# Patient Record
Sex: Male | Born: 1945 | Race: White | Hispanic: No | Marital: Married | Smoking: Never smoker
Health system: Southern US, Community
[De-identification: ages and names within clinical notes are randomized; demographics above are authoritative.]

## PROBLEM LIST (undated history)

## (undated) DIAGNOSIS — G8929 Other chronic pain: Secondary | ICD-10-CM

## (undated) DIAGNOSIS — G4733 Obstructive sleep apnea (adult) (pediatric): Secondary | ICD-10-CM

## (undated) DIAGNOSIS — R296 Repeated falls: Secondary | ICD-10-CM

## (undated) DIAGNOSIS — I440 Atrioventricular block, first degree: Secondary | ICD-10-CM

## (undated) DIAGNOSIS — R52 Pain, unspecified: Secondary | ICD-10-CM

## (undated) DIAGNOSIS — Z79891 Long term (current) use of opiate analgesic: Secondary | ICD-10-CM

## (undated) DIAGNOSIS — I10 Essential (primary) hypertension: Secondary | ICD-10-CM

## (undated) DIAGNOSIS — M450 Ankylosing spondylitis of multiple sites in spine: Secondary | ICD-10-CM

## (undated) DIAGNOSIS — R2681 Unsteadiness on feet: Secondary | ICD-10-CM

## (undated) DIAGNOSIS — G608 Other hereditary and idiopathic neuropathies: Secondary | ICD-10-CM

## (undated) DIAGNOSIS — T8859XA Other complications of anesthesia, initial encounter: Secondary | ICD-10-CM

## (undated) DIAGNOSIS — Z7289 Other problems related to lifestyle: Secondary | ICD-10-CM

## (undated) DIAGNOSIS — T4145XA Adverse effect of unspecified anesthetic, initial encounter: Secondary | ICD-10-CM

## (undated) DIAGNOSIS — R251 Tremor, unspecified: Secondary | ICD-10-CM

## (undated) DIAGNOSIS — G629 Polyneuropathy, unspecified: Secondary | ICD-10-CM

## (undated) DIAGNOSIS — G6289 Other specified polyneuropathies: Secondary | ICD-10-CM

## (undated) HISTORY — PX: ANKLE SURGERY: SHX546

## (undated) HISTORY — PX: JOINT REPLACEMENT: SHX530

## (undated) HISTORY — PX: NECK SURGERY: SHX720

## (undated) HISTORY — PX: TOTAL KNEE ARTHROPLASTY: SHX125

## (undated) HISTORY — DX: Polyneuropathy, unspecified: G62.9

## (undated) HISTORY — PX: APPENDECTOMY: SHX54

## (undated) HISTORY — PX: BACK SURGERY: SHX140

## (undated) HISTORY — PX: CHOLECYSTECTOMY: SHX55

---

## 1998-06-16 ENCOUNTER — Ambulatory Visit: Admission: RE | Admit: 1998-06-16 | Discharge: 1998-06-16 | Payer: Self-pay | Admitting: Internal Medicine

## 1998-08-23 ENCOUNTER — Encounter: Payer: Self-pay | Admitting: Neurological Surgery

## 1998-08-23 ENCOUNTER — Ambulatory Visit (HOSPITAL_COMMUNITY): Admission: RE | Admit: 1998-08-23 | Discharge: 1998-08-23 | Payer: Self-pay | Admitting: Neurological Surgery

## 1998-09-14 ENCOUNTER — Encounter: Admission: RE | Admit: 1998-09-14 | Discharge: 1998-12-13 | Payer: Self-pay | Admitting: Anesthesiology

## 1999-01-08 ENCOUNTER — Encounter: Admission: RE | Admit: 1999-01-08 | Discharge: 1999-04-07 | Payer: Self-pay | Admitting: Anesthesiology

## 1999-04-07 ENCOUNTER — Encounter: Admission: RE | Admit: 1999-04-07 | Discharge: 1999-07-06 | Payer: Self-pay | Admitting: Anesthesiology

## 1999-05-03 ENCOUNTER — Encounter: Admission: RE | Admit: 1999-05-03 | Discharge: 1999-05-20 | Payer: Self-pay | Admitting: Internal Medicine

## 1999-08-25 ENCOUNTER — Encounter: Admission: RE | Admit: 1999-08-25 | Discharge: 1999-11-23 | Payer: Self-pay | Admitting: Anesthesiology

## 1999-09-15 ENCOUNTER — Encounter: Payer: Self-pay | Admitting: Anesthesiology

## 1999-12-10 ENCOUNTER — Encounter: Admission: RE | Admit: 1999-12-10 | Discharge: 2000-03-09 | Payer: Self-pay | Admitting: Anesthesiology

## 2000-03-10 ENCOUNTER — Encounter: Admission: RE | Admit: 2000-03-10 | Discharge: 2000-06-08 | Payer: Self-pay | Admitting: Anesthesiology

## 2000-06-12 ENCOUNTER — Encounter: Admission: RE | Admit: 2000-06-12 | Discharge: 2000-09-10 | Payer: Self-pay | Admitting: Anesthesiology

## 2000-06-13 ENCOUNTER — Encounter: Payer: Self-pay | Admitting: Anesthesiology

## 2000-08-10 ENCOUNTER — Ambulatory Visit (HOSPITAL_COMMUNITY): Admission: RE | Admit: 2000-08-10 | Discharge: 2000-08-10 | Payer: Self-pay | Admitting: Anesthesiology

## 2000-08-10 ENCOUNTER — Encounter: Payer: Self-pay | Admitting: Anesthesiology

## 2000-10-05 ENCOUNTER — Encounter: Admission: RE | Admit: 2000-10-05 | Discharge: 2001-01-03 | Payer: Self-pay | Admitting: Anesthesiology

## 2001-02-22 ENCOUNTER — Encounter: Admission: RE | Admit: 2001-02-22 | Discharge: 2001-03-03 | Payer: Self-pay | Admitting: Anesthesiology

## 2002-01-30 ENCOUNTER — Ambulatory Visit (HOSPITAL_COMMUNITY): Admission: RE | Admit: 2002-01-30 | Discharge: 2002-01-30 | Payer: Self-pay

## 2003-01-03 ENCOUNTER — Encounter: Payer: Self-pay | Admitting: Orthopedic Surgery

## 2003-01-06 ENCOUNTER — Inpatient Hospital Stay (HOSPITAL_COMMUNITY): Admission: RE | Admit: 2003-01-06 | Discharge: 2003-01-10 | Payer: Self-pay | Admitting: Orthopedic Surgery

## 2003-01-08 ENCOUNTER — Encounter: Payer: Self-pay | Admitting: Orthopedic Surgery

## 2003-01-29 ENCOUNTER — Encounter: Admission: RE | Admit: 2003-01-29 | Discharge: 2003-04-04 | Payer: Self-pay | Admitting: Orthopedic Surgery

## 2004-08-06 ENCOUNTER — Ambulatory Visit: Payer: Self-pay | Admitting: Psychiatry

## 2004-08-06 ENCOUNTER — Emergency Department (HOSPITAL_COMMUNITY): Admission: EM | Admit: 2004-08-06 | Discharge: 2004-08-06 | Payer: Self-pay | Admitting: Emergency Medicine

## 2004-08-06 ENCOUNTER — Inpatient Hospital Stay (HOSPITAL_COMMUNITY): Admission: RE | Admit: 2004-08-06 | Discharge: 2004-08-09 | Payer: Self-pay | Admitting: Psychiatry

## 2005-08-26 ENCOUNTER — Emergency Department (HOSPITAL_COMMUNITY): Admission: EM | Admit: 2005-08-26 | Discharge: 2005-08-27 | Payer: Self-pay | Admitting: Emergency Medicine

## 2005-08-30 ENCOUNTER — Ambulatory Visit: Payer: Self-pay | Admitting: Pulmonary Disease

## 2006-04-11 ENCOUNTER — Inpatient Hospital Stay (HOSPITAL_COMMUNITY): Admission: RE | Admit: 2006-04-11 | Discharge: 2006-04-13 | Payer: Self-pay | Admitting: Neurological Surgery

## 2006-07-24 ENCOUNTER — Ambulatory Visit: Payer: Self-pay | Admitting: Physical Medicine & Rehabilitation

## 2006-07-24 ENCOUNTER — Encounter
Admission: RE | Admit: 2006-07-24 | Discharge: 2006-10-22 | Payer: Self-pay | Admitting: Physical Medicine & Rehabilitation

## 2007-05-08 LAB — PULMONARY FUNCTION TEST

## 2007-07-30 ENCOUNTER — Ambulatory Visit: Payer: Self-pay | Admitting: Pulmonary Disease

## 2007-07-30 ENCOUNTER — Ambulatory Visit (HOSPITAL_BASED_OUTPATIENT_CLINIC_OR_DEPARTMENT_OTHER): Admission: RE | Admit: 2007-07-30 | Discharge: 2007-07-30 | Payer: Self-pay | Admitting: Pulmonary Disease

## 2007-07-30 DIAGNOSIS — I10 Essential (primary) hypertension: Secondary | ICD-10-CM | POA: Insufficient documentation

## 2007-07-30 DIAGNOSIS — G894 Chronic pain syndrome: Secondary | ICD-10-CM | POA: Insufficient documentation

## 2007-07-30 DIAGNOSIS — R0609 Other forms of dyspnea: Secondary | ICD-10-CM | POA: Insufficient documentation

## 2007-07-30 DIAGNOSIS — R0989 Other specified symptoms and signs involving the circulatory and respiratory systems: Secondary | ICD-10-CM

## 2007-07-30 DIAGNOSIS — M459 Ankylosing spondylitis of unspecified sites in spine: Secondary | ICD-10-CM | POA: Insufficient documentation

## 2007-08-14 ENCOUNTER — Telehealth (INDEPENDENT_AMBULATORY_CARE_PROVIDER_SITE_OTHER): Payer: Self-pay | Admitting: *Deleted

## 2007-08-16 ENCOUNTER — Ambulatory Visit: Payer: Self-pay | Admitting: Pulmonary Disease

## 2007-08-20 ENCOUNTER — Telehealth (INDEPENDENT_AMBULATORY_CARE_PROVIDER_SITE_OTHER): Payer: Self-pay | Admitting: *Deleted

## 2007-08-20 ENCOUNTER — Ambulatory Visit: Payer: Self-pay | Admitting: Pulmonary Disease

## 2007-08-20 DIAGNOSIS — G4733 Obstructive sleep apnea (adult) (pediatric): Secondary | ICD-10-CM | POA: Insufficient documentation

## 2007-10-05 ENCOUNTER — Ambulatory Visit: Payer: Self-pay | Admitting: Pulmonary Disease

## 2007-11-05 ENCOUNTER — Inpatient Hospital Stay (HOSPITAL_COMMUNITY): Admission: RE | Admit: 2007-11-05 | Discharge: 2007-11-08 | Payer: Self-pay | Admitting: Orthopedic Surgery

## 2007-12-12 ENCOUNTER — Encounter: Payer: Self-pay | Admitting: Pulmonary Disease

## 2008-06-24 ENCOUNTER — Emergency Department (HOSPITAL_COMMUNITY): Admission: EM | Admit: 2008-06-24 | Discharge: 2008-06-24 | Payer: Self-pay | Admitting: Emergency Medicine

## 2008-07-01 ENCOUNTER — Ambulatory Visit (HOSPITAL_COMMUNITY): Admission: RE | Admit: 2008-07-01 | Discharge: 2008-07-02 | Payer: Self-pay | Admitting: Orthopedic Surgery

## 2008-12-08 ENCOUNTER — Emergency Department (HOSPITAL_COMMUNITY): Admission: EM | Admit: 2008-12-08 | Discharge: 2008-12-08 | Payer: Self-pay | Admitting: Emergency Medicine

## 2009-11-25 ENCOUNTER — Ambulatory Visit: Payer: Self-pay | Admitting: Pulmonary Disease

## 2009-12-02 ENCOUNTER — Encounter: Payer: Self-pay | Admitting: Pulmonary Disease

## 2010-08-03 NOTE — Letter (Signed)
Summary: [facility]  [facility]   Imported By: Lester Hubbard 12/08/2009 11:53:01  _____________________________________________________________________  External Attachment:    Type:   Image     Comment:   External Document

## 2010-08-03 NOTE — Assessment & Plan Note (Signed)
Summary: rov for osa   Primary Provider/Referring Provider:  Dr. Jennet Maduro  CC:  Pt is here for an overdue f/u appt.  Pt was last seen April 2009.  Pt states he is wearing his cpap machine every night.  Approx 9 hours per night.  Pt believes he is due for a new mask.  Pt denied any new concerns.  .  History of Present Illness: The pt comes in today for f/u of his known osa.  He is overdue for this visit, but feels that he is doing well.  He denies any mask or pressure issues, and feels that he sleeps well.  He is satisfied with his alertness during the day.  He is due for a new mask.  Current Medications (verified): 1)  Naproxen 500 Mg  Tabs (Naproxen) .... Take 1 Tablet By Mouth Two Times A Day 2)  Omeprazole 20 Mg  Tbec (Omeprazole) .... Take 1 Tablet By Mouth Once A Day 3)  Morphine Sulfate 30 Mg Tabs (Morphine Sulfate) .... Take 1 Tablet By Mouth Three Times A Day 4)  Lyrica 150 Mg Caps (Pregabalin) .... Take 1 Tablet By Mouth Three Times A Day 5)  Norvasc 10 Mg Tabs (Amlodipine Besylate) .... Take 1 Tablet By Mouth Once A Day 6)  Hyzaar 100mg  .... Take 1 Tablet By Mouth Once A Day 7)  Zoloft 50 Mg Tabs (Sertraline Hcl) .... Take 1 Tablet By Mouth Once A Day 8)  Ambien 10 Mg Tabs (Zolpidem Tartrate) .... Take 1 Tab By Mouth At Bedtime As Needed 9)  Remicade 100 Mg Solr (Infliximab) .... Infusion.  Take As Directed  Allergies (verified): 1)  ! Codeine  Review of Systems  The patient denies shortness of breath with activity, shortness of breath at rest, productive cough, non-productive cough, coughing up blood, chest pain, irregular heartbeats, acid heartburn, indigestion, loss of appetite, weight change, abdominal pain, difficulty swallowing, sore throat, tooth/dental problems, headaches, nasal congestion/difficulty breathing through nose, sneezing, itching, ear ache, anxiety, depression, hand/feet swelling, joint stiffness or pain, rash, change in color of mucus, and fever.     Vital Signs:  Patient profile:   65 year old male Height:      71 inches Weight:      255 pounds BMI:     35.69 O2 Sat:      99 % on Room air Temp:     98.3 degrees F oral Pulse rate:   81 / minute BP sitting:   158 / 80  (left arm) Cuff size:   regular  Vitals Entered By: Arman Filter LPN (Nov 25, 2009 9:38 AM)  O2 Flow:  Room air CC: Pt is here for an overdue f/u appt.  Pt was last seen April 2009.  Pt states he is wearing his cpap machine every night.  Approx 9 hours per night.  Pt believes he is due for a new mask.  Pt denied any new concerns.   Comments Medications reviewed with patient Arman Filter LPN  Nov 25, 2009 9:47 AM    Physical Exam  General:  ow male in nad Nose:  no skin breakdown or pressure necrosis from cpap mask Extremities:  mild edema, no cyanosis Neurologic:  alert and oriented, not sleepy, moves all 4.   Impression & Recommendations:  Problem # 1:  OBSTRUCTIVE SLEEP APNEA (ICD-327.23) the pt is wearing cpap compliantly, and feels he is sleeping well with adequate daytime alertness.  I have reminded him to keep up with  supplies and mask changes, and to work on getting his weight down.  He will call if any changes in his symptoms, and I will see him back in 1 year.  I will send an order to his DME to get his machine and mask checked.  Medications Added to Medication List This Visit: 1)  Morphine Sulfate 30 Mg Tabs (Morphine sulfate) .... Take 1 tablet by mouth three times a day 2)  Lyrica 150 Mg Caps (Pregabalin) .... Take 1 tablet by mouth three times a day 3)  Norvasc 10 Mg Tabs (Amlodipine besylate) .... Take 1 tablet by mouth once a day 4)  Hyzaar 100mg   .... Take 1 tablet by mouth once a day 5)  Zoloft 50 Mg Tabs (Sertraline hcl) .... Take 1 tablet by mouth once a day 6)  Ambien 10 Mg Tabs (Zolpidem tartrate) .... Take 1 tab by mouth at bedtime as needed 7)  Remicade 100 Mg Solr (Infliximab) .... Infusion.  take as directed  Other  Orders: Est. Patient Level III (16109) DME Referral (DME)  Patient Instructions: 1)  continue on cpap 2)  work on weight loss 3)  will send an order to [facility] for supplies 4)  followup with me in one year.

## 2010-11-16 NOTE — Op Note (Signed)
NAME:  [name], [name] NO.:  [id number]   MEDICAL RECORD NO.:  [id number]          PATIENT TYPE:  OIB   LOCATION:  1333                         FACILITY:  [facility]   PHYSICIAN:  Leonides Grills, M.D.     DATE OF BIRTH:  10/20/1945   DATE OF PROCEDURE:  07/01/2008  DATE OF DISCHARGE:  07/02/2008                               OPERATIVE REPORT   PREOPERATIVE DIAGNOSIS:  Right SER (supination-external rotation) or  equivalent lateral malleolus fracture.   POSTOPERATIVE DIAGNOSIS:  Right SER (supination-external rotation) or  equivalent lateral malleolus fracture.   OPERATIONS:  1. Open reduction internal fixation, right lateral malleolus fracture.  2. Stress x-rays, right ankle.   ANESTHESIA:  General.   SURGEON:  Leonides Grills, M.D.   ASSISTANT:  None.   COMPLICATIONS:  None.   DISPOSITION:  Stable to the PR.   ANESTHESIA:  General with spinal.   INDICATIONS:  This is a 65 year old male who slipped and fell and  sustained the above injury.  He was consented for the above procedure.  All risks of infection, nerve or vessel injury, nonunion, malunion,  hardware rotation, hardware failure, persistent pain, worsening pain,  prolonged recovery, stiffness, arthritis, scar tissue formation, the  possibility of impingement, the possibility of future scope were all  explained.  Questions were encouraged and answered.   OPERATION:  The patient was brought to the OR and placed in supine  position after adequate general endotracheal anesthesia was  administered, as well as Ancef 1 gram IV piggyback.  The patient  underwent a spinal anesthesia.  Ancef 1 gram IV piggyback was given and  light general.  A bump was placed under the right ipsilateral hip,  internally rotating the right lower extremity.  The right lower  extremity was prepped and draped in a sterile manner.  A proximally  placed thigh tourniquet was __________ exsanguinated.  The tourniquet  was elevated to  290 mmHg.  A longitudinal incision over the right  lateral malleolus was then made.  Dissection was carried down directly  to bone as a full-thickness flap.  The fracture site was then entered.  Soft tissue was carefully mobilized around the fracture site and soft  tissue as well as hematoma was removed from the fracture site itself.  There was a separate fracture fragment attached to the syndesmosis  anteriorly and this was teased off for later fixation.  I then  anatomically reduced the fracture and applied a 5-hole one-third tubular  plate in an antiglide configuration.  With the fracture site reduced, we  then placed three proximal 3.5-mm fully-threaded cortical set screws and  then two interfragmentary screws through the plate using a 1.6-XW drill  hole respectively.  This had excellent purchase and maintenance of the  reduction.  We then anatomically reduced the loose fragment anteriorly  and then held this reduced with a two-point reduction clamp.  We then  fixed this with a 3.5-mm fully-threaded cortical set screw using a 2.5-  mm drill hole respectively.  This had excellent purchase and maintenance  of the reduction.   The tourniquet was deflated and hemostasis was obtained.  Stress x-rays  were obtained, AP and lateral mortise views and showed no gross motion,  fixation, proper position, and excellent alignment as well.  No lateral  subluxation of the talus with any mortise or diastasis of the distal tip  or syndesmosis.  The area was copiously with normal saline.  Subcu was  closed with 3-0 Vicryl.  Skin was closed with 4-0 nylon.  A sterile  dressing was applied.  A modified Jones dressing was applied __________.  The patient was stable to the PR.      Leonides Grills, M.D.  Electronically Signed     PB/MEDQ  D:  07/01/2008  T:  07/02/2008  Job:  [id number]

## 2010-11-16 NOTE — Procedures (Signed)
NAME:  [name], [name] NO.:  [id number]   MEDICAL RECORD NO.:  [id number]          PATIENT TYPE:  OUT   LOCATION:  SLEEP CENTER                 FACILITY:  [facility]   PHYSICIAN:  Barbaraann Share, MD,FCCPDATE OF BIRTH:  01[birth year]01-[birth year]   DATE OF STUDY:  07/30/2007                            NOCTURNAL POLYSOMNOGRAM   REFERRING PHYSICIAN:   LOCATION:  Sleep lab.   REFERRING PHYSICIAN:  Marcelyn Bruins, M.D.   INDICATIONS FOR THE STUDY:  Hypersomnia with sleep apnea.   EPWORTH SLEEPINESS SCORE:  2.   SLEEP ARCHITECTURE:  The patient was found to have a total sleep time  during the night of 323 minutes.  He had very little REM until the  titration portion of the study and also very little slow wave sleep.  Sleep onset latency was prolonged at 75 minutes, and REM onset did not  occur until the titration portion of the study.  Sleep efficiency was  very poor and between 54% during the diagnostic portion of the study and  79% during the titration portion of the study.   RESPIRATORY DATA:  Patient underwent a split-night study where he was  found to have 114 obstructive events in the first 103 minutes of sleep.  This gave him an apnea-hypopnea index in the diagnostic portion of 67  events per hour.  The patient was noted to have snoring, but it was not  quantified by the sleep technician.  The events were not positional.  By  protocol, the patient was placed on a CPAP mask with a large Quattro  full face.  CPAP was initiated and ultimately titrated to 18 cmH2O  pressure with occasional breakthrough events.  It should be noted the  patient never achieved REM on the final CPAP setting.   OXYGEN DATA:  His O2 desaturation was as low as 86% with the patient's  obstructive events.  These resolved with CPAP.   CARDIAC DATA:  No clinically significant arrhythmias were noted.   MOVEMENT-PARASOMNIA:  None.   IMPRESSIONS-RECOMMENDATIONS:  Severe obstructive sleep apnea/hypopnea  syndrome with an apnea-hypopnea index of 67 events per hour during the  diagnostic portion of the study and oxygen desaturation as low as 86%.  Patient was then placed on CPAP with a large Quattro full face mask, and  pressure was ultimately titrated to  18 cmH2O with fairly good control.  The patient did not achieve rapid  eye movements (REM) on the final CPAP setting.      Barbaraann Share, MD,FCCP  Diplomate, American Board of Sleep  Medicine  Electronically Signed     [facility]/MEDQ  D:  08/16/2007 15:08:03  T:  08/17/2007 20:49:17  Job:  [id number]

## 2010-11-16 NOTE — Op Note (Signed)
NAME:  [name] NO.:  [id number]   MEDICAL RECORD NO.:  [id number]          PATIENT TYPE:  INP   LOCATION:  0002                         FACILITY:  [facility]   PHYSICIAN:  Ollen Gross, M.D.    DATE OF BIRTH:  April 23, 1946   DATE OF PROCEDURE:  11/05/2007  DATE OF DISCHARGE:                               OPERATIVE REPORT   PREOPERATIVE DIAGNOSIS:  Osteoarthritis left knee.   POSTOPERATIVE DIAGNOSIS:  Osteoarthritis left knee.   PROCEDURE:  Left total knee arthroplasty.   SURGEON:  Dr. Lequita Halt.   ASSISTANT:  Oneida Alar, PA-C.   ANESTHESIA:  Spinal with Duramorph.   ESTIMATED BLOOD LOSS:  Minimal.   DRAINS:  None.   TOURNIQUET TIME:  41 minutes at 300 mmHg.   COMPLICATIONS:  None.   CONDITION:  Stable to recovery.   BRIEF CLINICAL NOTE:  [name] is a 65 year old male with end-stage  arthritis of the left knee with progressively worsening pain and  dysfunction.  He has failed nonoperative management and presents now for  total knee arthroplasty.   PROCEDURE IN DETAIL:  After successful administration of spinal  anesthetic, a tourniquet was placed high on the left thigh and the left  lower extremity prepped and draped in the usual sterile fashion.  The  extremity was wrapped in an Esmarch, knee flexed and tourniquet inflated  to 300 mmHg.  A midline incision was made with a 10 blade through  subcutaneous tissue to the level of the extensor mechanism.  A fresh  blade was used make a medial parapatellar arthrotomy.  The soft tissue  over the proximal medial tibia was subperiosteally elevated to the joint  line with the knife and into the semimembranosus bursa with a Cobb  elevator.  Soft tissue laterally was elevated with attention being paid  to avoid the patellar tendon on the tibial tubercle.  The patella  subluxed laterally and the knee flexed 90 degrees.  ACL and PCL removed.  A drill was used to create a starting hole in the distal femur and the  canal was thoroughly irrigated.  The 5 degree left valgus alignment  guide was placed, referencing the posterior condyle.  Rotation was  marked and a block pinned to remove 11 mm of the distal femur.  I took  11 because of a preop flexion contracture.  Distal femur resection was  made with an oscillating saw.  A sizing block was placed.  A size 4 was  the most appropriate.  Rotation was marked at the epicondylar axis.  A  size 4 cutting block was placed and the anterior-posterior and chamfer  cuts were made.   The tibia was subluxed forward and menisci removed.  Extramedullary  tibial alignment guide was placed, referencing proximally at the medial  aspect of the tibial tubercle and distally along the second metatarsal  axis and tibial crest.  Block was pinned to remove 10 mm of the non-  deficient lateral side.  Tibial resection was made with an oscillating  saw.  A size 5 was the most appropriate tibial component and the  proximal tibia was prepared with the  modular drill and keel punch for a  size 5.  Femoral preparation was completed with the intercondylar cut  with a size 4.   A size 5 mobile bearing tibial trial, a size 4 posterior stabilized  femoral trial and then a 10 mm posterior stabilized rotating platform  insert trial was placed.  Full extension was achieved with excellent  varus, valgus, anterior and posterior balance through a full range of  motion.  The patella was then everted and thickness measured to be 25  mm.  Freehand resection was taken to 15 mm, 38 template was placed, lug  holes were drilled and trial patella placed.  The patella tracked  normally.  Osteophytes were removed off the posterior femur with the  trial in place.  All trials were removed and the cut bone surfaces were  prepared with pulsatile lavage.  Cement was mixed and once ready for  implantation, the size 5 mobile bearing tibial tray, size 4 posterior  stabilized femur and 38 patella were  cemented into place and the patella  was held with a clamp.  Trial 10-mm insert was placed, the knee held in  full extension and all extruded cement removed.  When the cement was  fully hardened, then the permanent 10 mm posterior stabilized rotating  platform insert was placed in the tibial tray.  The wound was copiously  irrigated with saline solution and then FloSeal injected on the  posterior capsule, medial and lateral gutters and suprapatellar area.  A  moist sponge was placed and tourniquet released with a total time of 41  minutes.  The sponge was held for 2 minutes and then removed.  Minimal  bleeding was encountered.  That which was encountered was stopped with  electrocautery.  The wound was again irrigated with saline and the  extensor mechanism closed with interrupted #1 PDS.  Flexion against  gravity was 135 degrees.  The subcu closed with interrupted 2-0 Vicryl  and subcuticular running 4-0 Monocryl.  The incision was clean and dry  and Steri-Strips and a bulky sterile dressing applied.  He was then  awakened and transferred to recovery in stable condition.      Ollen Gross, M.D.  Electronically Signed     FA/MEDQ  D:  11/05/2007  T:  11/05/2007  Job:  [id number]

## 2010-11-16 NOTE — H&P (Signed)
NAME:  THADIUS, SMISEK NO.:  [id number]   MEDICAL RECORD NO.:  [id number]          PATIENT TYPE:  INP   LOCATION:  NA                           FACILITY:  [facility]   PHYSICIAN:  Ollen Gross, M.D.    DATE OF BIRTH:  Aug 15, 1945   DATE OF ADMISSION:  11/05/2007  DATE OF DISCHARGE:                              HISTORY & PHYSICAL   CHIEF COMPLAINT:  Left knee pain.   HISTORY OF PRESENT ILLNESS:  The patient is a 65 year old male well  known by Dr. Homero Fellers Aluisio.  He has previously undergone a right total  knee back in July 2004.  He continues to have problems with his left  knee, has known end-stage arthritis which has been progressively getting  worse.  He has been treated conservatively in the past including  injections and now is ready for undergoing surgery risks and benefits  have been discussed.  He elects to proceed with surgery.  He does have a  letter that accompanies him from the anesthesiology department over at  [facility] and states that he is a difficult  intubation.  The patient admitted to the hospital.   ALLERGIES:  CODEINE.   CURRENT MEDICATIONS:  Morphine, oxycodone, citalopram, naproxen,  omeprazole, lisinopril, trazodone.   PAST MEDICAL HISTORY:  1. Ankylosing spondylitis.  2. Degenerative disk disease.  3. Sleep apnea.  4. Hypertension.   PAST SURGICAL HISTORY:  1. Knee replacement.  2. Neck surgery.   SOCIAL HISTORY:  Married, works as a Visual merchandiser, now on disability.  Nonsmoker, occasional beer, not every day.  Three children.  Wife will  be assisting with care after surgery.   FAMILY HISTORY:  Mother with history of heart disease.  Father with  history of stroke.   REVIEW OF SYSTEMS:  GENERAL:  No fevers, chills, night sweats.  NEUROLOGIC:  No seizures, syncope or paralysis.  RESPIRATORY: No  shortness of breath, productive cough or hemoptysis.  CARDIOVASCULAR:  No chest pain, angina, orthopnea.  GI: No nausea,  vomiting, diarrhea or  constipation.  GU:  No dysuria, hematuria or discharge.  MUSCULOSKELETAL:  Left knee.   PHYSICAL EXAMINATION:  VITAL SIGNS:  Pulse 88, respirations 14, blood  pressure 154/82.  GENERAL:  A 65 year old white male, well nourished, well developed, no  acute distress, alert and oriented, cooperative.  HEENT:  Normocephalic, atraumatic.  Pupils round and reactive.  EOMs  intact.  NECK:  Supple.  CHEST:  Clear anterior posterior chest walls.  No rhonchi, rales or  wheezing.  HEART:  Regular rate and rhythm.  No murmur, S1-S2 noted.  ABDOMEN:  Soft, slightly round.  Bowel sounds present.  RECTAL, BREASTS, GENITALIA:  Not done, not pertinent to present illness.  EXTREMITIES:  Left knee marked crepitus, slight varus, malalignment  deformity.  Tender more medial than lateral.   IMPRESSION:  1. Osteoarthritis, left knee.  2. Difficult intubation-difficult airway.  3. Ankylosing spondylitis.  4. Degenerative disk disease.  5. Sleep apnea.  6. Hypertension.   PLAN:  Patient admitted to [facility] to undergo a left total  knee replacement arthroplasty.  Surgery will be  performed by Dr. Ollen Gross.      Alexzandrew L. Perkins, P.A.C.      Ollen Gross, M.D.  Electronically Signed    ALP/MEDQ  D:  11/04/2007  T:  11/04/2007  Job:  [id number]   cc:   Dr. Richardson Dopp, [place], [facility]   Jennet Maduro, MD, [facility]  [address]  [place], [place], [place] [address]  Valinda Hoar [phone]

## 2010-11-19 NOTE — H&P (Signed)
[facility]  Patient:    [name], [name]                 MRN: [id number] Adm. Date:  [id number] Attending:  Thyra Breed CC:         ________________,  ________________, [facility]   History and Physical  FOLLOWUP EVALUATION:  [name] comes in for followup evaluation of his ankylosing spondylitis.  He is having more stiffness in his neck and is noting difficulties with swallowing, especially if he flexes his neck forward; he also feels as though his airway is being cut off when he flexes forward.  On his previous x-ray a year ago, he did have an anterior osteophyte at C3-4.  He ran out of his Zestril last week and his blood pressure is somewhat up today as a result of this.  He continues on the methadone, the Arthrotec, the Celexa.  He notes that he is having a lot of stiffness and I asked him about whether he has been on Baclofen in the past; I cannot see that I have written for it.  EXAMINATION  VITAL SIGNS:  Blood pressure 174/78, heart rate 72, respiratory rate is 18, O2 saturation is 97% and pain level is 7/10.  MUSCULOSKELETAL:  He has marked restriction of range of motion of his neck with occiput-to-wall distance limited to 8-1/2 inches from the wall.  Chest wall expansion was three-quarters of an inch.  Deep tendon reflexes were symmetric in the upper and lower extremities with two to three beats of clonus at his ankles.  IMPRESSION 1. Dysphagia and airway problems when he leans forward.  I suspect he has an    anterior osteophyte over his cervical spine. 2. Ankylosing spondylitis with increased stiffness. 3. Hypertension, per doctors at the [facility]. 4. Depression, currently controlled on Celexa.  DISPOSITION 1. Continue methadone 10 mg 1 p.o. q.6h., #120 with no refill. 2. Continue on Arthrotec 75 mg 1 p.o. b.i.d., #60 with 2 refills. 3. Zestril 1 p.o. q.d., #30 with 2 refills. 4.  Celexa 40 mg in the morning with 20 mg in the morning, 30 tablets of each    with refills x 2. 5. Trial of Baclofen 10 mg 1/2 tablet p.o. q.8h., #50 with 2 refills.  He is    aware of the side-effects of this. 6. X-rays of the cervical spine to ascertain whether he has had any change in    the size of his osteophyte. 7. Follow up with me in four to eight weeks. DD:  06/12/00 TD:  06/12/00 Job: [id number]

## 2010-11-19 NOTE — Consult Note (Signed)
[facility]  Patient:    [name], [name]                 MRN: [id number] Proc. Date: 12/10/99 Adm. Date:  [id number] Attending:  Thyra Breed CC:         Stefani Dama, M.D.             Paulita Cradle, M.D.             Lemmie Evens, M.D.                          Consultation Report  FOLLOWUP EVALUATION:  The patient comes in for a followup evaluation of his ankylosing spondylosis.  Since his previous evaluation, the patient has noted that he is having ongoing problems with bilateral hip discomfort, knee discomfort, right greater than left, as well as neck discomfort and lower back discomfort.  He does note that the methadone at the current dose is helping to reduce his pain to a significant extent and he rates his pain at 6/10.  He did not feel as though the prednisone taper was of much benefit.  He continues on the Celexa and Arthrotec.  He seems to be tolerating the medications well.  he takes Zestril for his hypertension.  EXAMINATION:  VITAL SIGNS:  Blood pressure 138/74, heart rate is 96, respiratory rate 16, O2 saturation is 97%, pain level is 6/10 and temperature is 98.0.  BACK:  He continues to have very restricted range of motion of his lumbosacral region with occiput to wall and chest wall expansion unchanged from previously.  NEUROLOGIC:  Exam is unchanged with deep tendon reflexes symmetric.  IMPRESSION: 1. Diffuse spondylosis with underlying ankylosing spondylitis, for which he    has seen Dr. Lemmie Evens in the past. 2. Hypertension, per ______ .  DISPOSITION: 1. Continue on methadone 5 mg one p.o. q.6h., #120 with no refill. 2. Continue Celexa. 3. Arthrotec 75 mg one p.o. b.i.d., #60 with four refills. 4. We discussed the possibility of doing a cervical epidural steroid injection    at his next visit, which he is going to contemplate. 5. Follow up with me in four weeks. DD:  12/10/99 TD:  12/11/99 Job:  [id number]

## 2010-11-19 NOTE — Consult Note (Signed)
[facility]  Patient:    [name], [name]                 MRN: [id number] Proc. Date: 03/13/00 Adm. Date:  [id number] Attending:  Thyra [name] CC:         [name], M.D.  [name], M.D.   Consultation Report  FOLLOWUP EVALUATION:  [name] comes in for followup evaluation of his ankylosing spondylitis.  Since his previous evaluation, the patient notes that he is having more problems with what sounds like inertia when he gets up.  He just does not have the energy to get up and go and he feels like this may be related to his underlying depression.  He continues to have a lot of stiffness.  He does not note that there is any better pain control with methadone 40 mg a day versus his previous dose.  He continues on the Arthrotec.  His laboratory investigations including a CBC and electrolyte counts were reviewed and normal.  CURRENT MEDICATIONS:  Methadone, Arthrotec, Zestril and Celexa at 40 mg a day.  EXAMINATION  VITAL SIGNS:  Blood pressure 153/70, heart rate is 84, respiratory rate is 16, O2 saturation is 96%, pain level is 7/10 and temperature is 98.6.  NEUROLOGIC:  The patient demonstrated symmetric deep tendon reflexes with two to three beats of clonus.  Range of motion of his neck is severely restricted. He has ongoing restrictions in range of motion of his chest wall.  IMPRESSION 1. Ankylosing spondylitis with no overall improvement from previously on    increased dose of methadone. 2. Depression. 3. Hypertension, per [name].  DISPOSITION 1. Increase Celexa to 60 mg a day, to take 40 mg a day with a 20 mg per day,    for which 30 of each were written. 2. Continue on current dose of Arthrotec. 3. Try to reduce his methadone to 10 mg q.8h. and see if this makes a    difference.  If he is no worse over the next five to seven days, he was    advised to stay on one tablet three times a day. 4. I plan to see  the patient back on followup in four weeks. DD:  03/13/00 TD:  03/13/00 Job: [id number]

## 2010-11-19 NOTE — Consult Note (Signed)
[facility]  Patient:    [name], [name]                 MRN: [id number] Proc. Date: 04/13/00 Adm. Date:  [id number] Attending:  Thyra Breed CC:         Paulita Cradle, M.D.   Consultation Report  FOLLOWUP EVALUATION:  Mr. Boxx comes in for followup evaluation of his ankylosing spondylitis with associated limitations in range of motion of his neck, thoracic and lumbar spine, as well as his hips and knees.  He has a lot of stiffness and he has noted that reducing the methadone to three tablets a day has resulted in increased discomfort.  He characterizes this as more stiffness and as he gets more stiff, he is less active and as he is less active, he gets progressively stiffer.  He has noted that going up on the Celexa has not significantly changed his depression.  He continues on the Arthrotec without untoward effect.  EXAMINATION  VITAL SIGNS:  Blood pressure 137/72.  Heart rate is 70.  Respiratory rate is 16.  O2 saturation is 99%.  Pain level is 7/10.  NEUROLOGIC:  He exhibited symmetric deep tendon reflexes in the upper and lower extremities with range of motion unchanged from previously.  He does have increased pain on rotation of his neck in either direction to about 25 degrees.  IMPRESSION 1. Ankylosing spondylitis. 2. Depression. 3. Hypertension per Dr. Bevelyn Buckles.  DISPOSITION 1. Reduce Celexa to 40 mg per day. 2. Increase the methadone to 10 mg 1 p.o. q.6h., #120 with no refill. 3. Continue on current dose of Arthrotec. 4. Follow up with me in four to eight weeks. 5. The patient was encouraged to try apple cider vinegar with honey to see if    this improved his pain. DD:  04/13/00 TD:  04/14/00 Job: [id number]

## 2010-11-19 NOTE — H&P (Signed)
[facility]  Patient:    [name], [name]                 MRN: [id number] Adm. Date:  [id number] Attending:  Thyra Breed CC:         Stefani Dama, M.D.  ______________, [facility]   History and Physical  FOLLOWUP EVALUATION:  [name] comes in for followup evaluation of his ankylosing spondylitis and chronic low back pain on the basis of this.  Since his last evaluation, his speech continues to be a little more raspy and he continues to have intermittent swallowing problems.  He is most concerned about his failure to have erections, which has been most predominant over the past couple of months, and we discussed this in detail today.  I advised him that I would like for him to go ahead and stop the Baclofen, since his erectile dysfunction seemed to correlate with this, but he tells me he has had dysfunction over a year and it may be related to his underlying Celexa, which will need to be tapered off, as I explained to him, before we could start something like Effexor.  He wants to try and go up on his methadone today.  He is having a lot of lower back stiffness.  He is currently on methadone 10 mg one p.o. q.6h., Arthrotec, Zestril, Celexa 20 mg in the morning and 40 mg at night and Baclofen 10 mg t.i.d.  PHYSICAL EXAMINATION  VITAL SIGNS:  Blood pressure is 138/84, heart rate is 67, respiratory rate is 18, O2 saturation is 96%, pain level is 7/10 and temperature is 97.8.  BACK:  Range of motion of his back is virtually unchanged from before.  NEUROLOGIC:  His neuro exam is unchanged.  He has no clonus.  IMPRESSION 1. Ankylosing spondylitis with some swallowing dysfunctions associated with    this. 2. Hypertension per [facility].  DISPOSITION 1. Increase methadone to 10 mg 1 p.o. q.4h. x 5 during the day, #150 with no    refill. 2. Stop Baclofen. 3. MRI of the cervical spine. 4. Follow up with me in four  weeks. DD:  08/09/00 TD:  08/10/00 Job: [id number]

## 2010-11-19 NOTE — H&P (Signed)
NAME:  [name], [name]                    ACCOUNT NO.:  [id number]   MEDICAL RECORD NO.:  [id number]                   PATIENT TYPE:  INP   LOCATION:  0476                                 FACILITY:  [facility]   PHYSICIAN:  Ollen Gross, M.D.                 DATE OF BIRTH:  25-Jul-1945   DATE OF ADMISSION:  01/06/2003  DATE OF DISCHARGE:                                HISTORY & PHYSICAL   CHIEF COMPLAINT:  Bilateral knee pain.   HISTORY OF PRESENT ILLNESS:  The patient is a 65 year old male who has been  seen by Dr. Ollen Gross for bilateral knee pain.  He has a several year  history of progressive knee pain, the right knee is more symptomatic then  the left.  The patient states that he has pain all the time and that it  hurts throughout the day and as well at night.  The right knee again is more  symptomatic.  He denies any hip pain.  He does have some chronic low back  pain.  He has been [facility] by [facility]ra [facility].  He is  seen in the office and x-rays are found to have significant medial and  patellofemoral bone-on-bone changes in the right knee with near bone-on-bone  changes of the medial and patellofemoral compartment of the left knee.  He  has had bilateral knee pain on a chronic basis.  It is felt he has reached a  point where he could benefit from undergoing total knee replacement.  Risks  and benefits of the procedure have been discussed with the patient.  He has  elected to proceed with surgery.   ALLERGIES:  No known drug allergies.   INTOLERANCES:  Codeine causes nausea and sickness.   CURRENT MEDICATIONS:  1. Celexa 40 mg.  2. Bextra 20 mg.  3. Zestril 10 mg.  4. Methadone 5 mg.  5. Kadian 100 mg.   PAST MEDICAL HISTORY:  1. Hypertension.  2. Degenerative disk disease.  3. Chronic low back pain.   PAST SURGICAL HISTORY:  1. Right wrist surgery.  2. ORIF of the mandible.   FAMILY HISTORY:  Mother living, age 77, with a history of  MI.  Father  deceased at age 32, with a history of stroke.   SOCIAL HISTORY:  Married.  Self-employed farmer.  Nonsmoker.  He does have  about 6 to 12 beers consumption daily.  He has three children.  His wife  will be assisting with care after surgery.   REVIEW OF SYSTEMS:  GENERAL:  No fevers, chills, or night sweats.  NEUROLOGIC:  No seizures, syncope, or paralysis.  RESPIRATORY:  No shortness  of breath, productive cough, or hemoptysis.  CARDIOVASCULAR:  No chest pain,  angina, or orthopnea.  GASTROINTESTINAL:  No nausea, vomiting, diarrhea,  constipation.  GENITOURINARY:  No dysuria, hematuria, or discharge.  MUSCULOSKELETAL:  Pertinent to the bilateral knees found  in the history of  present illness.   PHYSICAL EXAMINATION:  VITAL SIGNS:  Pulse 78, respirations 14, blood  pressure 134/84.  GENERAL:  The patient is a 65 year old white male, well-developed, well-  nourished, appears in no acute distress.  He is alert and oriented and  cooperative.  He is accompanied by his wife.  HEENT:  Normocephalic, atraumatic.  Pupils are round and reactive.  EOM's  intact.  NECK:  Supple.  CHEST:  Clear to auscultation, no wheezes, rhonchi, or rales.  HEART:  Regular rate and rhythm, no murmur.  ABDOMEN:  Soft, round abdomen, nontender, bowel sounds present.  RECTAL:  Not done, not pertinent to present illness.  BREASTS:  Not done, not pertinent to present illness.  GENITALIA:  Not done, not pertinent to present illness.  EXTREMITIES:  Significant to that of the right lower extremity.  Range of  motion is about 10 to about 100 degrees.  He does have slight varus  deformity noted.  There is no instability.  No signs of infection.  Left  knee shows range of motion of 10 to 95 degrees, no instability, no signs of  infection.   IMPRESSION:  1. Bilateral knee osteoarthritis, right more symptomatic then left.  2. Hypertension.  3. Chronic low back pain.   PLAN:  The patient will be admitted  to [facility] to undergo right  total knee arthroplasty.  Surgery will be performed by Dr. Ollen Gross.     Alexzandrew L. Julien Girt, P.A.              Ollen Gross, M.D.    ALP/MEDQ  D:  01/07/2003  T:  01/07/2003  Job:  [id number]

## 2010-11-19 NOTE — H&P (Signed)
[facility]  Patient:    [name], [name]                 MRN: [id number] Adm. Date:  [id number] Attending:  Thyra Breed CC:         Stefani Dama, M.D.  Birdena Jubilee, P.A. at [facility]   History and Physical  FOLLOW-UP EVALUATION:  [name] comes in for follow-up evaluation of his ankylosing spondylitis. Since his last evaluation, the patient has had a transition over the Duragesic and has noted that for a week he had severe pains in the joints of his legs and stiffness and hip discomfort, but this has seemed to have attenuated. He does not feel as though the Duragesic is covering the pain quite as bad, but his libido has returned. He feels somewhat better overall from the side effect profile. He rates his pain at 7/10. He has a lot of stiffness in his neck and lower back.  CURRENT MEDICATIONS:  Zestril, Celexa, Bextra 10 mg once a day, and Duragesic 50 mcg one every 3 days.  PHYSICAL EXAMINATION:  VITAL SIGNS:  Blood pressure is 145/84, heart rate is 97, respiratory rate is 18, O2 saturation is 98%, pain level is 7/10.  NEUROLOGICAL:  His joints demonstrated no change in range of motion. Deep tendon reflexes were symmetric. Gait is intact. He continues to have very limited range of motion of his neck and lower back.  IMPRESSION: 1. Ankylosing spondylitis. 2. Dysphagia reflective of anterior osteophytes for which he has been    evaluated by Stefani Dama, M.D. 3. Hypertension per primary care physician.  DISPOSITION: 1. Increase Duragesic to 75 mcg one every 3 days, #10. 2. Increase Bextra to two tablets. 3. Continued reduction in alcohol consumption. 4. He continues not to be interested in any psychologic counseling. 5. Follow up with me in 4 weeks. DD:  11/03/00 TD:  11/05/00 Job: [id number]

## 2010-11-19 NOTE — H&P (Signed)
[facility]  Patient:    [name], [name]                 MRN: [id number] Adm. Date:  [id number] Attending:  Thyra Breed CC:         Stefani Dama, M.D.  Dr. Paulita Cradle   History and Physical  FOLLOW-UP EVALUATION:  [name] comes in for follow-up evaluation of his ankylosing spondylitis pain.  Since his last evaluation, he has noted that the Duragesic patches only last a couple of days, and by the third day he is in a lot of discomfort.  It stimulates his appetite.  He has gained about 10 pounds.  He has also noted that he has been able to cut back on his beer consumption.  He is down to about three to four beers per night.  He continues with the Bextra 10 mg two tablets per day, which he says he is tolerating well, Celexa, and Zestril.  He complains of neck pain with intermittent tingling-type sensations in the neck and in the lower extremities.  PHYSICAL EXAMINATION:  VITAL SIGNS:  Blood pressure is 145/87, heart rate is 81, respiratory rate is 18, O2 saturation is 96%, pain level is 7 out of 10.  MUSCULOSKELETAL/NEUROLOGIC:  He shows no active synovitis.  He demonstrates no peripheral spasticity and no clonus, and gait is intact.  Deep tendon reflexes were symmetric.  He continues to have severe restriction in the range of motion of his neck and lower back.  IMPRESSION: 1. Ankylosing spondylitis. 2. Dysphagia. 3. Hypertension.  DISPOSITION: 1. Continue Duragesic but decrease the frequency to every two days, #15 at    75 mcg. 2. Continue on Bextra at current dose. 3. Continue to reduce alcohol consumption. 4. Follow up with me in four to five weeks. 5. Scopolamine patch one every three days. DD:  12/01/00 TD:  12/01/00 Job: [id number]

## 2010-11-19 NOTE — H&P (Signed)
NAME:  [name], [name] NO.:  [id number]   MEDICAL RECORD NO.:  [id number]          PATIENT TYPE:  IPS   LOCATION:  0305                          FACILITY:  [facility]   PHYSICIAN:  Geoffery Lyons, M.D.      DATE OF BIRTH:  January 21, 1946   DATE OF ADMISSION:  08/06/2004  DATE OF DISCHARGE:                         PSYCHIATRIC ADMISSION ASSESSMENT   This is a voluntary admission.  This is a 65 year old married white male.  He presented to the emergency room yesterday requesting detox from alcohol,  as his pain center had stopped prescribing his medications.  He denies  depression.  He states he is not a drunk; he just has pain, and he has not  slept in a month now.  In verifying his medications with his pharmacy,  [facility], they noted that as per his information, he was put on an  MS Contin taper beginning in January.  Apparently, that medication should  have finished yesterday.   His normally prescribed medications are:  1.  Duragesic patch 100 mg every 2 days; that was last filled at [facility] on      May 04, 2004.  2.  Kadian 100 mg q.12h.; that was last filled on June 03, 2004.  3.  Methadone 10 mg every 8 hours, that was last filled on June 03, 2004.   He is also prescribed during the day:  1.  Celexa 40 mg.  2.  Mobic 15.  3.  Zestril 10 mg p.o. q.d.   He states that the conflict began when he acknowledged that it is his usual  and customary practice to drink 2-3 beers while preparing dinner every  night.  Apparently, the prescribing physician, a Dr. Arlys John, decided that he  did not want to continue prescribing these pain medications if the patient  was going to continue with his habit of drinking beer.  Of note, his alcohol  level in the emergency room was 6.  His urine drug screen was negative.  He  gives no indication for withdrawal from alcohol.   PAST PSYCHIATRIC HISTORY:  He has none.   SOCIAL HISTORY:  He finished the 12th grade.  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  He does receive income  from [facility] for back pain.  He has been married 33 years.  He has 3 children -  a son 54, a son 69 and a daughter 45.   FAMILY HISTORY:  He denies any family history.   ALCOHOL AND DRUG HISTORY:  As already stated, he drinks approximately 2-3  beers while fixing dinner.   PRIMARY CARE Lilyanna Lunt:  Dr. Ernestina Penna.  He is followed for arthritis  and hypertension.   MEDICATIONS:  These have already been listed.   PHYSICAL EXAMINATION:  As per the ER.   MENTAL STATUS EXAMINATION:  He is alert and oriented x3.  He was  appropriately groomed and dressed.  His speech is a normal rate, rhythm  and  tone.  His mood is depressed and anxious.  His affect is congruent.  His  thought processes are clear, rational and goal-oriented; he wants to get his  pain medication restarted.  Judgment and insight are intact.  Concentration  and memory are intact.  His intelligence is at least average.  Substance  abuse - he does drink 2-3 beers in the evening, but it is not considered to  be an abuse issue.   AXIS I:  1.  Depression, not otherwise specified.  2.  Chronic pain induced depression.   AXIS II:  Deferred.   AXIS III:  1.  Chronic pain from arthritis.  2.  Hypertension.   AXIS IV:  None, other than being taking off his pain medications.   AXIS V:  Global assessment of functioning 50.   I reviewed his information with Dr. Katrinka Blazing.  The patient had requested that  his Duragesic patch and methadone be restarted.  Dr. Katrinka Blazing was in agreement  with this, and this will be done.  We will line him up with a different pain  clinic on discharge.  There is no need to detox from alcohol.      MD/MEDQ  D:  08/07/2004  T:  08/07/2004  Job:  [id number]

## 2010-11-19 NOTE — Discharge Summary (Signed)
NAME:  [name], MANUS NO.:  [id number]   MEDICAL RECORD NO.:  [id number]          PATIENT TYPE:  INP   LOCATION:  3010                         FACILITY:  [facility]   PHYSICIAN:  Stefani Dama, M.D.  DATE OF BIRTH:  1946-03-08   DATE OF ADMISSION:  04/11/2006  DATE OF DISCHARGE:  04/13/2006                                 DISCHARGE SUMMARY   ADMITTING DIAGNOSIS:  Diffuse idiopathic skeletal hyperostosis, status post  C1 ring fracture, intractable neck pain.   DISCHARGE AND FINAL DIAGNOSES:  1. Diffuse idiopathic skeletal hyperostosis, status post C1 ring fracture,      intractable neck pain.  2. Suspected alcohol dependence.   CONDITION ON DISCHARGE:  Improving.   HOSPITAL COURSE:  [name] is a 65 year old individual who has  had significant problems with chronic neck pain.  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  This was treated  conservatively and the patient seemed to be improving, however he has  developed a problem with chronic neck pain and headache which is  unrelenting.  He has auto-arthrodesis neck up to the level of C3, however  his occiput from C2 complex is extremely painful without any significant  movement.  There are some early bony osteophytes forming.  After careful  consideration of his options, I have offered the patient surgical  arthrodesis from the occiput to the C4.  This will indeed eliminate some  motion in his neck, however hopefully as he fuses the pain will subside  also.  He has become increasingly dependent on narcotic analgesics and at  the time of admission is taking oral morphine for pain relief in addition to  Percocet.  Apparently, he has also been drinking a fair amount fairly  regularly, he admits to three or four beers, however, although family  members admit that he drinks upwards of 18 to 20 beers a  day.  He was taken  to the operating room on the day of admission where he underwent surgical  stabilization from occiput to C4.  He tolerated the surgical procedure quite  well, however pain control was indeed a significant issue during the  postoperative course.  The patient has complained bitterly of significant  pain.  He has been treated with chronic morphine sulfate on his admitting  dose and he has had a PCA pump which was ineffective for controlling his  pain.  He is given oral Percocet with Valium and this seems to control his  pain fairly well.  On the second postoperative day, he is ambulating and he  is eager to be discharged.  The issue of substance abuse was discussed with  the patient, however he refused consultation with a Child psychotherapist and does  not feel he has a problem with this.  He is eager for discharge at this time  and he is given a prescription for Percocet #60 with no refills, Valium 5 mg  #60 with no refills.  He will be seen in a week's time for removal  of the  staples.  At the current time, his incision is clean and dry and he is  ambulatory.      Stefani Dama, M.D.  Electronically Signed     HJE/MEDQ  D:  04/13/2006  T:  04/14/2006  Job:  [id number]

## 2010-11-19 NOTE — Consult Note (Signed)
NAME:  [name], [name] NO.:  [id number]   MEDICAL RECORD NO.:  [id number]          PATIENT TYPE:  INP   LOCATION:  3010                         FACILITY:  [facility]   PHYSICIAN:  Michaelyn Barter, M.D. DATE OF BIRTH:  1946/05/05   DATE OF CONSULTATION:  DATE OF DISCHARGE:                                   CONSULTATION   PRIMARY CARE PHYSICIAN:  [facility].   This is an Archivist consultation for history of alcohol  abuse.   HISTORY OF PRESENT ILLNESS:  Mr. Spratt is a 65 year old gentleman with  past medical history of hypertension and chronic alcohol abuse/consumption.  He was admitted by Dr. Danielle Dess. He underwent an occiput to C4 fixation with  rod using the screw technique with arthrodesis as well as autograft and  allograft. This was done secondary to neck pain, DISH syndrome and C1  fracture per the immediate postoperative procedure note. The patient  currently is awake and states that he feels pretty good. He denies having  any current pain. He denies feeling tremulous. He denies having any signs or  symptoms suggestive of alcohol withdrawal. He also states that he has never  gone through withdrawal or DTs in the past.   PAST MEDICAL HISTORY:  1. Arthritis.  2. Hypertension.  3. Questionable alcohol detox documented in echart.  4. Chronic pain.  5. Depressive disorder.  6. Bilateral knee osteoarthritis.  7. Chronic low back pain.  8. Mild postoperative hyponatremia.  9. Dysphagia secondary to osteophytes in the cervical region.  10.Ankylosing spondylitis.  11.Impotence.   PAST SURGICAL HISTORY:  Right total knee arthroplasty.   ALLERGIES:  None.   HOME MEDICATIONS:  The patient does not provide.   SOCIAL HISTORY:  The patient admits to a history of alcohol abuse since  being in the [employer]. He states that over the past three months he has been  drinking up to 12 beers per day secondary to pain.   FAMILY:  Information not disclosed.   REVIEW OF SYSTEMS:  As per HPI.   PHYSICAL EXAMINATION:  The patient is awake. He is cooperative. He is no  obvious distress and does not show any obvious agitation.  VITALS:  Temperature 98.0, heart rate 97, respirations 18, blood pressure  149/74, O2 saturation 94% on room air.  HEENT:  Normocephalic, atraumatic, anicteric. Extraocular movements are  intact.  NECK:  Cervical collar is present.  CARDIAC:  S1 and S2 is present. Regular rate and rhythm.  RESPIRATORY:  Clear.  ABDOMEN:  Soft, nontender, nondistended, positive bowel sounds.  EXTREMITIES:  No leg edema.  NEUROLOGICAL:  The patient is alert and oriented x3.  MUSCULOSKELETAL:  5/5 upper and lower extremity strength.   LABORATORY DATA:  No new labs.   ASSESSMENT AND PLAN:  History of alcohol abuse. The patient does not  currently appear to be displaying any signs of alcohol withdrawal. Will  monitor him closely for now. If the patient does begin to manifest any signs  or symptoms of alcohol withdrawal, then would use the alcohol withdrawal  protocol. For now, will write for Librium to be  given on a p.r.n. basis only  and only if the patient does start to show any signs or symptoms of alcohol  withdrawal. In addition, will also add thiamine, multivitamin and folic  acid. The key at this particular time may be to discharge the patient as  soon as he is stable from a orthopedic point of view, so that he can return  to his regular routine and environment.  Pt doesn't express any interest in  sobreity currently. Will continue to follow.      Michaelyn Barter, M.D.  Electronically Signed     OR/MEDQ  D:  04/13/2006  T:  04/13/2006  Job:  [id number]

## 2010-11-19 NOTE — H&P (Signed)
[facility]  Patient:    [name], [name]                 MRN: [id number] Adm. Date:  [id number] Attending:  Thyra Breed CC:         Birdena Jubilee, M.D.             Lemmie Evens, M.D.             Stefani Dama, M.D.                         History and Physical  FOLLOW-UP EVALUATION  HISTORY:  Mr. Keir comes in for follow up evaluation of his ankylosing spondylitis.  Since his previous evaluation, the patient has noted ongoing stiffness and pain.  He is tolerating the methadone 10 mg t.i.d., Arthrotec 75 mg b.i.d., Zestril 10 mg per day, and Celexa 20 mg per day.  He does feel as though he has prominent stiffness and he is somewhat blue.  We discussed going up on the methadone and Celexa.  EXAMINATION:  VITAL SIGNS:  Blood pressure 150/81, heart rate 91, respiratory rate 16, O2 saturations 97%.  Pain level 7/10.  Temperature is 97.0.  He has very restricted range of motion of his neck and chest wall.  He also has prominent kyphosis through the dorsal spine.  Deep tendon reflexes were symmetric in the upper and lower extremity with no hyperreflexia.  IMPRESSION: 1. Ankylosing spondylitis as confirmed by Dr. Landry Dyke second opinion. 2. Hypertension per Dr. Mamie Laurel.  DISPOSITION: 1. Increase methadone to 10 mg one p.o. q.6h. #120 with no refill. 2. Increase Celexa to 40 mg per day #30 with three refills. 3. Continue on Arthrotec. 4. Follow-up with me in four weeks. 5. Will check CBC, electrolytes, and liver profile at that time if it has    not been checked recently. DD:  01/06/00 TD:  01/06/00 Job: [id number]

## 2010-11-19 NOTE — Discharge Summary (Signed)
NAME:  [name], [name]                    ACCOUNT NO.:  [id number]   MEDICAL RECORD NO.:  [id number]                   PATIENT TYPE:  INP   LOCATION:  0476                                 FACILITY:  [facility]   PHYSICIAN:  Ollen Gross, M.D.                 DATE OF BIRTH:  [birth year]   DATE OF ADMISSION:  01/06/2003  DATE OF DISCHARGE:  01/10/2003                                 DISCHARGE SUMMARY   ADMITTING DIAGNOSES:  1. Bilateral knee osteoarthritis, right more symptomatic than left.  2. Hypertension.  3. Chronic low back pain.   DISCHARGE DIAGNOSES:  1. Osteoarthritis right knee, status post right total knee arthroplasty.  2. Osteoarthritis left knee.  3. Mild postoperative hyponatremia.  4. Hypertension.  5. Chronic low back pain.   PROCEDURE:  Date of surgery:  January 06, 2003.  Procedure:  Right total knee  arthroplasty.  Surgeon:  Dr. Lequita Halt.  Assistant:  Avel Peace, P.A.C.  Spinal anesthesia.  Minimal blood loss.  Tourniquet time:  55 minutes at 300  mmHg.   CONSULTS:  None.   BRIEF HISTORY:  The patient is a 65 year old male seen by Dr. Lequita Halt with a  several-year history of progressive knee pain, right more symptomatic than  the left.  Hurts all the time.  He also has chronic low back pain, followed  by Dr. Thyra Breed of the pain clinic.  X-rays in the office showed bone-  on-bone changes in the right knee with near bone-on-bone in the left knee.  Bilateral knee pain has been ongoing.  It was felt he would benefit from  surgery.  The risks and benefits were discussed.  The patient was  subsequently admitted to the hospital.   LABORATORY DATA:  CBC on admission:  Hemoglobin 13.8, hematocrit of 39.6,  white cell count 4.3, red cell count 4.25, differential all within normal  limits.  Postoperative H&H 13.1 and 37.9.  Last noted H&H 11.7 and 33.5.  PT  and PTT on admission 13 and 33 respectively, 0.9 INR.  Serial protimes  followed.  Last noted PT/INR 17.3  and 1.7.  Chemistry panel on admission all  within normal limits.  Serial BMETs were followed.  Sodium dropped from 138  down to 134.  Glucose went up from 100 to 165, back to 128.  Urinalysis  preoperatively negative.  Follow-up UA negative.  Urine culture negative.  Blood group type A positive.   EKG dated January 03, 2003:  Normal sinus rhythm.  Normal EKG.  No old tracing  to compare.  Confirmed by Dr. Jerral Bonito.   Chest x-ray dated January 03, 2003:  Suboptimal inspiration.  No active disease.  Portable chest January 08, 2003:  Subsegmental right perihilar atelectasis.  No  focal infiltrate.   HOSPITAL COURSE:  The patient was admitted to [facility], taken to  the OR, underwent the above-stated procedure without complication.  The  patient tolerated  the procedure well and was allowed to return to the  recovery room and to the orthopedic floor for continued postoperative care.  Vital signs were followed.  The patient was transferred to the recovery room  and then to the orthopedic floor, given 24 hours of postoperative IV  antibiotics, placed on PC analgesia for pain control following surgery.  Placed on Coumadin for DVT prophylaxis.  Home medications restarted  including methadone, which he was on for chronic pain.  No drain was used.  He was also started on Librium protocol.  PT and OT were consulted to assist  with gait-training ambulation and ADLs.  Slow to progress with physical  therapy initially, only ambulating approximately 12 feet by postoperative  day #2.  Later increased up to 80 feet by day #3 and 150 feet by day #4.  Dressing change initiated on postoperative day #2.  His incision was healing  well.  He did have some elevated temperature through the night between  postoperative day #1 and #2.  Chest x-ray was taken, showed atelectasis,  poor inspiration.  Treated with incentive spirometer and antipyretics.  Temperature did come back down to 100.4.  By day #3 the patient  was doing  better.  His pain was under more control.  He was weaned over to p.o.  medications.  He did have a little bit of difficulty voiding, had to  reinsert the Foley.  It was removed on day #2 and had to be reinserted that  night.  He was started on Flomax and later tried on a voiding trial with  Foley out.  Urine culture was sent, with the culture pending.  Those proved  to be negative.  The following day he was doing much better.  Again the UA  was negative.  He started voiding.  He had been increasing his mobility and  was ready to be discharged home.   PLAN:  1. The patient is discharged home on January 10, 2003.  2. Discharge diagnoses:  Please see above.  3. Discharge medications:  Coumadin, Robaxin, and Dilaudid.  4. Diet:  Low-sodium diet.  5. Follow-up:  Two weeks from surgery.  6. Activity:  Full weightbearing.  Home health PT, home health nursing     through [facility].  Total knee protocol.   DISPOSITION:  Home.   CONDITION UPON DISCHARGE:  Improved.     Alexzandrew L. Julien Girt, P.A.              Ollen Gross, M.D.    ALP/MEDQ  D:  02/12/2003  T:  02/12/2003  Job:  [id number]   cc:   Ernestina Penna, M.D.  [address] [place]  [place] [address]  Fax: [phone]

## 2010-11-19 NOTE — H&P (Signed)
[facility]  Patient:    DEMARLO, RIOJAS                 MRN: [id number] Adm. Date:  [id number] Attending:  Thyra Breed CC:         Stefani Dama, M.D.   History and Physical  [name] comes in for followup evaluation of his ankylosing spondylitis.  Since his last evaluation he has noted some improvements with the Duragesic, but feels as though it is not quite helping.  He has run out of his Zestril so his pressure is up today.  He does note that the Bextra is not very helpful.  He continues to take the Celexa.  He is really being very difficult on himself about the fact that his son had an excellent job offer which fell through because of his ankylosing spondylitis.  He blames himself as the cause of this.  He tells me that he is down to just four beers per night which I encouraged him with regard to and advised him that we will likely need to have him go down even further if we need to go up on the Duragesic.  He continues to have difficulties with swallowing at times.  PHYSICAL EXAMINATION  VITAL SIGNS:  Blood pressure 171/87, heart rate 73, respiratory rate 16, O2 saturation 96%, pain level 7/10.  NECK:  He demonstrates marked restriction in range of motion of his neck, thorax, and lower back.  EXTREMITIES:  Deep tendon reflexes were symmetric.  He has no clonus.  NEUROLOGIC:  He continues to have swallowing difficulties.  IMPRESSION: 1. Ankylosing spondylitis. 2. Dysphagia which is secondary to ______.  He has seen Dr. Danielle Dess who did    not want to operate on him.  Unfortunately, when he leans his neck forward    he does have some compromise of his trachea.  I am concerned with regard to    this and we may need to have him reassessed by Dr. Danielle Dess or another    surgeon if this progresses. 3. Hypertension.  DISPOSITION: 1. Zestril 10 mg one p.o. q.d. #30 with three refills. 2. Increase Duragesic to 100 mcg q.2 days #15.  He  promised me that he would    not drink more than two beers per day on this. 3. Continue Bextra. 4. Continue Celexa 40 mg one p.o. q.d. #30 with six refills. 5. Follow-up with me in eight weeks.  I offered to have him seen by Dr.    Lodema Hong today but he wished to defer. DD:  12/29/00 TD:  12/29/00 Job: [id number]

## 2010-11-19 NOTE — Discharge Summary (Signed)
NAME:  [name], [name] NO.:  [id number]   MEDICAL RECORD NO.:  [id number]          PATIENT TYPE:  IPS   LOCATION:  0305                          [facility]ILITY[facility]  [facility]   PHYSICIAN:  Jeanice Lim, M.D. DATE OF BIRTH:  May 06, 1946   DATE OF ADMISSION:  08/06/2004  DATE OF DISCHARGE:  08/09/2004                                 DISCHARGE SUMMARY   IDENTIFYING DATA:  This is a 65 year old married Caucasian male who  presented to the emergency room requesting detox from alcohol.  Pain Center  stopped prescribing his medications.  Denied depression.  Stated he was not  drunk.  He just has pain and that he has not slept for a month now.   MEDICATIONS:  Verified at the pharmacy.  He was put on a MS-Contin taper  beginning in January.  Apparently that medication should have been finished  yesterday.  Normally prescribed medications previously were Duragesic patch  100 mcg every 48 hours, last filled November 1st, Kadian 100 mg q.12h., last  filled December 1st, methadone 10 mg q.8h., last filled December 1st.  Also  prescribed Celexa, Mobic and Zestril.  States conflict began when they  acknowledged that his usual and customary practice was to drink 2-3 beers  while preparing every night.  Prescribing physician, Dr. Judie Grieve, decided he  did not want to continue prescribing pain medications if patient was  continuing alcohol use.  Alcohol level, in the emergency room, was 6.  Urine  drug screen was negative.  Gives no indication of withdrawal from alcohol.   PAST PSYCHIATRIC HISTORY:  None.   ALCOHOL/DRUG HISTORY:  As previously stated, patient admits to drinking  approximately 2-3 beers daily while fixing dinner as per patient.   PRIMARY CARE PHYSICIAN:  Rudi Heap.   MEDICAL HISTORY:  Followed for arthritis and hypertension.   MEDICATIONS:  Listed above.   PHYSICAL EXAMINATION:  Physical and neurologic exam within normal limits.   LABORATORY DATA:  Routine admission  labs within normal limits.  Again, urine  drug screen negative and alcohol level 6, essentially negative.   MENTAL STATUS EXAM:  Alert and oriented.  No evidence of withdrawal symptoms  from alcohol.  Appropriately groomed and dressed.  Complaining of pain.  Speech was normal rate, rhythm and tone.  Mood depressed, anxious.  Affect  congruent.  Thought processes goal directed, irrational.  He wanted to get  his pain medications restarted.  Judgment and insight were mostly intact.  Intelligence within average range.  The patient did not feel the alcohol was  necessarily a problem but was upset about his pain medications.   ADMISSION DIAGNOSES:   AXIS I:  1.  Depressive disorder not otherwise specified.  2.  Rule out substance-induced mood disorder.  3.  Chronic pain.  4.  Possible subsequent depression.   AXIS II:  Deferred.   AXIS III:  1.  Chronic pain from arthritis.  2.  Hypertension.   AXIS IV:  Limited access to pain management related to physician's concern  about patient's drinking as per patient and other sequela of chronic pain.   AXIS V:  45/65.   HOSPITAL COURSE:  The patient was admitted and ordered routine p.r.n.  medications and underwent further monitoring.  Was encouraged to participate  in individual, group and milieu therapy.  The patient's case was reviewed by  Mallie Darting, physician assistant, and case evaluated and reviewed by Dr.  Milford Cage, who was onsite and on-call during time of admission and both  were in agreement with restarting the patient's pain medications due to  legitimate pain and concern regarding allowing pain to be untreated.  Their  plan was to line him up with a different pain clinic on discharge and did  not feel he needed to be detoxed from alcohol.  Therefore, the patient's  alcohol detox protocol was discontinued due to no clear indication and no  clear withdrawal symptoms.  The patient was resumed on medical medications   including methadone, Duragesic, Celexa, Zestril, Mobic and appointment was  requested to be made at [facility].  Colace ordered, stool  softener and patient was monitored for safety.  Felt to be responding to  treatment.  Markedly improved.  Had insight.  Was educated regarding  importance of following doctor's recommendation and safety issues regarding  drinking in addition to taking pain medications and that indeed there was a  safety issue regarding alcohol use and pain medications as well as alcohol  itself will cause mood depression, respiratory depression and lower pain  threshold, worsening patient's pain, therefore, requiring a higher dose of  pain medications and, if he wants to be treated by a good pain clinic, he  will need to remain abstinent from any substances including alcohol and  follow doctor's recommendations.  The patient appeared to understand this.   CONDITION ON DISCHARGE:  Discharged in improved condition.  Mood euthymic.  No dangerous ideation.  No psychotic symptoms.  Reported motivation to be  abstinent from alcohol and to take medications as prescribed and keep follow-  up appointments.  The patient was given medication education again.   DISCHARGE MEDICATIONS:  1.  Celexa 40 mg daily.  2.  Zestril 10 mg daily.  3.  Mobic 15 mg daily.  4.  Methadone 10 mg q.8h. p.r.n.  5.  Duragesic patch 100 mcg, change every three days rather than two days.      Given a prescription for 3 patches.   FOLLOW UP:  The patient advised not to drink.  Follow up with Dr. Christell Constant for  further medications and [facility] and with Dr.  Penelope Galas on February 17th.   DISCHARGE DIAGNOSES:   AXIS I:  1.  Depressive disorder not otherwise specified.  2.  Rule out substance-induced mood disorder.  3.  Chronic pain.  4.  Possible subsequent depression.   AXIS II:  Deferred.   AXIS III:  1.  Chronic pain from arthritis.  2.  Hypertension.  AXIS  IV:  Limited access to pain management related to physician's concern  about patient's drinking as per patient and other sequela of chronic pain.   AXIS V:  Global Assessment of Functioning on discharge 60.      JEM/MEDQ  D:  09/05/2004  T:  09/06/2004  Job:  [id number]

## 2010-11-19 NOTE — H&P (Signed)
[facility]  Patient:    [name], [name]                 MRN: [id number] Adm. Date:  [id number] Attending:  Thyra Breed CC:         Monica Becton, M.D. at [facility]  Stefani Dama, M.D.   History and Physical  FOLLOW-UP EVALUATION:  [name] comes in for follow-up evaluation of his ankylosing spondylitis. Since his last evaluation, he got an x-ray of his neck, and he does have a very large osteophyte which likely is affecting his swallowing to a degree. Since going on the baclofen, he has had less problems with this. He does have to watch what he eats. He is not interested in seeing Stefani Dama, M.D. quite yet about this.  He is feeling a bit better on the combination of methadone, Arthrotec, Celexa, and baclofen and now is finding that he has difficulties with erectile dysfunction. I advised him that this very well could be coming from all the medications.  PHYSICAL EXAMINATION:  VITAL SIGNS:  Blood pressure is 138/66, heart rate is 71, respiratory rate is 18, O2 saturation is 98%, pain level is 7/10.  NEUROLOGICAL:  Neck range of motion is unchanged from previously. He has much less obstruction when he leans his head flexed forward. Deep tendon reflexes were symmetric in the upper and lower extremities. He has minimal clonus today.  IMPRESSION: 1. Dysphagia on the basis of an anterior osteophyte of his cervical spine. 2. Ankylosing spondylitis. 3. Hypertension per [facility]. 4. Depression currently on Celexa.  DISPOSITION: 1. Continue on methadone 10 mg one p.o. q.6h., #120 with no refill. 2. Continue Arthrotec 75 mg one p.o. b.i.d. 3. Continue with Zestril. 4. Celexa 40 mg one p.o. q.d. to be taken in the morning with 20 mg in the    evening. 5. Continue on baclofen 10 mg 1/2 tablet three times a day. 6. Follow up with me in 4 weeks. I did advise the patient that I do feel  like    he needs to go ahead and see Stefani Dama, M.D. because of his problems    dysphagia, but he wished to hold off for the time being since he is feeling    better. DD:  07/12/00 TD:  07/12/00 Job: [id number]

## 2010-11-19 NOTE — H&P (Signed)
[facility]  Patient:    [name], [name]                 MRN: [id number] Adm. Date:  [id number] Attending:  Thyra Breed CC:         Stefani Dama, M.D.   History and Physical  [name] comes in for followup evaluation of his ankylosing spondylitis.  He was seen by Dr. Danielle Dess since his last visit.  Dr. Danielle Dess felt like things were going well.  My specific concern of his airway with the anterior osteophyte and his swallowing dysphagia does not sound like came up during the course of his evaluation and I am concerned that [name] did not make this well known to Dr. Danielle Dess.  He continues to note difficulties with swallowing whenever he has his neck flexed forward or with his windpipe feeling as though it is being compressed.  He notes that he continues to have stiffness in his shoulders, neck, and lower back in the mornings.  He can not look up above his head because of the limitations in his range of motion.  He is tolerating the methadone well, but notes by morning he does not feel like it is holding as well.  He continues on the Zestril and Celexa.  The Bextra he states is no different from when he was taking the Voltaren.  He continues to consume about six beers per night and I advised him he has to cut back on this.  He continues to feel as though he is coping well but he continues to want to socially isolate himself and I advised him that we really need to consider evaluation by a psychologist and I discussed this with him in detail.  He is quite resistant at the present time.  PHYSICAL EXAMINATION  VITAL SIGNS:  Blood pressure 135/75, heart rate 89, respiratory rate 18, O2 saturation 98%, pain level 7/10.  NECK:  He exhibits marked restriction of range of motion of his neck with inability to extend at all with forward flexion about 30 degrees and very limited rotation.  BACK:  His lumbar spine and SI joints are  unchanged from previously.  EXTREMITIES:  He has no clonus today.  Deep tendon reflexes were hypoactive and symmetric in the upper and lower extremities.  IMPRESSION: 1. Ankylosing spondylitis. 2. Dysphagia reflective of osteophytes compressing the esophagus from the    cervical spine. 3. Hypertension per primary care physician.  DISPOSITION: 1. Stop methadone. 2. Duragesic patch 50 mcg q.3 days #10. 3. Continue with Bextra. 4. Reduce alcohol consumption. 5. I advised him he really needs to see a psychologist and he needs to    seriously consider this. 6. Follow-up with me in four weeks. DD:  10/06/00 TD:  10/06/00 Job: [id number]

## 2010-11-19 NOTE — H&P (Signed)
[facility]  Patient:    [name], [name]                 MRN: [id number] Adm. Date:  [id number] Attending:  Thyra Breed                         History and Physical  Mrs. Mancillas stopped by to share concerns about Mr. Ream and the possibility that she may need to see him with [place] at some point. I advised her that I would try to facilitate this. She is concerned that he may have some problems with increased depression and possibly ethanol use. I advised her that I would try to bring this up with the patient when I saw him in follow-up but that I needed to really see them both together so that we kept this all up front. DD:  08/31/00 TD:  08/31/00 Job: [id number]

## 2010-11-19 NOTE — H&P (Signed)
[facility]  Patient:    [name], [name]                 MRN: [id number] Adm. Date:  [id number] Attending:  Thyra Breed CC:         Birdena Jubilee, M.D.             Stefani Dama, M.D.                         History and Physical  FOLLOWUP EVALUATION  Mr. Buda comes in for followup evaluation of his ankylosing spondylitis. Since his previous evaluation, we had increased the methadone, but unfortunately, he has not gotten that prescription filled and continues on 10 mg t.i.d.  He continues to take the Arthrotec and notes that if he misses a dose of this his pain is markedly increased.  He continues to note benefits from the Celexa and the Zestril.  He notes that he has prominent stiffness if he does not remain moving and he is complaining more of what he described initially as hip discomfort, but sounds more like SI joint discomfort.  He states this dates back from his younger days.  He describes no difficulties with side effects to his medications at the present time.  PHYSICAL EXAMINATION:  VITAL SIGNS:  Blood pressure 151/82, heart rate 71, respiratory rate 16, O2 saturation 97%, pain level 7/10, temperature 97.9.  CHEST:  He demonstrates chest expansion from 112 cm to 114 cm, occiput to wall distance 16 cm, and his Schober test going from 12 to 14 cm.  Deep tendon reflexes were symmetric.  IMPRESSION: 1. Ankylosing spondylitis which is responsive to a mild to modest degree to    his current medical regimen. 2. Hypertension per Dr. Mamie Laurel.  DISPOSITION: 1. Continue on Celexa and Arthrotec at current dose.  We will go ahead and do    laboratory surveillance today for the Arthrotec. 2. Methadone 10 mg one p.o. q.6h., #120, with no refill. 3. Follow up with me in four weeks. DD:  02/04/00 TD:  02/04/00 Job: [id number]
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
## 2010-11-19 NOTE — Op Note (Signed)
NAME:  [name], [name] NO.:  [id number]   MEDICAL RECORD NO.:  [id number]          PATIENT TYPE:  INP   LOCATION:  3010                         FACILITY:  [facility]   PHYSICIAN:  Stefani Dama, M.D.  DATE OF BIRTH:  04-15-1946   DATE OF PROCEDURE:  04/11/2006  DATE OF DISCHARGE:  04/13/2006                                 OPERATIVE REPORT   PREOPERATIVE DIAGNOSES:  1. Diffuse idiopathic skeletal hyperostosis syndrome of a chronic C1      fracture.  2. Neck pain.   POSTOPERATIVE DIAGNOSES:  1. Diffuse idiopathic skeletal hyperostosis syndrome of a chronic C1      fracture.  2. Neck pain.   PROCEDURE:  Occiput to C4 internal fixation with rod/screw technique,  arthrodesis with autograft and allograft from occiput to C4.   INSTRUMENTATION USED:  DePuy.   SURGEON:  Dr. Barnett Abu.   ASSISTANT:  Dr. Tressie Stalker.   ANESTHESIA:  General endotracheal anesthesia.   INDICATIONS FOR PROCEDURE:  [name] is a 65 year old individual  who has had significant chronic neck pain.  He sustained the C1 ring  fracture about a year ago.  He is in a collar.  He has DISH syndrome, which  has caused arthrodesis of all his neck vertebrae up to C3.  However, he has  some very limited motion.  He has severe arthritis in the occiput, C1 and C1-  C2 joints.  His pain has become increasingly intolerable and after careful  consideration, he has been advised regarding surgical arthrodesis from the  occiput to C4.  He is now taken to the operating room for this procedure.   PROCEDURE:  The patient was brought to the operating room supine on the  stretcher.  After careful intubation, he is placed in a three-pin headrest.  He was carefully turned prone onto the operating room table.  The neck was  then secured with a three-pin headrest and the back of the neck was shaved,  prepped with alcohol and DuraPrep, and then draped in the sterile fashion.  A midline incision was  created and carried down to the cervicodorsal fascia.  Fascia was opened on either side of the line to expose the occiput and the  attachments of musculature to the occiput down to the foramen magnum and  were then stripped away laterally.  An occipital plate from DePuy was then  centered over the occipital protuberance and some of the occipital  protuberance  itself was shaved down with a high-speed bur around this to  allow a more flat placement of the plate against the skull.  Then a singular  screw was passed.  This was tap-drilled to a distance of approximately 16  millimeters.  The screw was tapped and was placed to allow initial fixation  of the occipital plate.  The plate was in the shape of a Y and a buttress  plate on the long arm of the Y was then placed into the skull and secured  with two 8-mm screws.  The central screws on the long arm of the Y were then  placed and these measured  approximately 10 to 12 millimeters.  The final  screw was then tightened down at 16 millimeters and the plate was noted to  be rigidly fixed to the occiput.  Trans-Langer's screws were then placed at  C2 and these were placed by free hand drilling into the Langer's arch from  right to left and from left to right.  Then 16-mm screws were placed, one  large at C2 for this purpose.  Lateral mast screws were then placed in C3  and C4.  C4 screws were placed first and these were measured at 16  millimeters; 18-mm screws were used in C3 to allow alignment from the  occipital through the C2 screw down to the C4 screw.  Once all the screws  were placed in alignment, final localizing radiographs were obtained with an  anterolateral projection, but then a soft template rod was contoured to fit  between the screws, first on the right side.  A simple L-bend was created in  the rod to allow placement of two little screw heads.  The contralateral rod  was then fashioned in the same way and placed in a simple  L-bend.  The  screws were then capped and tightened and torqued into the appropriate  position.  The fixation was performed in the usual position in the patient's  neck, then a bone graft was placed in the posterior interspinous spaces all  the way up to the occiput.  The bone graft was obtained from drilling down  and decorticating the laminar and facettal surfaces from occiput to C4 to  allow grafting in this area.  Helio sponge was also prepared with an  aspirate of bone marrow that was obtained from the laminar drilling of C2  and also from each of the facet screws that were placed at C3 and C4.  The  strips of helios were then laid down from the occiput down to C4 along with  some tricalcium phosphate, which was a DePuy product.  With the graft being  placed and the fixation being placed from occiput to C4, the wound was then  closed with #1 Vicryl in the cervicodorsal fashion, 2-0 Vicryl in the  subcutaneous tissues, 3-0 Vicryl subcuticularly, and surgical staples in the  skin.  The patient was then removed from the three-pin headrest, turned  supine and returned to the recovery room in stable condition.  Blood loss  for the procedure was estimated at 400 mL.  Hardware used is Forensic psychologist.      Stefani Dama, M.D.  Electronically Signed     HJE/MEDQ  D:  04/13/2006  T:  04/14/2006  Job:  [id number]

## 2010-11-19 NOTE — Discharge Summary (Signed)
NAME:  [name], [name] NO.:  [id number]   MEDICAL RECORD NO.:  [id number]          PATIENT TYPE:  INP   LOCATION:  1601                         FACILITY:  [facility]   PHYSICIAN:  Ollen Gross, M.D.    DATE OF BIRTH:  14-Sep-1945   DATE OF ADMISSION:  11/05/2007  DATE OF DISCHARGE:  11/08/2007                               DISCHARGE SUMMARY   ADMITTING DIAGNOSES:  1. Osteoarthritis left knee.  2. Documented difficult intubation difficult airway.  3. Ankylosis spondylitis.  4. Degenerative disk disease.  5. Sleep apnea.  6. Hypertension.   DISCHARGE DIAGNOSES:  1. Osteoarthritis left knee status post left total knee replacement      arthroplasty.  2. Mild postop hyponatremia.  3. Hyperkalemia, improved.  4. Documented difficult intubation difficult airway.  5. Ankylosis spondylitis.  6. Degenerative disk disease.  7. Sleep apnea.  8. Hypertension.   PROCEDURE:  Nov 05, 2007, left total knee.  Surgeon Dr. Lequita Halt, assisted  Joan Mayans PA-C.  Surgery was performed under spinal anesthesia with  Duramorph added.   CONSULTATIONS:  None.   BRIEF HISTORY:  [name] is a 65 year old male with end-stage arthritis  left knee, progressive worsening pain dysfunction, failed operative  management, now presents for total knee arthroplasty.   LABORATORY DATA:  Preop CBC showed hemoglobin 13.2, hematocrit 38.0,  white cell count 5.0, platelets 205, postop hemoglobin down to 11.4 then  10.7.  Last H&H 10.2 and 29.1.  PT/PTT preop 13.6 and 33, respectively.  INR 1.0.  Serial protimes follows:  PT/INR 20.6 and 1.7.  Chem panel on  admission, mildly a low sodium of 133, elevated potassium of 5.7,  minimally elevated ALT of 61.  Remaining Chem panel within normal  limits.  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  Sodium did drop down to 132,  potassium came down to a lower level of 5.2, glucose went up from 110 to  140, back down to 128.  Preop UA trace hemoglobin 0-2 white cells, 0-2  red  cells, rare epithelial, rare bacteria.  Blood group type A+.   Chest x-ray October 29, 2007, no active cardiopulmonary disease.   EKG October 09, 2007, sinus bradycardia, confirmed okay.   HOSPITAL COURSE:  The patient was admitted to [facility],  tolerated procedure well, later transferred to the recovery room on the  orthopedic floor, started on PCA and p.o. analgesic pain control  following surgery and given 24 hours postop IV antibiotics.  Started on  see Pap at night.  Had decent urinary output started back on blood  pressure medications.  Hemoglobin was stable.  Started getting up out of  bed.  By day #2, the patient was pretty sore from doing therapy, but a  little bit better pain control.  Hemoglobin was 10.7.  Dressing change  incision looked good.  Sodium was stable.  Preop it was 133 and was  wanted to be 132 essentially unchanged, only down one-point.  Pressure  was stable, progressing with PT, walking about 50 feet and then later  175 feet did very well with PT on the afternoon of day two and by  day  three, tolerating medications was discharged home.   DISCHARGE/PLAN:  1. Patient discharged home on Nov 08, 2007.  2. Discharge diagnoses, please see above.  3. Discharge medications:  Percocet, Robaxin, Coumadin.   DISCHARGE INSTRUCTIONS:  1. Diet:  Heart healthy diet.  2. Activity:  Weightbearing as tolerated left leg.  Home health PT and      home health nursing.  Total knee protocol.  Follow-up 2 weeks.   DISPOSITION:  Home.   CONDITION ON DISCHARGE:  Improving.      [name], P.A.C.      Ollen Gross, M.D.  Electronically Signed    ALP/MEDQ  D:  12/19/2007  T:  12/19/2007  Job:  [id number]   cc:   Ollen Gross, M.D.  Fax: [phone]   [facility] Vinnie [facility] Jennet Maduro

## 2010-11-19 NOTE — H&P (Signed)
[facility]  Patient:    [name], [name] Visit Number: [id number] MRN: [id number]          Service Type: PMG Location: [facility] Attending Physician:  Thyra Breed Adm. Date:  02/22/2001   CC:         Stefani Dama, M.D.  Paulita Cradle, [facility]   History and Physical  FOLLOW-UP EVALUATION  [name] comes in for follow-up evaluation of his back, neck, and thoracic pain on the basis of ankylosing spondylitis.  Since his last evaluation, he noted that the Duragesic only partially helped him at best and actually did no better at 100 mcg versus 75 mcg.  He is taking an occasional 5 mg of methadone and notes that the combination helped significantly.  He does tolerate the Bextra well and feels like it might be helpful.  He notes that he gets prominent stiffness if he sits for a period of time.  He is complaining of some neck discomfort in addition.  He has developed increasing hoarseness and continues to have a swallowing problem and has been seen by Dr. Danielle Dess with regard to this.  CURRENT MEDICATIONS:  Zestril, Celexa, Bextra, Duragesic.  PHYSICAL EXAMINATION:  VITAL SIGNS:  Blood pressure 155/84, heart rate is 84, respiratory rate is 15, O2 saturations 93%.  Pain level is 6/10.  EXTREMITIES/NEUROLOGIC:  He demonstrates marked restricted range of motion of his neck, thoracic, and lumbar spine.  He has about 15 degrees of forward flexion.  His deep tendon reflexes were symmetric in the upper and lower extremities.  IMPRESSION: 1. Ankylosing spondylitis. 2. Dysphagia secondary to osteophytes in the cervical region for which he    has been evaluated by Dr. Danielle Dess.  He has developed some increased    hoarseness. 3. Other medical problems per primary care physician.  DISPOSITION: 1. Reduce Duragesic to 75 mcg, one every two days, #15, with no refill. 2. Methadone 5 mg one p.o. b.i.d., #60 with no refill. 3. Increase  Bextra to 20 mg per day.  He was given samples. 4. Follow up with me in 4-8 weeks. 5. He is encouraged to let Dr. Danielle Dess or his primary care physician know if    he gets progressive worsening of his dysphagia or hoarseness. Attending Physician:  Thyra Breed DD:  02/23/01 TD:  02/25/01 Job: [id number]

## 2010-11-19 NOTE — Op Note (Signed)
NAME:  [name], [name]                    ACCOUNT NO.:  [id number]   MEDICAL RECORD NO.:  [id number]                   PATIENT TYPE:  INP   LOCATION:  X006                                 FACILITY:  [facility]   PHYSICIAN:  Ollen Gross, M.D.                 DATE OF BIRTH:  02/28/1946   DATE OF PROCEDURE:  01/06/2003  DATE OF DISCHARGE:                                 OPERATIVE REPORT   PREOPERATIVE DIAGNOSIS:  Osteoarthritis of right knee.   POSTOPERATIVE DIAGNOSIS:  Osteoarthritis of right knee.   PROCEDURE:  Right total knee arthroplasty.   SURGEON:  Ollen Gross, M.D.   ASSISTANT:  Alexzandrew L. Julien Girt, P.A.   ANESTHESIA:  Spinal.   ESTIMATED BLOOD LOSS:  Minimal.   TOURNIQUET TIME:  55 minutes at 300 mmHg.   COMPLICATIONS:  None.   CONDITION:  [name] to the recovery room.   BRIEF CLINICAL NOTE:  [name] is a 65 year old male with end-stage arthritis of  both knees, right worse than the left.  He has failed nonoperative  management and presents now for total knee arthroplasty.   PROCEDURE IN DETAIL:  After successful administration of spinal anesthetic,  a tourniquet is placed on the right thigh, the right lower extremity prepped  and draped in the usual sterile fashion.  Extremity is wrapped with an  Esmarch, knee flexed, and tourniquet inflated to 300 mmHg.  Standard midline  incision is made with a 10 blade through the subcutaneous tissue to the  level of the extensor mechanism.  Then a fresh blade is used to make a  medial parapatellar arthrotomy and the soft tissue over the proximal medial  tibia is subperiosteally elevated to the joint line with a knife and then  through the semimembranosus bursa with a curved osteotome.  Soft tissue over  the proximal lateral tibia is elevated with attention being paid to avoid  the patellar tendon on the tibial tubercle.  Patella is everted, the knee  flexed to 90 degrees, the ACL and PCL removed.  Drill was used to create  a  starting hole in the distal femur.  Canal is irrigated, and 5-degree right  valgus alignment guide placed.  Referencing off the posterior condyles,  rotation is marked in a block measuring 10 mm off the distal femur.  Distal  femoral resection is made with an oscillating saw.  Sizing block is placed,  and the size 5 is the most appropriate.  Rotation is marked at the  epicondylar axis, and a size 5 AC blocked is pinned, and then the anterior  and posterior cuts are made.   Tibia is subluxed forward, and the menisci are removed.  Extramedullary  tibial alignment guide is placed referencing proximally from the medial  aspect of the tibial aspect and distally along the second metatarsal axis  and tibial crest.  Blocks are pinned measuring 10 mm on the nondeficient  lateral side.  Tibia resection  is made with an oscillating saw.  Size 5 is  the most appropriate tibia, and then the proximal tibia is prepared with the  modular drill and keel punch.  Femoral preparation is completed with the  intercondylar and chamfer cuts.   Size 5 posterior-stabilized femoral trial, size 5 tibial tray trial, and the  10-mm posterior-stabilized rotating platform trial placed.  With the 10,  there was a slight bit of hyperextension.  So I went to a 12.5, and he still  achieved full extension with excellent varus and valgus balance throughout  the full range of motion.  The patella is everted, thickness measured at 27  mm, resection taken to 15 mm, and trial patella placed.  A 41 template  placed and a holes drills.  The trial patella placed and tracks normally.  Osteophytes were then removed at the posterior femur with the trials in  place.  All trials are then removed, and the cut bone surfaces prepared with  pulsatile lavage.  Cement is mixed and once ready for implantation, the size  5 tibial tray, size 5 posterior-stabilized femur, and 41 patella are all  cemented into place, and the patella is held with  a clamp.  The 12.5-mm  trial inserts are placed, and the knee held in full extension.  All extruded  cement is removed.  Once the cement is fully hardened, then the permanent  12.5-mm posterior-stabilized rotating platform inserts are placed with the  tibial tray.  The wound is then copiously irrigated with antibiotic  solution, and the extensor mechanism closed over a Hemovac drain with  interrupted #1 PDS.  Flexion against gravity is 130 degrees.  Tourniquet is  then released with a total time of  55 minutes.  Minor bleeding is stopped  with cautery.  The subcutaneous tissues are closed with interrupted 2-0  Vicryl and subcuticular  running 4-0 Monocryl.  The incision is clean and  dry, and Steri-Strips and a bulky sterile dressing applied.  He is then  awakened and transported to recovery in [name] condition.                                               Ollen Gross, M.D.    FA/MEDQ  D:  01/06/2003  T:  01/06/2003  Job:  [id number]

## 2010-11-19 NOTE — Consult Note (Signed)
[facility]  Patient:    [name], [name]                 MRN: [id number] Proc. Date: 11/10/99 Adm. Date:  [id number] Attending:  Thyra Breed CC:         Stefani Dama, M.D.             Lemmie Evens, M.D.             Paulita Cradle, M.D.                          Consultation Report  HISTORY:  [name] comes in for a follow-up evaluation of his ankylosing spondylitis.  Since his previous evaluation the patient has developed a lot of discomfort in his head, with occipital headaches, especially after weed-eating, and he is noticing increasing problems with swallowing.  He also notes ongoing problems with his knees, and has seen Dr. Ollen Gross with regard to this.  CURRENT MEDICATIONS:  He continues on his methadone at 5 mg two p.o. q.8h., Celexa, Arthrotec, and Zestril.  PHYSICAL EXAMINATION:  VITAL SIGNS:  Blood pressure 150/78, heart rate 87, respirations 14, O2 saturation 97%.  Pain level is 7/10.  Temperature is 97.0 degrees.  HEENT:  Occiput to wall shows a limitation of 18.0 cm, which is worsening from previously.  CHEST:  His chest wall expansion was from 111 to 114 cm at the nipple height.  BACK:  Very restrictive range of motion.  NEUROLOGIC:  Unchanged.  The patient apparently underwent an evaluation for Social Security Disability, nd had x-rays of his neck, left knee, and lumbar spine performed.  I have gotten copies of these, and he does have bridging with anterior osteophytes at C3 through C4, and I suspect that these are effecting his swallowing, and medial narrowing of the left knee, and lumbar spondylosis.  His SI joints were described as being "normal" on the plain films.  IMPRESSION: 1. Diffuse spondylosis with what I suspect is some ankylosing spondylitis. 2. Hypertension per his primary care physician.  DISPOSITION: 1. Continue on methadone 5 mg two p.o. q.8h. #180, with no refills. 2. Continue  Celexa. 3. Continue on Arthrotec. 4. Taper of prednisone, starting at 50 mg, and reducing q.2d. by 10 mg    until he is off of this #30, with no refills. 5. A prescription for scopolamine patches for a fishing trip #4, with    no refills. 6. Follow up with me in four weeks. DD:  11/10/99 TD:  11/12/99 Job: [id number]

## 2010-11-19 NOTE — H&P (Signed)
[facility]  Patient:    [name], [name]                 MRN: [id number] Adm. Date:  [id number] Attending:  Thyra Breed CC:         Stefani Dama, M.D.  Paulita Cradle, P.A., Ignacia Bayley   History and Physical  HISTORY OF PRESENT ILLNESS:  [name] comes in for followup evaluation of his ankylosing spondylitis and follow up to his MRI of his neck.  His MRI does demonstrate fairly prominent osteophytosis anteriorly, which is likely the cause of his swallowing difficulties and intermittent breathing problems. When he flexes his neck forward, he feels as though his "windpipe" is being cut off.  He has not noted any improvement on going up on the methadone, so we will probably cut back on this.  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  I asked him about alcohol consumption and he admits to six beers per night and I advised him that this is probably not a good mix with all of his other medications and probably is resulting in his impotence. He asked me how, after years of this consumption, it would be a problem and I advised him that by adding in these other medications he is likely developing a problem to the potential side effects of the alcohol and that he should cut it back to just two beers at a maximum per night.  He seems to feel as though he is coping well otherwise.  He also is taking some baclofen.  PHYSICAL EXAMINATION:  VITAL SIGNS:  The patient is afebrile with vital signs stable.  MUSCULOSKELETAL:  He demonstrates no improvement in range of motion of his neck and lower back.  NEUROLOGIC:  Grossly unchanged.  He has no clonus.  IMPRESSION: 1. Ankylosing spondylitis. 2. Dysphagia and airway issues, likely reflective of the osteophytosis    anteriorly over the cervical spine. 3.  Hypertension per his primary care physician.  DISPOSITION: 1. Reduce methadone to 10 mg 1 p.o. q.6h. #120 with no refill. 2. Remain off of the baclofen. 3. Stop Arthrotec and try Bextra 10 mg 1 p.o. q.d. #30 with two refills.  He    was advised of the potential side effects of this. 4. Reduce alcohol consumption to just two beers per day. 5. Continue other medications.  He was given prescriptions for his Zestril, as    he will run out of this, in addition to the Bextra and the methadone today.    I advised him that we would need to go ahead and get him established with    Dr. Danielle Dess for reevaluation with regard to his swallowing dysfunction.    In addition, I asked him to consider seeing Dr. Jerrye Beavers with    regard to some psychologic counseling for pain coping skills, especially    since he has been hurting for such a long period of time.  He seems quite    resistant to this today, but I advised him we would need to address this in    the future and that he should keep his mind open with regard to this. DD:  09/06/00 TD:  09/07/00 Job: [id number]

## 2010-11-22 ENCOUNTER — Encounter: Payer: Self-pay | Admitting: Pulmonary Disease

## 2010-11-24 ENCOUNTER — Ambulatory Visit: Payer: Self-pay | Admitting: Pulmonary Disease

## 2010-11-30 ENCOUNTER — Ambulatory Visit (INDEPENDENT_AMBULATORY_CARE_PROVIDER_SITE_OTHER): Payer: Medicare Other | Admitting: Pulmonary Disease

## 2010-11-30 ENCOUNTER — Encounter: Payer: Self-pay | Admitting: Pulmonary Disease

## 2010-11-30 VITALS — BP 148/64 | HR 70 | Temp 98.9°F | Ht 71.0 in | Wt 256.6 lb

## 2010-11-30 DIAGNOSIS — G4733 Obstructive sleep apnea (adult) (pediatric): Secondary | ICD-10-CM

## 2010-11-30 NOTE — Patient Instructions (Signed)
Will send an order to [facility] for new mask and supplies Work on weight loss followup with me in one year.

## 2010-11-30 NOTE — Assessment & Plan Note (Signed)
The pt is doing very well with cpap, but is overdue for a new mask and supplies.  I have asked him to continue with the cpap device, and to work on weight reduction.  He is to call if any issues arise.

## 2010-11-30 NOTE — Progress Notes (Signed)
  Subjective:    Patient ID: [name], male    DOB: 02/06/1946, 65 y.o.   MRN: [id number]  HPI The pt comes in today for f/u of his known osa.  He is wearing cpap compliantly, but is overdue for mask change and supplies.  He feels he is sleeping well with the device, and denies any issues with mask fit or pressure.  His weight is stable since his last visit.   Review of Systems  Constitutional: Negative for fever and unexpected weight change.  HENT: Positive for congestion. Negative for ear pain, nosebleeds, sore throat, rhinorrhea, sneezing, trouble swallowing, dental problem, postnasal drip and sinus pressure.   Eyes: Negative for redness and itching.  Respiratory: Negative for cough, chest tightness, shortness of breath and wheezing.   Cardiovascular: Negative for palpitations and leg swelling.  Gastrointestinal: Negative for nausea and vomiting.  Genitourinary: Negative for dysuria.  Musculoskeletal: Positive for joint swelling.  Skin: Negative for rash.  Neurological: Negative for headaches.  Hematological: Does not bruise/bleed easily.  Psychiatric/Behavioral: Negative for dysphoric mood. The patient is not nervous/anxious.        Objective:   Physical Exam Ow male in nad No skin breakdown or pressure necrosis from cpap mask No purulence or discharge from nares LE with 1+ edema, no cyanosis  Alert and oriented, does not appear sleepy, moves all 4        Assessment & Plan:

## 2010-12-17 ENCOUNTER — Telehealth: Payer: Self-pay | Admitting: Pulmonary Disease

## 2010-12-17 NOTE — Telephone Encounter (Signed)
Pt last seen by [facility] 5.29.12 - per epic, order faxed on same day to [employer] for new cpap supplies.    Called spoke with Joni Reining @ [employer] who reports that an order was not received per pt's chart.  However, Joni Reining tells me that an order is not needed every time and that they try to contact patient's every 6 months.  Joni Reining then told me that she will call the patient directly to see exactly what he needs.  Verified pt's home number with Joni Reining.  LMOM TCB x1 to inform pt of my findings and/or verify that [facility] has contacted him.

## 2010-12-21 NOTE — Telephone Encounter (Signed)
[email or web address]  

## 2010-12-22 NOTE — Telephone Encounter (Signed)
[email or web address]. [name], CMA  

## 2010-12-22 NOTE — Telephone Encounter (Signed)
We have called and LM for pt x 3.  Per protocol, will sign of on this message and wait for pt to return our call.

## 2011-03-29 LAB — CBC
HCT: 38 — ABNORMAL LOW
Hemoglobin: 13.2
RBC: 4.08 — ABNORMAL LOW
WBC: 5

## 2011-03-29 LAB — URINALYSIS, ROUTINE W REFLEX MICROSCOPIC
Bilirubin Urine: NEGATIVE
Glucose, UA: NEGATIVE
Protein, ur: NEGATIVE
Urobilinogen, UA: 0.2

## 2011-03-29 LAB — PROTIME-INR
INR: 1
Prothrombin Time: 13.6

## 2011-03-29 LAB — URINE MICROSCOPIC-ADD ON

## 2011-03-29 LAB — COMPREHENSIVE METABOLIC PANEL
Alkaline Phosphatase: 84
BUN: 19
CO2: 30
Chloride: 96
Glucose, Bld: 110 — ABNORMAL HIGH
Potassium: 5.7 — ABNORMAL HIGH
Total Bilirubin: 0.6

## 2011-04-08 LAB — PROTIME-INR
INR: 1 (ref 0.00–1.49)
Prothrombin Time: 13 seconds (ref 11.6–15.2)

## 2011-04-08 LAB — POCT I-STAT, CHEM 8
Glucose, Bld: 104 mg/dL — ABNORMAL HIGH (ref 70–99)
HCT: 42 % (ref 39.0–52.0)
Hemoglobin: 14.3 g/dL (ref 13.0–17.0)
Potassium: 5.7 mEq/L — ABNORMAL HIGH (ref 3.5–5.1)
TCO2: 27 mmol/L (ref 0–100)

## 2011-12-02 ENCOUNTER — Ambulatory Visit (INDEPENDENT_AMBULATORY_CARE_PROVIDER_SITE_OTHER)
Admission: RE | Admit: 2011-12-02 | Discharge: 2011-12-02 | Disposition: A | Discharge status: Home / Self Care | Payer: Medicare Other | Source: Ambulatory Visit | Attending: Pulmonary Disease | Admitting: Pulmonary Disease

## 2011-12-02 ENCOUNTER — Encounter: Payer: Self-pay | Admitting: Pulmonary Disease

## 2011-12-02 ENCOUNTER — Ambulatory Visit (INDEPENDENT_AMBULATORY_CARE_PROVIDER_SITE_OTHER): Payer: Medicare Other | Admitting: Pulmonary Disease

## 2011-12-02 VITALS — BP 168/78 | HR 74 | Temp 98.3°F | Ht 71.0 in | Wt 258.6 lb

## 2011-12-02 DIAGNOSIS — J61 Pneumoconiosis due to asbestos and other mineral fibers: Secondary | ICD-10-CM | POA: Insufficient documentation

## 2011-12-02 DIAGNOSIS — G4733 Obstructive sleep apnea (adult) (pediatric): Secondary | ICD-10-CM

## 2011-12-02 NOTE — Progress Notes (Signed)
  Subjective:    Patient ID: [name], male    DOB: 1946/02/10, 66 y.o.   MRN: [id number]  HPI The patient comes in today for followup of his obstructive sleep apnea.  He is wearing CPAP compliantly, and feels he is doing well with the device.  He is sleeping well, and has adequate daytime alertness.  His only complaint is that his mask is over due for exchange, and he has not been able to contact his medical equipment company.  Of note, they have gone out of business.  Finally, the patient has a history of asbestos exposure while working for [employer].  He has been seen by a pulmonologist who is also a 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  He would like to establish here for his yearly followups which are to include a chest x-ray and PFTs.   Review of Systems  Constitutional: Negative.  Negative for fever and unexpected weight change.  HENT: Positive for congestion. Negative for ear pain, nosebleeds, sore throat, rhinorrhea, sneezing, trouble swallowing, dental problem, postnasal drip and sinus pressure.   Eyes: Negative.  Negative for redness and itching.  Respiratory: Negative.  Negative for cough, chest tightness, shortness of breath and wheezing.   Cardiovascular: Negative.  Negative for palpitations and leg swelling.  Gastrointestinal: Negative.  Negative for nausea and vomiting.  Genitourinary: Negative.  Negative for dysuria.  Musculoskeletal: Positive for arthralgias. Negative for joint swelling.  Skin: Negative.  Negative for rash.  Neurological: Negative.  Negative for headaches.  Hematological: Negative.  Does not bruise/bleed easily.  Psychiatric/Behavioral: Negative.  Negative for dysphoric mood. The patient is not nervous/anxious.        Objective:   Physical Exam Obese male in no acute distress Nose without purulence or discharge noted No skin breakdown or pressure necrosis from the CPAP mask Lower extremities with mild edema, no cyanosis Alert and  oriented, moves all 4 extremities.       Assessment & Plan:

## 2011-12-02 NOTE — Assessment & Plan Note (Signed)
The patient is doing very well with CPAP, but is in need of new supplies and a mask.  We'll get him referred to a new DME, and I have encouraged him to work aggressively on weight loss.

## 2011-12-02 NOTE — Patient Instructions (Signed)
Will get you with a new cpap supply company Will check cxr today, and call you with results.   Will also schedule you for breathing tests in next 2-3 weeks, and will call you with the results. Will see you back in one year, and check cxr and breathing tests at the same time. Work on weight loss.

## 2011-12-02 NOTE — Progress Notes (Signed)
Addended by: Michel Bickers A on: 12/02/2011 02:20 PM   Modules accepted: Orders

## 2011-12-02 NOTE — Assessment & Plan Note (Signed)
The patient has a history of working for [employer], where he was exposed to asbestos.  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  The power is requiring that he have a yearly x-ray and PFTs, and I'm happy to follow him for this.

## 2012-01-27 ENCOUNTER — Encounter (HOSPITAL_COMMUNITY): Payer: Self-pay | Admitting: *Deleted

## 2012-01-27 ENCOUNTER — Emergency Department (HOSPITAL_COMMUNITY)
Admission: EM | Admit: 2012-01-27 | Discharge: 2012-01-27 | Disposition: A | Discharge status: Home / Self Care | Payer: Medicare Other | Attending: Emergency Medicine | Admitting: Emergency Medicine

## 2012-01-27 DIAGNOSIS — I10 Essential (primary) hypertension: Secondary | ICD-10-CM | POA: Insufficient documentation

## 2012-01-27 DIAGNOSIS — T24201A Burn of second degree of unspecified site of right lower limb, except ankle and foot, initial encounter: Secondary | ICD-10-CM

## 2012-01-27 DIAGNOSIS — T24219A Burn of second degree of unspecified thigh, initial encounter: Secondary | ICD-10-CM | POA: Insufficient documentation

## 2012-01-27 DIAGNOSIS — X118XXA Contact with other hot tap-water, initial encounter: Secondary | ICD-10-CM | POA: Insufficient documentation

## 2012-01-27 DIAGNOSIS — M129 Arthropathy, unspecified: Secondary | ICD-10-CM | POA: Insufficient documentation

## 2012-01-27 DIAGNOSIS — T310 Burns involving less than 10% of body surface: Secondary | ICD-10-CM | POA: Insufficient documentation

## 2012-01-27 HISTORY — DX: Essential (primary) hypertension: I10

## 2012-01-27 MED ORDER — HYDROMORPHONE HCL PF 1 MG/ML IJ SOLN
1.0000 mg | Freq: Once | INTRAMUSCULAR | Status: DC
  Filled 2012-01-27: qty 1

## 2012-01-27 MED ORDER — SILVER SULFADIAZINE 1 % EX CREA
TOPICAL_CREAM | Freq: Once | CUTANEOUS | Status: AC
  Administered 2012-01-27: 13:00:00 via TOPICAL
  Filled 2012-01-27: qty 50

## 2012-01-27 MED ORDER — OXYCODONE-ACETAMINOPHEN 5-325 MG PO TABS: 1.0000 | ORAL_TABLET | ORAL | Status: AC | PRN

## 2012-01-27 MED ORDER — SILVER SULFADIAZINE 1 % EX CREA: TOPICAL_CREAM | Freq: Every day | CUTANEOUS | Status: DC
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
## 2012-02-28 ENCOUNTER — Emergency Department (HOSPITAL_COMMUNITY)
Admission: EM | Admit: 2012-02-28 | Discharge: 2012-02-28 | Disposition: A | Discharge status: Home / Self Care | Payer: Medicare Other | Attending: Emergency Medicine | Admitting: Emergency Medicine

## 2012-02-28 ENCOUNTER — Emergency Department (HOSPITAL_COMMUNITY): Payer: Medicare Other

## 2012-02-28 ENCOUNTER — Encounter (HOSPITAL_COMMUNITY): Payer: Self-pay

## 2012-02-28 DIAGNOSIS — N289 Disorder of kidney and ureter, unspecified: Secondary | ICD-10-CM | POA: Insufficient documentation

## 2012-02-28 DIAGNOSIS — M436 Torticollis: Secondary | ICD-10-CM | POA: Insufficient documentation

## 2012-02-28 DIAGNOSIS — I10 Essential (primary) hypertension: Secondary | ICD-10-CM | POA: Insufficient documentation

## 2012-02-28 DIAGNOSIS — R05 Cough: Secondary | ICD-10-CM

## 2012-02-28 DIAGNOSIS — R0602 Shortness of breath: Secondary | ICD-10-CM | POA: Insufficient documentation

## 2012-02-28 DIAGNOSIS — IMO0002 Reserved for concepts with insufficient information to code with codable children: Secondary | ICD-10-CM | POA: Insufficient documentation

## 2012-02-28 DIAGNOSIS — M549 Dorsalgia, unspecified: Secondary | ICD-10-CM | POA: Insufficient documentation

## 2012-02-28 DIAGNOSIS — F29 Unspecified psychosis not due to a substance or known physiological condition: Secondary | ICD-10-CM | POA: Insufficient documentation

## 2012-02-28 DIAGNOSIS — W19XXXA Unspecified fall, initial encounter: Secondary | ICD-10-CM | POA: Insufficient documentation

## 2012-02-28 DIAGNOSIS — R059 Cough, unspecified: Secondary | ICD-10-CM

## 2012-02-28 DIAGNOSIS — R4182 Altered mental status, unspecified: Secondary | ICD-10-CM | POA: Insufficient documentation

## 2012-02-28 LAB — CBC WITH DIFFERENTIAL/PLATELET
Basophils Absolute: 0.1 10*3/uL (ref 0.0–0.1)
Eosinophils Absolute: 0.3 10*3/uL (ref 0.0–0.7)
Eosinophils Relative: 2 % (ref 0–5)
Lymphocytes Relative: 15 % (ref 12–46)
Lymphs Abs: 1.8 10*3/uL (ref 0.7–4.0)
MCH: 30.3 pg (ref 26.0–34.0)
Neutrophils Relative %: 71 % (ref 43–77)
Platelets: 233 10*3/uL (ref 150–400)
RBC: 3.57 MIL/uL — ABNORMAL LOW (ref 4.22–5.81)
RDW: 13.3 % (ref 11.5–15.5)
WBC: 11.9 10*3/uL — ABNORMAL HIGH (ref 4.0–10.5)

## 2012-02-28 LAB — BASIC METABOLIC PANEL
BUN: 46 mg/dL — ABNORMAL HIGH (ref 6–23)
Calcium: 9.8 mg/dL (ref 8.4–10.5)
GFR calc non Af Amer: 39 mL/min — ABNORMAL LOW (ref >90)
Glucose, Bld: 110 mg/dL — ABNORMAL HIGH (ref 70–99)
Potassium: 4.4 mEq/L (ref 3.5–5.1)
Sodium: 133 mEq/L — ABNORMAL LOW (ref 135–145)

## 2012-02-28 LAB — POCT I-STAT TROPONIN I: Troponin i, poc: 0.01 ng/mL (ref 0.00–0.08)

## 2012-02-28 LAB — URINALYSIS, ROUTINE W REFLEX MICROSCOPIC
Leukocytes, UA: NEGATIVE
Nitrite: NEGATIVE
Protein, ur: 100 mg/dL — AB
Specific Gravity, Urine: 1.016 (ref 1.005–1.030)
Urobilinogen, UA: 0.2 mg/dL (ref 0.0–1.0)
pH: 5.5 (ref 5.0–8.0)

## 2012-02-28 MED ORDER — ALBUTEROL SULFATE HFA 108 (90 BASE) MCG/ACT IN AERS
2.0000 | INHALATION_SPRAY | RESPIRATORY_TRACT | Status: DC | PRN
  Administered 2012-02-28: 2 via RESPIRATORY_TRACT
  Filled 2012-02-28: qty 6.7

## 2012-02-28 MED ORDER — AZITHROMYCIN 250 MG PO TABS: ORAL_TABLET | ORAL | Status: AC

## 2012-02-28 MED ORDER — SODIUM CHLORIDE 0.9 % IV BOLUS (SEPSIS): 1000.0000 mL | Freq: Once | INTRAVENOUS | Status: DC

## 2012-02-28 MED ORDER — AEROCHAMBER Z-STAT PLUS/MEDIUM MISC
1.0000 | Freq: Once | Status: AC
  Administered 2012-02-28: 1

## 2012-02-28 NOTE — ED Notes (Signed)
Pt given small cup of water at request to help provide urine sample.

## 2012-02-28 NOTE — ED Provider Notes (Signed)
Medical screening examination/treatment/procedure(s) were conducted as a shared visit with non-physician practitioner(s) and myself.  I personally evaluated the patient during the encounter  Patient offered IV fluids for his dehydration and has deferred at this time. Patient with elevation of his creatinine and he was instructed that he needs fluids and he understands the risk of worsening renal insufficiency and a renal failure. Patient will followup with his Dr. in [name], MD 02/28/12 [phone]
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
## 2012-02-28 NOTE — ED Notes (Signed)
Pt fell 2 days ago, tripped, no major injury, no LOC. Now c/o pain all over but mainly in lower back. Abrasion noted on right knee

## 2012-02-28 NOTE — ED Notes (Signed)
Pt also c/o URI sx for a few days. Pt then sts when he was walking he couldn't remember why he was walking where he was, felt confused. Pt sts this has never happened before, concerning to him.

## 2012-02-28 NOTE — ED Notes (Signed)
Patient transported to X-ray 

## 2012-03-02 NOTE — ED Provider Notes (Signed)
Medical screening examination/treatment/procedure(s) were performed by non-physician practitioner and as supervising physician I was immediately available for consultation/collaboration.  Paton Crum T Naithan Delage, MD 03/02/12 [id number] 

## 2012-12-25 ENCOUNTER — Ambulatory Visit (INDEPENDENT_AMBULATORY_CARE_PROVIDER_SITE_OTHER): Payer: Worker's Compensation | Admitting: Pulmonary Disease

## 2012-12-25 ENCOUNTER — Ambulatory Visit: Payer: Self-pay | Admitting: Pulmonary Disease

## 2012-12-25 DIAGNOSIS — J61 Pneumoconiosis due to asbestos and other mineral fibers: Secondary | ICD-10-CM

## 2012-12-25 LAB — PULMONARY FUNCTION TEST

## 2012-12-25 NOTE — Progress Notes (Deleted)
pft done today. Jennifer Castillo, CMA  

## 2012-12-25 NOTE — Progress Notes (Signed)
PFT done today. 

## 2012-12-27 ENCOUNTER — Telehealth: Payer: Self-pay | Admitting: Pulmonary Disease

## 2012-12-27 NOTE — Telephone Encounter (Signed)
LMOM x 1 

## 2012-12-27 NOTE — Telephone Encounter (Signed)
Please let pt know that his breathing tests are worse compared to 2008.  He is due for ov, and we can discuss further at that time.

## 2013-01-02 ENCOUNTER — Telehealth: Payer: Self-pay | Admitting: *Deleted

## 2013-01-02 NOTE — Telephone Encounter (Signed)
Results have been explained to patient, pt expressed understanding. Nothing further needed.  appt 01/14/13  with KC at 12p to review PFT

## 2013-01-09 ENCOUNTER — Encounter: Payer: Self-pay | Admitting: Pulmonary Disease

## 2013-01-14 ENCOUNTER — Encounter: Payer: Self-pay | Admitting: Pulmonary Disease

## 2013-01-14 ENCOUNTER — Ambulatory Visit (INDEPENDENT_AMBULATORY_CARE_PROVIDER_SITE_OTHER): Payer: Worker's Compensation | Admitting: Pulmonary Disease

## 2013-01-14 VITALS — BP 132/62 | HR 67 | Temp 97.9°F | Ht 72.0 in | Wt 269.6 lb

## 2013-01-14 DIAGNOSIS — G4733 Obstructive sleep apnea (adult) (pediatric): Secondary | ICD-10-CM | POA: Diagnosis not present

## 2013-01-14 DIAGNOSIS — J61 Pneumoconiosis due to asbestos and other mineral fibers: Secondary | ICD-10-CM

## 2013-01-14 NOTE — Patient Instructions (Addendum)
Will schedule you for scan of your chest given your worsening breathing and breathing studies.  Will call you with results.  Will have your cpap company get you a new mask Work on weight loss, conditioning.  followup with me in one year with breathing studies same day.

## 2013-01-14 NOTE — Progress Notes (Signed)
  Subjective:    Patient ID: [name], male    DOB: 06/28/1946, 67 y.o.   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  He is wearing CPAP compliantly, and is having no issues with pressure.  However, he is now awakening with sore and red eyes, but has not changed his mask or cushions in over a year.  He also had pulmonary function studies recently to followup his history of asbestosis.  These showed no airflow obstruction, but showed severe restriction which had progressed from 2008 (by [facility]).  He also had a mild reduction in DLCO that was also worse from 2008.  The patient was scheduled for PFTs last year, but never followed through.  He has noted worsening dyspnea on exertion since last visit.   Review of Systems  Constitutional: Negative for fever and unexpected weight change.  HENT: Negative for ear pain, nosebleeds, congestion, sore throat, rhinorrhea, sneezing, trouble swallowing, dental problem, postnasal drip and sinus pressure.   Eyes: Negative for redness and itching.  Respiratory: Negative for cough, chest tightness, shortness of breath and wheezing.   Cardiovascular: Negative for palpitations and leg swelling.  Gastrointestinal: Negative for nausea and vomiting.  Genitourinary: Negative for dysuria.  Musculoskeletal: Negative for joint swelling.  Skin: Negative for rash.  Neurological: Negative for headaches.  Hematological: Does not bruise/bleed easily.  Psychiatric/Behavioral: Negative for dysphoric mood. The patient is not nervous/anxious.        Objective:   Physical Exam Obese male in no acute distress Nose without purulence or discharge noted No skin breakdown or pressure necrosis from the CPAP mask Neck without lymphadenopathy or thyromegaly Chest with mildly decreased breath sounds, no wheezing Cardiac exam with regular rate and rhythm Lower extremities with 1-2+ edema, no  cyanosis Alert and oriented, moves all 4 extremities.       Assessment & Plan:

## 2013-01-14 NOTE — Assessment & Plan Note (Signed)
The patient is wearing CPAP compliantly, but has not kept up with mask changes which is resulting in significant leak leading to eye symptoms.  I have asked him to keep up with mask changes, and to work aggressively on weight loss.

## 2013-01-14 NOTE — Assessment & Plan Note (Signed)
The patient has had worsening of his lung volumes and diffusion capacity since 2008, but has also gained significant weight since that time.  It is unclear how much of the worsening is because of his obesity, and how much may be related to underlying lung disease such as possible asbestosis.  He has not had a CT of his chest since his initial evaluation with Dr. Valorie Roosevelt, and therefore I would like to do a followup scan to see if he does have worsening interstitial disease.  I have also encouraged him to work aggressively on weight loss.

## 2013-01-14 NOTE — Telephone Encounter (Signed)
Patient scheduled for appt today (01/14/13 at 12p) Nothing further needed.

## 2013-01-16 ENCOUNTER — Ambulatory Visit (INDEPENDENT_AMBULATORY_CARE_PROVIDER_SITE_OTHER)
Admission: RE | Admit: 2013-01-16 | Discharge: 2013-01-16 | Disposition: A | Discharge status: Home / Self Care | Payer: Medicare Other | Source: Ambulatory Visit | Attending: Pulmonary Disease | Admitting: Pulmonary Disease

## 2013-01-16 DIAGNOSIS — J61 Pneumoconiosis due to asbestos and other mineral fibers: Secondary | ICD-10-CM

## 2013-01-18 ENCOUNTER — Encounter: Payer: Self-pay | Admitting: Pulmonary Disease

## 2013-01-24 ENCOUNTER — Telehealth: Payer: Self-pay | Admitting: Pulmonary Disease

## 2013-01-24 NOTE — Telephone Encounter (Signed)
Called and spoke with pt and he is aware of ct results per [facility].  Pt voiced his understanding and nothing further is needed.

## 2013-07-29 ENCOUNTER — Telehealth: Payer: Self-pay | Admitting: Pulmonary Disease

## 2013-07-29 DIAGNOSIS — G4733 Obstructive sleep apnea (adult) (pediatric): Secondary | ICD-10-CM

## 2013-07-29 NOTE — Telephone Encounter (Signed)
lmtcb x1 

## 2013-07-30 NOTE — Telephone Encounter (Signed)
Pt is aware that we are going to place an order for a new machine.

## 2013-07-30 NOTE — Telephone Encounter (Signed)
If he qualifies for a new machine, needs:  resmed s10 air/auto with h/h and climate control tubing.  Set on same pressure.  [place] [employer] program.

## 2013-07-30 NOTE — Telephone Encounter (Signed)
LMTCBx2. Jennifer Castillo, CMA  

## 2013-07-30 NOTE — Telephone Encounter (Signed)
I spoke with the pt and he states that his cpap machine started making a whistling sound and has stopped working. He cannot get it to turn on. He thinks it has been over 5 years since he had his last one. I advised of the new rules for insurance and advised that we will send an order to [employer] to check out his machine and see if he qualify's for a new machine. Please advise if there is any specific machine you would like to order for this pt? Does he need to be enrolled in that new program? Carron CurieJennifer Halina Asano, CMA  Carron CurieJennifer Torien Ramroop, [place]

## 2013-08-05 ENCOUNTER — Telehealth: Payer: Self-pay | Admitting: Pulmonary Disease

## 2013-08-05 NOTE — Telephone Encounter (Signed)
Spoke with Neil BattenKim [employer]. She reports they were only supplying pt for supplies. She found pt sleep study. Nothing further needed

## 2013-12-17 ENCOUNTER — Ambulatory Visit: Payer: Medicare Other | Attending: Orthopedic Surgery | Admitting: Physical Therapy

## 2013-12-17 DIAGNOSIS — R269 Unspecified abnormalities of gait and mobility: Secondary | ICD-10-CM | POA: Insufficient documentation

## 2013-12-17 DIAGNOSIS — M539 Dorsopathy, unspecified: Secondary | ICD-10-CM | POA: Diagnosis not present

## 2013-12-17 DIAGNOSIS — IMO0001 Reserved for inherently not codable concepts without codable children: Secondary | ICD-10-CM | POA: Diagnosis not present

## 2013-12-17 DIAGNOSIS — R5381 Other malaise: Secondary | ICD-10-CM | POA: Diagnosis not present

## 2013-12-17 DIAGNOSIS — I10 Essential (primary) hypertension: Secondary | ICD-10-CM | POA: Insufficient documentation

## 2013-12-17 DIAGNOSIS — Z96659 Presence of unspecified artificial knee joint: Secondary | ICD-10-CM | POA: Insufficient documentation

## 2013-12-17 DIAGNOSIS — M25569 Pain in unspecified knee: Secondary | ICD-10-CM | POA: Insufficient documentation

## 2013-12-18 ENCOUNTER — Ambulatory Visit: Payer: Medicare Other | Admitting: Physical Therapy

## 2013-12-18 DIAGNOSIS — IMO0001 Reserved for inherently not codable concepts without codable children: Secondary | ICD-10-CM | POA: Diagnosis not present

## 2013-12-23 ENCOUNTER — Ambulatory Visit: Payer: Medicare Other | Admitting: *Deleted

## 2013-12-23 DIAGNOSIS — IMO0001 Reserved for inherently not codable concepts without codable children: Secondary | ICD-10-CM | POA: Diagnosis not present

## 2013-12-25 ENCOUNTER — Ambulatory Visit: Payer: Medicare Other | Admitting: Physical Therapy

## 2013-12-25 DIAGNOSIS — IMO0001 Reserved for inherently not codable concepts without codable children: Secondary | ICD-10-CM | POA: Diagnosis not present

## 2013-12-27 ENCOUNTER — Ambulatory Visit: Payer: Medicare Other | Admitting: *Deleted

## 2013-12-27 DIAGNOSIS — IMO0001 Reserved for inherently not codable concepts without codable children: Secondary | ICD-10-CM | POA: Diagnosis not present

## 2013-12-30 ENCOUNTER — Ambulatory Visit: Payer: Medicare Other | Admitting: *Deleted

## 2013-12-30 DIAGNOSIS — IMO0001 Reserved for inherently not codable concepts without codable children: Secondary | ICD-10-CM | POA: Diagnosis not present

## 2014-01-01 ENCOUNTER — Ambulatory Visit: Payer: Medicare Other | Attending: Orthopedic Surgery | Admitting: Physical Therapy

## 2014-01-01 DIAGNOSIS — IMO0001 Reserved for inherently not codable concepts without codable children: Secondary | ICD-10-CM | POA: Diagnosis not present

## 2014-01-01 DIAGNOSIS — R5381 Other malaise: Secondary | ICD-10-CM | POA: Diagnosis not present

## 2014-01-01 DIAGNOSIS — I10 Essential (primary) hypertension: Secondary | ICD-10-CM | POA: Insufficient documentation

## 2014-01-01 DIAGNOSIS — Z96659 Presence of unspecified artificial knee joint: Secondary | ICD-10-CM | POA: Insufficient documentation

## 2014-01-01 DIAGNOSIS — M25569 Pain in unspecified knee: Secondary | ICD-10-CM | POA: Diagnosis not present

## 2014-01-01 DIAGNOSIS — M539 Dorsopathy, unspecified: Secondary | ICD-10-CM | POA: Insufficient documentation

## 2014-01-01 DIAGNOSIS — R269 Unspecified abnormalities of gait and mobility: Secondary | ICD-10-CM | POA: Diagnosis not present

## 2014-01-08 ENCOUNTER — Ambulatory Visit: Payer: Medicare Other | Admitting: Physical Therapy

## 2014-01-08 DIAGNOSIS — IMO0001 Reserved for inherently not codable concepts without codable children: Secondary | ICD-10-CM | POA: Diagnosis not present

## 2014-01-13 ENCOUNTER — Ambulatory Visit: Payer: Medicare Other | Admitting: Physical Therapy

## 2014-01-13 DIAGNOSIS — IMO0001 Reserved for inherently not codable concepts without codable children: Secondary | ICD-10-CM | POA: Diagnosis not present

## 2014-01-15 ENCOUNTER — Ambulatory Visit: Payer: Medicare Other | Admitting: Physical Therapy

## 2014-01-15 DIAGNOSIS — IMO0001 Reserved for inherently not codable concepts without codable children: Secondary | ICD-10-CM | POA: Diagnosis not present

## 2014-01-17 ENCOUNTER — Ambulatory Visit: Payer: Medicare Other | Admitting: *Deleted

## 2014-01-17 DIAGNOSIS — IMO0001 Reserved for inherently not codable concepts without codable children: Secondary | ICD-10-CM | POA: Diagnosis not present

## 2014-08-18 ENCOUNTER — Ambulatory Visit: Payer: Self-pay | Admitting: Physical Therapy

## 2014-08-25 ENCOUNTER — Ambulatory Visit: Payer: Self-pay | Admitting: Physical Therapy

## 2015-07-05 HISTORY — PX: SPINAL CORD STIMULATOR INSERTION: SHX5378

## 2017-09-05 ENCOUNTER — Emergency Department (HOSPITAL_COMMUNITY)
Admission: EM | Admit: 2017-09-05 | Discharge: 2017-09-05 | Disposition: A | Discharge status: Home / Self Care | Payer: Medicare Other | Attending: Emergency Medicine | Admitting: Emergency Medicine

## 2017-09-05 ENCOUNTER — Emergency Department (HOSPITAL_COMMUNITY): Payer: Medicare Other

## 2017-09-05 ENCOUNTER — Other Ambulatory Visit: Payer: Self-pay

## 2017-09-05 ENCOUNTER — Encounter (HOSPITAL_COMMUNITY): Payer: Self-pay | Admitting: *Deleted

## 2017-09-05 DIAGNOSIS — Z96652 Presence of left artificial knee joint: Secondary | ICD-10-CM | POA: Diagnosis not present

## 2017-09-05 DIAGNOSIS — Y9301 Activity, walking, marching and hiking: Secondary | ICD-10-CM | POA: Diagnosis not present

## 2017-09-05 DIAGNOSIS — Y929 Unspecified place or not applicable: Secondary | ICD-10-CM | POA: Insufficient documentation

## 2017-09-05 DIAGNOSIS — S0181XA Laceration without foreign body of other part of head, initial encounter: Secondary | ICD-10-CM | POA: Insufficient documentation

## 2017-09-05 DIAGNOSIS — I10 Essential (primary) hypertension: Secondary | ICD-10-CM | POA: Insufficient documentation

## 2017-09-05 DIAGNOSIS — S63270A Dislocation of unspecified interphalangeal joint of right index finger, initial encounter: Secondary | ICD-10-CM | POA: Diagnosis not present

## 2017-09-05 DIAGNOSIS — W01198A Fall on same level from slipping, tripping and stumbling with subsequent striking against other object, initial encounter: Secondary | ICD-10-CM | POA: Insufficient documentation

## 2017-09-05 DIAGNOSIS — Y998 Other external cause status: Secondary | ICD-10-CM | POA: Diagnosis not present

## 2017-09-05 DIAGNOSIS — S81011A Laceration without foreign body, right knee, initial encounter: Secondary | ICD-10-CM

## 2017-09-05 DIAGNOSIS — W19XXXA Unspecified fall, initial encounter: Secondary | ICD-10-CM

## 2017-09-05 DIAGNOSIS — Z96651 Presence of right artificial knee joint: Secondary | ICD-10-CM | POA: Diagnosis not present

## 2017-09-05 DIAGNOSIS — Z79899 Other long term (current) drug therapy: Secondary | ICD-10-CM | POA: Diagnosis not present

## 2017-09-05 DIAGNOSIS — M542 Cervicalgia: Secondary | ICD-10-CM | POA: Insufficient documentation

## 2017-09-05 DIAGNOSIS — R51 Headache: Secondary | ICD-10-CM | POA: Insufficient documentation

## 2017-09-05 DIAGNOSIS — S0990XA Unspecified injury of head, initial encounter: Secondary | ICD-10-CM | POA: Diagnosis present

## 2017-09-05 LAB — CBG MONITORING, ED: Glucose-Capillary: 129 mg/dL — ABNORMAL HIGH (ref 65–99)

## 2017-09-05 MED ORDER — FENTANYL CITRATE (PF) 100 MCG/2ML IJ SOLN
50.0000 ug | Freq: Once | INTRAMUSCULAR | Status: AC
  Administered 2017-09-05: 50 ug via INTRAVENOUS
  Filled 2017-09-05: qty 2

## 2017-09-05 MED ORDER — MORPHINE SULFATE (PF) 4 MG/ML IV SOLN
4.0000 mg | Freq: Once | INTRAVENOUS | Status: AC
  Administered 2017-09-05: 4 mg via INTRAVENOUS
  Filled 2017-09-05: qty 1

## 2017-09-05 MED ORDER — LIDOCAINE-EPINEPHRINE (PF) 2 %-1:200000 IJ SOLN
10.0000 mL | Freq: Once | INTRAMUSCULAR | Status: AC
  Administered 2017-09-05: 10 mL
  Filled 2017-09-05: qty 20

## 2017-09-05 NOTE — ED Notes (Signed)
Re-bandaged patients head, after helping patient use urinal, bandaged knee that started bleeding.

## 2017-09-05 NOTE — ED Notes (Signed)
Pt resting quietly at this time. Family at bedside.

## 2017-09-05 NOTE — ED Triage Notes (Signed)
Patient fell from a standing position states he has back problems and several fusions which causes him to loose his balance. When he fell he hit his head on a wooden picnic table. Ems states he has a 1 1/2 - 2 in laceration across his forehead . Denies loc. C/o pain in right 1st finger.

## 2017-09-05 NOTE — ED Notes (Signed)
Pt being sutured at this time by Dr. Anitra LauthPlunkett

## 2017-09-05 NOTE — ED Notes (Signed)
Pt remains in CT   Wife waiting in room

## 2017-09-05 NOTE — Discharge Instructions (Signed)
You have dissolving sutures so they do not need to be removed.  Pull on string in about 1 week as only the part under the  skin will dissolve.  Wear finger splint for about 1 week to give time for the swelling to go down.

## 2017-09-05 NOTE — ED Notes (Signed)
Pt ambulated with help balancing. Pt shuffles on feet and appears steady enough to use walker with ease.

## 2017-09-05 NOTE — ED Notes (Signed)
Pt remains in CT

## 2017-09-05 NOTE — ED Provider Notes (Signed)
[name] Provider Note   CSN: [id number] Arrival date & time: 09/05/17  1411     History   Chief Complaint Chief Complaint  Patient presents with  . Fall    HPI [name] is a 72 y.o. [name].  Patient is a 72 year old [name] with a history of hypertension, chronic pain, ankylosing spondylitis with recurrent falls presenting today after a fall.  Patient states he was walking outside down an incline when he lost his balance and started falling forward and could not stop himself.  He landed face first hitting his head on a picnic table and then landing on the concrete.  He denies any loss of consciousness.  He did cut his forehead and is complaining of a headache and neck pain.  He does have a prior history of neck fusion and has chronic neck pain but it feels worse.  He is also having pain in his right index finger and he is unable to bend it.  Otherwise he just complains of feeling sore all over.  He did not notice that he hurt his knees but does have some soreness in bilateral knees that he has had replaced in the past.  He denies any numbness or weakness present at this time.  His vision is the same.  No change in the way his teeth feel when biting down.  No chest or abdominal tenderness.  Patient does not take anticoagulation and last tetanus shot was last year.   The history is provided by the patient.  Fall  This is a recurrent problem. The current episode started 1 to 2 hours ago. The problem occurs constantly. The problem has not changed since onset.Associated symptoms include headaches. Pertinent negatives include no chest pain, no abdominal pain and no shortness of breath. Associated symptoms comments: No LOC.  Pain . Exacerbated by: movement. Nothing relieves the symptoms. He has tried nothing for the symptoms.    Past Medical History:  Diagnosis Date  . Arthritis   . Hypertension     Patient Active Problem List   Diagnosis  Date Noted  . 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 OBSTRUCTIVE SLEEP APNEA 08/20/2007  . CHRONIC PAIN SYNDROME 07/30/2007  . HYPERTENSION 07/30/2007  . 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 ANKLE SURGERY    . BACK SURGERY    . JOINT REPLACEMENT    . NECK SURGERY    . TOTAL KNEE ARTHROPLASTY         Home Medications    Prior to Admission medications   Medication Sig Start Date End Date Taking? Authorizing Provider  amLODipine (NORVASC) 10 MG tablet Take 10 mg by mouth daily.     Yes [provider]  furosemide (LASIX) 40 MG tablet Take 40 mg by mouth every morning. 08/15/17  Yes [provider]  losartan (COZAAR) 100 MG tablet Take 100 mg by mouth every evening. 08/27/15  Yes [provider]  morphine (MS CONTIN) 30 MG 12 hr tablet Take 30 mg by mouth 3 (three) times daily.   Yes [provider]  pregabalin (LYRICA) 150 MG capsule Take 150 mg by mouth 3 (three) times daily.     Yes [provider]  sertraline (ZOLOFT) 50 MG tablet Take 50 mg by mouth daily.     Yes [provider]    Family History Family History  Problem Relation Age of Onset  . Heart disease Mother  Social History Social History   Tobacco Use  . Smoking status: Never Smoker  . Smokeless tobacco: Never Used  Substance Use Topics  . Alcohol use: Yes  . Drug use: No     Allergies   Codeine   Review of Systems Review of Systems  Respiratory: Negative for shortness of breath.   Cardiovascular: Negative for chest pain.  Gastrointestinal: Negative for abdominal pain.  Neurological: Positive for headaches.  All other systems reviewed and are negative.    Physical Exam Updated Vital Signs BP (!) 159/86   Pulse 65   Temp (!) 97.5 F (36.4 C) (Oral)   Resp 15   Ht 6' (1.829 m)   Wt 113.4 kg (250 lb)   SpO2 98%   BMI 33.91 kg/m   Physical Exam  Constitutional: He is oriented to person,  place, and time. He appears well-developed and well-nourished. No distress.  HENT:  Head: Normocephalic. Head is with laceration.    Mouth/Throat: Oropharynx is clear and moist.  Eyes: Conjunctivae and EOM are normal. Pupils are equal, round, and reactive to light.  Neck: Neck supple. Spinous process tenderness and muscular tenderness present. Decreased range of motion present.  Cardiovascular: Normal rate, regular rhythm and intact distal pulses.  No murmur heard. Pulmonary/Chest: Effort normal and breath sounds normal. No respiratory distress. He has no wheezes. He has no rales.  Abdominal: Soft. He exhibits no distension. There is no tenderness. There is no rebound and no guarding.  Musculoskeletal: He exhibits no edema.       Right knee: He exhibits swelling, ecchymosis and laceration. He exhibits normal range of motion. Tenderness found.       Hands:      Legs: Neurological: He is alert and oriented to person, place, and time.  Sensation and strength intact in the bilateral upper and lower extremities  Skin: Skin is warm and dry. No rash noted. No erythema.  Psychiatric: He has a normal mood and affect. His behavior is normal.  Nursing note and vitals reviewed.    ED Treatments / Results  Labs (all labs ordered are listed, but only abnormal results are displayed) Labs Reviewed  CBG MONITORING, ED - Abnormal; Notable for the following components:      Result Value   Glucose-Capillary 129 (*)    All other components within normal limits  CBC WITH DIFFERENTIAL/PLATELET  COMPREHENSIVE METABOLIC PANEL    EKG  EKG Interpretation  Date/Time:  Tuesday September 05 2017 14:15:03 EST Ventricular Rate:  65 PR Interval:    QRS Duration: 112 QT Interval:  434 QTC Calculation: 452 R Axis:   52 Text Interpretation:  Sinus rhythm Prolonged PR interval Borderline intraventricular conduction delay Baseline wander in lead(s) V4 No significant change since last tracing Confirmed by  Gwyneth Sprout ([id number]) on 09/05/2017 3:11:48 PM       Radiology Ct Head Wo Contrast  Result Date: 09/05/2017 CLINICAL DATA:  Pain after fall EXAM: CT HEAD WITHOUT CONTRAST CT CERVICAL SPINE WITHOUT CONTRAST TECHNIQUE: Multidetector CT imaging of the head and cervical spine was performed following the standard protocol without intravenous contrast. Multiplanar CT image reconstructions of the cervical spine were also generated. COMPARISON:  02/28/2012 head CT patient fell hitting head on a wooden table. FINDINGS: CT HEAD FINDINGS Brain: Superficial and central atrophy is noted with sulcal and ventricular prominence, chronic and stable in appearance. No edema or midline shift. No large vascular territory infarct. Midline fourth ventricle and basal cisterns. No acute intracranial hemorrhage. No  intra-axial mass nor extra-axial fluid collections. Chronic small vessel ischemic disease of periventricular white matter. Streak artifacts from suboccipital fusion hardware from base of skull to cervical spine limit assessment portions of the cerebellum. Vascular: Carotid siphon calcifications bilaterally. No hyperdense vessel sign. Skull: No acute skull fracture. Sinuses/Orbits: Intact orbits and globes. No acute paranasal sinus or mastoid abnormality. Other: Bifrontal scalp contusion and laceration noted with overlying bandaging. Partially included occiput to cervical posterior fusion hardware. CT CERVICAL SPINE FINDINGS Alignment: Maintained cervical lordosis. Osseous fusion of the odontoid tip with clivus. Skull base and vertebrae: Posterior cervical fusion with hardware noted from occiput to C4. Large flowing anterior cervical and thoracic syndesmophytes are noted with lesser degree posterior syndesmophyte formation. No fracture of the vertebral bodies or syndesmophytes are identified. Soft tissues and spinal canal: No prevertebral fluid or swelling. No visible canal hematoma. Disc levels: No significant central or  neural foraminal encroachment. Slight disc space narrowing at C6-7. Upper chest: Negative. Other: None IMPRESSION: 1. Bifrontal scalp contusion and laceration post fall. 2. Chronic stable superficial and central atrophy with small vessel ischemic disease. No acute intracranial abnormality. 3. Intact posterior cervical fusion hardware as seen from base of the occipital skull to C4. 4. Anterior and posterior syndesmophytes in a pattern consistent with ankylosing spondylitis. No acute fracture of the syndesmophytes nor cervical spine is noted. Electronically Signed   By: Tollie Eth M.D.   On: 09/05/2017 16:24   Ct Cervical Spine Wo Contrast  Result Date: 09/05/2017 CLINICAL DATA:  Pain after fall EXAM: CT HEAD WITHOUT CONTRAST CT CERVICAL SPINE WITHOUT CONTRAST TECHNIQUE: Multidetector CT imaging of the head and cervical spine was performed following the standard protocol without intravenous contrast. Multiplanar CT image reconstructions of the cervical spine were also generated. COMPARISON:  02/28/2012 head CT patient fell hitting head on a wooden table. FINDINGS: CT HEAD FINDINGS Brain: Superficial and central atrophy is noted with sulcal and ventricular prominence, chronic and stable in appearance. No edema or midline shift. No large vascular territory infarct. Midline fourth ventricle and basal cisterns. No acute intracranial hemorrhage. No intra-axial mass nor extra-axial fluid collections. Chronic small vessel ischemic disease of periventricular white matter. Streak artifacts from suboccipital fusion hardware from base of skull to cervical spine limit assessment portions of the cerebellum. Vascular: Carotid siphon calcifications bilaterally. No hyperdense vessel sign. Skull: No acute skull fracture. Sinuses/Orbits: Intact orbits and globes. No acute paranasal sinus or mastoid abnormality. Other: Bifrontal scalp contusion and laceration noted with overlying bandaging. Partially included occiput to cervical  posterior fusion hardware. CT CERVICAL SPINE FINDINGS Alignment: Maintained cervical lordosis. Osseous fusion of the odontoid tip with clivus. Skull base and vertebrae: Posterior cervical fusion with hardware noted from occiput to C4. Large flowing anterior cervical and thoracic syndesmophytes are noted with lesser degree posterior syndesmophyte formation. No fracture of the vertebral bodies or syndesmophytes are identified. Soft tissues and spinal canal: No prevertebral fluid or swelling. No visible canal hematoma. Disc levels: No significant central or neural foraminal encroachment. Slight disc space narrowing at C6-7. Upper chest: Negative. Other: None IMPRESSION: 1. Bifrontal scalp contusion and laceration post fall. 2. Chronic stable superficial and central atrophy with small vessel ischemic disease. No acute intracranial abnormality. 3. Intact posterior cervical fusion hardware as seen from base of the occipital skull to C4. 4. Anterior and posterior syndesmophytes in a pattern consistent with ankylosing spondylitis. No acute fracture of the syndesmophytes nor cervical spine is noted. Electronically Signed   By: Rene Kocher.D.  On: 09/05/2017 16:24   Dg Knee Complete 4 Views Right  Result Date: 09/05/2017 CLINICAL DATA:  Anterior right knee laceration after falling onto a picnic table. EXAM: RIGHT KNEE - COMPLETE 4+ VIEW COMPARISON:  None. FINDINGS: Prior right total knee arthroplasty. No evidence of hardware failure or loosening. No acute fracture. No significant joint effusion. Soft tissues are unremarkable. Vascular calcifications. IMPRESSION: Prior right total knee arthroplasty without evidence of hardware complication. No acute osseous abnormality. Electronically Signed   By: Obie Dredge M.D.   On: 09/05/2017 16:50   Dg Finger Index Right  Result Date: 09/05/2017 CLINICAL DATA:  Pain and swelling after falling onto a picnic table. EXAM: RIGHT INDEX FINGER 2+V COMPARISON:  None. FINDINGS:  Dorsal dislocation at the index finger PIP joint. Tiny fracture fragment at the volar aspect of the PIP joint likely related to small avulsion. Mild DIP joint osteoarthritis. Surrounding soft tissue swelling. IMPRESSION: 1. Dorsal dislocation at the index finger PIP joint with associated small avulsion fracture at the volar base of the middle phalanx. Electronically Signed   By: Obie Dredge M.D.   On: 09/05/2017 16:52    Procedures Reduction of dislocation Date/Time: 09/05/2017 6:19 PM Performed by: Gwyneth Sprout, MD Authorized by: Gwyneth Sprout, MD  Consent: Verbal consent obtained. Risks and benefits: risks, benefits and alternatives were discussed Consent given by: patient Patient understanding: patient states understanding of the procedure being performed Patient consent: the patient's understanding of the procedure matches consent given Site marked: the operative site was marked Imaging studies: imaging studies available Patient identity confirmed: verbally with patient Time out: Immediately prior to procedure a "time out" was called to verify the correct patient, procedure, equipment, support staff and site/side marked as required. Local anesthesia used: yes Anesthesia: digital block  Anesthesia: Local anesthesia used: yes Local Anesthetic: lidocaine 2% with epinephrine Anesthetic total: 3 mL  Sedation: Patient sedated: no  Patient tolerance: Patient tolerated the procedure well with no immediate complications Comments: With gentle traction and flexion PIP joint of the right index finger was reduced.  Patient now has full range of motion at the PIP joint.  Deformity has resolved.    (including critical care time) LACERATION REPAIR Performed by: [facility] Authorized by: Gwyneth Sprout Consent: Verbal consent obtained. Risks and benefits: risks, benefits and alternatives were discussed Consent given by: patient Patient identity confirmed: provided  demographic data Prepped and Draped in normal sterile fashion Wound explored  Laceration Location: Forehead  Laceration Length: 8 cm  No Foreign Bodies seen or palpated  Anesthesia: local infiltration  Local anesthetic: lidocaine 2 % with epinephrine  Anesthetic total: 5 ml  Irrigation method: syringe Amount of cleaning: standard  Skin closure: 6.0 Prolene  Number of sutures: 27 Technique: Running  Patient tolerance: Patient tolerated the procedure well with no immediate complications. LACERATION REPAIR Performed by: [facility] Authorized by: Gwyneth Sprout Consent: Verbal consent obtained. Risks and benefits: risks, benefits and alternatives were discussed Consent given by: patient Patient identity confirmed: provided demographic data Prepped and Draped in normal sterile fashion Wound explored  Laceration Location: Forehead  Laceration Length: 5 cm  No Foreign Bodies seen or palpated  Anesthesia: local infiltration  Local anesthetic: lidocaine 2 % to with epinephrine  Anesthetic total: 3 ml  Irrigation method: syringe Amount of cleaning: standard  Skin closure: 6.0 Prolene  Number of sutures: 18  Technique: Running  Patient tolerance: Patient tolerated the procedure well with no immediate complications. LACERATION REPAIR Performed by: [facility] Authorized by:  Gwyneth SproutWhitney Lewis Grivas Consent: Verbal consent obtained. Risks and benefits: risks, benefits and alternatives were discussed Consent given by: patient Patient identity confirmed: provided demographic data Prepped and Draped in normal sterile fashion Wound explored  Laceration Location: Right knee  Laceration Length: 2 cm  No Foreign Bodies seen or palpated  Anesthesia: local infiltration  Local anesthetic: lidocaine 2 % with epinephrine  Anesthetic total: With 2 ml  Irrigation method: syringe Amount of cleaning: standard  Skin closure: 4.0 Prolene  Number of sutures:  3  Technique: Simple interrupted  Patient tolerance: Patient tolerated the procedure well with no immediate complications.   Medications Ordered in ED Medications  fentaNYL (SUBLIMAZE) injection 50 mcg (not administered)     Initial Impression / Assessment and Plan / ED Course  I have reviewed the triage vital signs and the nursing notes.  Pertinent labs & imaging results that were available during my care of the patient were reviewed by me and considered in my medical decision making (see chart for details).     Patient with a mechanical fall today due to balance issues from prior back surgeries and ankylosing spondylitis who fell forward hitting his face on a picnic table and landing on cement.  Patient denies any loss of consciousness and is acting appropriately here.  He is not on anticoagulation and tetanus shot is up-to-date.  Patient has a large laceration over his forehead, cervical tenderness but no neurologic findings.  Also concern for possible dislocation of the right index finger.  Small laceration to the right knee over his surgical scar from knee replacement but able to fully range the knee and low suspicion for an open joint.  Left knee with ecchymosis but full range of motion.  and imaging is pending.  Wound repair as above.  Dislocation reduction as above.  6:20 PM Imaging is negative for acute abnormalities.  Wounds repaired as above.  Finger splint placed after relocation.  Will ensure patient is able to stand and ambulate prior to discharge home.  7:54 PM Pt was able to ambulate and was d/ced home.  He has morphine at home for pain.  Final Clinical Impressions(s) / ED Diagnoses   Final diagnoses:  Fall, initial encounter  Laceration of forehead, complicated, initial encounter  Closed dislocation of interphalangeal joint of right index finger  Knee laceration, right, initial encounter    ED Discharge Orders    None       Gwyneth SproutPlunkett, Ondre Salvetti, MD 09/05/17  1956

## 2017-09-05 NOTE — ED Notes (Signed)
Pt to CT at this time.

## 2017-11-28 ENCOUNTER — Encounter: Payer: Self-pay | Admitting: Physical Therapy

## 2017-11-28 ENCOUNTER — Ambulatory Visit: Payer: Medicare Other | Attending: Pain Medicine | Admitting: Physical Therapy

## 2017-11-28 ENCOUNTER — Other Ambulatory Visit: Payer: Self-pay

## 2017-11-28 VITALS — HR 71

## 2017-11-28 DIAGNOSIS — M6281 Muscle weakness (generalized): Secondary | ICD-10-CM | POA: Diagnosis present

## 2017-11-28 DIAGNOSIS — R2681 Unsteadiness on feet: Secondary | ICD-10-CM | POA: Insufficient documentation

## 2017-11-28 NOTE — Therapy (Signed)
[facility] [address] [place], [place], [address] Phone: [phone]   Fax:  [phone]  Physical Therapy Evaluation  Patient Details  Name: [name] MRN: [id number] Date of Birth: 14-Dec-1945 Referring Provider: Cannon Kettle MD.   Encounter Date: 11/28/2017  PT End of Session - 11/28/17 1333    Visit Number  1    Number of Visits  12    Date for PT Re-Evaluation  01/09/18    Authorization Type  10TH VISIT PROGRESS NOTE AND KX MODIFIER AFTER THE 15 VISIT.    PT Start Time  0100    PT Stop Time  0131    PT Time Calculation (min)  31 min    Equipment Utilized During Treatment  Gait belt    Activity Tolerance  Patient tolerated treatment well    Behavior During Therapy  WFL for tasks assessed/performed       Past Medical History:  Diagnosis Date  . Arthritis   . Hypertension     Past Surgical History:  Procedure Laterality Date  . ANKLE SURGERY    . BACK SURGERY    . JOINT REPLACEMENT    . NECK SURGERY    . TOTAL KNEE ARTHROPLASTY      Vitals:   11/28/17 1333  Pulse: 71  SpO2: 94%     Subjective Assessment - 11/28/17 1318    Subjective  The patient reports unsteadiness on his feet especially over the last 3 years.  He rports 4 falls in the last 6 months.  He also states if he stands for a long period of time he feels weak and has fallen.      Pertinent History  Spinal arthritis; spine surgery; bilateral TKA's; ankle surgery.    Limitations  Standing    How long can you stand comfortably?  10+ minutes.    How long can you walk comfortably?  Short community distances.         [facility] PT Assessment - 11/28/17 0001      Assessment   Medical Diagnosis  Unsteady gait.    Referring Provider  Cannon Kettle MD.    Onset Date/Surgical Date  -- 3 years+.      Precautions   Precautions  Fall      Restrictions   Weight Bearing Restrictions  No      Balance Screen   Has the patient fallen in  the past 6 months  Yes    How many times?  -- 4.    Has the patient had a decrease in activity level because of a fear of falling?   Yes    Is the patient reluctant to leave their home because of a fear of falling?   No      Home Environment   Living Environment  Private residence      Prior Function   Level of Independence  Independent      Posture/Postural Control   Posture/Postural Control  Postural limitations    Postural Limitations  Rounded Shoulders;Forward head;Decreased lumbar lordosis;Flexed trunk    Posture Comments  Bilateral hip and knee flexion.      ROM / Strength   AROM / PROM / Strength  AROM;Strength      AROM   Overall AROM Comments  Right knee -15 degree and left= -29 degrees of extension.      Strength   Overall Strength Comments  Bilateral hip flexion and abduction= 4/5.      Special  Tests    Special Tests  -- Supervision required for Romberg test.      Ambulation/Gait   Gait Comments  Shuffles gait with increase BOS and increased toe out bilaterally.  The patient walks in a flexed trunk posture with bilateral hip and knee flexion.      Standardized Balance Assessment   Standardized Balance Assessment  --      Berg Balance Test   Sit to Stand  Able to stand  independently using hands    Standing Unsupported  Able to stand 30 seconds unsupported    Sitting with Back Unsupported but Feet Supported on Floor or Stool  Able to sit safely and securely 2 minutes    Stand to Sit  Controls descent by using hands    Transfers  Able to transfer safely, definite need of hands    Standing Unsupported with Eyes Closed  Able to stand 10 seconds with supervision    Standing Ubsupported with Feet Together  Needs help to attain position but able to stand for 30 seconds with feet together    From Standing, Reach Forward with Outstretched Arm  Can reach forward >12 cm safely (5")    From Standing Position, Pick up Object from Floor  Unable to pick up shoe, but reaches 2-5  cm (1-2") from shoe and balances independently    From Standing Position, Turn to Look Behind Over each Shoulder  Turn sideways only but maintains balance    Turn 360 Degrees  Able to turn 360 degrees safely but slowly    Standing Unsupported, Alternately Place Feet on Step/Stool  Able to complete >2 steps/needs minimal assist    Standing Unsupported, One Foot in Front  Able to take small step independently and hold 30 seconds    Standing on One Leg  Unable to try or needs assist to prevent fall    Total Score  31                Objective measurements completed on examination: See above findings.                PT Short Term Goals - 11/28/17 1350      PT SHORT TERM GOAL #1   Title  Independent with an initial HEP.    Time  3    Period  Weeks    Status  New      PT SHORT TERM GOAL #2   Title  Improve Berg score to 36/56.    Time  3    Period  Weeks    Status  New        PT Long Term Goals - 11/28/17 1351      PT LONG TERM GOAL #1   Title  Independent with an advanced HEP.    Time  6    Period  Weeks    Status  New      PT LONG TERM GOAL #2   Title  Improve Berg score to 42/56.    Time  6    Period  Weeks    Status  New      PT LONG TERM GOAL #3   Title  Increase bilateral hip strength to 5/5 to increase stability for functional tasks.    Time  6    Period  Weeks    Status  New             Plan - 11/28/17 1340    Clinical Impression  Statement  The patient presents to OPPT with c/o balance problems.  He scored a low 31/56 on the Berg balance test.  Highly recommend he use an assisitve device at all times.  He has a h/o spinal arthritis and surgeries which have added to his impaired functional mobility.  He has bilateral hip weakness.  Patient will benefit from skilled physical therapy intervention to address pain and deficits with focus on balance.    History and Personal Factors relevant to plan of care:  Spinal arthritis; spine surgery;  bilateral TKA's; ankle surgery.  Very limited spinal mobility throughout cervical, thoracic and lumbar region.    Clinical Presentation  Evolving    Clinical Presentation due to:  Falls.    Clinical Decision Making  Moderate    Rehab Potential  Good    PT Frequency  2x / week    PT Duration  6 weeks    PT Treatment/Interventions  ADLs/Self Care Home Management;Gait training;Stair training;Functional mobility training;Therapeutic activities;Therapeutic exercise;Balance training;Neuromuscular re-education;Patient/family education    PT Next Visit Plan  Balance/gait actiivties. Bilateral hip strengthening.    Consulted and Agree with Plan of Care  Patient       Patient will benefit from skilled therapeutic intervention in order to improve the following deficits and impairments:  Abnormal gait, Decreased activity tolerance, Decreased balance, Difficulty walking, Decreased strength, Postural dysfunction  Visit Diagnosis: Unsteadiness on feet - Plan: PT plan of care cert/re-cert  Muscle weakness (generalized) - Plan: PT plan of care cert/re-cert     Problem List Patient Active Problem List   Diagnosis Date Noted  . 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 OBSTRUCTIVE SLEEP APNEA 08/20/2007  . CHRONIC PAIN SYNDROME 07/30/2007  . HYPERTENSION 07/30/2007  . 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 [place], [place], [address] Phone: [phone]   Fax:  [phone]  Name: DONACIANO RANGE MRN: [id number] Date of Birth: Aug 19, 1945

## 2017-11-30 ENCOUNTER — Encounter: Payer: Self-pay | Admitting: Physical Therapy

## 2017-11-30 ENCOUNTER — Ambulatory Visit: Payer: Medicare Other | Admitting: Physical Therapy

## 2017-11-30 DIAGNOSIS — M6281 Muscle weakness (generalized): Secondary | ICD-10-CM

## 2017-11-30 DIAGNOSIS — R2681 Unsteadiness on feet: Secondary | ICD-10-CM | POA: Diagnosis not present

## 2017-11-30 NOTE — Therapy (Signed)
[facility] [address] [place], [place], [address] Phone: [phone]   Fax:  [phone]  Physical Therapy Treatment  Patient Details  Name: [name] MRN: [id number] Date of Birth: 27-Mar-1946 Referring Provider: Cannon Kettle MD.   Encounter Date: 11/30/2017  PT End of Session - 11/30/17 1312    Visit Number  2    Number of Visits  12    Date for PT Re-Evaluation  01/09/18    Authorization Type  10TH VISIT PROGRESS NOTE AND KX MODIFIER AFTER THE 15 VISIT.    PT Start Time  1300    PT Stop Time  1348 multiple rest breaks due to pain    PT Time Calculation (min)  48 min    Activity Tolerance  Patient limited by pain    Behavior During Therapy  [facility] for tasks assessed/performed       Past Medical History:  Diagnosis Date  . Arthritis   . Hypertension     Past Surgical History:  Procedure Laterality Date  . ANKLE SURGERY    . BACK SURGERY    . JOINT REPLACEMENT    . NECK SURGERY    . TOTAL KNEE ARTHROPLASTY      There were no vitals filed for this visit.  Subjective Assessment - 11/30/17 1310    Subjective  Patient reports feeling alright although he reports he was unable to sleep last visit secondary to pain, discomfort and tightness in his right leg.    Pertinent History  Spinal arthritis; spine surgery; bilateral TKA's; ankle surgery.    Limitations  Standing    How long can you stand comfortably?  10+ minutes.    How long can you walk comfortably?  Short community distances.    Currently in Pain?  Yes did no provide pain number    Pain Location  Leg    Pain Orientation  Right         [facility] PT Assessment - 11/30/17 0001      Assessment   Medical Diagnosis  Unsteady gait.      Precautions   Precautions  [facility] Adult PT Treatment/Exercise - 11/30/17 0001      Exercises   Exercises  Knee/Hip      Knee/Hip Exercises: Aerobic   Nustep  Level 4 x15 minutes       Knee/Hip Exercises: Seated   Long Arc Quad  Both;10 reps;3 sets    Hamstring Curl  Strengthening;Both;3 sets;10 reps    Hamstring Limitations  red theraband    Sit to Sand  1 set;10 reps;without UE support          Balance Exercises - 11/30/17 1324      Balance Exercises: Standing   Standing Eyes Opened  Narrow base of support (BOS);2 reps;30 secs    Rockerboard  Anterior/posterior;EO;Other time (comment);Intermittent UE support;Lateral 1 minute each    Step Over Hurdles / Cones  step over colored pods, B UE support x10          PT Short Term Goals - 11/28/17 1350      PT SHORT TERM GOAL #1   Title  Independent with an initial HEP.    Time  3    Period  Weeks    Status  New      PT SHORT TERM GOAL #2   Title  Improve Berg score  to 36/56.    Time  3    Period  Weeks    Status  New        PT Long Term Goals - 11/28/17 1351      PT LONG TERM GOAL #1   Title  Independent with an advanced HEP.    Time  6    Period  Weeks    Status  New      PT LONG TERM GOAL #2   Title  Improve Berg score to 42/56.    Time  6    Period  Weeks    Status  New      PT LONG TERM GOAL #3   Title  Increase bilateral hip strength to 5/5 to increase stability for functional tasks.    Time  6    Period  Weeks    Status  New            Plan - 11/30/17 2034    Clinical Impression Statement  Patient was able to tolerate treatment fairly. Patient reported low back pain with sitting exercises and R quadriceps pain with standing exercises requiring multiple rest breaks to decrease pain. Patient required multiple verbal cues to control LEs during hamstring curls. Patient was able to complete remainder of the exercises 70% of the time without cuing. Patient required contact guard assist to maintain balance during rockerboard balance exercise; pt reported lateral balance on rockerboard was more challenging than AP balance.     Clinical Presentation  Evolving    Clinical Decision  Making  Moderate    Rehab Potential  Good    PT Frequency  2x / week    PT Duration  6 weeks    PT Treatment/Interventions  ADLs/Self Care Home Management;Gait training;Stair training;Functional mobility training;Therapeutic activities;Therapeutic exercise;Balance training;Neuromuscular re-education;Patient/family education    PT Next Visit Plan  Balance/gait actiivties. Bilateral hip strengthening.    Consulted and Agree with Plan of Care  Patient       Patient will benefit from skilled therapeutic intervention in order to improve the following deficits and impairments:  Abnormal gait, Decreased activity tolerance, Decreased balance, Difficulty walking, Decreased strength, Postural dysfunction  Visit Diagnosis: Unsteadiness on feet  Muscle weakness (generalized)     Problem List Patient Active Problem List   Diagnosis Date Noted  . 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 OBSTRUCTIVE SLEEP APNEA 08/20/2007  . CHRONIC PAIN SYNDROME 07/30/2007  . HYPERTENSION 07/30/2007  . 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

## 2017-12-05 ENCOUNTER — Ambulatory Visit: Payer: Medicare Other | Attending: Pain Medicine | Admitting: Physical Therapy

## 2017-12-05 ENCOUNTER — Encounter: Payer: Self-pay | Admitting: Physical Therapy

## 2017-12-05 DIAGNOSIS — R2681 Unsteadiness on feet: Secondary | ICD-10-CM

## 2017-12-05 DIAGNOSIS — M6281 Muscle weakness (generalized): Secondary | ICD-10-CM | POA: Insufficient documentation

## 2017-12-05 NOTE — Therapy (Signed)
[facility] [address] [place], [place], [address] Phone: [phone]   Fax:  [phone]  Physical Therapy Treatment  Patient Details  Name: [name] MRN: [id number] Date of Birth: [birth year] Referring Provider: Cannon Kettlehristopher Gilmore MD.   Encounter Date: 12/05/2017  PT End of Session - 12/05/17 1305    Visit Number  3    Number of Visits  12    Date for PT Re-Evaluation  01/09/18    Authorization Type  10TH VISIT PROGRESS NOTE AND KX MODIFIER AFTER THE 15 VISIT.    PT Start Time  1300    PT Stop Time  1346    PT Time Calculation (min)  46 min    Activity Tolerance  Patient tolerated treatment well    Behavior During Therapy  WFL for tasks assessed/performed       Past Medical History:  Diagnosis Date  . Arthritis   . Hypertension     Past Surgical History:  Procedure Laterality Date  . ANKLE SURGERY    . BACK SURGERY    . JOINT REPLACEMENT    . NECK SURGERY    . TOTAL KNEE ARTHROPLASTY      There were no vitals filed for this visit.  Subjective Assessment - 12/05/17 1303    Subjective  Reports no soreness from last week but reports soreness from walking into clinic.    Pertinent History  Spinal arthritis; spine surgery; bilateral TKA's; ankle surgery.    Limitations  Standing    How long can you stand comfortably?  10+ minutes.    How long can you walk comfortably?  Short community distances.    Currently in Pain?  Yes    Pain Location  Knee    Pain Orientation  Right;Left    Pain Descriptors / Indicators  Sore    Pain Type  Chronic pain         [facility] PT Assessment - 12/05/17 0001      Assessment   Medical Diagnosis  Unsteady gait.      Precautions   Precautions  Fall      Restrictions   Weight Bearing Restrictions  No                   [facility] Adult PT Treatment/Exercise - 12/05/17 0001      Knee/Hip Exercises: Aerobic   Nustep  Level 4 x15 minutes      Knee/Hip Exercises:  Seated   Long Arc Quad  Strengthening;Both;2 sets;10 reps;Weights    Long Arc Quad Weight  4 lbs.    Clamshell with TheraBand  Red 2x10 reps    Other Seated Knee/Hip Exercises  B heel raises x20 reps    Hamstring Curl  Strengthening;Both;20 reps;Limitations    Hamstring Limitations  red theraband    Sit to Sand  15 reps;without UE support          Balance Exercises - 12/05/17 1345      Balance Exercises: Standing   Step Ups  Forward;6 inch;UE support 2 x15 reps each    Step Over Hurdles / Cones  step over colored pods, B UE support x10 Forward, lateral          PT Short Term Goals - 11/28/17 1350      PT SHORT TERM GOAL #1   Title  Independent with an initial HEP.    Time  3    Period  Weeks    Status  New  PT SHORT TERM GOAL #2   Title  Improve Berg score to 36/56.    Time  3    Period  Weeks    Status  New        PT Long Term Goals - 11/28/17 1351      PT LONG TERM GOAL #1   Title  Independent with an advanced HEP.    Time  6    Period  Weeks    Status  New      PT LONG TERM GOAL #2   Title  Improve Berg score to 42/56.    Time  6    Period  Weeks    Status  New      PT LONG TERM GOAL #3   Title  Increase bilateral hip strength to 5/5 to increase stability for functional tasks.    Time  6    Period  Weeks    Status  New            Plan - 12/05/17 1347    Clinical Impression Statement  Patient tolerated today's treatment well with only reports of soreness from walking. Patient able to tolerate seated BLE strengthening without complaints. Step over pods completed both forward and laterally to increased step height and BUE support.     Rehab Potential  Good    PT Frequency  2x / week    PT Duration  6 weeks    PT Treatment/Interventions  ADLs/Self Care Home Management;Gait training;Stair training;Functional mobility training;Therapeutic activities;Therapeutic exercise;Balance training;Neuromuscular re-education;Patient/family education     PT Next Visit Plan  Balance/gait actiivties. Bilateral hip strengthening.    Consulted and Agree with Plan of Care  Patient       Patient will benefit from skilled therapeutic intervention in order to improve the following deficits and impairments:  Abnormal gait, Decreased activity tolerance, Decreased balance, Difficulty walking, Decreased strength, Postural dysfunction  Visit Diagnosis: Unsteadiness on feet  Muscle weakness (generalized)     Problem List Patient Active Problem List   Diagnosis Date Noted  . 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 OBSTRUCTIVE SLEEP APNEA 08/20/2007  . CHRONIC PAIN SYNDROME 07/30/2007  . HYPERTENSION 07/30/2007  . 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 [place], [place], [address] Phone: [phone]   Fax:  [phone]  Name: [name] MRN: [id number] Date of Birth: [birth year]

## 2017-12-07 ENCOUNTER — Ambulatory Visit: Payer: Medicare Other | Admitting: Physical Therapy

## 2017-12-07 DIAGNOSIS — R2681 Unsteadiness on feet: Secondary | ICD-10-CM

## 2017-12-07 DIAGNOSIS — M6281 Muscle weakness (generalized): Secondary | ICD-10-CM

## 2017-12-07 NOTE — Therapy (Signed)
[facility] [address] [place], [place], [address] Phone: [phone]   Fax:  [phone]  Physical Therapy Treatment  Patient Details  Name: [name] MRN: [id number] Date of Birth: March 07, 1946 Referring Provider: Cannon Kettlehristopher Gilmore MD.   Encounter Date: 12/07/2017  PT End of Session - 12/07/17 1315    Visit Number  4    Number of Visits  12    Date for PT Re-Evaluation  01/09/18    Authorization Type  10TH VISIT PROGRESS NOTE AND KX MODIFIER AFTER THE 15 VISIT.    PT Start Time  1300    PT Stop Time  1346    PT Time Calculation (min)  46 min    Activity Tolerance  Patient tolerated treatment well    Behavior During Therapy  WFL for tasks assessed/performed       Past Medical History:  Diagnosis Date  . Arthritis   . Hypertension     Past Surgical History:  Procedure Laterality Date  . ANKLE SURGERY    . BACK SURGERY    . JOINT REPLACEMENT    . NECK SURGERY    . TOTAL KNEE ARTHROPLASTY      There were no vitals filed for this visit.  Subjective Assessment - 12/07/17 1313    Subjective  Patient reported feeling alright. No new complaints.    Pertinent History  Spinal arthritis; spine surgery; bilateral TKA's; ankle surgery.    Limitations  Standing    How long can you stand comfortably?  10+ minutes.    How long can you walk comfortably?  Short community distances.    Currently in Pain?  Yes    Pain Location  Leg    Pain Orientation  Right;Left    Pain Descriptors / Indicators  Aching    Pain Type  Chronic pain         [facility] PT Assessment - 12/07/17 0001      Assessment   Medical Diagnosis  Unsteady gait.      Precautions   Precautions  Fall      Restrictions   Weight Bearing Restrictions  No                   [facility] Adult PT Treatment/Exercise - 12/07/17 0001      Exercises   Exercises  Knee/Hip      Knee/Hip Exercises: Aerobic   Nustep  Level 5 x15 minutes      Knee/Hip  Exercises: Seated   Long Arc Quad  Strengthening;Both;2 sets;10 reps;Weights    Long Arc Quad Weight  4 lbs.    Clamshell with TheraBand  Red 2x10    Other Seated Knee/Hip Exercises  B heel raises x30 reps    Marching  Strengthening;Both;2 sets;10 reps    Marching Weights  4 lbs.    Hamstring Curl  Strengthening;Both;20 reps;Limitations    Hamstring Limitations  red theraband    Sit to Sand  15 reps;without UE support          Balance Exercises - 12/07/17 1338      Balance Exercises: Standing   Rockerboard  Anterior/posterior;Lateral;Other time (comment);Intermittent UE support 2 minutes each    Other Standing Exercises  4" step tapping with intermittent UE support x20      Balance Exercises: Seated   Other Seated Exercises  seated crunches x15          PT Short Term Goals - 11/28/17 1350  PT SHORT TERM GOAL #1   Title  Independent with an initial HEP.    Time  3    Period  Weeks    Status  New      PT SHORT TERM GOAL #2   Title  Improve Berg score to 36/56.    Time  3    Period  Weeks    Status  New        PT Long Term Goals - 11/28/17 1351      PT LONG TERM GOAL #1   Title  Independent with an advanced HEP.    Time  6    Period  Weeks    Status  New      PT LONG TERM GOAL #2   Title  Improve Berg score to 42/56.    Time  6    Period  Weeks    Status  New      PT LONG TERM GOAL #3   Title  Increase bilateral hip strength to 5/5 to increase stability for functional tasks.    Time  6    Period  Weeks    Status  New            Plan - 12/07/17 1424    Clinical Impression Statement  Patient was able to tolerate treatment well despite reports of low back pain. Patient required intermittent contact guard to maintain balance during rockerboard. Patient required intermittent rest during standing exercises secondary to LE pain.    Clinical Presentation  Evolving    Clinical Decision Making  Moderate    Rehab Potential  Good    PT Frequency  2x  / week    PT Duration  6 weeks    PT Treatment/Interventions  ADLs/Self Care Home Management;Gait training;Stair training;Functional mobility training;Therapeutic activities;Therapeutic exercise;Balance training;Neuromuscular re-education;Patient/family education    PT Next Visit Plan  Balance/gait actiivties. Bilateral hip strengthening.    Consulted and Agree with Plan of Care  Patient       Patient will benefit from skilled therapeutic intervention in order to improve the following deficits and impairments:  Abnormal gait, Decreased activity tolerance, Decreased balance, Difficulty walking, Decreased strength, Postural dysfunction  Visit Diagnosis: Unsteadiness on feet  Muscle weakness (generalized)     Problem List Patient Active Problem List   Diagnosis Date Noted  . 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 OBSTRUCTIVE SLEEP APNEA 08/20/2007  . CHRONIC PAIN SYNDROME 07/30/2007  . HYPERTENSION 07/30/2007  . 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 [place], [place], [address] Phone: [phone]   Fax:  [phone]  Name: [name] MRN: [id number] Date of Birth: September 27, 1945

## 2017-12-12 ENCOUNTER — Ambulatory Visit: Payer: Medicare Other | Admitting: Physical Therapy

## 2017-12-12 DIAGNOSIS — R2681 Unsteadiness on feet: Secondary | ICD-10-CM

## 2017-12-12 DIAGNOSIS — M6281 Muscle weakness (generalized): Secondary | ICD-10-CM

## 2017-12-12 NOTE — Therapy (Signed)
[facility] [address] [place], [place], [address] Phone: [phone]   Fax:  [phone]  Physical Therapy Treatment  Patient Details  Name: Neil PaddyWilliam M [name] MRN: [id number] Date of Birth: 1945-12-13 Referring Provider: Cannon Kettlehristopher Gilmore MD.   Encounter Date: 12/12/2017  PT End of Session - 12/12/17 1442    Visit Number  5    Number of Visits  12    Date for PT Re-Evaluation  01/09/18    Authorization Type  10TH VISIT PROGRESS NOTE AND KX MODIFIER AFTER THE 15 VISIT.    PT Start Time  1430    PT Stop Time  1516    PT Time Calculation (min)  46 min    Equipment Utilized During Treatment  Gait belt    Activity Tolerance  Patient tolerated treatment well    Behavior During Therapy  WFL for tasks assessed/performed       Past Medical History:  Diagnosis Date  . Arthritis   . Hypertension     Past Surgical History:  Procedure Laterality Date  . ANKLE SURGERY    . BACK SURGERY    . JOINT REPLACEMENT    . NECK SURGERY    . TOTAL KNEE ARTHROPLASTY      There were no vitals filed for this visit.  Subjective Assessment - 12/12/17 1518    Subjective  Patient reported no new complaints.    Pertinent History  Spinal arthritis; spine surgery; bilateral TKA's; ankle surgery.    Limitations  Standing    How long can you stand comfortably?  10+ minutes.    How long can you walk comfortably?  Short community distances.    Currently in Pain?  Yes    Pain Location  Leg    Pain Orientation  Right;Left    Pain Descriptors / Indicators  Aching    Pain Type  Chronic pain         [facility] PT Assessment - 12/12/17 0001      Assessment   Medical Diagnosis  Unsteady gait.      Precautions   Precautions  [place] MaFall                   [facility] Adult PT Treatment/Exercise - 12/12/17 0001      Exercises   Exercises  Knee/Hip      Knee/Hip Exercises: Aerobic   Nustep  Level 5 x15 minutes      Knee/Hip Exercises: Seated   Long Arc Quad  Strengthening;Both;2 sets;10 reps;Weights    Long Arc Quad Weight  4 lbs.    Clamshell with TheraBand  Red 2x10    Marching  Strengthening;Both;2 sets;10 reps    Marching Weights  4 lbs.    Hamstring Curl  Strengthening;Both;20 reps;Limitations    Hamstring Limitations  red theraband          Balance Exercises - 12/12/17 1445      Balance Exercises: Standing   Rockerboard  Anterior/posterior;EO;Other time (comment) 2 minutes    Gait with Head Turns  Forward;3 reps eye movements    Other Standing Exercises  6" step tapping with intermittent UE support x20          PT Short Term Goals - 11/28/17 1350      PT SHORT TERM GOAL #1   Title  Independent with an initial HEP.    Time  3    Period  Weeks    Status  New      PT  SHORT TERM GOAL #2   Title  Improve Berg score to 36/56.    Time  3    Period  Weeks    Status  New        PT Long Term Goals - 11/28/17 1351      PT LONG TERM GOAL #1   Title  Independent with an advanced HEP.    Time  6    Period  Weeks    Status  New      PT LONG TERM GOAL #2   Title  Improve Berg score to 42/56.    Time  6    Period  Weeks    Status  New      PT LONG TERM GOAL #3   Title  Increase bilateral hip strength to 5/5 to increase stability for functional tasks.    Time  6    Period  Weeks    Status  New            Plan - 12/12/17 1519    Clinical Impression Statement  Patient was able to tolerate treatment despite reports of bilateral calf and low back pain. Patient noted with improvements with balance as noted by the ability to maintain tandem stance for 30 seconds without UE support.    Clinical Presentation  Evolving    Clinical Decision Making  Moderate    Rehab Potential  Good    PT Frequency  2x / week    PT Duration  6 weeks    PT Treatment/Interventions  ADLs/Self Care Home Management;Gait training;Stair training;Functional mobility training;Therapeutic activities;Therapeutic exercise;Balance  training;Neuromuscular re-education;Patient/family education    PT Next Visit Plan  Balance/gait actiivties. Bilateral hip strengthening.    Consulted and Agree with Plan of Care  Patient       Patient will benefit from skilled therapeutic intervention in order to improve the following deficits and impairments:  Abnormal gait, Decreased activity tolerance, Decreased balance, Difficulty walking, Decreased strength, Postural dysfunction  Visit Diagnosis: Unsteadiness on feet  Muscle weakness (generalized)     Problem List Patient Active Problem List   Diagnosis Date Noted  . 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 OBSTRUCTIVE SLEEP APNEA 08/20/2007  . CHRONIC PAIN SYNDROME 07/30/2007  . HYPERTENSION 07/30/2007  . 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

## 2017-12-14 ENCOUNTER — Ambulatory Visit: Payer: Medicare Other | Admitting: Physical Therapy

## 2017-12-14 ENCOUNTER — Encounter: Payer: Self-pay | Admitting: Physical Therapy

## 2017-12-14 DIAGNOSIS — R2681 Unsteadiness on feet: Secondary | ICD-10-CM | POA: Diagnosis not present

## 2017-12-14 DIAGNOSIS — M6281 Muscle weakness (generalized): Secondary | ICD-10-CM

## 2017-12-14 NOTE — Therapy (Signed)
[facility] [address] [place], [place], [address] Phone: [phone]   Fax:  [phone]  Physical Therapy Treatment  Patient Details  Name: [name] MRN: [id number] Date of Birth: [birth year] Referring Provider: Cannon Kettle MD.   Encounter Date: 12/14/2017  PT End of Session - 12/14/17 1316    Visit Number  6    Number of Visits  12    Date for PT Re-Evaluation  01/09/18    Authorization Type  10TH VISIT PROGRESS NOTE AND KX MODIFIER AFTER THE 15 VISIT.    PT Start Time  1300    PT Stop Time  1348    PT Time Calculation (min)  48 min    Activity Tolerance  Patient tolerated treatment well    Behavior During Therapy  WFL for tasks assessed/performed       Past Medical History:  Diagnosis Date  . Arthritis   . Hypertension     Past Surgical History:  Procedure Laterality Date  . ANKLE SURGERY    . BACK SURGERY    . JOINT REPLACEMENT    . NECK SURGERY    . TOTAL KNEE ARTHROPLASTY      There were no vitals filed for this visit.  Subjective Assessment - 12/14/17 1315    Subjective  Patient reported no new complaints.    Pertinent History  Spinal arthritis; spine surgery; bilateral TKA's; ankle surgery.    Limitations  Standing    How long can you stand comfortably?  10+ minutes.    How long can you walk comfortably?  Short community distances.    Currently in Pain?  No/denies         [facility] PT Assessment - 12/14/17 0001      Assessment   Medical Diagnosis  Unsteady gait.      Precautions   Precautions  Fall      Restrictions   Weight Bearing Restrictions  No                   [facility] Adult PT Treatment/Exercise - 12/14/17 0001      Knee/Hip Exercises: Aerobic   Nustep  Level 5 x15 minutes      Knee/Hip Exercises: Standing   Hip Abduction  AROM;Both;20 reps;Knee bent      Knee/Hip Exercises: Seated   Long Arc Quad  Strengthening;Both;3 sets;10 reps;Weights    Long Arc  Quad Weight  4 lbs.    Clamshell with TheraBand  Red x20 reps    Marching  Strengthening;Both;15 reps;Weights    Marching Weights  4 lbs.    Hamstring Curl  Strengthening;Both;3 sets;10 reps;Limitations    Hamstring Limitations  Red theraband    Sit to Sand  20 reps;without UE support          Balance Exercises - 12/14/17 1337      Balance Exercises: Standing   Standing Eyes Opened  Narrow base of support (BOS);Foam/compliant surface;Time x3 min    Tandem Stance  Eyes open;Intermittent upper extremity support;Time x4 min in semitandem    Rockerboard  Anterior/posterior;EO;Other time (comment);Intermittent UE support x1 min    Heel Raises Limitations  x20 reps          PT Short Term Goals - 11/28/17 1350      PT SHORT TERM GOAL #1   Title  Independent with an initial HEP.    Time  3    Period  Weeks    Status  New  PT SHORT TERM GOAL #2   Title  Improve Berg score to 36/56.    Time  3    Period  Weeks    Status  New        PT Long Term Goals - 11/28/17 1351      PT LONG TERM GOAL #1   Title  Independent with an advanced HEP.    Time  6    Period  Weeks    Status  New      PT LONG TERM GOAL #2   Title  Improve Berg score to 42/56.    Time  6    Period  Weeks    Status  New      PT LONG TERM GOAL #3   Title  Increase bilateral hip strength to 5/5 to increase stability for functional tasks.    Time  6    Period  Weeks    Status  New            Plan - 12/14/17 1425    Clinical Impression Statement  Patient tolerated today's treatment well with no complaints upon arrival. Patient did report calf tightness into R medial adductors during treatment. Patient observed with increased B knee flexion the longer he stood and would intermittantly extend his knees somewhat without instruction from PTA. Fairly good stability noted with balance activities today. Patient reported increased low back discomfort with seated marching.     Rehab Potential  Good     PT Frequency  2x / week    PT Duration  6 weeks    PT Treatment/Interventions  ADLs/Self Care Home Management;Gait training;Stair training;Functional mobility training;Therapeutic activities;Therapeutic exercise;Balance training;Neuromuscular re-education;Patient/family education    PT Next Visit Plan  Balance/gait actiivties. Bilateral hip strengthening.    Consulted and Agree with Plan of Care  Patient       Patient will benefit from skilled therapeutic intervention in order to improve the following deficits and impairments:  Abnormal gait, Decreased activity tolerance, Decreased balance, Difficulty walking, Decreased strength, Postural dysfunction  Visit Diagnosis: Unsteadiness on feet  Muscle weakness (generalized)     Problem List Patient Active Problem List   Diagnosis Date Noted  . 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 OBSTRUCTIVE SLEEP APNEA 08/20/2007  . CHRONIC PAIN SYNDROME 07/30/2007  . HYPERTENSION 07/30/2007  . 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

## 2017-12-19 ENCOUNTER — Encounter: Payer: Self-pay | Admitting: Physical Therapy

## 2017-12-19 ENCOUNTER — Ambulatory Visit: Payer: Medicare Other | Admitting: Physical Therapy

## 2017-12-19 DIAGNOSIS — R2681 Unsteadiness on feet: Secondary | ICD-10-CM

## 2017-12-19 DIAGNOSIS — M6281 Muscle weakness (generalized): Secondary | ICD-10-CM

## 2017-12-19 NOTE — Therapy (Signed)
[facility] [address] [place], [place], [address] Phone: [phone]   Fax:  [phone]  Physical Therapy Treatment  Patient Details  Name: [name] MRN: [id number] Date of Birth: September 16, 1945 Referring Provider: Cannon Kettle MD.   Encounter Date: 12/19/2017  PT End of Session - 12/19/17 1325    Visit Number  7    Number of Visits  12    Date for PT Re-Evaluation  01/09/18    Authorization Type  10TH VISIT PROGRESS NOTE AND KX MODIFIER AFTER THE 15 VISIT.    PT Start Time  1301    PT Stop Time  1343    PT Time Calculation (min)  42 min    Activity Tolerance  Patient tolerated treatment well    Behavior During Therapy  WFL for tasks assessed/performed       Past Medical History:  Diagnosis Date  . Arthritis   . Hypertension     Past Surgical History:  Procedure Laterality Date  . ANKLE SURGERY    . BACK SURGERY    . JOINT REPLACEMENT    . NECK SURGERY    . TOTAL KNEE ARTHROPLASTY      There were no vitals filed for this visit.  Subjective Assessment - 12/19/17 1315    Subjective  Reports feeling "terrible" upon arrival.    Pertinent History  Spinal arthritis; spine surgery; bilateral TKA's; ankle surgery.    Limitations  Standing    How long can you stand comfortably?  10+ minutes.    How long can you walk comfortably?  Short community distances.    Currently in Pain?  Yes    Pain Score  6     Pain Location  Back back, neck, arms, legs    Pain Descriptors / Indicators  Other (Comment);Aching Stiff    Pain Type  Chronic pain         [facility] PT Assessment - 12/19/17 0001      Assessment   Medical Diagnosis  Unsteady gait.      Precautions   Precautions  Fall      Restrictions   Weight Bearing Restrictions  No                   [facility] Adult PT Treatment/Exercise - 12/19/17 0001      Knee/Hip Exercises: Aerobic   Nustep  Level 5 x15 minutes      Knee/Hip Exercises: Seated    Long Arc Quad  Strengthening;Both;3 sets;10 reps;Weights    Long Arc Quad Weight  4 lbs.    Clamshell with TheraBand  Green 3x10 reps    Hamstring Curl  Strengthening;Both;3 sets;10 reps;Limitations    Hamstring Limitations  Red theraband    Sit to Sand  without UE support;5 reps green theraband          Balance Exercises - 12/19/17 1423      Balance Exercises: Standing   Standing Eyes Opened  Narrow base of support (BOS);Foam/compliant surface;Time x3 min    Tandem Stance  Eyes open;Intermittent upper extremity support;Foam/compliant surface;2 reps;Time x1 min each    Step Ups  Forward;6 inch;UE support 2 x10 reps each          PT Short Term Goals - 11/28/17 1350      PT SHORT TERM GOAL #1   Title  Independent with an initial HEP.    Time  3    Period  Weeks    Status  New  PT SHORT TERM GOAL #2   Title  Improve Berg score to 36/56.    Time  3    Period  Weeks    Status  New        PT Long Term Goals - 11/28/17 1351      PT LONG TERM GOAL #1   Title  Independent with an advanced HEP.    Time  6    Period  Weeks    Status  New      PT LONG TERM GOAL #2   Title  Improve Berg score to 42/56.    Time  6    Period  Weeks    Status  New      PT LONG TERM GOAL #3   Title  Increase bilateral hip strength to 5/5 to increase stability for functional tasks.    Time  6    Period  Weeks    Status  New            Plan - 12/19/17 1425    Clinical Impression Statement  Patient tolerated today's treatment fair as he arrived with LBP as well as neck and LE pain. Patient required increased time for seated therex session secondary to discomfort. Minimal increased progression of resistance with HS curl and clam without complaint. Limited with standing for balance activities today as well with uneven surface.    Rehab Potential  Good    PT Frequency  2x / week    PT Duration  6 weeks    PT Treatment/Interventions  ADLs/Self Care Home Management;Gait  training;Stair training;Functional mobility training;Therapeutic activities;Therapeutic exercise;Balance training;Neuromuscular re-education;Patient/family education    PT Next Visit Plan  Balance/gait actiivties. Bilateral hip strengthening.    Consulted and Agree with Plan of Care  Patient       Patient will benefit from skilled therapeutic intervention in order to improve the following deficits and impairments:  Abnormal gait, Decreased activity tolerance, Decreased balance, Difficulty walking, Decreased strength, Postural dysfunction  Visit Diagnosis: Muscle weakness (generalized)  Unsteadiness on feet     Problem List Patient Active Problem List   Diagnosis Date Noted  . 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 OBSTRUCTIVE SLEEP APNEA 08/20/2007  . CHRONIC PAIN SYNDROME 07/30/2007  . HYPERTENSION 07/30/2007  . 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

## 2017-12-21 ENCOUNTER — Ambulatory Visit: Payer: Medicare Other | Admitting: Physical Therapy

## 2017-12-21 NOTE — Therapy (Signed)
[facility] [place], [place], [address] Phone: [phone]   Fax:  [phone]  Physical Therapy Treatment  Patient Details  Name: [name] MRN: [id number] Date of Birth: 1946-05-08 Referring Provider: Kandace Blitz MD.   Encounter Date: 12/21/2017    Past Medical History:  Diagnosis Date  . Arthritis   . Hypertension     Past Surgical History:  Procedure Laterality Date  . ANKLE SURGERY    . BACK SURGERY    . JOINT REPLACEMENT    . NECK SURGERY    . TOTAL KNEE ARTHROPLASTY      There were no vitals filed for this visit.                              PT Short Term Goals - 11/28/17 1350      PT SHORT TERM GOAL #1   Title  Independent with an initial HEP.    Time  3    Period  Weeks    Status  New      PT SHORT TERM GOAL #2   Title  Improve Berg score to 36/56.    Time  3    Period  Weeks    Status  New        PT Long Term Goals - 11/28/17 1351      PT LONG TERM GOAL #1   Title  Independent with an advanced HEP.    Time  6    Period  Weeks    Status  New      PT LONG TERM GOAL #2   Title  Improve Berg score to 42/56.    Time  6    Period  Weeks    Status  New      PT LONG TERM GOAL #3   Title  Increase bilateral hip strength to 5/5 to increase stability for functional tasks.    Time  6    Period  Weeks    Status  New              Patient will benefit from skilled therapeutic intervention in order to improve the following deficits and impairments:     Visit Diagnosis: Muscle weakness (generalized)  Unsteadiness on feet     Problem List Patient Active Problem List   Diagnosis Date Noted  . 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 OBSTRUCTIVE SLEEP APNEA 08/20/2007  . CHRONIC PAIN SYNDROME 07/30/2007  . HYPERTENSION 07/30/2007  . 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   Remaining deficits: Patient requesting discharge and he states PT is "making me worse" with increased pain.   Education / Equipment: HEP. Plan: Patient agrees to discharge.  Patient goals were not met. Patient is being discharged due to the patient's request.  ?????      Mazzy Santarelli, [place]  MPT 12/21/2017, 1:34 PM  [facility] [place], [place], [address] Phone: [phone]   Fax:  [phone]  Name: [name] MRN: [id number] Date of Birth: [birth year]

## 2018-02-14 ENCOUNTER — Other Ambulatory Visit: Payer: Self-pay

## 2018-02-14 ENCOUNTER — Emergency Department (HOSPITAL_COMMUNITY): Payer: Medicare Other

## 2018-02-14 ENCOUNTER — Encounter (HOSPITAL_COMMUNITY): Payer: Self-pay | Admitting: Emergency Medicine

## 2018-02-14 ENCOUNTER — Emergency Department (HOSPITAL_COMMUNITY)
Admission: EM | Admit: 2018-02-14 | Discharge: 2018-02-15 | Disposition: A | Discharge status: Home / Self Care | Payer: Medicare Other | Attending: Emergency Medicine | Admitting: Emergency Medicine

## 2018-02-14 DIAGNOSIS — I10 Essential (primary) hypertension: Secondary | ICD-10-CM | POA: Diagnosis not present

## 2018-02-14 DIAGNOSIS — W01198A Fall on same level from slipping, tripping and stumbling with subsequent striking against other object, initial encounter: Secondary | ICD-10-CM | POA: Insufficient documentation

## 2018-02-14 DIAGNOSIS — Y999 Unspecified external cause status: Secondary | ICD-10-CM | POA: Insufficient documentation

## 2018-02-14 DIAGNOSIS — Z79899 Other long term (current) drug therapy: Secondary | ICD-10-CM | POA: Insufficient documentation

## 2018-02-14 DIAGNOSIS — Z96659 Presence of unspecified artificial knee joint: Secondary | ICD-10-CM | POA: Diagnosis not present

## 2018-02-14 DIAGNOSIS — Y92009 Unspecified place in unspecified non-institutional (private) residence as the place of occurrence of the external cause: Secondary | ICD-10-CM | POA: Insufficient documentation

## 2018-02-14 DIAGNOSIS — S199XXA Unspecified injury of neck, initial encounter: Secondary | ICD-10-CM | POA: Diagnosis present

## 2018-02-14 DIAGNOSIS — Y9301 Activity, walking, marching and hiking: Secondary | ICD-10-CM | POA: Insufficient documentation

## 2018-02-14 DIAGNOSIS — S12500A Unspecified displaced fracture of sixth cervical vertebra, initial encounter for closed fracture: Secondary | ICD-10-CM | POA: Diagnosis not present

## 2018-02-14 DIAGNOSIS — W19XXXA Unspecified fall, initial encounter: Secondary | ICD-10-CM

## 2018-02-14 DIAGNOSIS — R51 Headache: Secondary | ICD-10-CM | POA: Insufficient documentation

## 2018-02-14 MED ORDER — OXYCODONE-ACETAMINOPHEN 5-325 MG PO TABS
1.0000 | ORAL_TABLET | Freq: Once | ORAL | Status: AC
  Administered 2018-02-14: 1 via ORAL
  Filled 2018-02-14: qty 1

## 2018-02-14 NOTE — ED Triage Notes (Signed)
Pt fell getting out of bed and his head hit the wall and c/o neck pain. Pt denies any loc.

## 2018-02-15 NOTE — ED Provider Notes (Signed)
[facility]ERGENCY DEPARTMENT Provider Note   CSN: [id number] Arrival date & time: 02/14/18  2130     History   Chief Complaint Chief Complaint  Patient presents with  . Fall    HPI [name] is a 72 y.o. male.  HPI   [name] is a 72 y.o. male who presents to the Emergency Department via EMS complaining of neck pain secondary to a fall that occurred around 8:30 PM this evening.  He states that he was walking across the room with his walker and fell into the corner of the room striking his head on the wall.  He complains of neck pain.  He denies loss of consciousness, headache, visual changes, numbness or weakness of his extremities or vomiting.  He also complains of right rib pain for 1 week that is secondary to a previous fall.  No shortness of breath and the rib pain was not exacerbated by this evening's fall. He has not been evaluated previously for this.  Patient states he was unable to get up without assistance.  Patient son helped the patient to the bed and his spouse called EMS.  Cervical collar was applied prior to arrival.  He does admit to a previous cervical fusion in the early 2000's.  Patient states he does not take any aspirin or anticoagulants   Past Medical History:  Diagnosis Date  . Arthritis   . Hypertension     Patient Active Problem List   Diagnosis Date Noted  . 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 OBSTRUCTIVE SLEEP APNEA 08/20/2007  . CHRONIC PAIN SYNDROME 07/30/2007  . HYPERTENSION 07/30/2007  . 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 ANKLE SURGERY    . BACK SURGERY    . JOINT REPLACEMENT    . NECK SURGERY    . TOTAL KNEE ARTHROPLASTY          Home Medications    Prior to Admission medications   Medication Sig Start Date End Date Taking? Authorizing Provider  amLODipine (NORVASC) 10 MG tablet Take 10 mg by mouth daily.     Yes [provider]  furosemide  (LASIX) 40 MG tablet Take 40 mg by mouth every morning. 08/15/17  Yes [provider]  losartan (COZAAR) 100 MG tablet Take 100 mg by mouth every evening. 08/27/15  Yes [provider]  morphine (MS CONTIN) 30 MG 12 hr tablet Take 30 mg by mouth 3 (three) times daily.   Yes [provider]  pregabalin (LYRICA) 150 MG capsule Take 150 mg by mouth 3 (three) times daily.     Yes [provider]  sertraline (ZOLOFT) 100 MG tablet Take 100 mg by mouth daily. 01/08/18  Yes [provider]    Family History Family History  Problem Relation Age of Onset  . Heart disease Mother     Social History Social History   Tobacco Use  . Smoking status: Never Smoker  . Smokeless tobacco: Never Used  Substance Use Topics  . Alcohol use: Yes  . Drug use: No     Allergies   Codeine   Review of Systems Review of Systems  Constitutional: Negative for chills and fever.  Eyes: Negative for visual disturbance.  Respiratory: Negative for cough, chest tightness and shortness of breath.   Cardiovascular: Positive for chest pain (Right anterior rib pain).  Gastrointestinal: Negative for abdominal pain, nausea and vomiting.  Genitourinary: Negative for difficulty urinating and dysuria.  Musculoskeletal: Positive for neck pain. Negative for arthralgias, back pain and joint swelling.  Skin: Negative for color change and wound.  Neurological: Negative for dizziness, syncope, weakness, light-headedness, numbness and headaches.  Psychiatric/Behavioral: Negative for confusion.     Physical Exam Updated Vital Signs BP 132/63 (BP Location: Right Arm)   Pulse 60   Temp 99 F (37.2 C) (Oral)   Resp 17   Wt 114 kg   SpO2 99%   BMI 34.09 kg/m   Physical Exam  Constitutional: He is oriented to person, place, and time. No distress.  HENT:  Mouth/Throat: Oropharynx is clear and moist.  No tenderness or hematomas of the scalp  Eyes: Pupils are equal, round, and  reactive to light. EOM are normal.  Neck:  Hard cervical collar applied prior to my exam.  Tenderness to palpation of the lower cervical spine.  Cardiovascular: Normal rate, regular rhythm and intact distal pulses.  No murmur heard. Pulmonary/Chest: Effort normal and breath sounds normal. No respiratory distress. He exhibits tenderness (Focal tenderness to palpation of the right lower anterior ribs no edema or crepitus.).  Abdominal: Soft. He exhibits no distension and no mass. There is no tenderness. There is no guarding.  Musculoskeletal: He exhibits no edema or tenderness.  Patient has difficulty with full extension of the knees at baseline.  No tenderness of the lumbar spine or bilateral hips.  Old appearing abrasion of the anterior right knee. Grip strength is strong and symmetrical bilaterally.    Neurological: He is alert and oriented to person, place, and time. No sensory deficit.  CN II-XII grossly intact.  No pronator drift.  Speech clear.    Skin: Skin is warm. Capillary refill takes less than 2 seconds.  Psychiatric: He has a normal mood and affect.  Nursing note and vitals reviewed.    ED Treatments / Results  Labs (all labs ordered are listed, but only abnormal results are displayed) Labs Reviewed - No data to display  EKG None  Radiology Dg Ribs Unilateral W/chest Right  Result Date: 02/14/2018 CLINICAL DATA:  Fall with rib pain EXAM: RIGHT RIBS AND CHEST - 3+ VIEW COMPARISON:  02/28/2012, CT chest 01/16/2013 FINDINGS: Single-view chest demonstrates cardiomegaly. No pleural effusion. No pneumothorax. Ascending thoracic stimulator leads. Right rib series demonstrates multiple old right lower rib fractures. There are acute appearing mildly displaced right sixth, seventh, eighth, and ninth rib fractures. IMPRESSION: 1. Negative for pneumothorax or pleural effusion.  Cardiomegaly. 2. Acute mildly displaced right sixth through ninth rib fractures. Electronically Signed   By: Jasmine Pang M.D.   On: 02/14/2018 23:27   Ct Head Wo Contrast  Result Date: 02/14/2018 CLINICAL DATA:  Fall getting out of bed with headaches and neck pain, initial encounter EXAM: CT HEAD WITHOUT CONTRAST CT CERVICAL SPINE WITHOUT CONTRAST TECHNIQUE: Multidetector CT imaging of the head and cervical spine was performed following the standard protocol without intravenous contrast. Multiplanar CT image reconstructions of the cervical spine were also generated. COMPARISON:  09/05/2017 FINDINGS: CT HEAD FINDINGS Brain: Atrophic changes and chronic white matter ischemic change is seen. No findings to suggest acute hemorrhage, acute infarction or space-occupying mass lesion are noted. Vascular: No hyperdense vessel or unexpected calcification. Skull: Skull is intact. Posterior fusion hardware is noted in the occiput put stable from the prior exam. Sinuses/Orbits: No acute finding. Other: None. CT CERVICAL SPINE FINDINGS Alignment: Within normal limits. Skull base and vertebrae: 7 cervical segments are well visualized. There are changes consistent with prior fusion  extending from the occiput to C4 stable in appearance from the prior exam. Large flowing anterior osteophytes are identified. There is evidence of fracture through the anterior osteophytes along the inferior aspect of the C5 vertebral body with extension inferiorly into the superior endplate of C6. Fracture is also noted through the base of the spinous process at C6 and to a lesser degree within the lamina at C5 on the left. Soft tissues and spinal canal: No soft tissue abnormality is noted. Upper chest: Upper chest is within normal limits. Other: None IMPRESSION: CT of the head: Chronic atrophic and ischemic changes without acute abnormality. CT of the cervical spine: Mildly displaced fractures through the anterior osteophytes at the C5/C6 level with extension of the fracture line into the anterior aspect of the superior endplate of C6 on the left. There are  suture she aided fractures through the spinous process of C6 as well as through the lamina on the left at C5. No soft tissue abnormality is noted. Fusion hardware from the occiput to C4 which appears intact. Critical Value/emergent results were called by telephone at the time of interpretation on 02/14/2018 at 11:31 pm to [facility] Gibbon[facility] , who verbally acknowledged these results. Electronically Signed   By: Alcide CleverMark  Lukens M.D.   On: 02/14/2018 23:31   Ct Cervical Spine Wo Contrast  Result Date: 02/14/2018 CLINICAL DATA:  Fall getting out of bed with headaches and neck pain, initial encounter EXAM: CT HEAD WITHOUT CONTRAST CT CERVICAL SPINE WITHOUT CONTRAST TECHNIQUE: Multidetector CT imaging of the head and cervical spine was performed following the standard protocol without intravenous contrast. Multiplanar CT image reconstructions of the cervical spine were also generated. COMPARISON:  09/05/2017 FINDINGS: CT HEAD FINDINGS Brain: Atrophic changes and chronic white matter ischemic change is seen. No findings to suggest acute hemorrhage, acute infarction or space-occupying mass lesion are noted. Vascular: No hyperdense vessel or unexpected calcification. Skull: Skull is intact. Posterior fusion hardware is noted in the occiput put stable from the prior exam. Sinuses/Orbits: No acute finding. Other: None. CT CERVICAL SPINE FINDINGS Alignment: Within normal limits. Skull base and vertebrae: 7 cervical segments are well visualized. There are changes consistent with prior fusion extending from the occiput to C4 stable in appearance from the prior exam. Large flowing anterior osteophytes are identified. There is evidence of fracture through the anterior osteophytes along the inferior aspect of the C5 vertebral body with extension inferiorly into the superior endplate of C6. Fracture is also noted through the base of the spinous process at C6 and to a lesser degree within the lamina at C5 on the left. Soft tissues and  spinal canal: No soft tissue abnormality is noted. Upper chest: Upper chest is within normal limits. Other: None IMPRESSION: CT of the head: Chronic atrophic and ischemic changes without acute abnormality. CT of the cervical spine: Mildly displaced fractures through the anterior osteophytes at the C5/C6 level with extension of the fracture line into the anterior aspect of the superior endplate of C6 on the left. There are suture she aided fractures through the spinous process of C6 as well as through the lamina on the left at C5. No soft tissue abnormality is noted. Fusion hardware from the occiput to C4 which appears intact. Critical Value/emergent results were called by telephone at the time of interpretation on 02/14/2018 at 11:31 pm to [facility] Hyams, PA , who verbally acknowledged these results. Electronically Signed   By: Alcide CleverMark  Lukens M.D.   On: 02/14/2018 23:31  Procedures Procedures (including critical care time)  Medications Ordered in ED Medications  oxyCODONE-acetaminophen (PERCOCET/ROXICET) 5-325 MG per tablet 1 tablet (1 tablet Oral Given 02/14/18 2248)     Initial Impression / Assessment and Plan / ED Course  I have reviewed the triage vital signs and the nursing notes.  Pertinent labs & imaging results that were available during my care of the patient were reviewed by me and considered in my medical decision making (see chart for details).    Patient with neck pain secondary to mechanical fall that occurred earlier this evening.  Walks with walker at baseline.  Neurovascularly intact.  Does not take anticoagulants. Pt notified of rib fractures that occurred from a previous fall.    0100  Consulted Dr. Franky Macho with neurosurgery and discussed findings.  He recommends to have patient remain in [place] collar and call the office for follow-up appointment in 1 week.  Patient agrees to treatment plan, he has pain medication at home.  Return precautions discussed.    Final Clinical  Impressions(s) / ED Diagnoses   Final diagnoses:  Closed displaced fracture of sixth cervical vertebra, unspecified fracture morphology, initial encounter [facility])  Fall, initial encounter    ED Discharge Orders    None       Pauline Aus, PA-C 02/15/18 0154    Donnetta Hutching, MD 02/19/18 [phone]

## 2018-02-15 NOTE — Discharge Instructions (Addendum)
As discussed, do not remove the neck collar.  Call Dr. Sueanne Margaritaabbell's office tomorrow to arrange a follow-up appointment for 1 week.  Return to the ER for any worsening symptoms such as sudden numbness or weakness of your arms or hands, headaches, increasing pain or vomiting.  Continue taking your pain medication as directed

## 2018-02-23 ENCOUNTER — Other Ambulatory Visit: Payer: Self-pay | Admitting: Neurological Surgery

## 2018-02-23 ENCOUNTER — Other Ambulatory Visit (HOSPITAL_COMMUNITY): Payer: Self-pay | Admitting: Neurological Surgery

## 2018-02-23 DIAGNOSIS — R29898 Other symptoms and signs involving the musculoskeletal system: Secondary | ICD-10-CM

## 2018-02-26 MED ORDER — SODIUM CHLORIDE 0.9 % IV SOLN: 4.0000 mg | Freq: Four times a day (QID) | INTRAVENOUS | Status: AC | PRN

## 2018-03-14 ENCOUNTER — Ambulatory Visit (HOSPITAL_COMMUNITY)
Admission: RE | Admit: 2018-03-14 | Discharge: 2018-03-14 | Disposition: A | Discharge status: Home / Self Care | Payer: Medicare Other | Source: Ambulatory Visit | Attending: Neurological Surgery | Admitting: Neurological Surgery

## 2018-03-14 ENCOUNTER — Other Ambulatory Visit (HOSPITAL_COMMUNITY): Payer: Self-pay | Admitting: Neurological Surgery

## 2018-03-14 DIAGNOSIS — Z981 Arthrodesis status: Secondary | ICD-10-CM | POA: Insufficient documentation

## 2018-03-14 DIAGNOSIS — R29898 Other symptoms and signs involving the musculoskeletal system: Secondary | ICD-10-CM | POA: Diagnosis not present

## 2018-03-14 DIAGNOSIS — Z9889 Other specified postprocedural states: Secondary | ICD-10-CM | POA: Insufficient documentation

## 2018-03-14 DIAGNOSIS — M5124 Other intervertebral disc displacement, thoracic region: Secondary | ICD-10-CM | POA: Diagnosis not present

## 2018-03-14 DIAGNOSIS — M4804 Spinal stenosis, thoracic region: Secondary | ICD-10-CM | POA: Diagnosis not present

## 2018-03-14 DIAGNOSIS — G8222 Paraplegia, incomplete: Secondary | ICD-10-CM | POA: Diagnosis not present

## 2018-03-14 MED ORDER — ONDANSETRON HCL 4 MG/2ML IJ SOLN: 4.0000 mg | Freq: Four times a day (QID) | INTRAMUSCULAR | Status: DC | PRN

## 2018-03-14 MED ORDER — IOPAMIDOL (ISOVUE-M 200) INJECTION 41%
20.0000 mL | Freq: Once | INTRAMUSCULAR | Status: AC
  Administered 2018-03-14: 14 mL via INTRATHECAL

## 2018-03-14 MED ORDER — DIAZEPAM 5 MG PO TABS
ORAL_TABLET | ORAL | Status: AC
  Filled 2018-03-14: qty 2

## 2018-03-14 MED ORDER — HYDROCODONE-ACETAMINOPHEN 5-325 MG PO TABS
1.0000 | ORAL_TABLET | ORAL | Status: DC | PRN
  Administered 2018-03-14: 2 via ORAL

## 2018-03-14 MED ORDER — LIDOCAINE HCL (PF) 1 % IJ SOLN
5.0000 mL | Freq: Once | INTRAMUSCULAR | Status: AC
  Administered 2018-03-14: 5 mL via INTRADERMAL

## 2018-03-14 MED ORDER — DIAZEPAM 5 MG PO TABS
10.0000 mg | ORAL_TABLET | Freq: Once | ORAL | Status: AC
  Administered 2018-03-14: 10 mg via ORAL

## 2018-03-14 MED ORDER — OXYCODONE-ACETAMINOPHEN 5-325 MG PO TABS
ORAL_TABLET | ORAL | Status: AC
  Administered 2018-03-14: 2
  Filled 2018-03-14: qty 2

## 2018-03-14 NOTE — Progress Notes (Signed)
Patient given percocet @ [phone] instead of norco. Dr. Danielle Dess called and made aware. No new orders given.

## 2018-03-14 NOTE — Discharge Instructions (Signed)
Myelogram, Care After °These instructions give you information about caring for yourself after your procedure. Your doctor may also give you more specific instructions. Call your doctor if you have any problems or questions after your procedure. °Follow these instructions at home: °· Drink enough fluid to keep your pee (urine) clear or pale yellow. °· Rest as told by your doctor. °· Lie flat with your head slightly raised (elevated). °· Do not bend, lift, or do any hard activities for 24-48 hours or as told by your doctor. °· Take over-the-counter and prescription medicines only as told by your doctor. °· Take care of and remove your bandage (dressing) as told by your doctor. °· Bathe or shower as told by your doctor. °Contact a health care provider if: °· You have a fever. °· You have a headache that lasts longer than 24 hours. °· You feel sick to your stomach (nauseous). °· You throw up (vomit). °· Your neck is stiff. °· Your legs feel numb. °· You cannot pee. °· You cannot poop (have a bowel movement). °· You have a rash. °· You are itchy or sneezing. °Get help right away if: °· You have new symptoms or your symptoms get worse. °· You have a seizure. °· You have trouble breathing. °This information is not intended to replace advice given to you by your health care provider. Make sure you discuss any questions you have with your health care provider. °Document Released: 03/29/2008 Document Revised: 02/18/2016 Document Reviewed: 04/02/2015 °Elsevier Interactive Patient Education © 2018 [employer] ° °

## 2018-03-14 NOTE — Procedures (Signed)
Mr. [name] is a 72 year old individual who has had progressive difficulty with walking and a number of significant falls.  Most recent of which resulted in the C6 fracture.  He had had a spinal cord stimulator placed a number of years back for significant chronic back pain and he does not recall nor can I find any record of him having imaging studies of the lumbar spine or the thoracolumbar spine to rule out any significant stenosis he notes that his legs feel weak and numb and his exam demonstrates moderate diffuse weakness in the lower extremities.  Because of the presence of the spinal cord stimulator myelogram was suggested to rule out the presence of any significant stenotic pathology that could be aggravating or causing the weakness in his legs.  Pre op Dx: Paraplegia Post op Dx: Same Procedure: Lumbar myelogram Surgeon: Kallin Henk Puncture level: L3-L4 Fluid color: Clear colorless Injection: Isovue 200, 14 mL Findings: No significant stenosis in the lumbar spine however abnormal stenosis at the upper level of the electrodes in the mid thoracic spine was noted incidentally on mobilizing the dye to that area.  We will explore this area further with the CT scan through the thoracic spine also.

## 2018-03-14 NOTE — Progress Notes (Addendum)
Pt was settled into room when CT called stating pt did not have post ct scan. Pt has now left with rad tech via bed for scan.  1000 pt returned.

## 2018-05-04 DIAGNOSIS — Z789 Other specified health status: Secondary | ICD-10-CM

## 2018-05-04 DIAGNOSIS — F1090 Alcohol use, unspecified, uncomplicated: Secondary | ICD-10-CM

## 2018-05-04 DIAGNOSIS — Z7289 Other problems related to lifestyle: Secondary | ICD-10-CM

## 2018-05-04 HISTORY — DX: Other problems related to lifestyle: Z72.89

## 2018-05-04 HISTORY — DX: Other specified health status: Z78.9

## 2018-05-04 HISTORY — DX: Alcohol use, unspecified, uncomplicated: F10.90

## 2018-05-08 ENCOUNTER — Inpatient Hospital Stay (HOSPITAL_COMMUNITY)
Admission: EM | Admit: 2018-05-08 | Discharge: 2018-05-15 | DRG: 896 | Disposition: A | Discharge status: Skilled Nursing Facility | Payer: Medicare Other | Attending: Internal Medicine | Admitting: Internal Medicine

## 2018-05-08 ENCOUNTER — Emergency Department (HOSPITAL_COMMUNITY): Payer: Medicare Other

## 2018-05-08 ENCOUNTER — Encounter (HOSPITAL_COMMUNITY): Payer: Self-pay | Admitting: Emergency Medicine

## 2018-05-08 ENCOUNTER — Other Ambulatory Visit: Payer: Self-pay

## 2018-05-08 DIAGNOSIS — Z981 Arthrodesis status: Secondary | ICD-10-CM

## 2018-05-08 DIAGNOSIS — I129 Hypertensive chronic kidney disease with stage 1 through stage 4 chronic kidney disease, or unspecified chronic kidney disease: Secondary | ICD-10-CM | POA: Diagnosis present

## 2018-05-08 DIAGNOSIS — F10239 Alcohol dependence with withdrawal, unspecified: Principal | ICD-10-CM | POA: Diagnosis present

## 2018-05-08 DIAGNOSIS — Z79899 Other long term (current) drug therapy: Secondary | ICD-10-CM

## 2018-05-08 DIAGNOSIS — R4182 Altered mental status, unspecified: Secondary | ICD-10-CM

## 2018-05-08 DIAGNOSIS — W2209XA Striking against other stationary object, initial encounter: Secondary | ICD-10-CM | POA: Diagnosis present

## 2018-05-08 DIAGNOSIS — R296 Repeated falls: Secondary | ICD-10-CM

## 2018-05-08 DIAGNOSIS — D649 Anemia, unspecified: Secondary | ICD-10-CM | POA: Diagnosis present

## 2018-05-08 DIAGNOSIS — M459 Ankylosing spondylitis of unspecified sites in spine: Secondary | ICD-10-CM | POA: Diagnosis present

## 2018-05-08 DIAGNOSIS — W19XXXA Unspecified fall, initial encounter: Secondary | ICD-10-CM | POA: Diagnosis present

## 2018-05-08 DIAGNOSIS — I10 Essential (primary) hypertension: Secondary | ICD-10-CM | POA: Diagnosis present

## 2018-05-08 DIAGNOSIS — S12500A Unspecified displaced fracture of sixth cervical vertebra, initial encounter for closed fracture: Secondary | ICD-10-CM | POA: Diagnosis present

## 2018-05-08 DIAGNOSIS — G629 Polyneuropathy, unspecified: Secondary | ICD-10-CM | POA: Diagnosis present

## 2018-05-08 DIAGNOSIS — G9340 Encephalopathy, unspecified: Secondary | ICD-10-CM | POA: Diagnosis present

## 2018-05-08 DIAGNOSIS — G894 Chronic pain syndrome: Secondary | ICD-10-CM | POA: Diagnosis present

## 2018-05-08 DIAGNOSIS — G9341 Metabolic encephalopathy: Secondary | ICD-10-CM | POA: Diagnosis present

## 2018-05-08 DIAGNOSIS — I440 Atrioventricular block, first degree: Secondary | ICD-10-CM | POA: Diagnosis present

## 2018-05-08 DIAGNOSIS — N183 Chronic kidney disease, stage 3 (moderate): Secondary | ICD-10-CM | POA: Diagnosis present

## 2018-05-08 DIAGNOSIS — M47812 Spondylosis without myelopathy or radiculopathy, cervical region: Secondary | ICD-10-CM | POA: Diagnosis present

## 2018-05-08 DIAGNOSIS — R5381 Other malaise: Secondary | ICD-10-CM | POA: Diagnosis present

## 2018-05-08 DIAGNOSIS — Z96659 Presence of unspecified artificial knee joint: Secondary | ICD-10-CM | POA: Diagnosis present

## 2018-05-08 DIAGNOSIS — Z77090 Contact with and (suspected) exposure to asbestos: Secondary | ICD-10-CM | POA: Diagnosis present

## 2018-05-08 DIAGNOSIS — F0390 Unspecified dementia without behavioral disturbance: Secondary | ICD-10-CM | POA: Diagnosis present

## 2018-05-08 DIAGNOSIS — G4733 Obstructive sleep apnea (adult) (pediatric): Secondary | ICD-10-CM | POA: Diagnosis present

## 2018-05-08 DIAGNOSIS — E871 Hypo-osmolality and hyponatremia: Secondary | ICD-10-CM | POA: Diagnosis present

## 2018-05-08 DIAGNOSIS — R441 Visual hallucinations: Secondary | ICD-10-CM | POA: Diagnosis present

## 2018-05-08 DIAGNOSIS — Z885 Allergy status to narcotic agent status: Secondary | ICD-10-CM

## 2018-05-08 LAB — CBG MONITORING, ED: GLUCOSE-CAPILLARY: 104 mg/dL — AB (ref 70–99)

## 2018-05-08 LAB — COMPREHENSIVE METABOLIC PANEL
ALT: 19 U/L (ref 0–44)
AST: 25 U/L (ref 15–41)
Albumin: 3.8 g/dL (ref 3.5–5.0)
Alkaline Phosphatase: 92 U/L (ref 38–126)
Anion gap: 10 (ref 5–15)
BUN: 25 mg/dL — AB (ref 8–23)
CHLORIDE: 99 mmol/L (ref 98–111)
CO2: 24 mmol/L (ref 22–32)
CREATININE: 1.56 mg/dL — AB (ref 0.61–1.24)
Calcium: 8.7 mg/dL — ABNORMAL LOW (ref 8.9–10.3)
GFR calc Af Amer: 50 mL/min — ABNORMAL LOW (ref >60)
GFR calc non Af Amer: 43 mL/min — ABNORMAL LOW (ref >60)
GLUCOSE: 115 mg/dL — AB (ref 70–99)
Potassium: 4.3 mmol/L (ref 3.5–5.1)
SODIUM: 133 mmol/L — AB (ref 135–145)
Total Bilirubin: 1.3 mg/dL — ABNORMAL HIGH (ref 0.3–1.2)
Total Protein: 7.7 g/dL (ref 6.5–8.1)

## 2018-05-08 LAB — CBC WITH DIFFERENTIAL/PLATELET
ABS IMMATURE GRANULOCYTES: 0.03 10*3/uL (ref 0.00–0.07)
BASOS PCT: 1 %
Basophils Absolute: 0.1 10*3/uL (ref 0.0–0.1)
Eosinophils Absolute: 0.1 10*3/uL (ref 0.0–0.5)
Eosinophils Relative: 1 %
HCT: 36 % — ABNORMAL LOW (ref 39.0–52.0)
Hemoglobin: 11.9 g/dL — ABNORMAL LOW (ref 13.0–17.0)
Immature Granulocytes: 0 %
LYMPHS PCT: 10 %
Lymphs Abs: 1 10*3/uL (ref 0.7–4.0)
MCH: 30.4 pg (ref 26.0–34.0)
MCHC: 33.1 g/dL (ref 30.0–36.0)
MCV: 92.1 fL (ref 80.0–100.0)
MONOS PCT: 8 %
Monocytes Absolute: 0.8 10*3/uL (ref 0.1–1.0)
NEUTROS ABS: 7.5 10*3/uL (ref 1.7–7.7)
Neutrophils Relative %: 80 %
PLATELETS: 170 10*3/uL (ref 150–400)
RBC: 3.91 MIL/uL — ABNORMAL LOW (ref 4.22–5.81)
RDW: 14.8 % (ref 11.5–15.5)
WBC: 9.4 10*3/uL (ref 4.0–10.5)
nRBC: 0 % (ref 0.0–0.2)

## 2018-05-08 LAB — TSH: TSH: 2.25 u[IU]/mL (ref 0.350–4.500)

## 2018-05-08 LAB — CK: Total CK: 154 U/L (ref 49–397)

## 2018-05-08 LAB — ETHANOL

## 2018-05-08 LAB — VITAMIN B12: VITAMIN B 12: 271 pg/mL (ref 180–914)

## 2018-05-08 LAB — AMMONIA: Ammonia: 22 umol/L (ref 9–35)

## 2018-05-08 MED ORDER — LORAZEPAM 2 MG/ML IJ SOLN
1.0000 mg | Freq: Four times a day (QID) | INTRAMUSCULAR | Status: AC | PRN
  Administered 2018-05-09 – 2018-05-10 (×2): 1 mg via INTRAVENOUS
  Filled 2018-05-08 (×2): qty 1

## 2018-05-08 MED ORDER — FUROSEMIDE 40 MG PO TABS
40.0000 mg | ORAL_TABLET | Freq: Every day | ORAL | Status: DC
  Administered 2018-05-09 – 2018-05-15 (×7): 40 mg via ORAL
  Filled 2018-05-08 (×7): qty 1

## 2018-05-08 MED ORDER — ONDANSETRON HCL 4 MG/2ML IJ SOLN: 4.0000 mg | Freq: Four times a day (QID) | INTRAMUSCULAR | Status: DC | PRN

## 2018-05-08 MED ORDER — FOLIC ACID 1 MG PO TABS
1.0000 mg | ORAL_TABLET | Freq: Every day | ORAL | Status: DC
  Administered 2018-05-08 – 2018-05-15 (×8): 1 mg via ORAL
  Filled 2018-05-08 (×8): qty 1

## 2018-05-08 MED ORDER — VITAMIN B-1 100 MG PO TABS
100.0000 mg | ORAL_TABLET | Freq: Every day | ORAL | Status: DC
  Administered 2018-05-09 – 2018-05-15 (×7): 100 mg via ORAL
  Filled 2018-05-08 (×8): qty 1

## 2018-05-08 MED ORDER — THIAMINE HCL 100 MG/ML IJ SOLN
100.0000 mg | Freq: Every day | INTRAMUSCULAR | Status: DC
  Administered 2018-05-08: 100 mg via INTRAVENOUS
  Filled 2018-05-08 (×2): qty 2

## 2018-05-08 MED ORDER — ACETAMINOPHEN 500 MG PO TABS
1000.0000 mg | ORAL_TABLET | Freq: Once | ORAL | Status: AC
  Administered 2018-05-08: 1000 mg via ORAL
  Filled 2018-05-08: qty 2

## 2018-05-08 MED ORDER — POLYETHYLENE GLYCOL 3350 17 G PO PACK: 17.0000 g | PACK | Freq: Every day | ORAL | Status: DC | PRN

## 2018-05-08 MED ORDER — ACETAMINOPHEN 325 MG PO TABS: 650.0000 mg | ORAL_TABLET | Freq: Four times a day (QID) | ORAL | Status: DC | PRN

## 2018-05-08 MED ORDER — ONDANSETRON HCL 4 MG PO TABS: 4.0000 mg | ORAL_TABLET | Freq: Four times a day (QID) | ORAL | Status: DC | PRN

## 2018-05-08 MED ORDER — LORAZEPAM 1 MG PO TABS: 1.0000 mg | ORAL_TABLET | Freq: Four times a day (QID) | ORAL | Status: AC | PRN

## 2018-05-08 MED ORDER — ADULT MULTIVITAMIN W/MINERALS CH
1.0000 | ORAL_TABLET | Freq: Every day | ORAL | Status: DC
  Administered 2018-05-08 – 2018-05-15 (×8): 1 via ORAL
  Filled 2018-05-08 (×8): qty 1

## 2018-05-08 MED ORDER — ACETAMINOPHEN 650 MG RE SUPP: 650.0000 mg | Freq: Four times a day (QID) | RECTAL | Status: DC | PRN

## 2018-05-08 MED ORDER — AMLODIPINE BESYLATE 5 MG PO TABS
10.0000 mg | ORAL_TABLET | Freq: Every day | ORAL | Status: DC
  Administered 2018-05-08 – 2018-05-15 (×8): 10 mg via ORAL
  Filled 2018-05-08 (×7): qty 2

## 2018-05-08 MED ORDER — MORPHINE SULFATE ER 30 MG PO TBCR
30.0000 mg | EXTENDED_RELEASE_TABLET | Freq: Three times a day (TID) | ORAL | Status: DC
  Administered 2018-05-08 – 2018-05-15 (×20): 30 mg via ORAL
  Filled 2018-05-08 (×21): qty 1

## 2018-05-08 MED ORDER — SODIUM CHLORIDE 0.9 % IV BOLUS
1000.0000 mL | Freq: Once | INTRAVENOUS | Status: AC
  Administered 2018-05-08: 1000 mL via INTRAVENOUS

## 2018-05-08 MED ORDER — LOSARTAN POTASSIUM 50 MG PO TABS
100.0000 mg | ORAL_TABLET | Freq: Every evening | ORAL | Status: DC
  Administered 2018-05-08 – 2018-05-14 (×7): 100 mg via ORAL
  Filled 2018-05-08 (×7): qty 2

## 2018-05-08 MED ORDER — SERTRALINE HCL 50 MG PO TABS
100.0000 mg | ORAL_TABLET | Freq: Every day | ORAL | Status: DC
  Administered 2018-05-08 – 2018-05-15 (×8): 100 mg via ORAL
  Filled 2018-05-08 (×8): qty 2

## 2018-05-08 NOTE — ED Notes (Signed)
EDP at bedside  

## 2018-05-08 NOTE — ED Notes (Signed)
Pt transported to radiology.

## 2018-05-08 NOTE — ED Notes (Signed)
C-Collar placed on pt

## 2018-05-08 NOTE — ED Triage Notes (Signed)
Pt reports an unwitness fall on Sunday morning. Wife stated that pt has been falling a lot recently. Pt stated he hit his head, unsure of LOC. C/o ongoing lower back pain that worsens with movement. Pt sats 89 to 90% on RA. Placed on 2L nasal cannula by EMS.

## 2018-05-08 NOTE — H&P (Addendum)
History and Physical    [name]:[id number] DOB: 25-Jun-1946 DOA: 05/08/2018  PCP: Vivien Presto, MD   Patient coming from: Home  I have personally briefly reviewed patient's old medical records in [facility]  Chief Complaint: Falls, Confusion, visual hallucination  HPI: [name] is a 72 y.o. male with medical history significant for Ankylosing spondylitis, OSA, HTN, who was brought to the Ed by spouse, with reports of multiple falls and confusion since last fall 2 days ago- 11/3.  Fall was not witnessed, and patient cannot remember details of the fall. Spouse heard patient fall at about 6 AM in the morning, but she had to call their son to get patient up. Over the past 24 hours spouse reports increasing confusion and weakness.  Patient at baseline ambulates with a walker, gets his breakfast in the morning, takes care of himself for the most part but over the past 2 days he has been sitting in his wheelchair, able to take care of himself and transfers have been a big challenge.  At baseline he has some mild dementia, but he is able to make conversation and recognize family members.  Today patient reported seeing "small people in his room that fade away".  Denies auditory hallucinations.  Patient drinks 10 beers-bud light on a daily basis.  His last drink was 11/2, prior to fall.  Spouse reports his hands were shaking yesterday as he was unable to hold his beer.  He is unaware of prior alcohol withdrawal. Patient cannot remember details of the fall to me denies difficulty breathing or shortness of breath. Spouse herself has a ruptured disc, needs evaluation and treatment she is unable to take care of patient.  Patient at this time denies chest pain or shortness of breath, no vomiting or loose stools or abdominal pain, no dysuria, stable nocturia- 3 times a night, on lasix,   ED Course: Stable vitals. Cr 1.56, close to baseline 1.3( care everywhere last Cr 1.3-  10/2017), chronic stable anemia hemoglobin 11.9.  Unremarkable ammonia-22, normal TSH-2.25, negative blood alcohol level <10.  Two-view chest x-ray chronic bronchitic changes.  UA pending.  Blood cultures ordered head CT-no evidence of large acute infarct or intracranial hemorrhage.  Cervical spine CT-progressed fracture of C6 vertebrae.... fractures most likely unstable (see detailed report).  EKG prolonged PR interval sinus rhythm.  Neurosurgery consulted- Dr. Danielle Dess, who is also patient's outpatient neurosurgeon, recommended c-collar and patient to follow-up as outpatient.  Social work consulted in the ED-options given to patient. Patient admitted for persistent altered mental status, recurrent falls and concern for unsafe home environment.  Review of Systems: As per HPI all other systems reviewed and negative  Past Medical History:  Diagnosis Date  . Arthritis   . Hypertension     Past Surgical History:  Procedure Laterality Date  . ANKLE SURGERY    . BACK SURGERY    . JOINT REPLACEMENT    . NECK SURGERY    . TOTAL KNEE ARTHROPLASTY       reports that he has never smoked. He has never used smokeless tobacco. He reports that he drinks alcohol. He reports that he does not use drugs.  Allergies  Allergen Reactions  . Codeine Nausea Only    Family History  Problem Relation Age of Onset  . Heart disease Mother     Prior to Admission medications   Medication Sig Start Date End Date Taking? Authorizing Provider  amLODipine (NORVASC) 10 MG tablet Take  10 mg by mouth daily.     Yes [provider]  furosemide (LASIX) 40 MG tablet Take 40 mg by mouth every morning. 08/15/17  Yes [provider]  losartan (COZAAR) 100 MG tablet Take 100 mg by mouth every evening. 08/27/15  Yes [provider]  morphine (MS CONTIN) 30 MG 12 hr tablet Take 30 mg by mouth 3 (three) times daily.   Yes [provider]  naproxen sodium (ALEVE) 220 MG tablet Take 440 mg by  mouth daily as needed (for pain).   Yes [provider]  pregabalin (LYRICA) 150 MG capsule Take 150 mg by mouth 3 (three) times daily.     Yes [provider]  sertraline (ZOLOFT) 100 MG tablet Take 100 mg by mouth daily. 01/08/18  Yes [provider]    Physical Exam: Vitals:   05/08/18 1044 05/08/18 1046 05/08/18 1130 05/08/18 1330  BP:  (!) 136/94 110/67 122/66  Pulse:  71 60 62  Resp:  11 11 10   Temp:  98.7 F (37.1 C)    TempSrc:  Oral    SpO2:  98% 95% 96%  Weight: 108.9 kg     Height: 6' (1.829 m)       Constitutional: NAD, calm, comfortable Vitals:   05/08/18 1044 05/08/18 1046 05/08/18 1130 05/08/18 1330  BP:  (!) 136/94 110/67 122/66  Pulse:  71 60 62  Resp:  11 11 10   Temp:  98.7 F (37.1 C)    TempSrc:  Oral    SpO2:  98% 95% 96%  Weight: 108.9 kg     Height: 6' (1.829 m)      Eyes: PERRL, lids and conjunctivae normal ENMT: Mucous membranes are moist. Posterior pharynx clear of any exudate or lesions.  Rigid cervical c-collar  Neck: normal, supple, no masses, no thyromegaly Respiratory: clear to auscultation bilaterally, no wheezing, no crackles. Normal respiratory effort. No accessory muscle use.  Cardiovascular: Regular rate and rhythm, no murmurs / rubs / gallops. No extremity edema. 2+ pedal pulses.  Abdomen: no tenderness, no masses palpated. No hepatosplenomegaly. Bowel sounds positive.  Musculoskeletal: no clubbing / cyanosis. No joint deformity upper and lower extremities. Good ROM, no contractures. Normal muscle tone.  Skin: no rashes, lesions, ulcers. No induration Neurologic: CN 2-12 grossly intact. Sensation intact, Strength 4/5 in all 4.  Psychiatric: Awake and alert oriented to person and place.   Labs on Admission: I have personally reviewed following labs and imaging studies  CBC: Recent Labs  Lab 05/08/18 1201  WBC 9.4  NEUTROABS 7.5  HGB 11.9*  HCT 36.0*  MCV 92.1  PLT 170   Basic Metabolic Panel: Recent  Labs  Lab 05/08/18 1201  NA 133*  K 4.3  CL 99  CO2 24  GLUCOSE 115*  BUN 25*  CREATININE 1.56*  CALCIUM 8.7*   Liver Function Tests: Recent Labs  Lab 05/08/18 1201  AST 25  ALT 19  ALKPHOS 92  BILITOT 1.3*  PROT 7.7  ALBUMIN 3.8   No results for input(s): LIPASE, AMYLASE in the last 168 hours. Recent Labs  Lab 05/08/18 1201  AMMONIA 22   CBG: Recent Labs  Lab 05/08/18 1209  GLUCAP 104*   Thyroid Function Tests: Recent Labs    05/08/18 1202  TSH 2.250     Radiological Exams on Admission: Dg Chest 2 View  Result Date: 05/08/2018 CLINICAL DATA:  Larey Seat 2 days ago. Now with hypoxia. History of hypertension, nonsmoker. EXAM: CHEST -  2 VIEW COMPARISON:  Chest x-ray of February 14, 2018 and chest CT scan of January 16, 2013. FINDINGS: The lungs are adequately inflated. There is no focal infiltrate. The interstitial markings are mildly prominent at the left lung base and to a lesser extent at the right lung base. There is no alveolar infiltrate. There is no pleural effusion or pneumothorax. The cardiac silhouette is enlarged but stable. The pulmonary vascularity is not engorged. Nerve stimulator electrodes project posterior to T7 and T8. There is mild compression of the body of T8 with loss of height anteriorly of approximately 15-20%. There is calcification of the anterior longitudinal ligament of the thoracic spine. IMPRESSION: Chronic bronchitic changes. No alveolar pneumonia nor CHF. Stable cardiomegaly. No evidence of post traumatic injury of the thorax. Mild partial compression of the body of T8 was present on a chest CT scan of January 16, 2013. Electronically Signed   By: David  [place] M.D.   On: 05/08/2018 13:13   Ct Head Wo Contrast  Result Date: 05/08/2018 CLINICAL DATA:  72 year old male with multiple falls since 05/06/2018. Denies loss of consciousness. Hit head but not sure where. Initial encounter. EXAM: CT HEAD WITHOUT CONTRAST CT CERVICAL SPINE WITHOUT CONTRAST  TECHNIQUE: Multidetector CT imaging of the head and cervical spine was performed following the standard protocol without intravenous contrast. Multiplanar CT image reconstructions of the cervical spine were also generated. COMPARISON:  02/14/2018 CT head and cervical spine. FINDINGS: CT HEAD FINDINGS Brain: Streak artifact from occipital hardware. Taking this limitation into account, no intracranial hemorrhage or CT evidence of large acute infarct noted. Chronic microvascular changes. Global atrophy. No intracranial mass lesion noted on this unenhanced exam. Vascular: Vascular calcifications Skull: No skull fracture Sinuses/Orbits: No acute orbital abnormality. Visualized paranasal sinuses clear. Other: Mastoid air cells and middle ear cavities clear. CT CERVICAL SPINE FINDINGS Prior fusion occiput to posterior aspect C4. Confluent anterior osteophyte and fusion of portions of occiput to C5. Confluent anterior osteophyte with fusion C7-T1. Previously noted fracture involving the C6 vertebra has progressed. Any motion of cervical spine is occurring at this level as patient is fused above and below this region. Fracture extends from the anterior aspect of the C6 vertebra in oblique position on the right into the inferior right C6 facet and the C6-7 facet joint and on the left into the left lateral vertebral body. Fracture of the anterior wall of the transverse foramen bilaterally. Loss of height C6 vertebra with broad fracture line through the compressed vertebra (which is sclerotic above emboli fracture line). Fracture C6 spinous process. Surrounding soft tissue prominence may indicate associated hemorrhage/edema. Infection could not excluded in proper clinical setting. Fractures most likely unstable. Upper chest: No worrisome abnormality. Other: No worrisome neck mass identified. IMPRESSION: CT HEAD 1. Streak artifact from occipital hardware. Taking this limitation into account, no intracranial hemorrhage or CT  evidence of large acute infarct noted. 2. Chronic microvascular changes. Global atrophy. CT CERVICAL SPINE 1. Prior surgical fusion occiput to posterior aspect C4. 2. Confluent anterior osteophyte and fusion of portions of occiput to C5. Confluent anterior osteophyte with fusion C7-T1. 3. Previously noted fracture involving the C6 vertebra has progressed. Any motion of cervical spine is occurring at this level as patient is fused above and below this region. Fracture extends from the anterior aspect of the C6 vertebra in oblique position on the right into the inferior right C6 facet and the C6-7 facet joint and on the left into the left lateral vertebral body. Fracture of  the anterior wall of the transverse foramen bilaterally. Loss of height C6 vertebra with broad fracture line through the compressed vertebra (which is sclerotic above emboli fracture line). Fracture C6 spinous process. Surrounding soft tissue prominence may indicate associated hemorrhage/edema. Infection could not excluded in proper clinical setting. Fractures most likely unstable. These results were called by telephone at the time of interpretation on 05/08/2018 at 2:00 pm to Dr. Blane Ohara , who verbally acknowledged these results. Electronically Signed   By: Lacy Duverney M.D.   On: 05/08/2018 14:13   Ct Cervical Spine Wo Contrast  Result Date: 05/08/2018 CLINICAL DATA:  72 year old male with multiple falls since 05/06/2018. Denies loss of consciousness. Hit head but not sure where. Initial encounter. EXAM: CT HEAD WITHOUT CONTRAST CT CERVICAL SPINE WITHOUT CONTRAST TECHNIQUE: Multidetector CT imaging of the head and cervical spine was performed following the standard protocol without intravenous contrast. Multiplanar CT image reconstructions of the cervical spine were also generated. COMPARISON:  02/14/2018 CT head and cervical spine. FINDINGS: CT HEAD FINDINGS Brain: Streak artifact from occipital hardware. Taking this limitation into  account, no intracranial hemorrhage or CT evidence of large acute infarct noted. Chronic microvascular changes. Global atrophy. No intracranial mass lesion noted on this unenhanced exam. Vascular: Vascular calcifications Skull: No skull fracture Sinuses/Orbits: No acute orbital abnormality. Visualized paranasal sinuses clear. Other: Mastoid air cells and middle ear cavities clear. CT CERVICAL SPINE FINDINGS Prior fusion occiput to posterior aspect C4. Confluent anterior osteophyte and fusion of portions of occiput to C5. Confluent anterior osteophyte with fusion C7-T1. Previously noted fracture involving the C6 vertebra has progressed. Any motion of cervical spine is occurring at this level as patient is fused above and below this region. Fracture extends from the anterior aspect of the C6 vertebra in oblique position on the right into the inferior right C6 facet and the C6-7 facet joint and on the left into the left lateral vertebral body. Fracture of the anterior wall of the transverse foramen bilaterally. Loss of height C6 vertebra with broad fracture line through the compressed vertebra (which is sclerotic above emboli fracture line). Fracture C6 spinous process. Surrounding soft tissue prominence may indicate associated hemorrhage/edema. Infection could not excluded in proper clinical setting. Fractures most likely unstable. Upper chest: No worrisome abnormality. Other: No worrisome neck mass identified. IMPRESSION: CT HEAD 1. Streak artifact from occipital hardware. Taking this limitation into account, no intracranial hemorrhage or CT evidence of large acute infarct noted. 2. Chronic microvascular changes. Global atrophy. CT CERVICAL SPINE 1. Prior surgical fusion occiput to posterior aspect C4. 2. Confluent anterior osteophyte and fusion of portions of occiput to C5. Confluent anterior osteophyte with fusion C7-T1. 3. Previously noted fracture involving the C6 vertebra has progressed. Any motion of cervical  spine is occurring at this level as patient is fused above and below this region. Fracture extends from the anterior aspect of the C6 vertebra in oblique position on the right into the inferior right C6 facet and the C6-7 facet joint and on the left into the left lateral vertebral body. Fracture of the anterior wall of the transverse foramen bilaterally. Loss of height C6 vertebra with broad fracture line through the compressed vertebra (which is sclerotic above emboli fracture line). Fracture C6 spinous process. Surrounding soft tissue prominence may indicate associated hemorrhage/edema. Infection could not excluded in proper clinical setting. Fractures most likely unstable. These results were called by telephone at the time of interpretation on 05/08/2018 at 2:00 pm to Dr. Blane Ohara ,  who verbally acknowledged these results. Electronically Signed   By: Lacy Duverney M.D.   On: 05/08/2018 14:13    EKG: Independently reviewed.  prolonged PR- not new, QTC 435 sinus rhythm.  EKG essentially unchanged from prior  Assessment/Plan Active Problems:   Chronic pain syndrome   Essential hypertension   Ankylosing spondylitis (HCC)   Multiple falls   Multiple falls with cervical spine fracture-likely related to cervical spondylosis, cervical spine fusion history, chronic pain. (C6 spinal fracture, please see detailed report) he has no focal deficits at this time.  Patient's outpatient neurosurgeon on-call Dr. Danielle Dess recommended follow-up as outpatient for C-spine fracture, cervical collar.  CT negative for acute abnormality -PT eval -C-collar -Orthostatic vitals  Metabolic encephalopathy- stable vitals, unremarkable CBC, CMP, alcohol level , TSH and ammonia.  Neg Head CT.  blood cultures drawn, UA pending.  Exact etiology not identified, but likely multifactorial- Alcohol withdrawal (but last drink >48hrs ago), Lyrica-on quite a bit of Lyrica 150mg  TID considering renal insufficiency, increasing frailty  from multiple falls Vs progression of dementia. -Will get MRI brain -Follow-up UA, and blood cultures drawn in ED -Will treat for UTI if UA suggestive -Check B12, folate- add on  Ankylosing spondylitis-patient on chronic pain medications.  Cervical fusions in the past.  Follows with Dr. Danielle Dess neurosurgeon as outpatient. -Will continue chronic home morphine @ 30 mg 3 times daily -Hold Lyrica for now with metabolic encephalopathy -Continue sertraline  Alcohol abuse-10 beers daily bud lytes.  Last drink- 11/2.  - CIWA, multivitamins -Thiamine folate  Hypertension-stable. -Continue home losartan Lasix and Norvasc  First-degree AV block-chronic.  No chest pain or SOB.   HIV as part of routine health screening    DVT prophylaxis: Scds Code Status: Full Family Communication:None at bedside Disposition Plan: Per rounding team Consults called: None Admission status: Obs, Med- surg   Onnie Boer MD [facility] Pager [phone] From 3PM- 11PAM.  Otherwise please contact night-coverage [email or web address] Password [email or web address]  05/08/2018, 5:18 PM

## 2018-05-08 NOTE — ED Provider Notes (Signed)
[facility] EMERGENCY DEPARTMENT Provider Note   CSN: [id number] Arrival date & time: 05/08/18  1040     History   Chief Complaint Chief Complaint  Patient presents with  . Fall    HPI [name] is a 72 y.o. male.  Patient is a 72 year old male presenting with history of recurrent falls.  PMH significant for ankylosing spondylitis with spinal cord stimulator with history of cervical spine fractures (C5-C6) requiring narcotics, peripheral neuropathy, HTN, OSA on CPAP, and history of asbestos exposure.  Patient has a significant history for multiple mechanical falls with history of cervical spine fracture.  He is currently followed by neurosurgery for his ankylosing spondylitis and had a recent visit concerning these falls.  He underwent a myelogram which was unremarkable.  Patient does have a history concerning for dementia according to wife who is his primary caregiver.  As of 2 days ago, patient was dressing himself in the bathroom and fell forward hitting the door frame falling onto the back of his head.  This fall was unwitnessed.  Patient denies LOC or dizziness prior to this episode.  There is no history of focal weakness or dysarthria according to wife following the event.  She also without history of chest pain, shortness of breath, cough, nausea or vomiting or diarrhea, fevers or chills or fatigue.  Since the fall, patient appeared to be more confused according to wife.  Patient's wife has been seeing her primary care doctor for her right leg pain which patient states he is not aware of, yet wife states they have had extensive discussion regarding her pain.  She is a primary caregiver and is concerned that she can no longer manage his care.  Patient reports visual hallucinations with "seeing small people."  He however denies auditory hallucinations.  This is not typical for him according to his wife.     Past Medical History:  Diagnosis Date  . Arthritis   .  Hypertension     Patient Active Problem List   Diagnosis Date Noted  . 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 OBSTRUCTIVE SLEEP APNEA 08/20/2007  . CHRONIC PAIN SYNDROME 07/30/2007  . HYPERTENSION 07/30/2007  . 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 ANKLE SURGERY    . BACK SURGERY    . JOINT REPLACEMENT    . NECK SURGERY    . TOTAL KNEE ARTHROPLASTY          Home Medications    Prior to Admission medications   Medication Sig Start Date End Date Taking? Authorizing Provider  amLODipine (NORVASC) 10 MG tablet Take 10 mg by mouth daily.     Yes [provider]  furosemide (LASIX) 40 MG tablet Take 40 mg by mouth every morning. 08/15/17  Yes [provider]  losartan (COZAAR) 100 MG tablet Take 100 mg by mouth every evening. 08/27/15  Yes [provider]  morphine (MS CONTIN) 30 MG 12 hr tablet Take 30 mg by mouth 3 (three) times daily.   Yes [provider]  naproxen sodium (ALEVE) 220 MG tablet Take 440 mg by mouth daily as needed (for pain).   Yes [provider]  pregabalin (LYRICA) 150 MG capsule Take 150 mg by mouth 3 (three) times daily.     Yes [provider]  sertraline (ZOLOFT) 100 MG tablet Take 100 mg by mouth daily. 01/08/18  Yes [provider]    Family History Family History  Problem Relation Age  of Onset  . Heart disease Mother     Social History Social History   Tobacco Use  . Smoking status: Never Smoker  . Smokeless tobacco: Never Used  Substance Use Topics  . Alcohol use: Yes  . Drug use: No     Allergies   Codeine   Review of Systems Review of Systems  Constitutional: Negative for chills, diaphoresis, fatigue and fever.  HENT: Negative for ear pain and sore throat.   Eyes: Negative for pain and visual disturbance.  Respiratory: Negative for cough and shortness of breath.   Cardiovascular: Negative for chest pain and palpitations.    Gastrointestinal: Negative for abdominal pain, diarrhea, nausea and vomiting.  Genitourinary: Negative for dysuria and hematuria.  Musculoskeletal: Positive for back pain, gait problem and neck pain.  Skin: Negative for color change and rash.  Neurological: Positive for tremors. Negative for dizziness, seizures, syncope, facial asymmetry, speech difficulty, weakness, light-headedness, numbness and headaches.  Psychiatric/Behavioral: Positive for confusion and hallucinations. Negative for self-injury.  All other systems reviewed and are negative.    Physical Exam Updated Vital Signs BP 122/66   Pulse 62   Temp 98.7 F (37.1 C) (Oral)   Resp 10   Ht 6' (1.829 m)   Wt 108.9 kg   SpO2 96%   BMI 32.55 kg/m   Physical Exam  Constitutional: He appears well-developed. No distress.  Chronically ill-appearing male lying in bed  HENT:  Head: Normocephalic and atraumatic.  Scalp is without tenderness, fluctuance, or ecchymoses  Eyes: Pupils are equal, round, and reactive to light. Conjunctivae and EOM are normal.  Neck: Neck supple. No tracheal deviation present.  Significant restriction of cervical spine range of motion due to underlying fusion with mild tenderness throughout the cervical spine  Cardiovascular: Normal rate and regular rhythm.  No murmur heard. Pulmonary/Chest: Effort normal and breath sounds normal. No respiratory distress.  Abdominal: Soft. There is no tenderness.  Musculoskeletal: He exhibits no edema.  Moves all 4 extremities equally with 5/5 motor strength and no decreased sensation throughout  Lymphadenopathy:    He has no cervical adenopathy.  Neurological: He is alert. No cranial nerve deficit.  Oriented to self and place (stated it was 1999)  Skin: Skin is warm and dry. He is not diaphoretic.  Psychiatric: He has a normal mood and affect.  Nursing note and vitals reviewed.    ED Treatments / Results  Labs (all labs ordered are listed, but only  abnormal results are displayed) Labs Reviewed  CBC WITH DIFFERENTIAL/PLATELET - Abnormal; Notable for the following components:      Result Value   RBC 3.91 (*)    Hemoglobin 11.9 (*)    HCT 36.0 (*)    All other components within normal limits  COMPREHENSIVE METABOLIC PANEL - Abnormal; Notable for the following components:   Sodium 133 (*)    Glucose, Bld 115 (*)    BUN 25 (*)    Creatinine, Ser 1.56 (*)    Calcium 8.7 (*)    Total Bilirubin 1.3 (*)    GFR calc non Af Amer 43 (*)    GFR calc Af Amer 50 (*)    All other components within normal limits  CBG MONITORING, ED - Abnormal; Notable for the following components:   Glucose-Capillary 104 (*)    All other components within normal limits  CULTURE, BLOOD (ROUTINE X 2)  CULTURE, BLOOD (ROUTINE X 2)  AMMONIA  TSH  ETHANOL  URINALYSIS, ROUTINE W REFLEX MICROSCOPIC  EKG None  Radiology Dg Chest 2 View  Result Date: 05/08/2018 CLINICAL DATA:  Larey Seat 2 days ago. Now with hypoxia. History of hypertension, nonsmoker. EXAM: CHEST - 2 VIEW COMPARISON:  Chest x-ray of February 14, 2018 and chest CT scan of January 16, 2013. FINDINGS: The lungs are adequately inflated. There is no focal infiltrate. The interstitial markings are mildly prominent at the left lung base and to a lesser extent at the right lung base. There is no alveolar infiltrate. There is no pleural effusion or pneumothorax. The cardiac silhouette is enlarged but stable. The pulmonary vascularity is not engorged. Nerve stimulator electrodes project posterior to T7 and T8. There is mild compression of the body of T8 with loss of height anteriorly of approximately 15-20%. There is calcification of the anterior longitudinal ligament of the thoracic spine. IMPRESSION: Chronic bronchitic changes. No alveolar pneumonia nor CHF. Stable cardiomegaly. No evidence of post traumatic injury of the thorax. Mild partial compression of the body of T8 was present on a chest CT scan of January 16, 2013. Electronically Signed   By: Prentice Sackrider  [place] M.D.   On: 05/08/2018 13:13   Ct Head Wo Contrast  Result Date: 05/08/2018 CLINICAL DATA:  72 year old male with multiple falls since 05/06/2018. Denies loss of consciousness. Hit head but not sure where. Initial encounter. EXAM: CT HEAD WITHOUT CONTRAST CT CERVICAL SPINE WITHOUT CONTRAST TECHNIQUE: Multidetector CT imaging of the head and cervical spine was performed following the standard protocol without intravenous contrast. Multiplanar CT image reconstructions of the cervical spine were also generated. COMPARISON:  02/14/2018 CT head and cervical spine. FINDINGS: CT HEAD FINDINGS Brain: Streak artifact from occipital hardware. Taking this limitation into account, no intracranial hemorrhage or CT evidence of large acute infarct noted. Chronic microvascular changes. Global atrophy. No intracranial mass lesion noted on this unenhanced exam. Vascular: Vascular calcifications Skull: No skull fracture Sinuses/Orbits: No acute orbital abnormality. Visualized paranasal sinuses clear. Other: Mastoid air cells and middle ear cavities clear. CT CERVICAL SPINE FINDINGS Prior fusion occiput to posterior aspect C4. Confluent anterior osteophyte and fusion of portions of occiput to C5. Confluent anterior osteophyte with fusion C7-T1. Previously noted fracture involving the C6 vertebra has progressed. Any motion of cervical spine is occurring at this level as patient is fused above and below this region. Fracture extends from the anterior aspect of the C6 vertebra in oblique position on the right into the inferior right C6 facet and the C6-7 facet joint and on the left into the left lateral vertebral body. Fracture of the anterior wall of the transverse foramen bilaterally. Loss of height C6 vertebra with broad fracture line through the compressed vertebra (which is sclerotic above emboli fracture line). Fracture C6 spinous process. Surrounding soft tissue prominence may  indicate associated hemorrhage/edema. Infection could not excluded in proper clinical setting. Fractures most likely unstable. Upper chest: No worrisome abnormality. Other: No worrisome neck mass identified. IMPRESSION: CT HEAD 1. Streak artifact from occipital hardware. Taking this limitation into account, no intracranial hemorrhage or CT evidence of large acute infarct noted. 2. Chronic microvascular changes. Global atrophy. CT CERVICAL SPINE 1. Prior surgical fusion occiput to posterior aspect C4. 2. Confluent anterior osteophyte and fusion of portions of occiput to C5. Confluent anterior osteophyte with fusion C7-T1. 3. Previously noted fracture involving the C6 vertebra has progressed. Any motion of cervical spine is occurring at this level as patient is fused above and below this region. Fracture extends from the anterior aspect of the C6 vertebra  in oblique position on the right into the inferior right C6 facet and the C6-7 facet joint and on the left into the left lateral vertebral body. Fracture of the anterior wall of the transverse foramen bilaterally. Loss of height C6 vertebra with broad fracture line through the compressed vertebra (which is sclerotic above emboli fracture line). Fracture C6 spinous process. Surrounding soft tissue prominence may indicate associated hemorrhage/edema. Infection could not excluded in proper clinical setting. Fractures most likely unstable. These results were called by telephone at the time of interpretation on 05/08/2018 at 2:00 pm to Dr. Blane Ohara , who verbally acknowledged these results. Electronically Signed   By: Lacy Duverney M.D.   On: 05/08/2018 14:13   Ct Cervical Spine Wo Contrast  Result Date: 05/08/2018 CLINICAL DATA:  72 year old male with multiple falls since 05/06/2018. Denies loss of consciousness. Hit head but not sure where. Initial encounter. EXAM: CT HEAD WITHOUT CONTRAST CT CERVICAL SPINE WITHOUT CONTRAST TECHNIQUE: Multidetector CT imaging of  the head and cervical spine was performed following the standard protocol without intravenous contrast. Multiplanar CT image reconstructions of the cervical spine were also generated. COMPARISON:  02/14/2018 CT head and cervical spine. FINDINGS: CT HEAD FINDINGS Brain: Streak artifact from occipital hardware. Taking this limitation into account, no intracranial hemorrhage or CT evidence of large acute infarct noted. Chronic microvascular changes. Global atrophy. No intracranial mass lesion noted on this unenhanced exam. Vascular: Vascular calcifications Skull: No skull fracture Sinuses/Orbits: No acute orbital abnormality. Visualized paranasal sinuses clear. Other: Mastoid air cells and middle ear cavities clear. CT CERVICAL SPINE FINDINGS Prior fusion occiput to posterior aspect C4. Confluent anterior osteophyte and fusion of portions of occiput to C5. Confluent anterior osteophyte with fusion C7-T1. Previously noted fracture involving the C6 vertebra has progressed. Any motion of cervical spine is occurring at this level as patient is fused above and below this region. Fracture extends from the anterior aspect of the C6 vertebra in oblique position on the right into the inferior right C6 facet and the C6-7 facet joint and on the left into the left lateral vertebral body. Fracture of the anterior wall of the transverse foramen bilaterally. Loss of height C6 vertebra with broad fracture line through the compressed vertebra (which is sclerotic above emboli fracture line). Fracture C6 spinous process. Surrounding soft tissue prominence may indicate associated hemorrhage/edema. Infection could not excluded in proper clinical setting. Fractures most likely unstable. Upper chest: No worrisome abnormality. Other: No worrisome neck mass identified. IMPRESSION: CT HEAD 1. Streak artifact from occipital hardware. Taking this limitation into account, no intracranial hemorrhage or CT evidence of large acute infarct noted. 2.  Chronic microvascular changes. Global atrophy. CT CERVICAL SPINE 1. Prior surgical fusion occiput to posterior aspect C4. 2. Confluent anterior osteophyte and fusion of portions of occiput to C5. Confluent anterior osteophyte with fusion C7-T1. 3. Previously noted fracture involving the C6 vertebra has progressed. Any motion of cervical spine is occurring at this level as patient is fused above and below this region. Fracture extends from the anterior aspect of the C6 vertebra in oblique position on the right into the inferior right C6 facet and the C6-7 facet joint and on the left into the left lateral vertebral body. Fracture of the anterior wall of the transverse foramen bilaterally. Loss of height C6 vertebra with broad fracture line through the compressed vertebra (which is sclerotic above emboli fracture line). Fracture C6 spinous process. Surrounding soft tissue prominence may indicate associated hemorrhage/edema. Infection could not excluded  in proper clinical setting. Fractures most likely unstable. These results were called by telephone at the time of interpretation on 05/08/2018 at 2:00 pm to Dr. Blane Ohara , who verbally acknowledged these results. Electronically Signed   By: Lacy Duverney M.D.   On: 05/08/2018 14:13    Procedures Procedures (including critical care time)  Medications Ordered in ED Medications  sodium chloride 0.9 % bolus 1,000 mL ( Intravenous Stopped 05/08/18 1330)  acetaminophen (TYLENOL) tablet 1,000 mg (1,000 mg Oral Given 05/08/18 1356)     Initial Impression / Assessment and Plan / ED Course  I have reviewed the triage vital signs and the nursing notes.  Pertinent labs & imaging results that were available during my care of the patient were reviewed by me and considered in my medical decision making (see chart for details).  Patient is a 72 year old male presenting with history of recurrent falls.  PMH significant for ankylosing spondylitis with spinal cord  stimulator with history of cervical spine fractures (C5-C6) requiring narcotics, peripheral neuropathy, HTN, OSA on CPAP, and history of asbestos exposure.  Patient history of unwitnessed fall likely mechanical.  Sustained head trauma with new onset confusion compared to baseline according to wife.  There are concerns for subdural bleed.  He does have some cervical spine tenderness which is likely from his chronic ankylosing spondylitis with known fusions.  Will pursue CT and cervical spine without contrast to rule out fracture and bleeds.  Addition to his AMS, patient does have some visual hallucinations.  Patient does not have signs of infection.  He is on morphine for chronic pain along with Lyrica and Zoloft, unsure if these are contributing.  Patient placed on telemetry with EKG showing NSR 71 without ST changes or T wave abnormalities with QTC or 434.    Labs significant for stable anemia at 11.9, hyponatremia at 133 at baseline, creatinine 1.56 which is improved compared to 6 years ago.  CBG 104.  TSH and ammonia WNL. No acute findings on CXR.  CT head without acute findings.  CT cervical spine concerning for progressing C6 fracture involving vertebra with surrounding soft tissue prominence indicating associated hemorrhage/edema favoring unstable fracture.  C-collar placed.  Dr. Danielle Dess with neurosurgery consulted.  Sleep callback from Dr. Danielle Dess, he personally reviewed the CT head and cervical spine.  Recommended patient continue c-collar and follow-up outpatient.  Given patient's AMS without identified source, visual hallucinations, recurrent falls, concern for unsafe home environment, recommending hospitalization for further work-up.  Final Clinical Impressions(s) / ED Diagnoses   Final diagnoses:  Altered mental status, unspecified altered mental status type    ED Discharge Orders    None       Wendee Beavers, DO 05/08/18 1549    Blane Ohara, MD 05/11/18 [phone]

## 2018-05-08 NOTE — Clinical Social Work Note (Signed)
Met with patient and wife per consult order.  P stated he has been in pain since recent fall, and unable to ambulate without walker.  Wife states he actually has been wheelchair bound, and requires assistance with transfers, dressing, bathing.  She has a slipped disc that is causing her pain in her leg, and is due to see the Dr on Thursday.  Although there are adult children who are available to help at night, she has no help during the day.  CSW went over options of family support, [facility] and SNF rehab.  Explained that we could refer for help IF patient is admitted and IF pt has a PT eval recommending services. Referred wife to patient's PCP as resource.  No further questions at this time.

## 2018-05-09 ENCOUNTER — Observation Stay (HOSPITAL_COMMUNITY): Payer: Medicare Other

## 2018-05-09 DIAGNOSIS — G9340 Encephalopathy, unspecified: Secondary | ICD-10-CM | POA: Diagnosis present

## 2018-05-09 DIAGNOSIS — F0390 Unspecified dementia without behavioral disturbance: Secondary | ICD-10-CM | POA: Diagnosis present

## 2018-05-09 DIAGNOSIS — R296 Repeated falls: Secondary | ICD-10-CM | POA: Diagnosis present

## 2018-05-09 DIAGNOSIS — Z885 Allergy status to narcotic agent status: Secondary | ICD-10-CM | POA: Diagnosis not present

## 2018-05-09 DIAGNOSIS — S12500A Unspecified displaced fracture of sixth cervical vertebra, initial encounter for closed fracture: Secondary | ICD-10-CM | POA: Diagnosis present

## 2018-05-09 DIAGNOSIS — G4733 Obstructive sleep apnea (adult) (pediatric): Secondary | ICD-10-CM | POA: Diagnosis present

## 2018-05-09 DIAGNOSIS — D649 Anemia, unspecified: Secondary | ICD-10-CM | POA: Diagnosis present

## 2018-05-09 DIAGNOSIS — N183 Chronic kidney disease, stage 3 (moderate): Secondary | ICD-10-CM | POA: Diagnosis present

## 2018-05-09 DIAGNOSIS — G894 Chronic pain syndrome: Secondary | ICD-10-CM | POA: Diagnosis present

## 2018-05-09 DIAGNOSIS — W19XXXA Unspecified fall, initial encounter: Secondary | ICD-10-CM | POA: Diagnosis present

## 2018-05-09 DIAGNOSIS — E871 Hypo-osmolality and hyponatremia: Secondary | ICD-10-CM | POA: Diagnosis present

## 2018-05-09 DIAGNOSIS — R5381 Other malaise: Secondary | ICD-10-CM | POA: Diagnosis present

## 2018-05-09 DIAGNOSIS — R441 Visual hallucinations: Secondary | ICD-10-CM | POA: Diagnosis present

## 2018-05-09 DIAGNOSIS — I440 Atrioventricular block, first degree: Secondary | ICD-10-CM | POA: Diagnosis present

## 2018-05-09 DIAGNOSIS — Z77090 Contact with and (suspected) exposure to asbestos: Secondary | ICD-10-CM | POA: Diagnosis present

## 2018-05-09 DIAGNOSIS — Z981 Arthrodesis status: Secondary | ICD-10-CM | POA: Diagnosis not present

## 2018-05-09 DIAGNOSIS — M459 Ankylosing spondylitis of unspecified sites in spine: Secondary | ICD-10-CM | POA: Diagnosis present

## 2018-05-09 DIAGNOSIS — Z96659 Presence of unspecified artificial knee joint: Secondary | ICD-10-CM | POA: Diagnosis present

## 2018-05-09 DIAGNOSIS — G9341 Metabolic encephalopathy: Secondary | ICD-10-CM | POA: Diagnosis present

## 2018-05-09 DIAGNOSIS — M47812 Spondylosis without myelopathy or radiculopathy, cervical region: Secondary | ICD-10-CM | POA: Diagnosis present

## 2018-05-09 DIAGNOSIS — R4182 Altered mental status, unspecified: Secondary | ICD-10-CM | POA: Diagnosis present

## 2018-05-09 DIAGNOSIS — Z79899 Other long term (current) drug therapy: Secondary | ICD-10-CM | POA: Diagnosis not present

## 2018-05-09 DIAGNOSIS — G629 Polyneuropathy, unspecified: Secondary | ICD-10-CM | POA: Diagnosis present

## 2018-05-09 DIAGNOSIS — F10239 Alcohol dependence with withdrawal, unspecified: Secondary | ICD-10-CM | POA: Diagnosis present

## 2018-05-09 DIAGNOSIS — W2209XA Striking against other stationary object, initial encounter: Secondary | ICD-10-CM | POA: Diagnosis present

## 2018-05-09 DIAGNOSIS — I129 Hypertensive chronic kidney disease with stage 1 through stage 4 chronic kidney disease, or unspecified chronic kidney disease: Secondary | ICD-10-CM | POA: Diagnosis present

## 2018-05-09 LAB — BASIC METABOLIC PANEL
Anion gap: 9 (ref 5–15)
BUN: 25 mg/dL — AB (ref 8–23)
CHLORIDE: 102 mmol/L (ref 98–111)
CO2: 25 mmol/L (ref 22–32)
Calcium: 8.7 mg/dL — ABNORMAL LOW (ref 8.9–10.3)
Creatinine, Ser: 1.28 mg/dL — ABNORMAL HIGH (ref 0.61–1.24)
GFR calc Af Amer: >60 mL/min (ref >60)
GFR calc non Af Amer: 55 mL/min — ABNORMAL LOW (ref >60)
GLUCOSE: 93 mg/dL (ref 70–99)
POTASSIUM: 4.1 mmol/L (ref 3.5–5.1)
Sodium: 136 mmol/L (ref 135–145)

## 2018-05-09 LAB — URINALYSIS, ROUTINE W REFLEX MICROSCOPIC
BILIRUBIN URINE: NEGATIVE
Glucose, UA: NEGATIVE mg/dL
Ketones, ur: NEGATIVE mg/dL
LEUKOCYTES UA: NEGATIVE
Nitrite: NEGATIVE
PH: 6 (ref 5.0–8.0)
Protein, ur: NEGATIVE mg/dL
SPECIFIC GRAVITY, URINE: 1.01 (ref 1.005–1.030)

## 2018-05-09 LAB — FOLATE RBC
FOLATE, RBC: 1006 ng/mL (ref >498)
Folate, Hemolysate: 326.8 ng/mL
Hematocrit: 32.5 % — ABNORMAL LOW (ref 37.5–51.0)

## 2018-05-09 NOTE — NC FL2 (Signed)
[place] MEDICAID FL2 LEVEL OF CARE SCREENING TOOL     IDENTIFICATION  Patient Name: [name] Birthdate: Apr 24, 1946 Sex: male Admission Date (Current Location): 05/08/2018  [facility] and [place] Number:  [employer] and Address:  [facility],  [address] [place] [address]      Provider Number: [id number]  Attending Physician Name and Address:  Erick Blinks, DO  Relative Name and Phone Number:       Current Level of Care: Hospital Recommended Level of Care: Skilled Nursing Facility Prior Approval Number:    Date Approved/Denied:   PASRR Number: [id number]  Discharge Plan: SNF    Current Diagnoses: Patient Active Problem List   Diagnosis Date Noted  . Acute encephalopathy 05/09/2018  . Multiple falls 05/08/2018  . 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 OBSTRUCTIVE SLEEP APNEA 08/20/2007  . Chronic pain syndrome 07/30/2007  . Essential hypertension 07/30/2007  . Ankylosing spondylitis (HCC) 07/30/2007    Orientation RESPIRATION BLADDER Height & Weight     Self, Situation, Place  Normal Continent Weight: 108.9 kg Height:  6' (182.9 cm)  BEHAVIORAL SYMPTOMS/MOOD NEUROLOGICAL BOWEL NUTRITION STATUS  (None) (none) Continent Diet  AMBULATORY STATUS COMMUNICATION OF NEEDS Skin   Extensive Assist Verbally Normal                       Personal Care Assistance Level of Assistance  Bathing, Feeding, Dressing Bathing Assistance: Limited assistance Feeding assistance: Independent Dressing Assistance: Limited assistance     Functional Limitations Info  Sight, Hearing, Speech Sight Info: Adequate Hearing Info: Adequate Speech Info: Adequate    SPECIAL CARE FACTORS FREQUENCY  (heart healthy)     PT Frequency: 5X/W              Contractures Contractures Info: Not present    Additional Factors Info  Code Status, Allergies Code Status Info: full Allergies Info: codeine           Current Medications (05/09/2018):   This is the current hospital active medication list Current Facility-Administered Medications  Medication Dose Route Frequency Provider Last Rate Last Dose  . acetaminophen (TYLENOL) tablet 650 mg  650 mg Oral Q6H PRN Emokpae, Ejiroghene E, MD       Or  . acetaminophen (TYLENOL) suppository 650 mg  650 mg Rectal Q6H PRN Emokpae, Ejiroghene E, MD      . amLODipine (NORVASC) tablet 10 mg  10 mg Oral Daily Emokpae, Ejiroghene E, MD   10 mg at 05/09/18 0803  . folic acid (FOLVITE) tablet 1 mg  1 mg Oral Daily Emokpae, Ejiroghene E, MD   1 mg at 05/09/18 0803  . furosemide (LASIX) tablet 40 mg  40 mg Oral Daily Emokpae, Ejiroghene E, MD   40 mg at 05/09/18 0803  . LORazepam (ATIVAN) tablet 1 mg  1 mg Oral Q6H PRN Emokpae, Ejiroghene E, MD       Or  . LORazepam (ATIVAN) injection 1 mg  1 mg Intravenous Q6H PRN Emokpae, Ejiroghene E, MD   1 mg at 05/09/18 0039  . losartan (COZAAR) tablet 100 mg  100 mg Oral QPM Emokpae, Ejiroghene E, MD   100 mg at 05/08/18 2015  . morphine (MS CONTIN) 12 hr tablet 30 mg  30 mg Oral Q8H Emokpae, Ejiroghene E, MD   30 mg at 05/09/18 0802  . multivitamin with minerals tablet 1 tablet  1 tablet Oral Daily Emokpae, Ejiroghene E, MD   1 tablet at  05/09/18 0803  . ondansetron (ZOFRAN) tablet 4 mg  4 mg Oral Q6H PRN Emokpae, Ejiroghene E, MD       Or  . ondansetron (ZOFRAN) injection 4 mg  4 mg Intravenous Q6H PRN Emokpae, Ejiroghene E, MD      . polyethylene glycol (MIRALAX / GLYCOLAX) packet 17 g  17 g Oral Daily PRN Emokpae, Ejiroghene E, MD      . sertraline (ZOLOFT) tablet 100 mg  100 mg Oral Daily Emokpae, Ejiroghene E, MD   100 mg at 05/09/18 0803  . thiamine (VITAMIN B-1) tablet 100 mg  100 mg Oral Daily Emokpae, Ejiroghene E, MD   100 mg at 05/09/18 0803   Or  . thiamine (B-1) injection 100 mg  100 mg Intravenous Daily Emokpae, Ejiroghene E, MD   100 mg at 05/08/18 2016   Facility-Administered Medications Ordered in Other Encounters  Medication Dose Route  Frequency Provider Last Rate Last Dose  . ondansetron (ZOFRAN) 4 mg in sodium chloride 0.9 % 50 mL IVPB  4 mg Intravenous Q6H PRN Barnett Abu, MD         Discharge Medications: Please see discharge summary for a list of discharge medications.  Relevant Imaging Results:  Relevant Lab Results:   Additional Information [address]  [place], [place]

## 2018-05-09 NOTE — Plan of Care (Signed)
  Problem: Acute Rehab PT Goals(only PT should resolve) Goal: Pt Will Go Supine/Side To Sit Outcome: Progressing Flowsheets (Taken 05/09/2018 1200) Pt will go Supine/Side to Sit: with min guard assist Goal: Patient Will Transfer Sit To/From Stand Outcome: Progressing Flowsheets (Taken 05/09/2018 1200) Patient will transfer sit to/from stand: with min guard assist Goal: Pt Will Transfer Bed To Chair/Chair To Bed Outcome: Progressing Flowsheets (Taken 05/09/2018 1200) Pt will Transfer Bed to Chair/Chair to Bed: min guard assist Goal: Pt Will Ambulate Outcome: Progressing Flowsheets (Taken 05/09/2018 1200) Pt will Ambulate: 75 feet; with rolling walker; with min guard assist   12:01 PM, 05/09/18 Ocie Bob, MPT Physical Therapist with [facility] [phone] office [phone] mobile phone

## 2018-05-09 NOTE — Progress Notes (Signed)
Pt refused scheduled ER morphine at this time. Patient alert but drowsy. VSS. Will continue to monitor.

## 2018-05-09 NOTE — Evaluation (Signed)
Physical Therapy Evaluation Patient Details Name: [name] MRN: [id number] DOB: [birth year] Today's Date: 05/09/2018   History of Present Illness  [name] is a 72 y.o. male with medical history significant for Ankylosing spondylitis, OSA, HTN, who was brought to the Ed by spouse, with reports of multiple falls and confusion since last fall 2 days ago- 11/3.  Fall was not witnessed, and patient cannot remember details of the fall. Spouse heard patient fall at about 6 AM in the morning, but she had to call their son to get patient up. Over the past 24 hours spouse reports increasing confusion and weakness.  Patient at baseline ambulates with a walker, gets his breakfast in the morning, takes care of himself for the most part but over the past 2 days he has been sitting in his wheelchair, able to take care of himself and transfers have been a big challenge.  At baseline he has some mild dementia, but he is able to make conversation and recognize family members.  Today patient reported seeing "small people in his room that fade away".  Denies auditory hallucinations.     Clinical Impression  Patient required Max assist to don/doff cervical collar, demonstrates slow labored movement for sitting up at bedside with head of bed raised, mild difficulty with trunk control while seated at bedside with occasional leaning right/left, unsteady on feet with veering left/right and slowing of cadence once fatigued.  Patient at risk for falls once fatigued when not able to sit down soon enough.  Patient tolerated sitting up in chair after therapy - nursing staff notified.  Patient will benefit from continued physical therapy in hospital and recommended venue below to increase strength, balance, endurance for safe ADLs and gait.    Follow Up Recommendations SNF;Supervision/Assistance - 24 hour;Supervision for mobility/OOB    Equipment Recommendations  None recommended by PT    Recommendations  for Other Services       Precautions / Restrictions Precautions Precautions: Fall Precaution Comments: C6 cervical spine fracture Required Braces or Orthoses: Cervical Brace Cervical Brace: Hard collar;At all times Restrictions Weight Bearing Restrictions: No      Mobility  Bed Mobility Overal bed mobility: Needs Assistance Bed Mobility: Supine to Sit     Supine to sit: Mod assist;Min assist     General bed mobility comments: with head of bed rasied approximately 30 degrees  Transfers Overall transfer level: Needs assistance Equipment used: Rolling walker (2 wheeled) Transfers: Sit to/from [employer] Sit to Stand: Min assist;Mod assist Stand pivot transfers: Min assist;Mod assist       General transfer comment: slow labored movement, unsteady on feet  Ambulation/Gait Ambulation/Gait assistance: Min assist Gait Distance (Feet): 30 Feet Assistive device: Rolling walker (2 wheeled) Gait Pattern/deviations: Decreased step length - right;Decreased step length - left;Decreased stride length;Drifts right/left Gait velocity: decreased   General Gait Details: slow labored cadence with occasional drifting right/left, once fatigued slows cadence and walks closer to walls for possible support  Stairs            Wheelchair Mobility    Modified Rankin (Stroke Patients Only)       Balance Overall balance assessment: Needs assistance Sitting-balance support: Feet supported;No upper extremity supported Sitting balance-Leahy Scale: Good     Standing balance support: During functional activity;Bilateral upper extremity supported Standing balance-Leahy Scale: Fair Standing balance comment: using RW  Pertinent Vitals/Pain Pain Assessment: 0-10 Pain Score: 5  Pain Location: lower neck and low back Pain Descriptors / Indicators: Sore;Discomfort;Aching Pain Intervention(s): Limited activity within patient's  tolerance;Monitored during session    Home Living Family/patient expects to be discharged to:: Private residence Living Arrangements: Spouse/significant other Available Help at Discharge: Family Type of Home: House Home Access: Ramped entrance     Home Layout: Multi-level;Able to live on main level with bedroom/bathroom Home Equipment: Walker - 4 wheels;Walker - 2 wheels;Cane - single point;Shower seat;Bedside commode;Hospital bed;Wheelchair - manual      Prior Function Level of Independence: Independent with assistive device(s)         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, [employer]   Dominant Hand: Right    Extremity/Trunk Assessment   Upper Extremity Assessment Upper Extremity Assessment: Generalized weakness    Lower Extremity Assessment Lower Extremity Assessment: Generalized weakness    Cervical / Trunk Assessment Cervical / Trunk Assessment: Other exceptions Cervical / Trunk Exceptions: limited extension in Cervical spine secondary to spinal fusion pr patient  Communication   Communication: No difficulties  Cognition Arousal/Alertness: Awake/alert Behavior During Therapy: WFL for tasks assessed/performed Overall Cognitive Status: Within Functional Limits for tasks assessed                                        General Comments      Exercises     Assessment/Plan    PT Assessment Patient needs continued PT services  PT Problem List Decreased strength;Decreased activity tolerance;Decreased balance;Decreased mobility       PT Treatment Interventions Gait training;Stair training;Functional mobility training;Therapeutic activities;Therapeutic exercise    PT Goals (Current goals can be found in the Care Plan section)  Acute Rehab PT Goals Patient Stated Goal: return home after rehab PT Goal Formulation: With patient Time For Goal Achievement: 05/23/18 Potential to Achieve Goals: Good     Frequency Min 3X/week   Barriers to discharge        Co-evaluation               AM-PAC PT "6 Clicks" Daily Activity  Outcome Measure Difficulty turning over in bed (including adjusting bedclothes, sheets and blankets)?: Unable Difficulty moving from lying on back to sitting on the side of the bed? : Unable Difficulty sitting down on and standing up from a chair with arms (e.g., wheelchair, bedside commode, etc,.)?: Unable Help needed moving to and from a bed to chair (including a wheelchair)?: A Little Help needed walking in hospital room?: A Lot Help needed climbing 3-5 steps with a railing? : A Lot 6 Click Score: 10    End of Session Equipment Utilized During Treatment: Gait belt;Cervical collar Activity Tolerance: Patient tolerated treatment well;Patient limited by fatigue Patient left: in chair;with call bell/phone within reach;with chair alarm set Nurse Communication: Mobility status PT Visit Diagnosis: Unsteadiness on feet (R26.81);Other abnormalities of gait and mobility (R26.89);Muscle weakness (generalized) (M62.81)    Time: 1610-9604 PT Time Calculation (min) (ACUTE ONLY): 34 min   Charges:   PT Evaluation $PT Eval Moderate Complexity: 1 Mod PT Treatments $Therapeutic Activity: 23-37 mins        11:58 AM, 05/09/18 Ocie Bob, MPT Physical Therapist with [facility] [phone] office [phone] mobile phone

## 2018-05-09 NOTE — Clinical Social Work Placement (Signed)
   CLINICAL SOCIAL WORK PLACEMENT  NOTE  Date:  05/09/2018  Patient Details  Name: [name] MRN: [id number] Date of Birth: [birth year]  Clinical Social Work is seeking post-discharge placement for this patient at the Skilled  Nursing Facility level of care (*CSW will initial, date and re-position this form in  chart as items are completed):  Yes   Patient/family provided with [facility] Clinical Social Work Department's list of facilities offering this level of care within the geographic area requested by the patient (or if unable, by the patient's family).  Yes   Patient/family informed of their freedom to choose among providers that offer the needed level of care, that participate in Medicare, Medicaid or managed care program needed by the patient, have an available bed and are willing to accept the patient.  Yes   Patient/family informed of [facility]'s ownership interest in [facility] and [facility], as well as of the fact that they are under no obligation to receive care at these facilities.  PASRR submitted to EDS on 05/09/18     PASRR number received on 05/09/18     Existing PASRR number confirmed on       FL2 transmitted to all facilities in geographic area requested by pt/family on 05/09/18     FL2 transmitted to all facilities within larger geographic area on       Patient informed that his/her managed care company has contracts with or will negotiate with certain facilities, including the following:            Patient/family informed of bed offers received.  Patient chooses bed at       Physician recommends and patient chooses bed at      Patient to be transferred to   on  .  Patient to be transferred to facility by       Patient family notified on   of transfer.  Name of family member notified:        PHYSICIAN       Additional Comment: CSW consulted with patient and wife Windell Moulding about plan going forward.  Wife insists patient needs SNF  rehab as she is unable to physically manage him, and children are not available during the day.  Patient agreeable. Referral sent to [facility], [place], [facility].   _______________________________________________ Ida Rogue, LCSW 05/09/2018, 1:56 PM

## 2018-05-09 NOTE — Progress Notes (Signed)
PROGRESS NOTE    Neil ALESSIO  [name]:[id number] DOB: 1945/09/15 DOA: 05/08/2018 PCP: Vivien Presto, MD   Brief Narrative:  Per HPI from Dr. Mariea Clonts on 11/5: [name] is a 72 y.o. male with medical history significant for Ankylosing spondylitis, OSA, HTN, who was brought to the Ed by spouse, with reports of multiple falls and confusion since last fall 2 days ago- 11/3.  Fall was not witnessed, and patient cannot remember details of the fall. Spouse heard patient fall at about 6 AM in the morning, but she had to call their son to get patient up. Over the past 24 hours spouse reports increasing confusion and weakness.  Patient at baseline ambulates with a walker, gets his breakfast in the morning, takes care of himself for the most part but over the past 2 days he has been sitting in his wheelchair, able to take care of himself and transfers have been a big challenge.  At baseline he has some mild dementia, but he is able to make conversation and recognize family members.  Today patient reported seeing "small people in his room that fade away".  Denies auditory hallucinations.  Patient drinks 10 beers-bud light on a daily basis.  His last drink was 11/2, prior to fall.  Spouse reports his hands were shaking yesterday as he was unable to hold his beer.  He is unaware of prior alcohol withdrawal. Patient cannot remember details of the fall to me denies difficulty breathing or shortness of breath. Spouse herself has a ruptured disc, needs evaluation and treatment she is unable to take care of patient.  Patient at this time denies chest pain or shortness of breath, no vomiting or loose stools or abdominal pain, no dysuria, stable nocturia- 3 times a night, on lasix,    Assessment & Plan:   Active Problems:   Chronic pain syndrome   Essential hypertension   Ankylosing spondylitis (HCC)   Multiple falls   1. Acute metabolic encephalopathy-multifactorial; improving.  Work-up  with TSH and ammonia levels as well as B12 level and folic acid have been within normal limits.  Urinalysis without any signs of UTI and head CT negative for any acute findings.  It appears that much of this may be a component of either alcohol withdrawal versus high doses of Lyrica in the setting of chronic renal insufficiency.  Patient also has some history of dementia.  MRI of the brain was previously ordered, but could not be completed due to spinal cord stimulator.  May consider repeat head CT if mentation appears to worsen as well as EEG and further neurology evaluation as needed.  As of right now, he appears to be improving while off the Lyrica.  May resume at a lower dose on discharge that is appropriate for his creatinine clearance. 2. Multiple falls with C6 cervical spine fracture.  No neurological deficits noted in Dr. Danielle Dess of neurosurgery plans to follow-up outpatient.  Continue on cervical collar with PT evaluation recommending SNF/rehab. 3. Alcohol abuse.  Continue on CIWA protocol with multivitamins and thiamine as well as folic acid.  Apparently drinks 10-12 beers on a daily basis with last drink on 11/2.  Ativan as needed.  No sign of DTs currently. 4. CKD stage III.  Noted to have prior creatinine of 1.76 on 02/2012.  This has improved to creatinine of 1.28 currently.  Continue to monitor with repeat renal panel in a.m. 5. Urinary retention.  Foley catheter placed last night.  Plan to  wean as tolerated. No prior history of BPH. 6. Ankylosing spondylitis.  Continue chronic pain medications.  Patient follows up with Dr. Danielle Dess of neurosurgery for this.  Continue sertraline. 7. Hypertension-stable.  Continue home losartan, Lasix, and Norvasc. 8. First-degree AV block-chronic.  Asymptomatic.   DVT prophylaxis:SCDs Code Status: Full Family Communication: None at bedside Disposition Plan: Plan for ongoing evaluation of encephalopathy has needed, but this is improved after discontinuation of  Lyrica.  Will need SNF rehab placement.   Consultants:   Dr. Danielle Dess neurosurgery on phone who will follow patient in the outpatient setting due to C-spine fracture  Procedures:   None  Antimicrobials:   None   Subjective: Patient seen and evaluated today with no new acute complaints or concerns. No acute concerns or events noted overnight.  He is complaining of very little pain and is otherwise alert and oriented x3 this morning.  Objective: Vitals:   05/08/18 1951 05/09/18 0013 05/09/18 0201 05/09/18 0601  BP: 128/71 (!) 192/66 (!) 115/58 127/62  Pulse: 64 64 (!) 51 (!) 57  Resp: 18 18  18   Temp: (!) 97.5 F (36.4 C) 98.2 F (36.8 C)  97.6 F (36.4 C)  TempSrc: Oral Oral  Oral  SpO2: 95% 94%  95%  Weight:      Height:        Intake/Output Summary (Last 24 hours) at 05/09/2018 1102 Last data filed at 05/09/2018 0900 Gross per 24 hour  Intake 1240.88 ml  Output 1800 ml  Net -559.12 ml   Filed Weights   05/08/18 1044  Weight: 108.9 kg    Examination:  General exam: Appears calm and comfortable  Respiratory system: Clear to auscultation. Respiratory effort normal. Cardiovascular system: S1 & S2 heard, RRR. No JVD, murmurs, rubs, gallops or clicks. No pedal edema. Gastrointestinal system: Abdomen is nondistended, soft and nontender. No organomegaly or masses felt. Normal bowel sounds heard. Central nervous system: Alert and oriented. No focal neurological deficits.  C-collar in place. Extremities: Symmetric 5 x 5 power. Skin: No rashes, lesions or ulcers Psychiatry: Judgement and insight appear normal. Mood & affect appropriate.     Data Reviewed: I have personally reviewed following labs and imaging studies  CBC: Recent Labs  Lab 05/08/18 1201  WBC 9.4  NEUTROABS 7.5  HGB 11.9*  HCT 36.0*  MCV 92.1  PLT 170   Basic Metabolic Panel: Recent Labs  Lab 05/08/18 1201 05/09/18 0504  NA 133* 136  K 4.3 4.1  CL 99 102  CO2 24 25  GLUCOSE 115* 93    BUN 25* 25*  CREATININE 1.56* 1.28*  CALCIUM 8.7* 8.7*   GFR: Estimated Creatinine Clearance: 68.4 mL/min (A) (by C-G formula based on SCr of 1.28 mg/dL (H)). Liver Function Tests: Recent Labs  Lab 05/08/18 1201  AST 25  ALT 19  ALKPHOS 92  BILITOT 1.3*  PROT 7.7  ALBUMIN 3.8   No results for input(s): LIPASE, AMYLASE in the last 168 hours. Recent Labs  Lab 05/08/18 1201  AMMONIA 22   Coagulation Profile: No results for input(s): INR, PROTIME in the last 168 hours. Cardiac Enzymes: Recent Labs  Lab 05/08/18 1201  CKTOTAL 154   BNP (last 3 results) No results for input(s): PROBNP in the last 8760 hours. HbA1C: No results for input(s): HGBA1C in the last 72 hours. CBG: Recent Labs  Lab 05/08/18 1209  GLUCAP 104*   Lipid Profile: No results for input(s): CHOL, HDL, LDLCALC, TRIG, CHOLHDL, LDLDIRECT in the last 72  hours. Thyroid Function Tests: Recent Labs    05/08/18 1202  TSH 2.250   Anemia Panel: Recent Labs    05/08/18 1748  VITAMINB12 271   Sepsis Labs: No results for input(s): PROCALCITON, LATICACIDVEN in the last 168 hours.  Recent Results (from the past 240 hour(s))  Culture, blood (routine x 2)     Status: None (Preliminary result)   Collection Time: 05/08/18 12:02 PM  Result Value Ref Range Status   Specimen Description BLOOD SITE NOT SPECIFIED DRAWN BY RN  Final   Special Requests   Final    BOTTLES DRAWN AEROBIC AND ANAEROBIC Blood Culture adequate volume   Culture   Final    NO GROWTH < 24 HOURS Performed at [facility], [address] [place], [place] [address]    Report Status PENDING  Incomplete  Culture, blood (routine x 2)     Status: None (Preliminary result)   Collection Time: 05/08/18 12:49 PM  Result Value Ref Range Status   Specimen Description BLOOD RIGHT HAND  Final   Special Requests   Final    BOTTLES DRAWN AEROBIC AND ANAEROBIC Blood Culture adequate volume   Culture   Final    NO GROWTH < 24 HOURS Performed at  [facility], [address] [place], [place] [address]    Report Status PENDING  Incomplete         Radiology Studies: Dg Chest 2 View  Result Date: 05/08/2018 CLINICAL DATA:  Larey Seat 2 days ago. Now with hypoxia. History of hypertension, nonsmoker. EXAM: CHEST - 2 VIEW COMPARISON:  Chest x-ray of February 14, 2018 and chest CT scan of January 16, 2013. FINDINGS: The lungs are adequately inflated. There is no focal infiltrate. The interstitial markings are mildly prominent at the left lung base and to a lesser extent at the right lung base. There is no alveolar infiltrate. There is no pleural effusion or pneumothorax. The cardiac silhouette is enlarged but stable. The pulmonary vascularity is not engorged. Nerve stimulator electrodes project posterior to T7 and T8. There is mild compression of the body of T8 with loss of height anteriorly of approximately 15-20%. There is calcification of the anterior longitudinal ligament of the thoracic spine. IMPRESSION: Chronic bronchitic changes. No alveolar pneumonia nor CHF. Stable cardiomegaly. No evidence of post traumatic injury of the thorax. Mild partial compression of the body of T8 was present on a chest CT scan of January 16, 2013. Electronically Signed   By: David  [place] M.D.   On: 05/08/2018 13:13   Ct Head Wo Contrast  Result Date: 05/08/2018 CLINICAL DATA:  72 year old male with multiple falls since 05/06/2018. Denies loss of consciousness. Hit head but not sure where. Initial encounter. EXAM: CT HEAD WITHOUT CONTRAST CT CERVICAL SPINE WITHOUT CONTRAST TECHNIQUE: Multidetector CT imaging of the head and cervical spine was performed following the standard protocol without intravenous contrast. Multiplanar CT image reconstructions of the cervical spine were also generated. COMPARISON:  02/14/2018 CT head and cervical spine. FINDINGS: CT HEAD FINDINGS Brain: Streak artifact from occipital hardware. Taking this limitation into account, no intracranial  hemorrhage or CT evidence of large acute infarct noted. Chronic microvascular changes. Global atrophy. No intracranial mass lesion noted on this unenhanced exam. Vascular: Vascular calcifications Skull: No skull fracture Sinuses/Orbits: No acute orbital abnormality. Visualized paranasal sinuses clear. Other: Mastoid air cells and middle ear cavities clear. CT CERVICAL SPINE FINDINGS Prior fusion occiput to posterior aspect C4. Confluent anterior osteophyte and fusion of portions of occiput to  C5. Confluent anterior osteophyte with fusion C7-T1. Previously noted fracture involving the C6 vertebra has progressed. Any motion of cervical spine is occurring at this level as patient is fused above and below this region. Fracture extends from the anterior aspect of the C6 vertebra in oblique position on the right into the inferior right C6 facet and the C6-7 facet joint and on the left into the left lateral vertebral body. Fracture of the anterior wall of the transverse foramen bilaterally. Loss of height C6 vertebra with broad fracture line through the compressed vertebra (which is sclerotic above emboli fracture line). Fracture C6 spinous process. Surrounding soft tissue prominence may indicate associated hemorrhage/edema. Infection could not excluded in proper clinical setting. Fractures most likely unstable. Upper chest: No worrisome abnormality. Other: No worrisome neck mass identified. IMPRESSION: CT HEAD 1. Streak artifact from occipital hardware. Taking this limitation into account, no intracranial hemorrhage or CT evidence of large acute infarct noted. 2. Chronic microvascular changes. Global atrophy. CT CERVICAL SPINE 1. Prior surgical fusion occiput to posterior aspect C4. 2. Confluent anterior osteophyte and fusion of portions of occiput to C5. Confluent anterior osteophyte with fusion C7-T1. 3. Previously noted fracture involving the C6 vertebra has progressed. Any motion of cervical spine is occurring at this  level as patient is fused above and below this region. Fracture extends from the anterior aspect of the C6 vertebra in oblique position on the right into the inferior right C6 facet and the C6-7 facet joint and on the left into the left lateral vertebral body. Fracture of the anterior wall of the transverse foramen bilaterally. Loss of height C6 vertebra with broad fracture line through the compressed vertebra (which is sclerotic above emboli fracture line). Fracture C6 spinous process. Surrounding soft tissue prominence may indicate associated hemorrhage/edema. Infection could not excluded in proper clinical setting. Fractures most likely unstable. These results were called by telephone at the time of interpretation on 05/08/2018 at 2:00 pm to Dr. Blane Ohara , who verbally acknowledged these results. Electronically Signed   By: Lacy Duverney M.D.   On: 05/08/2018 14:13   Ct Cervical Spine Wo Contrast  Result Date: 05/08/2018 CLINICAL DATA:  72 year old male with multiple falls since 05/06/2018. Denies loss of consciousness. Hit head but not sure where. Initial encounter. EXAM: CT HEAD WITHOUT CONTRAST CT CERVICAL SPINE WITHOUT CONTRAST TECHNIQUE: Multidetector CT imaging of the head and cervical spine was performed following the standard protocol without intravenous contrast. Multiplanar CT image reconstructions of the cervical spine were also generated. COMPARISON:  02/14/2018 CT head and cervical spine. FINDINGS: CT HEAD FINDINGS Brain: Streak artifact from occipital hardware. Taking this limitation into account, no intracranial hemorrhage or CT evidence of large acute infarct noted. Chronic microvascular changes. Global atrophy. No intracranial mass lesion noted on this unenhanced exam. Vascular: Vascular calcifications Skull: No skull fracture Sinuses/Orbits: No acute orbital abnormality. Visualized paranasal sinuses clear. Other: Mastoid air cells and middle ear cavities clear. CT CERVICAL SPINE FINDINGS  Prior fusion occiput to posterior aspect C4. Confluent anterior osteophyte and fusion of portions of occiput to C5. Confluent anterior osteophyte with fusion C7-T1. Previously noted fracture involving the C6 vertebra has progressed. Any motion of cervical spine is occurring at this level as patient is fused above and below this region. Fracture extends from the anterior aspect of the C6 vertebra in oblique position on the right into the inferior right C6 facet and the C6-7 facet joint and on the left into the left lateral vertebral body. Fracture  of the anterior wall of the transverse foramen bilaterally. Loss of height C6 vertebra with broad fracture line through the compressed vertebra (which is sclerotic above emboli fracture line). Fracture C6 spinous process. Surrounding soft tissue prominence may indicate associated hemorrhage/edema. Infection could not excluded in proper clinical setting. Fractures most likely unstable. Upper chest: No worrisome abnormality. Other: No worrisome neck mass identified. IMPRESSION: CT HEAD 1. Streak artifact from occipital hardware. Taking this limitation into account, no intracranial hemorrhage or CT evidence of large acute infarct noted. 2. Chronic microvascular changes. Global atrophy. CT CERVICAL SPINE 1. Prior surgical fusion occiput to posterior aspect C4. 2. Confluent anterior osteophyte and fusion of portions of occiput to C5. Confluent anterior osteophyte with fusion C7-T1. 3. Previously noted fracture involving the C6 vertebra has progressed. Any motion of cervical spine is occurring at this level as patient is fused above and below this region. Fracture extends from the anterior aspect of the C6 vertebra in oblique position on the right into the inferior right C6 facet and the C6-7 facet joint and on the left into the left lateral vertebral body. Fracture of the anterior wall of the transverse foramen bilaterally. Loss of height C6 vertebra with broad fracture line  through the compressed vertebra (which is sclerotic above emboli fracture line). Fracture C6 spinous process. Surrounding soft tissue prominence may indicate associated hemorrhage/edema. Infection could not excluded in proper clinical setting. Fractures most likely unstable. These results were called by telephone at the time of interpretation on 05/08/2018 at 2:00 pm to Dr. Blane Ohara , who verbally acknowledged these results. Electronically Signed   By: Lacy Duverney M.D.   On: 05/08/2018 14:13        Scheduled Meds: . amLODipine  10 mg Oral Daily  . folic acid  1 mg Oral Daily  . furosemide  40 mg Oral Daily  . losartan  100 mg Oral QPM  . morphine  30 mg Oral Q8H  . multivitamin with minerals  1 tablet Oral Daily  . sertraline  100 mg Oral Daily  . thiamine  100 mg Oral Daily   Or  . thiamine  100 mg Intravenous Daily   Continuous Infusions:   LOS: 0 days    Time spent: 30 minutes    Salvadore Valvano Hoover Brunette, DO [facility] Pager [phone]  If 7PM-7AM, please contact night-coverage [email or web address] Password [email or web address] 05/09/2018, 11:02 AM

## 2018-05-09 NOTE — Care Management Obs Status (Signed)
MEDICARE OBSERVATION STATUS NOTIFICATION   Patient Details  Name: [name] MRN: [id number] Date of Birth: Sep 03, 1945   Medicare Observation Status Notification Given:       Renie Ora 05/09/2018, 12:12 PM

## 2018-05-09 NOTE — Progress Notes (Signed)
Notified dr. Julian Reil that pt CIWA > 10. Pt agitated. Continues to remove tele monitor; mittens applied to pt hands to prevent removal of essential devices. Pt has not voided since arrival. Bladder scan presents >999. New order for foley placement. Labs AM

## 2018-05-09 NOTE — Progress Notes (Signed)
Informed by RN that patient without voiding since arrival.  Bladder scan reveals >91ml.  Ordering foley placement, BMP in AM.

## 2018-05-10 DIAGNOSIS — G9340 Encephalopathy, unspecified: Secondary | ICD-10-CM

## 2018-05-10 LAB — HIV ANTIBODY (ROUTINE TESTING W REFLEX): HIV SCREEN 4TH GENERATION: NONREACTIVE

## 2018-05-10 LAB — BASIC METABOLIC PANEL
Anion gap: 9 (ref 5–15)
BUN: 22 mg/dL (ref 8–23)
CALCIUM: 9.2 mg/dL (ref 8.9–10.3)
CHLORIDE: 103 mmol/L (ref 98–111)
CO2: 26 mmol/L (ref 22–32)
CREATININE: 1.19 mg/dL (ref 0.61–1.24)
GFR calc Af Amer: >60 mL/min (ref >60)
GFR calc non Af Amer: >60 mL/min (ref >60)
GLUCOSE: 105 mg/dL — AB (ref 70–99)
Potassium: 3.7 mmol/L (ref 3.5–5.1)
Sodium: 138 mmol/L (ref 135–145)

## 2018-05-10 LAB — CBC
HCT: 38.6 % — ABNORMAL LOW (ref 39.0–52.0)
HEMOGLOBIN: 12.8 g/dL — AB (ref 13.0–17.0)
MCH: 30.8 pg (ref 26.0–34.0)
MCHC: 33.2 g/dL (ref 30.0–36.0)
MCV: 92.8 fL (ref 80.0–100.0)
Platelets: 182 10*3/uL (ref 150–400)
RBC: 4.16 MIL/uL — ABNORMAL LOW (ref 4.22–5.81)
RDW: 14.4 % (ref 11.5–15.5)
WBC: 6.7 10*3/uL (ref 4.0–10.5)
nRBC: 0 % (ref 0.0–0.2)

## 2018-05-10 MED ORDER — ENOXAPARIN SODIUM 40 MG/0.4ML ~~LOC~~ SOLN
40.0000 mg | Freq: Every day | SUBCUTANEOUS | Status: DC
  Administered 2018-05-10 – 2018-05-15 (×6): 40 mg via SUBCUTANEOUS
  Filled 2018-05-10 (×6): qty 0.4

## 2018-05-10 MED ORDER — PREGABALIN 75 MG PO CAPS
150.0000 mg | ORAL_CAPSULE | Freq: Three times a day (TID) | ORAL | Status: DC
  Administered 2018-05-10 – 2018-05-15 (×15): 150 mg via ORAL
  Filled 2018-05-10 (×15): qty 2

## 2018-05-10 NOTE — Progress Notes (Signed)
PROGRESS NOTE    [name]  ZOX:[id number] DOB: 03-Aug-1945 DOA: 05/08/2018 PCP: Vivien Presto, MD  Brief Narrative:  72 year old with past medical history relevant for enclosing spondylitis, obstructive sleep apnea, hypertension, dementia, alcohol abuse admitted with recurrent falls, unstable C6 fracture and altered mental status likely secondary to alcohol withdrawal pending placement.   Assessment & Plan:   Active Problems:   Chronic pain syndrome   Essential hypertension   Ankylosing spondylitis (HCC)   Multiple falls   Acute encephalopathy   #) Alcohol abuse/withdrawal/altered mental status: His altered mental status is resolved.  Suspect most likely due to alcohol withdrawal.  His last drink was on 05/05/2018 and since then patient has been falling and noted to be altered. -Continue CIWA protocol -Continue thiamine and folate supplementation  #) Unstable C6 fracture: This was in the setting of recurrent falls.  Neurosurgery Dr. Danielle Dess has signed off and patient will see him as an outpatient in the clinic. -Continue cervical collar -Pending placement  #) Chronic pain: - Continue MS Contin 30 mill grams 3 times daily -Continue pregabalin 150 mg 3 times daily  #) Hypertension/lower extremity edema: -Continue furosemide 40 mg daily -Continue amlodipine 10 mg daily -Continue losartan 100 mg nightly  #) Pain/psych: -Continue above pain medications -Continue sertraline 100 mg daily  Fluids: Tolerating p.o. Elect lites: Monitor and supplement Nutrition: Heart healthy diet   Prophylaxis: Enoxaparin  Disposition: Pending skilled nursing facility placement  Full code     Consultants:   [facility], Dr. Danielle Dess  Procedures:   none  Antimicrobials:  none    Subjective: Patient reports he is doing fairly well this morning.  He reports only pain in his neck.  He denies any nausea, vomiting, diarrhea, cough, congestion.  Objective: Vitals:   05/09/18  1502 05/09/18 1801 05/10/18 0027 05/10/18 0600  BP: 120/66 137/61 (!) 152/84 129/65  Pulse: 63 68 91 60  Resp: 18 20 18 16   Temp: 98.5 F (36.9 C) 97.7 F (36.5 C) 98.7 F (37.1 C) 97.9 F (36.6 C)  TempSrc: Oral Oral Oral   SpO2: 92% 91% 95% 95%  Weight:      Height:        Intake/Output Summary (Last 24 hours) at 05/10/2018 1212 Last data filed at 05/10/2018 0900 Gross per 24 hour  Intake 240 ml  Output 2900 ml  Net -2660 ml   Filed Weights   05/08/18 1044  Weight: 108.9 kg    Examination:  General exam: Appears calm and comfortable  Respiratory system: Clear to auscultation. Respiratory effort normal. Cardiovascular system: Regular rate and rhythm, no murmurs. Gastrointestinal system: Abdomen is nondistended, soft and nontender. No organomegaly or masses felt. Normal bowel sounds heard. Central nervous system: Alert and oriented.  Is intact, moving all extremities, some midline tenderness, cervical collar in place Extremities: Trace lower extremity edema Skin: Contusions noted Psychiatry: Judgement and insight appear normal. Mood & affect appropriate.     Data Reviewed: I have personally reviewed following labs and imaging studies  CBC: Recent Labs  Lab 05/08/18 1201 05/08/18 1748 05/10/18 0524  WBC 9.4  --  6.7  NEUTROABS 7.5  --   --   HGB 11.9*  --  12.8*  HCT 36.0* 32.5* 38.6*  MCV 92.1  --  92.8  PLT 170  --  182   Basic Metabolic Panel: Recent Labs  Lab 05/08/18 1201 05/09/18 0504 05/10/18 0524  NA 133* 136 138  K 4.3 4.1 3.7  CL 99  102 103  CO2 24 25 26   GLUCOSE 115* 93 105*  BUN 25* 25* 22  CREATININE 1.56* 1.28* 1.19  CALCIUM 8.7* 8.7* 9.2   GFR: Estimated Creatinine Clearance: 73.6 mL/min (by C-G formula based on SCr of 1.19 mg/dL). Liver Function Tests: Recent Labs  Lab 05/08/18 1201  AST 25  ALT 19  ALKPHOS 92  BILITOT 1.3*  PROT 7.7  ALBUMIN 3.8   No results for input(s): LIPASE, AMYLASE in the last 168 hours. Recent  Labs  Lab 05/08/18 1201  AMMONIA 22   Coagulation Profile: No results for input(s): INR, PROTIME in the last 168 hours. Cardiac Enzymes: Recent Labs  Lab 05/08/18 1201  CKTOTAL 154   BNP (last 3 results) No results for input(s): PROBNP in the last 8760 hours. HbA1C: No results for input(s): HGBA1C in the last 72 hours. CBG: Recent Labs  Lab 05/08/18 1209  GLUCAP 104*   Lipid Profile: No results for input(s): CHOL, HDL, LDLCALC, TRIG, CHOLHDL, LDLDIRECT in the last 72 hours. Thyroid Function Tests: Recent Labs    05/08/18 1202  TSH 2.250   Anemia Panel: Recent Labs    05/08/18 1748  VITAMINB12 271   Sepsis Labs: No results for input(s): PROCALCITON, LATICACIDVEN in the last 168 hours.  Recent Results (from the past 240 hour(s))  Culture, blood (routine x 2)     Status: None (Preliminary result)   Collection Time: 05/08/18 12:02 PM  Result Value Ref Range Status   Specimen Description BLOOD SITE NOT SPECIFIED DRAWN BY RN  Final   Special Requests   Final    BOTTLES DRAWN AEROBIC AND ANAEROBIC Blood Culture adequate volume   Culture   Final    NO GROWTH 2 DAYS Performed at [facility], [address] [place], [place] [address]    Report Status PENDING  Incomplete  Culture, blood (routine x 2)     Status: None (Preliminary result)   Collection Time: 05/08/18 12:49 PM  Result Value Ref Range Status   Specimen Description BLOOD RIGHT HAND  Final   Special Requests   Final    BOTTLES DRAWN AEROBIC AND ANAEROBIC Blood Culture adequate volume   Culture   Final    NO GROWTH 2 DAYS Performed at [facility], [address], [place], [place] [address]    Report Status PENDING  Incomplete         Radiology Studies: Dg Chest 2 View  Result Date: 05/08/2018 CLINICAL DATA:  Larey Seat 2 days ago. Now with hypoxia. History of hypertension, nonsmoker. EXAM: CHEST - 2 VIEW COMPARISON:  Chest x-ray of February 14, 2018 and chest CT scan of January 16, 2013. FINDINGS: The  lungs are adequately inflated. There is no focal infiltrate. The interstitial markings are mildly prominent at the left lung base and to a lesser extent at the right lung base. There is no alveolar infiltrate. There is no pleural effusion or pneumothorax. The cardiac silhouette is enlarged but stable. The pulmonary vascularity is not engorged. Nerve stimulator electrodes project posterior to T7 and T8. There is mild compression of the body of T8 with loss of height anteriorly of approximately 15-20%. There is calcification of the anterior longitudinal ligament of the thoracic spine. IMPRESSION: Chronic bronchitic changes. No alveolar pneumonia nor CHF. Stable cardiomegaly. No evidence of post traumatic injury of the thorax. Mild partial compression of the body of T8 was present on a chest CT scan of January 16, 2013. Electronically Signed   By: David  [place]  M.D.   On: 05/08/2018 13:13   Ct Head Wo Contrast  Result Date: 05/08/2018 CLINICAL DATA:  72 year old male with multiple falls since 05/06/2018. Denies loss of consciousness. Hit head but not sure where. Initial encounter. EXAM: CT HEAD WITHOUT CONTRAST CT CERVICAL SPINE WITHOUT CONTRAST TECHNIQUE: Multidetector CT imaging of the head and cervical spine was performed following the standard protocol without intravenous contrast. Multiplanar CT image reconstructions of the cervical spine were also generated. COMPARISON:  02/14/2018 CT head and cervical spine. FINDINGS: CT HEAD FINDINGS Brain: Streak artifact from occipital hardware. Taking this limitation into account, no intracranial hemorrhage or CT evidence of large acute infarct noted. Chronic microvascular changes. Global atrophy. No intracranial mass lesion noted on this unenhanced exam. Vascular: Vascular calcifications Skull: No skull fracture Sinuses/Orbits: No acute orbital abnormality. Visualized paranasal sinuses clear. Other: Mastoid air cells and middle ear cavities clear. CT CERVICAL SPINE  FINDINGS Prior fusion occiput to posterior aspect C4. Confluent anterior osteophyte and fusion of portions of occiput to C5. Confluent anterior osteophyte with fusion C7-T1. Previously noted fracture involving the C6 vertebra has progressed. Any motion of cervical spine is occurring at this level as patient is fused above and below this region. Fracture extends from the anterior aspect of the C6 vertebra in oblique position on the right into the inferior right C6 facet and the C6-7 facet joint and on the left into the left lateral vertebral body. Fracture of the anterior wall of the transverse foramen bilaterally. Loss of height C6 vertebra with broad fracture line through the compressed vertebra (which is sclerotic above emboli fracture line). Fracture C6 spinous process. Surrounding soft tissue prominence may indicate associated hemorrhage/edema. Infection could not excluded in proper clinical setting. Fractures most likely unstable. Upper chest: No worrisome abnormality. Other: No worrisome neck mass identified. IMPRESSION: CT HEAD 1. Streak artifact from occipital hardware. Taking this limitation into account, no intracranial hemorrhage or CT evidence of large acute infarct noted. 2. Chronic microvascular changes. Global atrophy. CT CERVICAL SPINE 1. Prior surgical fusion occiput to posterior aspect C4. 2. Confluent anterior osteophyte and fusion of portions of occiput to C5. Confluent anterior osteophyte with fusion C7-T1. 3. Previously noted fracture involving the C6 vertebra has progressed. Any motion of cervical spine is occurring at this level as patient is fused above and below this region. Fracture extends from the anterior aspect of the C6 vertebra in oblique position on the right into the inferior right C6 facet and the C6-7 facet joint and on the left into the left lateral vertebral body. Fracture of the anterior wall of the transverse foramen bilaterally. Loss of height C6 vertebra with broad fracture  line through the compressed vertebra (which is sclerotic above emboli fracture line). Fracture C6 spinous process. Surrounding soft tissue prominence may indicate associated hemorrhage/edema. Infection could not excluded in proper clinical setting. Fractures most likely unstable. These results were called by telephone at the time of interpretation on 05/08/2018 at 2:00 pm to Dr. Blane Ohara , who verbally acknowledged these results. Electronically Signed   By: Lacy Duverney M.D.   On: 05/08/2018 14:13   Ct Cervical Spine Wo Contrast  Result Date: 05/08/2018 CLINICAL DATA:  73 year old male with multiple falls since 05/06/2018. Denies loss of consciousness. Hit head but not sure where. Initial encounter. EXAM: CT HEAD WITHOUT CONTRAST CT CERVICAL SPINE WITHOUT CONTRAST TECHNIQUE: Multidetector CT imaging of the head and cervical spine was performed following the standard protocol without intravenous contrast. Multiplanar CT image reconstructions of the  cervical spine were also generated. COMPARISON:  02/14/2018 CT head and cervical spine. FINDINGS: CT HEAD FINDINGS Brain: Streak artifact from occipital hardware. Taking this limitation into account, no intracranial hemorrhage or CT evidence of large acute infarct noted. Chronic microvascular changes. Global atrophy. No intracranial mass lesion noted on this unenhanced exam. Vascular: Vascular calcifications Skull: No skull fracture Sinuses/Orbits: No acute orbital abnormality. Visualized paranasal sinuses clear. Other: Mastoid air cells and middle ear cavities clear. CT CERVICAL SPINE FINDINGS Prior fusion occiput to posterior aspect C4. Confluent anterior osteophyte and fusion of portions of occiput to C5. Confluent anterior osteophyte with fusion C7-T1. Previously noted fracture involving the C6 vertebra has progressed. Any motion of cervical spine is occurring at this level as patient is fused above and below this region. Fracture extends from the anterior  aspect of the C6 vertebra in oblique position on the right into the inferior right C6 facet and the C6-7 facet joint and on the left into the left lateral vertebral body. Fracture of the anterior wall of the transverse foramen bilaterally. Loss of height C6 vertebra with broad fracture line through the compressed vertebra (which is sclerotic above emboli fracture line). Fracture C6 spinous process. Surrounding soft tissue prominence may indicate associated hemorrhage/edema. Infection could not excluded in proper clinical setting. Fractures most likely unstable. Upper chest: No worrisome abnormality. Other: No worrisome neck mass identified. IMPRESSION: CT HEAD 1. Streak artifact from occipital hardware. Taking this limitation into account, no intracranial hemorrhage or CT evidence of large acute infarct noted. 2. Chronic microvascular changes. Global atrophy. CT CERVICAL SPINE 1. Prior surgical fusion occiput to posterior aspect C4. 2. Confluent anterior osteophyte and fusion of portions of occiput to C5. Confluent anterior osteophyte with fusion C7-T1. 3. Previously noted fracture involving the C6 vertebra has progressed. Any motion of cervical spine is occurring at this level as patient is fused above and below this region. Fracture extends from the anterior aspect of the C6 vertebra in oblique position on the right into the inferior right C6 facet and the C6-7 facet joint and on the left into the left lateral vertebral body. Fracture of the anterior wall of the transverse foramen bilaterally. Loss of height C6 vertebra with broad fracture line through the compressed vertebra (which is sclerotic above emboli fracture line). Fracture C6 spinous process. Surrounding soft tissue prominence may indicate associated hemorrhage/edema. Infection could not excluded in proper clinical setting. Fractures most likely unstable. These results were called by telephone at the time of interpretation on 05/08/2018 at 2:00 pm to Dr.  Blane Ohara , who verbally acknowledged these results. Electronically Signed   By: Lacy Duverney M.D.   On: 05/08/2018 14:13        Scheduled Meds: . amLODipine  10 mg Oral Daily  . folic acid  1 mg Oral Daily  . furosemide  40 mg Oral Daily  . losartan  100 mg Oral QPM  . morphine  30 mg Oral Q8H  . multivitamin with minerals  1 tablet Oral Daily  . sertraline  100 mg Oral Daily  . thiamine  100 mg Oral Daily   Or  . thiamine  100 mg Intravenous Daily   Continuous Infusions:   LOS: 1 day    Time spent: 35    Delaine Lame, MD [facility]  If 7PM-7AM, please contact night-coverage [email or web address] Password [email or web address] 05/10/2018, 12:12 PM

## 2018-05-11 LAB — GLUCOSE, CAPILLARY: Glucose-Capillary: 89 mg/dL (ref 70–99)

## 2018-05-11 NOTE — Care Management Important Message (Signed)
Important Message  Patient Details  Name: SAID RUEB MRN: [id number] Date of Birth: [birth year]   Medicare Important Message Given:  Yes    Malcolm Metro, RN 05/11/2018, 4:15 PM

## 2018-05-11 NOTE — Progress Notes (Signed)
Patient has not had the urgency to urinate since removal of foley catheter. Rescanned with bladder scanner, bladder scanner showed 150-253ml of urine. MD made aware no new orders at this time. Will continue to monitor throughout shift.

## 2018-05-11 NOTE — Progress Notes (Signed)
Pt had foley removed at 1800. Patient has not voided. bladder scanned patient at 2300, bladder scan showed 0ml . rescanned patients bladder at 0100, bladder scan showed greater than . MD made aware. Will continue to monitor throughout shift.

## 2018-05-11 NOTE — Progress Notes (Signed)
PROGRESS NOTE    DERAL SCHELLENBERG  UJW:[id number] DOB: [birth year] DOA: 05/08/2018 PCP: Vivien Presto, MD  Brief Narrative:  72 year old with past medical history relevant for enclosing spondylitis, obstructive sleep apnea, hypertension, dementia, alcohol abuse admitted with recurrent falls, unstable C6 fracture and altered mental status likely secondary to alcohol withdrawal pending placement.   Assessment & Plan:   Active Problems:   Chronic pain syndrome   Essential hypertension   Ankylosing spondylitis (HCC)   Multiple falls   Acute encephalopathy   #) Alcohol abuse/withdrawal/altered mental status: His altered mental status is resolved.  Suspect most likely due to alcohol withdrawal.  His last drink was on 05/05/2018 and since then patient has been falling and noted to be altered. -Continue CIWA protocol -Continue thiamine and folate supplementation  #) Unstable C6 fracture: This was in the setting of recurrent falls.  Neurosurgery Dr. Danielle Dess has signed off and patient will see him as an outpatient in the clinic. -Continue cervical collar -Pending placement  #) Chronic pain: - Continue MS Contin 30 mill grams 3 times daily -Continue pregabalin 150 mg 3 times daily  #) Hypertension/lower extremity edema: -Continue furosemide 40 mg daily -Continue amlodipine 10 mg daily -Continue losartan 100 mg nightly  #) Pain/psych: -Continue above pain medications -Continue sertraline 100 mg daily  Fluids: Tolerating p.o. Elect lites: Monitor and supplement Nutrition: Heart healthy diet   Prophylaxis: Enoxaparin  Disposition: Pending skilled nursing facility placement  Full code     Consultants:   [facility], Dr. Danielle Dess  Procedures:   none  Antimicrobials:  none    Subjective: Patient reports he is doing fairly well this morning.  He does not have any pain anywhere.  He denies any nausea, vomiting, diarrhea, cough, congestion.  Objective: Vitals:   05/10/18 1453 05/10/18 1800 05/10/18 1926 05/11/18 0512  BP: 128/68 (!) 149/73  136/70  Pulse: 74 70  66  Resp: 18 20  19   Temp: 98.3 F (36.8 C)   98 F (36.7 C)  TempSrc: Oral   Oral  SpO2: 98% 94% 93% 98%  Weight:      Height:        Intake/Output Summary (Last 24 hours) at 05/11/2018 1049 Last data filed at 05/11/2018 0900 Gross per 24 hour  Intake 480 ml  Output 1000 ml  Net -520 ml   Filed Weights   05/08/18 1044  Weight: 108.9 kg    Examination:  General exam: Appears calm and comfortable  Respiratory system: Clear to auscultation. Respiratory effort normal. Cardiovascular system: Regular rate and rhythm, no murmurs. Gastrointestinal system: Abdomen is nondistended, soft and nontender. No organomegaly or masses felt. Normal bowel sounds heard. Central nervous system: Alert and oriented.  Is intact, moving all extremities, some midline tenderness, cervical collar in place Extremities: Trace lower extremity edema Skin: Contusions noted Psychiatry: Judgement and insight appear normal. Mood & affect appropriate.     Data Reviewed: I have personally reviewed following labs and imaging studies  CBC: Recent Labs  Lab 05/08/18 1201 05/08/18 1748 05/10/18 0524  WBC 9.4  --  6.7  NEUTROABS 7.5  --   --   HGB 11.9*  --  12.8*  HCT 36.0* 32.5* 38.6*  MCV 92.1  --  92.8  PLT 170  --  182   Basic Metabolic Panel: Recent Labs  Lab 05/08/18 1201 05/09/18 0504 05/10/18 0524  NA 133* 136 138  K 4.3 4.1 3.7  CL 99 102 103  CO2 24 25 26  GLUCOSE 115* 93 105*  BUN 25* 25* 22  CREATININE 1.56* 1.28* 1.19  CALCIUM 8.7* 8.7* 9.2   GFR: Estimated Creatinine Clearance: 73.6 mL/min (by C-G formula based on SCr of 1.19 mg/dL). Liver Function Tests: Recent Labs  Lab 05/08/18 1201  AST 25  ALT 19  ALKPHOS 92  BILITOT 1.3*  PROT 7.7  ALBUMIN 3.8   No results for input(s): LIPASE, AMYLASE in the last 168 hours. Recent Labs  Lab 05/08/18 1201  AMMONIA 22    Coagulation Profile: No results for input(s): INR, PROTIME in the last 168 hours. Cardiac Enzymes: Recent Labs  Lab 05/08/18 1201  CKTOTAL 154   BNP (last 3 results) No results for input(s): PROBNP in the last 8760 hours. HbA1C: No results for input(s): HGBA1C in the last 72 hours. CBG: Recent Labs  Lab 05/08/18 1209 05/11/18 0518  GLUCAP 104* 89   Lipid Profile: No results for input(s): CHOL, HDL, LDLCALC, TRIG, CHOLHDL, LDLDIRECT in the last 72 hours. Thyroid Function Tests: Recent Labs    05/08/18 1202  TSH 2.250   Anemia Panel: Recent Labs    05/08/18 1748  VITAMINB12 271   Sepsis Labs: No results for input(s): PROCALCITON, LATICACIDVEN in the last 168 hours.  Recent Results (from the past 240 hour(s))  Culture, blood (routine x 2)     Status: None (Preliminary result)   Collection Time: 05/08/18 12:02 PM  Result Value Ref Range Status   Specimen Description BLOOD SITE NOT SPECIFIED DRAWN BY RN  Final   Special Requests   Final    BOTTLES DRAWN AEROBIC AND ANAEROBIC Blood Culture adequate volume   Culture   Final    NO GROWTH 3 DAYS Performed at [facility], [address] [place], [place] [address]    Report Status PENDING  Incomplete  Culture, blood (routine x 2)     Status: None (Preliminary result)   Collection Time: 05/08/18 12:49 PM  Result Value Ref Range Status   Specimen Description BLOOD RIGHT HAND  Final   Special Requests   Final    BOTTLES DRAWN AEROBIC AND ANAEROBIC Blood Culture adequate volume   Culture   Final    NO GROWTH 3 DAYS Performed at [facility], [address], [place], [place] [address]    Report Status PENDING  Incomplete         Radiology Studies: No results found.      Scheduled Meds: . amLODipine  10 mg Oral Daily  . enoxaparin (LOVENOX) injection  40 mg Subcutaneous Daily  . folic acid  1 mg Oral Daily  . furosemide  40 mg Oral Daily  . losartan  100 mg Oral QPM  . morphine  30 mg Oral Q8H  .  multivitamin with minerals  1 tablet Oral Daily  . pregabalin  150 mg Oral TID  . sertraline  100 mg Oral Daily  . thiamine  100 mg Oral Daily   Or  . thiamine  100 mg Intravenous Daily   Continuous Infusions:   LOS: 2 days    Time spent: 35    Delaine Lame, MD [facility]  If 7PM-7AM, please contact night-coverage [email or web address] Password [email or web address] 05/11/2018, 10:49 AM

## 2018-05-11 NOTE — Progress Notes (Signed)
Bladder scan performed and reading 200 cc of urine in the bladder. Patients abdomen is distended. Patient denies any discomfort at this time.

## 2018-05-11 NOTE — Progress Notes (Signed)
Patient with no output for shift so far.  Bladder scanner shows less than 100 mL.  MD aware and ordered in/out cath.  In/out cath done and produced <MEASURELower Bucks HospitalNT>Rock Edwin Shaw FarrelAlfonse RFayrene F20eaCMercy Rehabilitation Hospital SpringfieldGSheltering Arms Hospital SouthaDarrelyn HiR6Northwest Kansas Surgery CenterE453060RadE1610BaK[id number] Lake Medical Center-Downtown Campu7sRenne South Placer Surgery Center LPMarland KitchenrMarland KitchenE40Wolf39gang PAdventist Healthcare BehavioralPrisma Health Oconee Memorial Hospital16 [place] Ether G94 HanovKentucCharletMEli Lilly andRad8A98PerryviManceMar4098Sue Optic9811914Cristy Durene45713-3RocktonRichwo161Cliffton AsNedra HaitEdRa(3North Riv<MEASUREMEBrandywine Valley Endoscopy Center>SGreat Lakes Surgical Suites LLC Dba GreFarrelAlfonse RFayrene F66eOceans Behavioral Hospital Of The Permian BasinLone Star Endoscopy Center LLCVDarrelyn HiR6Vibra Hospital Of Central DakotasE4534RadE1610BaK[phone] Spine & Specialty Hospita91lRenne Loma Linda University Heart And Surgical HospitalMarland KitchenrMarland KitchenE40Wolf21gang PUs Air Force HospitalRoyal Oaks Hospital76-[place] Ether G9107 Old RivKentucCharletMEli Lilly andRad44A98BucyManceMar4098Sue Optic9811914Cristy Durene108515 0GarlandGert161Cliffton AsNedra HaitEd<MEASUREMESsm St. Joseph Hospital WestFarrelAlfonse RFayrene F22Guttenberg Municipal HospitalBrownsville Doctors HospitalLDarrelyn HiR6Mountain Lakes Medical CenterE45256RadE1610BaK[id number] S Hershey Medical Cente53rRenne Freeway Surgery Center LLC Dba Legacy Surgery CenterMarland KitchenrMarland KitchenE40Wolf53gang PCarolinas Healthcare SysThe Surgery Center At Northbay Vaca Valley53tSmithfield Ether G991 EvergKentucCharletMEli Lilly andRad1A98DoManceMar4098Sue Optic9811914Cristy Durene29(907) 3LangdonPark161Cliffton AsNedra HaitEdR<MEASUREMEEmory Univ Hospital- Emory Univ Ortho>CoCloSacred HFarrelAlfonse RFayrene F4Baldwin Area Med CtrFaGilliam Psychiatric HospitaliDarrelyn HiR6Northwest Eye SpecialistsLLCE456751RadE1610BaKentuckSCharle45sGe49703([id number] New York Eye Center Lt70dRenne Banner Sun City West Surgery Center LLCMarland KitchenrMarland KitchenE40Wolf27gang PIrHinsdale Surgical Center37oSmithfield Ether G99207 West AlderwoKentCharletMEli Lilly andRad71A98EdmondManceMar4098Sue Optic9811914Cristy Durene30903-4MosqueroAdri161Cliffton AsNedra HaitE<MEASUREMEBaldpate Hospital>7WEndoscopy Center Of East Jordan DFarrelAlfonse RFayrene F49eaHasbroucProvidence Surgery Centers LLCSt. SSurgcenter Of St LucietDarrelyn HiR6New England Eye Surgical Center IncE45(5124RadE1610BaK[id number] Hospita69lRenne Community Care HospitalMarland KitchenrMarland KitchenE40Wolf54gang PGirHealdsburg District Hospital109aSmithfield Ether G95 South BricKentucCharletMEli Lilly andRad48A98PeabManceMar4098Sue Optic9811914Cristy Durene18458-3SyosseB161Cliffton AsNedra HaitEdR<MEASUREMEGrandview Hospital & Medical CenterDoctors SamFarrelAlfonse RFayrene FMesa View Regional HospitalMercy WestbrookBDarrelyn HiR6Bellin Health Oconto HospitalE457368RadE1610BaKentuckSCharle107sGe34(236)[phone] Endoscopy Cente61rRenne New York Methodist HospitalMarland KitchenrMarland KitchenE40Wolf44gang PBlack Canyon SS. E. Lackey Critical Access Hospital & Swingbed57uSmithfield Ether G97879 KentuckyCharletMEli Lilly andRad74A98West BuManceMar4098Sue Optic9811914Cristy Durene37914-5ConynghamHollanda161Cliffton AsNedra HaitEd<MEASUREMEKindred Hospital The HeighFarrelAlfonse RFayrene F27eaWAdobe Surgery Center PcRivMemorial Hermann Surgery Center Kingsland LLCeDarrelyn HiR6Clinch Valley Medical CenterE4589RadE1610BaKentuckSCharle78sGe54418-9817253413243540Davita Medical Colorado Asc LLC Dba Digestive Disease Endoscopy Cente82rRenne Three Rivers HealthMarland KitchenrMarland KitchenE40Wolf58gang PBrookWellbridge Hospital Of San Marcos78iSmithfield Ether G9601 NE. WinKentucCharletMEli Lilly andRad79A98MoncManceMar4098Sue Optic9811914Cristy Durene13863-0TruesdalFort Ha161Cliffton AsNedra HaitEdRNew <MEASUREMEGlobal Microsurgical Center LLC>tiArnoKindred HospFarrelAlfonse RFayrene F70eaAmaThe Vancouver Clinic IncChippenham Ambulatory Surgery Center LLCMDarrelyn HiR6Erie Va Medical CenterE45(50938RadE1610BaK[phone] Medical Campu92sRenne Lehigh Valley Hospital-MuhlenbergMarland KitchenrMarland KitchenE40Wolf51gang PJewish HoBrandywine Valley Endoscopy Center50sSmithfield Ether G977 North PKentucCharletMEli Lilly andRad24A98HebManceMar4098Sue Optic9811914Cristy Durene45972BordelonvillReser161Cliffton AsNedra HaitEdR<MEASUREMEEastern Maine Medical CentHouma-Amg Specialty HospitaSweeIsland Ambulatory Surgery Center9817675Re45nCentral Coast Endoscopy Center IncSmithfield970Graciela HusbanOpti<MEASUREMECook HospiFarrelAlfonse RFayrene F4Gastroenterology Consultants Of San Antonio NeMaplWickenburg Community HospitaleDarrelyn HiR6Englewood Hospital And Medical CenterE45858RadE1610BaK[id number] Health Rehabilitation Hospital Of Yor17kRenne Seattle Va Medical Center (Va Puget Sound [facility] System)Marland KitchenrMarland KitchenE40Wolf25gang PFour CountyStaten Island Univ Hosp-Concord Div59 [place] Ether G9805 TayKentuCharletMEli Lilly andRad48A98McCartys VillManceMar4098Sue Optic9811914Cristy Durene40(548)1West FairviewSome161Cliffton AsNedra HaitEdRa(<MEASUREMESurgery Center Of Fairbanks LLC>enHome OrangeFarrelAlfonse RFayrene Spartan Health Surgicenter LLCShellEating Recovery Center A Behavioral Hospital Darrelyn HiR6Mackinaw Surgery Center LLCE45(5030RadE1610[id number] Valley Community Hospita58lRenne Jackson Park HospitalMarland KitchenrMarland KitchenE40Wolf74gang PVirginia CentStarr Regional Medical Center Etowah1eSmithfield Ether G977 OverloKenCharletMEli Lilly andRad27A98TraManceMar4098Sue Optic9811914Cristy Durene60(709)7GenoaMaywood Pa161Cliffton AsNedra HaitEdRa(6<MEASUREMEMemorial Hermann Texas International Endoscopy Center Dba Texas International EndoscopyFarrelAlfonse RFayrene F69eSouthwest Health Care Geropsych UnitMeadows Regional Medical CenterNDarrelyn HiR6St Margarets HospitalE452RadE1610BaKentuckSCharle36sGe83(239)[phone] Health Surgery Cente69rRenne Central Ohio Endoscopy Center LLCMarland KitchenrMarland KitchenE40Wolf36gang PBeltway Surgery Centers LLC Dba Eagle HighlaUnited Medical Rehabilitation Hospital52nSmithfield Ether G98626 MarKentucCharletMEli Lilly andRad26A98AtlaManceMar4098Sue Optic9811914Cristy Durene74(657)1WiltonRal161Cliffton AsNedra HaitEdR<MEASUREMECrowne Point Endoscopy And Surgery Center>heNeBaylor Scott & White FarrelAlfonse RFayrene F37eaCentral Louisiana Surgical HospitalIndianCanyon Ridge Hospital Darrelyn HiR6Snowden River Surgery Center LLCE453166RadE1610BaK[id number] Vancouver Clinic In53cRenne Washington Regional Medical CenterMarland KitchenrMarland KitchenE40Wolf70gang PSpokane Ear Nose AnSutter Roseville Endoscopy Center52dSmithfield Ether G99063 KentucCharletMEli Lilly andRad68A98FManceMar4098Sue Optic9811914Cristy Durene27(573) 2GrampianHarv161Cliffton AsNedra HaitEdRa7South L<MEASUREMEHealthsouth Rehabilitation Hospital Of Modesto> AnmFarrelAlfonse RFayrene F71eaWebbChildren'S Medical Center Of DallasSt. MartiHima San Pablo - FajardonDarrelyn HiR6Memorial Hermann Tomball HospitalE4537RadE1610BaKentuckSCharle53sGe59(780) [phone] Health Ventures LLC Dba Marin Specialty Surgery Cente45rRenne Meridian Surgery Center LLCMarland KitchenrMarland KitchenE40Wolf43gang PMilton S HersSilver Lake Medical Center-Ingleside Campus39hSmithfield Ether G98425 IllinKentuCharletMEli Lilly andRad65A98BogManceMar4098Sue Optic9811914Cristy Durene66817-2Sylvan BeachHuds161Cliffton AsNedra HaitEdRa8Breckenri<MEASUREMEEncompass Health Reading Rehabilitation Hospital> HCamerSuFarrelAlfonse RFayrene F5Grady Memorial HospitaCoffee Regional Medical CenterlDarrelyn HiR6North Florida Regional Medical CenterE455567RadE1610BaKentuckSCharle64sGe8(412) [phone] Oaks Hospita26lRenne Miami Orthopedics Sports Medicine Institute Surgery CenterMarland KitchenrMarland KitchenE40Wolf62gang PMd SurgHamlin Memorial Hospital49iSmithfield Ether G9315 BKentuckCharletMEli Lilly andRad12A98AquiManceMar4098Sue Optic9811914Cristy Durene51502-3KappaBigel161Cliffton AsNedra HaitEdR<MEASUREMENovant Health Southpark Surgery Center>BaMathGrove FarrelAlfonse RFayrene F2Yoakum Community HospitalSanford Medical Center FargoLDarrelyn HiR6Vibra Hospital Of Southwestern MassachusettsE45885RadE1610BaKentu[id number] Pleasant Surgery Ct23rRenne Whitehall Surgery CenterMarland KitchenrMarland KitchenE40Wolf64gang PPark Pl Woodlawn Hospital39SSmithfield Ether G97315 SKentucCharletMEli Lilly andRad104A98TyrManceMar4098Sue Optic9811914Cristy Durene60434-4BlanchNiagara Universi161Cliffton AsNedra HaitEdRa<MEASUREMESelect Specialty Hospital>VaEast ClEFarrelAlfonse RFayrene F73eaWaHershey Outpatient Surgery Center LPBlack River Mem HsptlNDarrelyn HiR6Grand View HospitalE455756RadE1610BaK[phone] Hopkins Sc67sRenne Bayview Surgery CenterMarland KitchenrMarland KitchenE40Wolf75gang PBeverly Hospital AddiElgin Gastroenterology Endoscopy Center LLC66sSmithfield Ether G9299 South BeKentuCharletMEli Lilly andRad67A98GreenfiManceMar4098Sue Optic9811914Cristy Durene21(639)4ThompsonsMelve161Cliffton AsNedra HaitEdRa8Grand<MEASUREMENovamed Surgery Center Of Cleveland LLC>dSurgery CenteFarrelAlfonse RFayrene F10Piedmont Mountainside HospitalMcClCoulee Medical CentereDarrelyn HiR6Utmb Angleton-Danbury Medical CenterE459021RadE1610BaKentu[phone] State Hospita60lRenne Norwalk HospitalMarland KitchenrMarland KitchenE40Wolf51gang PHigh Point RegiSan Carlos Ambulatory Surgery Center19oSmithfield Ether G9299 South BeKentucCharletMEli Lilly andRad42A98PotManceMar4098Sue Optic9811914Cristy Durene48(450)2New ViennaGardn161Cliffton AsNedra HaitEdRa(5Br<MEASUREMEHinsdale Surgical Center>svReid HospitaFarrelAlfonse RFayrene FProgressive Laser Surgical Institute LtdRSt Charles PrinevilleeDarrelyn HiR6Marshall Medical CenterE452070RadE1610BaKentu[id number] Of Pasadena Hospita68lRenne Banner Health Mountain Vista Surgery CenterMarland KitchenrMarland KitchenE40Wolf48gang PSt. BernaCascades Endoscopy Center LLC74rSmithfield Ether G9685 Plumb BrKentucCharletMEli Lilly andRad52A98WhitetManceMar4098Sue Optic9811914Cristy Durene40305-2Nesika BeachBog161Cliffton AsNedra HaitEdR<MEASUREMESurgery Center Of South [place]>osHRedFarrelAlfonse RFayrene F74eaHealtheast Bethesda HospitalPatton VUniversity Of Kansas HospitaliDarrelyn HiR6St Davids Surgical Hospital A Campus Of North Austin Medical CtrE454253RadE1610BaKentu[phone] Fayette Surgery Center LL1CRenne Ascension River District HospitalMarland KitchenrMarland KitchenE40Wolf29gangHaymarket Medical Center31 [place] Ether G97237 DivisiKentuckGCharletMEli Lilly andRad13A98Natural BriManceMar4098Sue Optic9811914Cristy Durene63(825)2Port ArthurMillersbu161Cliffton AsNedra HaitEdRa(3Shan<MEASUREMEGastrointestinal Endoscopy Associates LLC> HMaplUniversity Of Mississippi FarrelAlfonse RFayrene Community Mental Health Center InSwedish Medical Center - EdmondscDarrelyn HiR6Mid-Valley HospitalE45(93050RadE1610BaKen[id number] Eye Center In56cRenne Va Amarillo Healthcare SystemMarland KitchenrMarland KitchenE40Wolf59gang PEncompass Rehabilitation Glen Ridge Surgi Center62HSmithfield Ether G99813 RandallKentucCharletMEli Lilly andRad23A98Archer CManceMar4098Sue Optic9811914Cristy Durene6205-0PolktonJerseyto161Cliffton AsNedra HaitEdRa5G<MEASUREMEAmbulatory Surgery Center Of Spartanburg>HaECaFarrelAlfonse RFayrene FMobridge Regional Hospital And ClinicGaylord HospitalRDarrelyn HiR6Northern Arizona Va Healthcare SystemE45(9023RadE1610BaKentuckSCharle42sGe51769817253413243640Zazen Surgery Center LL14CRenne Butler County Health Care CenterMarland KitchenrMarland KitchenE40Wolf68gang PLewisgalHalifax Psychiatric Center-North65eSmithfield Ether G9139 KentucCharletMEli Lilly andRad71A98CheManceMar4098Sue Optic9811914Cristy Durene97612-2ManchesterPeleti161Cliffton AsNedra HaitEdRa(36Tu<MEASUREMEFairlawn Rehabilitation Hospital>tuHiNorthfFarrelAlfonse RFayrene F39eaSLowcountry Outpatient Surgery Center LLMethodist Hospital Of Southern CaliforniaTDarrelyn HiR6Adena Regional Medical CenterE45(864RadE1610BaKentuckSCharle17sGe20(515) 1([phone] Specialty Hospita59lRenne Jackson Purchase Medical CenterMarland KitchenrMarland KitchenE40Wolf58gang PMemorial HoOptim Medical Center Tattnall72sSmithfield Ether G9882 East 8KentucCharletMEli Lilly andRad79A98GarManceMar4098Sue Optic9811914Cristy Durene56(609) 3BurkDickson Ci161Cliffton AsNedra HaitEdRa(7Ma<MEASUREMEArizona Digestive Center>nMunson HealFarrelAlfonse RFayrene FPrincess Anne Ambulatory Surgery Management LLO'Connor HospitalPDarrelyn HiR6Raleigh General HospitalE4525RadE1610BaKentuckSCharle75sGe24(410)[phone] Health Rehabilitation Hospital Of [place], LL40CRenne Cornerstone Hospital Of Southwest LouisianaMarland KitchenrMarland KitchenE40Wolf75gang PKindred Upstate Surgery Center LLC87HSmithfield Ether G9824 CirKentuckGCharletMEli Lilly andRad47A98YetManceMar4098Sue Optic9811914Cristy Durene41365-5CottondalEastvi161Cliffton AsNedra HaitEdRa6Havre<MEASUREMESelect Specialty Hospital Of Ks City> GWoodbury The EnFarrelAlfonse RFayrene F49Erlanger BledsoeOtis Orchards-EastTexas Health Surgery Center Irving Darrelyn HiR6Franciscan Surgery Center LLCE45(66721RadE1610BaKentu[id number] Aurora Kaukauna Surgery Cente73rRenne Kaiser Foundation HospitalMarland KitchenrMarland KitchenE40Wolf82gang PThe Medical Center Of Southeast TexMercy Health -Love County52aSmithfield Ether G98618 HigKentucCharletMEli Lilly andRad50A98Silver SpriManceMar4098Sue Optic9811914Cristy Durene31(332) 2WhitehorsDanbu161Cliffton AsNedra HaitEdRa(56W<MEASUREMECalhoun-Liberty Hospital>esOrcharNorth Florida Gi Center Dba North FarrelAlfonse RFayrene F7St Marks Ambulatory Surgery Associates LPWiSouth Alabama Outpatient ServiceslDarrelyn HiR6Advanced Eye Surgery Center PaE452132RadE1610BaKentuckSCharle57sGe76(570) [phone]rology Endoscopy Cente14rRenne Erlanger Murphy Medical CenterMarland KitchenrMarland KitchenE40Wolf29gang PWilkes-BarrReception And Medical Center Hospital64eSmithfield Ether G967 LancastKentucCharletMEli Lilly andRad48A98AlbManceMar4098Sue Optic9811914Cristy Durene44(959)3Salt Creek CommonsHa161Cliffton AsNedra HaitEdRa(<MEASUREMENorth Chicago Va Medical Center>mmInland Endoscopy Center Inc Dba MounFarrelAlfonse RFayrene F36eaWilCollege Heights Endoscopy Center LLCRoyBaylor Scott And White Surgicare DentonaDarrelyn HiR6Elite Surgical Center LLCE45(2171RadE1610BaKentuckSCharle19sGe48(223)[phone] Rehabilitation Hospital For Childre23nRenne Kindred Hospital Pittsburgh North ShoreMarland KitchenrMarland KitchenE40Wolf79gang PNorth Kitsap Ambulatory Pacific Surgery Ctr64SSmithfield Ether G9351 OrchKentucCharletMEli Lilly andRad90A98Eden PraiManceMar4098Sue Optic9811914Cristy Durene86540DaltonShawneela161Cliffton AsNedra HaitEdRa6L<MEASUREMECoteau Des Prairies Hospital> RHaydDartmouth Hitchcock AFarrelAlfonse RFayrene F26Spooner Hospital SystemClRestpadd Psychiatric Health FacilityoDarrelyn HiR6Mesquite Surgery Center LLCE454028RadE1610BaK[id number] Springs Specialty Hospita34lRenne Upstate Gastroenterology LLCMarland KitchenrMarland KitchenE40Wolf80gang PEncompass Health Rehab HospiMcgehee-Desha County Hospital63tSmithfield Ether G9601 NE. WinKentucCharletMEli Lilly andRad20A98WManceMar4098Sue Optic9811914Cristy Durene33614-0NeelyvillHawthor161Cliffton AsNedra HaitEd<MEASUREMESage Specialty Hospital>CCare OnFarrelAlfonse RFayrene F75eaBaptist Health FloydPleaCgh Medical CentersDarrelyn HiR6Milwaukee Cty Behavioral Hlth DivE454383RadE1610BaKentuckSCharle5sGe50([id number] Fnd Hosp - South San Francisc64oRenne Kaiser Fnd Hosp - RiversideMarland KitchenrMarland KitchenE40Wolf43gang PAdventist Midwest Health Dba AdventistNortheastern Vermont Regional Hospital58 [place] Ether G9650 KentCharletMEli Lilly andRad89A98WingManceMar4098Sue Optic9811914Cristy Durene78(201) 2Milford SquarMag161Cliffton AsNedra HaitEdRa9Kra<MEASUREMEGrove City Medical Center>rvElAdvocateFarrelAlfonse RFayrene F41eaSt. AugustiLowell General HospitalCastle Pines VCentral State HospitaliDarrelyn HiR6Bronx-Lebanon Hospital Center - Concourse DivisionE453872RadE1610BaKentuckSCharle24sGe26773 [id number] Lukes [facility] Baylor College Of Medicine Medical Cente46rRenne Mercy Hospital St. LouisMarland KitchenrMarland KitchenE40Wolf65gang PBanner Union HiMethodist Jennie Edmundson32lSmithfield Ether G97315 ParisKentucCharletMEli Lilly andRad49A98MiddletManceMar4098Sue Optic9811914Cristy Durene21905WoodridgRutled161Cliffton AsNedra HaitEdRa(<MEASUREMEWestern State Hospital>anPLittlFarrelAlfonse RFayrene F57eaMaNaperville Psychiatric Ventures - Dba Linden Oaks HospitalFruLohman Endoscopy Center LLCiDarrelyn HiR6Harrison Medical Center - SilverdaleE451RadE1610BaKentuckSCharle41sGe41479817253413244240Santa Cruz Valley Hospita62lRenne Providence Newberg Medical CenterMarland KitchenrMarland KitchenE40Wolf33gang PSedgwick CountyBaptist Health Endoscopy Center At Miami Beach68 [place] Ether G92C SE. AKentCharletMEli Lilly andRad21A98San SimManceMar4098Sue Optic9811914Cristy Durene63510-6BlevinsSavonbu161Cliffton AsNedra HaitEdRa5<MEASUREMELexington Regional Health Center>uSt Lukes FarrelAlfonse RFayrene F65eaTWops IncWestEvanston Regional HospitalpDarrelyn HiR6Circles Of CareE453835RadE1610BaKentuckSCharle88sGe51(409)[phone] Medical Center - Ballard Campu56sRenne Western Plains Medical ComplexMarland KitchenrMarland KitchenE40Wolf29gang PChildren'S Hospital Colorado At Parker Southern New Hampshire Medical Center66ASmithfield Ether G911 AiKentucCharletMEli Lilly andRad59A98Goofy RiManceMar4098Sue Optic9811914Cristy Durene35561-1GallinaBrodhe161Cliffton AsNedra HaitEdRDalto<MEASUREMETimberlake Surgery Center>arValleWFarrelAlfonse RFayrene F61eaLemmGeneva Woods Surgical Center IncSantVirtua West Jersey Hospital - CamdenaDarrelyn HiR6St. John Broken ArrowE45(4355RadE1610BaKentuckSCharle64sGe47(380)[phone] Endoscopy Pavilio81nRenne Columbus Com HsptlMarland KitchenrMarland KitchenE40Wolf72gang PGastroenterology Associates Compass Behavioral Center70OSmithfield Ether G9718 TunKentucCharletMEli Lilly andRad22A98WiManceMar4098Sue Optic9811914Cristy Durene74(508) 1IvanhoStottvil161Cliffton AsNedra HaitEd<MEASUREMEKona Community Hospital>8SLakeStFarrelAlfonse RFayrene F48eaColuKansas City Va Medical CentSanford Health Detroit Lakes Same Day Surgery CtreDarrelyn HiR6The Center For Gastrointestinal Health At Health Park LLCE45(22012RadE1610BaKentuckSCharle75sGe65315-9817253413247340Swedish Medical Center - Issaquah Camp20uRenne Platte Valley Medical CenterMarland KitchenrMarland KitchenE40Wolf44gang PAshlArchibald Surgery Center LLC12aSmithfield Ether G98014 PKentCharletMEli Lilly andRad41A98DanfoManceMar4098Sue Optic9811914Cristy Durene33959-3TrentonShar161Cliffton AsNedra HaitEdRa5Germ<MEASUREMEBaylor Emergency Medical Center>VOxfoFarrelAlfonse RFayrene F54eaBuIntracoastal Surgery Center LLCUSan Francisco Endoscopy Center LLCaDarrelyn HiR6Sovah Health DanvilleE45723RadE1610BaKentuckSCharle47sGe69329817253413247740Gailey Eye Surgery Decat48uRenne Burlingame Health Care Center D/P SnfMarland KitchenrMarland KitchenE40Wolf70gang PWnc Eye SMedstar Franklin Square Medical Center75uSmithfield Ether G9339 E. GoldfiKentuckCharletMEli Lilly andRad34A98KingsManceMar4098Sue Optic9811914Cristy Durene11217The VillagOn161Cliffton AsNedra HaitEd<MEASUREMESt. Landry Extended Care Hospital>OrFalmouth FSuFarrelAlfonse RFayrene F40eaEagle CrePenn Medical Princeton MedicalPalMclaren Thumb RegionmDarrelyn HiR6Fieldstone CenterE4533RadE1610BaKent[id number] Macom53bRenne Duke Health Warm Springs HospitalMarland KitchenrMarland KitchenE40Wolf63gang PSelect Specialty Hospital-NOrchard Surgical Center LLC62oSmithfield Ether G98398 W. KentuckCharletMEli Lilly andRad92A98ChaManceMar4098Sue Optic9811914Cristy Durene4(937)8Mount PleasanChurubus161Cliffton AsNedra HaitEdRa3<MEASUREMEBend Surgery Center LLC Dba Bend Surgery Center>Fort WalFarrelAlfonse RFayrene F81eaSieCoffey County Hospital LtcuPrince'sDecatur County General Hospital Darrelyn HiR6Medical City DentonE45(3385RadE1610BaKentuckSCharle82sGe75659817253413247440Bay Area Regional Medical Cente59rRenne Gibson General HospitalMarland KitchenrMarland KitchenE40Wolf28gang PResurrectGlbesc LLC Dba Memorialcare Outpatient Surgical Center Long Beach23iSmithfield Ether G97312 ShKentuckCharletMEli Lilly andRad65A98Big Foot PraiManceMar4098Sue Optic9811914Cristy Durene45(253)2OsagMoo161Cliffton AsNedra HaitEdRa<MEASUREMEDigestive Health Center Of Thousand Oaks>SeMeadowviewFarrelAlfonse RFayrene F51eaLamBayfront Ambulatory Surgical Center LLCFort Madison HospitalDarrelyn HiR6Physicians Surgery Center At Glendale Adventist LLCE456740RadE1610BaKentuckSCharle8sGe41(707)[phone] Paulding Hospita43lRenne Rehab Center At RenaissanceMarland KitchenrMarland KitchenE40Wolf9gang PSansum Clinic Dba Foothill Surgery CenteEllinwood District Hospital85rSmithfield Ether G9967 MeadowKentuckCharletMEli Lilly andRad30A98Michigan CManceMar4098Sue Optic9811914Cristy Durene29217 0Lake PocotopaugMcGove161Cliffton AsNedra HaitEdRa6OcklaFrenLakeview BehFayrene FearinBaKentuck51yV639-Barlow Respiratory Hospital10MErieSpark M. Matsunaga VaMEli Lilly and Com98Kiron1Mar409AlCristy HiSebastopolJonestow<MEASUREMESt Anthony Hospital>dfWhiMenloFarrelAlfonse RFayrene F36eaFlat TopNorth Mississippi Medical Center West PointRockyEfthemios Raphtis Md Pc Darrelyn HiR6Ascension Brighton Center For RecoveryE45(81253RadE1610BaKentuckSCharle71sGe8431981725341324540Starr Regional Medical Cente22rRenne Norwood Endoscopy Center LLCMarland KitchenrMarland KitchenE40Wolf47gang PRankin CountyAdventhealth Conway Chapel17 [place] Ether G98726 CobblestoKentucCharletMEli Lilly andRad8A98English CrManceMar4098Sue Optic9811914Cristy Durene38(613) 3ClaremonArlingt161Cliffton AsNedra HaitEdRa9Hill<MEASUREMEMs Baptist Medical Center> LPublicFarrelAlfonse RFayrene F52eaSFairmont General HospitalOSt Charles - Madras'Darrelyn HiR6Sentara Leigh HospitalE45(40266RadE1610BaKentuckSCharle26sGe31803-3([id number] Arms Hospital Sout23hRenne Saint Mary'S Health CareMarland KitchenrMarland KitchenE40Wolf24gang PEdgemoor Tri City Surgery Center LLC60GSmithfield Ether G94 GreyKentucCharletMEli Lilly andRad76A98Sulphur RManceMar4098Sue Optic9811914Cristy Durene87805LloydsvillRoseda161Cliffton AsNedra HaitEdR<MEASUREMECleveland Clinic Rehabilitation Hospital, Edwin Shaw>lFarrelAlfonse RFayrene F30eaSRegency Hospital Of Northwest IndianAlhambra HospitalaDarrelyn HiR6Memorial Hermann Sugar LandE45819RadE1610BaKentuckSCharle42sGe10(205)[phone] Health Associates In48cRenne Ocr Loveland Surgery CenterMarland KitchenrMarland KitchenE40Wolf70gang PAmbulatory Surgery Center At Virtua [facility] LLC Dba Virtua Mission Ambulatory Surgicenter91CSmithfield Ether G9248 KentuckCharletMEli Lilly andRad5A98CrescManceMar4098Sue Optic9811914Cristy Durene11941-0West SwanzeyRiver Bott161Cliffton AsNedra HaitE<MEASUREMECarlsbad Medical Center>KFarrelAlfonse RFayrene F7Gramercy Surgery Center IncPoAbilene Center For Orthopedic And Multispecialty Surgery LLCrDarrelyn HiR6Encompass Health Lakeshore Rehabilitation HospitalE456345RadE1610BaKentuckSCharle51sGe21(754) [phone] Medical Center-Seto96nRenne Bluffton Regional Medical CenterMarland KitchenrMarland KitchenE40Wolf37gang PEwing Garden [facility]33RSmithfield Ether G9142 WayKentuckGCharletMEli Lilly andRad25A98Kemp MManceMar4098Sue Optic9811914Cristy Durene29(613)3Fountain ValleyVicke161Cliffton AsNedra Hait<MEASUREMESummitridge Center- Psychiatry & Addictive Med>ChVMouFarrelAlfonse RFayrene FTexas Health Surgery Center AllianceEggertMescalero Phs Indian HospitalsDarrelyn HiR6Cornerstone Speciality Hospital - Medical CenterE45RadE1610BaKentuckSCharle38sGe88(838)[phone] LL23CRenne Iowa Specialty Hospital - BelmondMarland KitchenrMarland KitchenE40Wolf64gang PSan Ramon RegioNix Community General Hospital Of Dilley [place]42nSmithfield Ether G99377 AlKentuCharletMEli Lilly andRad3A98GenManceMar4098Sue Optic9811914Cristy Durene67714-7StoneridgMerrillvil161Cliffton AsNedra HaitEdRa6<MEASUREMETitusville Area Hospital>thChenAscensionFarrelAlfonse RFayrene F69eaLaIncline Village Health CenterLaymPeachford HospitalaDarrelyn HiR6Physicians Surgery Center At Good Samaritan LLCE45508RadE1610BaK[id number] Medical Cente61rRenne [facility] Surgic[facility] LLCMarland KitchenrMarland KitchenE40Wolf57gang PProctor [facility]73CSmithfield Ether G99538 CoKentuckCharletMEli Lilly andRad36A98LequManceMar4098Sue Optic9811914Cristy Durene79(954) 1Park LaynRock Hi161Cliffton AsNedra HaitEdRa3Pr<MEASUREMEIndiana University Health Paoli Hospital>evGreEncompass Health RehabilitatFarrelAlfonse RFayrene F19eaMelbouEastern Oklahoma Medical CenterWinFreeman Surgical Center LLCtDarrelyn HiR6Lakeside Surgery LtdE45(9267RadE1610Ba[phone] [facility] Dalla50sRenne Baraga County Memorial HospitalMarland KitchenrMarland KitchenE40Wolf8gang PMeadAvera Marshall Reg Med Center7oSmithfield Ether G9574 PrinKentucCharletMEli Lilly andRad69A98Southwest SandhManceMar4098Sue Optic9811914Cristy Durene109(732) 6BloomingdalBernvil161Cliffton AsNedra HaitEdR<MEASUREMERiverside Hospital Of Louisiana>MFarrelAlfonse RFayrene FSouthwell Medical, A Campus Of TrmcWesternRiverside Shore Memorial Hospital Darrelyn HiR6Omaha Surgical CenterE457177RadE1610BaKentu[id number] Valley Behavioral Healt19hRenne Harrisburg Endoscopy And Surgery Center IncMarland KitchenrMarland KitchenE40Wolf66gang PCharles RThe Surgery Center71iSmithfield Ether G98037 TheKentuckCharletMEli Lilly andRad28A98Wayne LaManceMar4098Sue Optic9811914Cristy Durene70601 4Lake CarrollDenv161Cliffton AsNedra HaitEd<MEASUREMECedar-Sinai Marina Del Rey Hospital>TaRocGeisiFarrelAlfonse RFayrene F13Mount Sinai Medical CenterFaSt. Vincent Medical CenterrDarrelyn HiR6Shriners Hospitals For ChildrenE45786RadE1610BaK[id number] Surgery Center LL64CRenne Mount St. Mary'S HospitalMarland KitchenrMarland KitchenE40Wolf39gang PManchesterMedical City Weatherford23 Smith[place] Ether G98893 FaiKentuckCharletMEli Lilly andRad18A98JunManceMar4098Sue Optic9811914Cristy Durene36217-1BradfordPleasant Hi161Cliffton AsNedra HaitEdRa(<MEASUREMENovamed Eye Surgery Center Of [place] Dba Premier Surgery Center>eoFrChambersbFarrelAlfonse RFayrene F74eaCTomah Va Medical CenterDelta Community Medical CenterTDarrelyn HiR6Adair County Memorial HospitalE45607RadE1610BaKentuckSCharle33sGe10669817253413245640Erie County Medical Cente18rRenne Brand Tarzana Surgical Institute IncMarland KitchenrMarland KitchenE40Wolf92gang PThe CookeviChristus Spohn Hospital Beeville27lSmithfield Ether G97504 KirklKentucCharletMEli Lilly andRad20A98Southside ChesconesManceMar4098Sue Optic9811914Cristy Durene14717-0KnightsenSicily Isla161Cliffton AsNedra HaitEdRa(7Hic<MEASUREMESeneca Pa Asc LLFarrelAlfonse RFayrene F4Buffalo Ambulatory Services Inc Dba Buffalo Ambulatory Surgery CenterWarren Memorial HospitalKDarrelyn HiR6Dubuque Endoscopy Center LcE45(5770RadE1610BaKentuckSCharle1sGe38269817253413242440Goodland Regional Medical Cente38rRenne Savoy Medical CenterMarland KitchenrMarland KitchenE40Wolf50gang PEast Metro EnCoastal Surgical Specialists Inc62dSmithfield Ether G97510 SunnyKentucCharletMEli Lilly andRad16A98Ko VManceMar4098Sue Optic9811914Cristy Durene54270-2GothenburgMiller's Co161Cliffton AsNedra HaitEdRF<MEASUREMEMerit Health Garden Grove>chMcMiDevereux Childrens FarrelAlfonse RFayrene F72eaNUropartners Surgery Center LLCBluMarias Medical CentereDarrelyn HiR6Russell County HospitalE453155RadE1610BaKentuckSCharle51sGe19(207)[phone] Healthcare Lawrence Memorial Hospital Camp9uRenne Ambulatory Surgery Center Of SpartanburgMarland KitchenrMarland KitchenE40Wolf6gang PBoston University Eye Associates Inc Dba Boston University Eye Associates SurgerParkwest Medical Center27ySmithfield Ether G964 MilKentucCharletMEli Lilly andRad45A98CashManceMar4098Sue Optic9811914Cristy Durene16(217) 0LeonardPittsbu161Cliffton AsNedra HaitEdRa4L<MEASUREMELongview Surgical Center LLC>nsKSurgery Center Of Scottsdale LLC Dba Mountain View SFarrelAlfonse RFayrene F20eaEJefferson Washington TownshipMisenUc Health Pikes Peak Regional HospitalhDarrelyn HiR6436 [place] LLCE45(506RadE1610BaKentuckSCharle14sGe81913([id number] Memorial Hospita40lRenne Norwood HospitalMarland KitchenrMarland KitchenE40Wolf43gang PTristar HoriChristus Mother Frances Hospital - Winnsboro17zSmithfield Ether G938 East SomKenCharletMEli Lilly andRad35A98ShorevManceMar4098Sue Optic9811914Cristy Durene17(938)2TiffinReidla161Cliffton AsNedra HaitEdRa3Lo<MEASUREMEFort Myers Surgery CenterHighsmith-FarrelAlfonse RFayrene F21eaGlasgow Medical Center LLCTierraCentral Dupage Hospital Darrelyn HiR6Grand Junction Va Medical CenterE45(44565RadE1610BaK[phone] Barbara Outpatient Surgery Center LLC Dba Santa Barbara Surgery Cente3rRenne Newton Memorial HospitalMarland KitchenrMarland KitchenE40Wolf94gang PHealthsouth Deaconess RehabCentral Valley Medical Center43iSmithfield Ether G99907 CambrKenCharletMEli Lilly andRad58A98St. GeoManceMar4098Sue Optic9811914Cristy Durene64770-3RidgewayVien161Cliffton AsNedra HaitEdRa<MEASUREMECommunity Medical Center>atThe Surgery Center At SeFarrelAlfonse RFayrene F14eCommunity Medical Center, IncMcKiVa Medical Center - Nashville CampustDarrelyn HiR6[facility] [address] [facility] - Dba Trinity MolineE45(8030RadE1610BaKentuckSCharle39sGe51(863)[phone] Bradfor54dRenne Ocean Spring Surgical And Endoscopy CenterMarland KitchenrMarland KitchenE40Wolf8gang PCoral Gables Surgery Center43MSmithfield Ether G97173 HomesKentuckGCharletMEli Lilly andRad78A98BeaManceMar4098Sue Optic9811914Cristy Durene45(872)0FenwickTonto Bas161Cliffton AsNedra HaitEdR<MEASUREMEFargo Va Medical Center>elSouth MerrimackFarrelAlfonse RFayrene FUh College Of Optometry Surgery Center Dba Uhco Surgery CenterWNorth Suburban Spine Center LPaDarrelyn HiR6Bethesda Chevy Chase Surgery Center LLC Dba Bethesda Chevy Chase Surgery CenterE45315RadE1610BaKen[id number] Long Island Hospita16lRenne Cornerstone Hospital Of Houston - Clear LakeMarland KitchenrMarland KitchenE40Wolf22gang PToms Ri481 Asc Project LLC28vSmithfield Ether G948 FoKentucCharletMEli Lilly andRad54A98WesManceMar4098Sue Optic9811914Cristy Durene63559-2SanfordLa R161Cliffton AsNedra HaitEdR<MEASUREMEHouston Methodist Continuing Care Hospital>BaptFarrelAlfonse RFayrene F13Community Heart And Vascular HospitalJohn Mission Hospital McdowellSDarrelyn HiR6Bridgepoint Hospital Capitol HillE459019RadE1610BaKen[id number] - Amg Specialty Hospita84lRenne Laureate Psychiatric Clinic And HospitalMarland KitchenrMarland KitchenE40Wolf59gang PAdvanceMarshfeild Medical Center4dSmithfield Ether G913 FKentuckCharletMEli Lilly andRad38A98KinsManceMar4098Sue Optic9811914Cristy Durene71931-4GorsOberl161Cliffton AsNedra HaitEdRa5<MEASUREMESpectra Eye Institute LLC>voHAFarrelAlfonse RFayrene F2Methodist Mansfield Medical CenterMountaiTexas Health Outpatient Surgery Center AlliancenDarrelyn HiR6Tennova Healthcare North Knoxville Medical CenterE4575RadE1610BaKen[phone] Laser And Lens Implant Center LL19CRenne Swedish Medical CenterMarland KitchenrMarland KitchenE40Wolf65gang PNorthside Melrosewkfld Healthcare Melrose-Wakefield Hospital Campus16oSmithfield Ether G97976 Indian SpKentucCharletMEli Lilly andRad45A98Lake GogeManceMar4098Sue Optic9811914Cristy Durene100(509) 3FieldsboroBaileyt161Cliffton AsNedra HaitEdRa3L<MEASUREMEFranciscan St Margaret Health - Dyer>erSeliPromiFarrelAlfonse RFayrene F1eaChSauk Prairie HospitalHeEndoscopy Center Of Topeka LPaDarrelyn HiR6Niagara Falls Memorial Medical CenterE45(58546RadE1610BaKen[phone] Ambulatory Surgery Cente71rRenne [facility] ([facility])Marland KitchenrMarland KitchenE40Wolf61gang PPromise HosOlympia Eye Clinic Inc Ps21pSmithfield Ether G9375 West PlyKentuCharletMEli Lilly andRad43A98BransfManceMar4098Sue Optic9811914Cristy Durene27681-6JeffersonDeari161Cliffton AsNedra HaitEdRBar<MEASUREMEWest Wichita Family Physicians Pa>svLaThe Center For Digestive And Liver Health FarrelAlfonse RFayrene F74Red Lake HospitalCotton Sugarland Rehab HospitalVDarrelyn HiR6Palm Point Behavioral HealthE45(8138RadE1610BaKentuckSCharle52sGe33(732) [phone] Scott & White Mclane Children'S Medical Cente20rRenne Northeast Alabama Eye Surgery CenterMarland KitchenrMarland KitchenE40Wolf40gang PAdvanced Diagnostic And SNewport Beach Surgery Center L P71uSmithfield Ether G98545 MKentucCharletMEli Lilly andRad59A98De SManceMar4098Sue Optic9811914Cristy Durene64779 4MelrosHinckl161Cliffton AsNedra HaitEdRa(80<MEASUREMEUniversity Of Kansas Hospital Transplant Center>ihLiNaperville Psychiatric Ventures - FarrelAlfonse RFayrene FWoodridge Behavioral CenterJamBeaumont Hospital Grosse PointeeDarrelyn HiR6Marshall Browning HospitalE45738RadE1610BaKentuckSCharle104sGe18(443) [phone] Vincent Regional Medical Cente12rRenne [address] Surgical Center LLCMarland KitchenrMarland KitchenE40Wolf41gang PThe Addiction InsFairfax Community Hospital42tSmithfield Ether G99201 PaciKenCharletMEli Lilly andRad3A98CharloManceMar4098Sue Optic9811914Cristy Durene28701BillingsElmi161Cliffton AsNedra HaitEdRa<MEASUREMESouthwestern Regional Medical Center>cUniversity Of Maryland HFarrelAlfonse RFayrene F75eVan Buren County HospitalEagleKindred Hospital - PhiladeLPhia Darrelyn HiR6Dini-Townsend Hospital At Northern Nevada Adult Mental Health ServicesE457131RadE1610BaKentuckSCharle84sGe38(669)[phone] River Medical Cente63rRenne [address]. Elizabeth OwenMarland KitchenrMarland KitchenE40Wolf20gang PThe Center FKelsey Seybold Clinic Asc Main41oSmithfield Ether G98763 ProspeKentuckGCharletMEli Lilly andRad54A98MempManceMar4098Sue Optic9811914Cristy Durene26385-8SuncookNorth Oa161Cliffton AsNedra HaitEdRa(5<MEASUREMENorthside Hospital - Cherokee>ntLeaCataliFarrelAlfonse RFayrene F6Conway Regional Medical CenterChicago HStockton Outpatient Surgery Center LLC Dba Ambulatory Surgery Center Of StocktoneDarrelyn HiR6Riverwood Healthcare CenterE45957RadE1610BaKentuckSCharle82sGe61(217)[phone] Park Hospita109lRenne Munson Healthcare Charlevoix HospitalMarland KitchenrMarland KitchenE40Wolf15gang PGranite City Illinois Hospital Company Gateway RegioMethodist Rehabilitation Hospital35nSmithfield Ether G98183 RobKentuCharletMEli Lilly andRad7A98UttManceMar4098Sue Optic9811914Cristy Durene17563-5BrooksDollar Poi161Cliffton AsNedra HaitEdRa(<MEASUREMEKindred Hospital Northern IndianaBaptFarrelAlfonse RFayrene FMedical City Of AllianceFeHazleton Endoscopy Center IncrDarrelyn HiR6Anderson Regional Medical CenterE45662RadE1610BaKentuckSCharle50sGe76(617)[phone] Health St. Franci8sRenne Marshall [facility] (1-Rh)Marland KitchenrMarland KitchenE40Wolf61gang PBay Area Center Sacred Va [facility] Beach Healthcare System22eSmithfield Ether G9344 DevonsKentucCharletMEli Lilly andRad27A98Amador CManceMar4098Sue Optic9811914Cristy Durene64947-1Sterling RanchSouth Elg161Cliffton AsNedra HaitEdRa4<MEASUREMEBaylor Scott & White Medical Center - Lakeway>inARiva FarrelAlfonse RFayrene F38eTitusville Area HospitalLake BroChi Health St. FranciswDarrelyn HiR6Uhhs Memorial Hospital Of GenevaE45685RadE1610BaKentuckSCharle26sGe28787 [id number] Studios LL63CRenne Mayo Clinic Health Sys FairmntMarland KitchenrMarland KitchenE40Wolf43gang PMidtoSelect Specialty Hospital Pensacola69wSmithfield Ether G914 OxKentCharletMEli Lilly andRad47A98ManorhaManceMar4098Sue Optic9811914Cristy Durene41705-6Santa Ana PuebloWatsonvil161Cliffton AsNedra HaitEdRa(<MEASUREMESurgicare Of Laveta Dba Barranca Surgery Center>LehighFarrelAlfonse RFayrene F57eaBaSheridan Memorial HospitalHRiver Road Surgery Center LLCoDarrelyn HiR6Northwest Eye SpecialistsLLCE45(94162RadE1610BaKentuckSCharle39sGe42680-4([phone] Hermann Sugar Lan14dRenne Va Puget Sound Health Care System - American Lake DivisionMarland KitchenrMarland KitchenE40Wolf37gang PBlue Mountain HospiEye Surgery Center Of Wichita LLC58tSmithfield Ether G9893 West LongfKentucCharletMEli Lilly andRad35A98OresManceMar4098Sue Optic9811914Cristy Durene7386-7ChandlervillIo161Cliffton AsNedra HaitEdRa(<MEASUREMEEmory Univ Hospital- Emory Univ Ortho>koSugarloaf Black River FarrelAlfonse RFayrene F32eaScottsdale Healthcare SheaCarRehabiliation Hospital Of Overland ParkpDarrelyn HiR6Live Oak Endoscopy Center LLCE459115RadE1610BaKentuckSCharle33sGe57502-59817253413245640Bournewood Hospita66lRenne Providence HospitalMarland KitchenrMarland KitchenE40Wolf69gang PMt PlScotland County Hospital10eSmithfield Ether G961 WhitemKentucCharletMEli Lilly andRad56A98PalmManceMar4098Sue Optic9811914Cristy Durene7903-7Shell LakWinne161Cliffton AsNedra HaitEdR<MEASUREMEAtlanta General And Bariatric Surgery Centere LLC>eWilson DigeFarrelAlfonse RFayrene F70eaMWest Chester EndoscopyNewSunrise CanyoniDarrelyn HiR6Och Regional Medical CenterE455132RadE1610BaKen[phone] Outpatient Surgery Cente41rRenne Pender Memorial Hospital, IncMarland KitchenrMarland KitchenE40Wolf53gang PMethodist DalEndoscopy Center Of Southeast Texas LP7lSmithfield Ether G9831 KentuCharletMEli Lilly andRad4A98HypolManceMar4098Sue Optic9811914Cristy Durene50(709)2ArgylNorth Riv161Cliffton AsNedra HaitEdRa(9<MEASUREMEMedical City Fort WorFarrelAlfonse RFayrene F86eaBoThe Surgery Center Of Alta Bates Summit Medical Center LLDickinson County Memorial HospitalEDarrelyn HiR6Jay HospitalE45(954RadE1610BaKen[phone] Lakes Medical Cent94eRenne Orlando Health South Seminole HospitalMarland KitchenrMarland KitchenE40Wolf46gang PMt Sinai HospiPerry Community Hospital61tSmithfield Ether G9983 SanKentucCharletMEli Lilly andRad12A98JacksonviManceMar4098Sue Optic9811914Cristy Durene88754-0KalkaskaWinfr161Cliffton AsNedra HaitEdRa9Sandy CSnellvAlfonse RFayrene Villa [facility]val[facility] (Dp/Snf)FDarrelyn Hill8131RadEdBaKentuckSCharlesGe49848-4618Austin State HospitalLoveGarland Behavioral HospitaliMarland KitchenE409CharletMEli Lilly and41 Com98Riggins1Mar40981191Cristy Durene45 780-8PassaicFloral Ci161Cliffton AstersRa2M<MEASUREMEJesse Brown Va Medical Center - Va Chicago Healthcare System>t ChiFarrelAlfonse RFayrene F28eSumma Rehab HospitalHarwood HCenterpointe HospitaleDarrelyn HiR6Banner Fort Collins Medical CenterE455153RadE1610BaKentuckSCharle72sGe32320-9817253413247340Wesley Rehabilitation Hospita60lRenne Center For Behavioral MedicineMarland KitchenrMarland KitchenE40Wolf53gang PFlWest Creek Surgery Center88oSmithfield Ether G974 East GleKentucCharletMEli Lilly andRad40A98CatManceMar4098Sue Optic9811914Cristy Durene75(262) 5PocahontasWestmo161Cliffton AsNedra HaitEd<MEASUREMEKelsey Seybold Clinic Asc SpringChi St. Vincent Hot Springs Rehabilitation Hospital An FarrelAlfonse RFayrene F23eMedical City Of PlanoPort Uva Healthsouth Rehabilitation HospitalODarrelyn HiR6Aurora West Allis Medical CenterE459358RadE1610BaKen[id number] Laser Centercentral LL43CRenne Hemet Valley Health Care CenterMarland KitchenrMarland KitchenE40Wolf50gang PMedThe Surgery Center Of Newport Coast LLC21iSmithfield Ether G9120 KenCharletMEli Lilly andRad72A98Eucalyptus HiManceMar4098Sue Optic9811914Cristy Durene52(510) 4StockbridgPeos161Cliffton AsNedra HaitEdRa8<MEASUREMEDoctors' Community Hospital>nvEndoscopic SurFarrelAlfonse RFayrene F58eaNeDrexel Town Square Surgery CenterFayette County HospitalODarrelyn HiR6Sturgis Regional HospitalE45(3532RadE1610BaKentuckSCharle92sGe57(785)[phone]41nRenne St. Mary'S Medical Center, San FranciscoMarland KitchenrMarland KitchenE40Wolf4gang PCumberlandPromise Hospital Of Louisiana-Shreveport Campus39 [place] Ether G97901 AmheKentucCharletMEli Lilly andRad28A98Angola on the LManceMar4098Sue Optic9811914Cristy Durene73(850)6SalvoEl161Cliffton AsNedra HaitEdRa6Zortman2780nnersEXTTAG>

## 2018-05-11 NOTE — Plan of Care (Signed)

## 2018-05-12 MED ORDER — TAMSULOSIN HCL 0.4 MG PO CAPS
0.4000 mg | ORAL_CAPSULE | Freq: Every day | ORAL | Status: DC
  Administered 2018-05-12 – 2018-05-14 (×3): 0.4 mg via ORAL
  Filled 2018-05-12 (×3): qty 1

## 2018-05-12 MED ORDER — MAGNESIUM CITRATE PO SOLN
1.0000 | Freq: Once | ORAL | Status: AC
  Administered 2018-05-12: 1 via ORAL
  Filled 2018-05-12: qty 296

## 2018-05-12 NOTE — Progress Notes (Signed)
Mid Level ordered foley catheter. Peri care performed and patient educated on foley insertion. 59F foley catheter inserted using sterile procedure. 1000cc of clear yellow urine drained upon insertion. Will continue to monitor.

## 2018-05-12 NOTE — Progress Notes (Signed)
Received verbal order for 1 bottle of magnesium citrate dose times one from Dr. Clearnce Sorrel.

## 2018-05-12 NOTE — Progress Notes (Signed)
PROGRESS NOTE    [name]  ZOX:[id number] DOB: 06/14/1946 DOA: 05/08/2018 PCP: Vivien Presto, MD  Brief Narrative:  72 year old with past medical history relevant for enclosing spondylitis, obstructive sleep apnea, hypertension, dementia, alcohol abuse admitted with recurrent falls, unstable C6 fracture and altered mental status likely secondary to alcohol withdrawal pending placement.   Assessment & Plan:   Active Problems:   Chronic pain syndrome   Essential hypertension   Ankylosing spondylitis (HCC)   Multiple falls   Acute encephalopathy   #) Alcohol abuse/withdrawal/altered mental status: His altered mental status and likely his alcohol withdrawal is resolved.  Suspect most likely due to alcohol withdrawal.  His last drink was on 05/05/2018 and since then patient has been falling and noted to be altered. -Continue CIWA protocol -Continue thiamine and folate supplementation  #) Urinary retention: Patient was noted to have progressive urinary retention despite several in and out caths.  At this time suspect BPH as there is low suspicion for any spinal cord injury. -We will start tamsulosin 0.4 mg daily -Indwelling Foley ordered  #) Unstable C6 fracture: This was in the setting of recurrent falls.  Neurosurgery Dr. Danielle Dess has signed off and patient will see him as an outpatient in the clinic. -Continue cervical collar -Pending placement  #) Chronic pain: - Continue MS Contin 30 mill grams 3 times daily -Continue pregabalin 150 mg 3 times daily  #) Hypertension/lower extremity edema: -Continue furosemide 40 mg daily -Continue amlodipine 10 mg daily -Continue losartan 100 mg nightly  #) Pain/psych: -Continue above pain medications -Continue sertraline 100 mg daily  Fluids: Tolerating p.o. Elect lites: Monitor and supplement Nutrition: Heart healthy diet   Prophylaxis: Enoxaparin  Disposition: Pending skilled nursing facility placement  Full  code     Consultants:   [facility], Dr. Danielle Dess  Procedures:   none  Antimicrobials:  none    Subjective: Patient reports he is doing fairly well this morning.  He did have some abdominal pain and found to have significant urinary retention yesterday that did not improve despite several in and out caths.  A Foley catheter was placed and is much more comfortable.  He denies any nausea, vomiting, abdominal pain, cough, congestion, rhinorrhea.  Objective: Vitals:   05/11/18 0512 05/11/18 1800 05/12/18 0002 05/12/18 0600  BP: 136/70 139/73 (!) 141/87 136/68  Pulse: 66 71 60 60  Resp: 19 20 18 18   Temp: 98 F (36.7 C) 98.7 F (37.1 C) 98.1 F (36.7 C) 98 F (36.7 C)  TempSrc: Oral Oral Oral Oral  SpO2: 98% 95% 96% 95%  Weight:      Height:        Intake/Output Summary (Last 24 hours) at 05/12/2018 1004 Last data filed at 05/12/2018 0900 Gross per 24 hour  Intake 720 ml  Output 3750 ml  Net -3030 ml   Filed Weights   05/08/18 1044  Weight: 108.9 kg    Examination:  General exam: Appears calm and comfortable  Respiratory system: Clear to auscultation. Respiratory effort normal. Cardiovascular system: Regular rate and rhythm, no murmurs. Gastrointestinal system: Abdomen is nondistended, soft and nontender. No organomegaly or masses felt. Normal bowel sounds heard. Central nervous system: Alert and oriented.  Is intact, moving all extremities, some midline tenderness, cervical collar in place Extremities: Trace lower extremity edema Skin: Contusions noted Psychiatry: Judgement and insight appear normal. Mood & affect appropriate.     Data Reviewed: I have personally reviewed following labs and imaging studies  CBC: Recent  Labs  Lab 05/08/18 1201 05/08/18 1748 05/10/18 0524  WBC 9.4  --  6.7  NEUTROABS 7.5  --   --   HGB 11.9*  --  12.8*  HCT 36.0* 32.5* 38.6*  MCV 92.1  --  92.8  PLT 170  --  182   Basic Metabolic Panel: Recent Labs  Lab 05/08/18 1201  05/09/18 0504 05/10/18 0524  NA 133* 136 138  K 4.3 4.1 3.7  CL 99 102 103  CO2 24 25 26   GLUCOSE 115* 93 105*  BUN 25* 25* 22  CREATININE 1.56* 1.28* 1.19  CALCIUM 8.7* 8.7* 9.2   GFR: Estimated Creatinine Clearance: 73.6 mL/min (by C-G formula based on SCr of 1.19 mg/dL). Liver Function Tests: Recent Labs  Lab 05/08/18 1201  AST 25  ALT 19  ALKPHOS 92  BILITOT 1.3*  PROT 7.7  ALBUMIN 3.8   No results for input(s): LIPASE, AMYLASE in the last 168 hours. Recent Labs  Lab 05/08/18 1201  AMMONIA 22   Coagulation Profile: No results for input(s): INR, PROTIME in the last 168 hours. Cardiac Enzymes: Recent Labs  Lab 05/08/18 1201  CKTOTAL 154   BNP (last 3 results) No results for input(s): PROBNP in the last 8760 hours. HbA1C: No results for input(s): HGBA1C in the last 72 hours. CBG: Recent Labs  Lab 05/08/18 1209 05/11/18 0518  GLUCAP 104* 89   Lipid Profile: No results for input(s): CHOL, HDL, LDLCALC, TRIG, CHOLHDL, LDLDIRECT in the last 72 hours. Thyroid Function Tests: No results for input(s): TSH, T4TOTAL, FREET4, T3FREE, THYROIDAB in the last 72 hours. Anemia Panel: No results for input(s): VITAMINB12, FOLATE, FERRITIN, TIBC, IRON, RETICCTPCT in the last 72 hours. Sepsis Labs: No results for input(s): PROCALCITON, LATICACIDVEN in the last 168 hours.  Recent Results (from the past 240 hour(s))  Culture, blood (routine x 2)     Status: None (Preliminary result)   Collection Time: 05/08/18 12:02 PM  Result Value Ref Range Status   Specimen Description BLOOD SITE NOT SPECIFIED DRAWN BY RN  Final   Special Requests   Final    BOTTLES DRAWN AEROBIC AND ANAEROBIC Blood Culture adequate volume   Culture   Final    NO GROWTH 4 DAYS Performed at [facility], [address] [place], [place] [address]    Report Status PENDING  Incomplete  Culture, blood (routine x 2)     Status: None (Preliminary result)   Collection Time: 05/08/18 12:49 PM  Result  Value Ref Range Status   Specimen Description BLOOD RIGHT HAND  Final   Special Requests   Final    BOTTLES DRAWN AEROBIC AND ANAEROBIC Blood Culture adequate volume   Culture   Final    NO GROWTH 4 DAYS Performed at [facility], [address] [place], [place] [address]    Report Status PENDING  Incomplete         Radiology Studies: No results found.      Scheduled Meds: . amLODipine  10 mg Oral Daily  . enoxaparin (LOVENOX) injection  40 mg Subcutaneous Daily  . folic acid  1 mg Oral Daily  . furosemide  40 mg Oral Daily  . losartan  100 mg Oral QPM  . morphine  30 mg Oral Q8H  . multivitamin with minerals  1 tablet Oral Daily  . pregabalin  150 mg Oral TID  . sertraline  100 mg Oral Daily  . thiamine  100 mg Oral Daily   Or  .  thiamine  100 mg Intravenous Daily   Continuous Infusions:   LOS: 3 days    Time spent: 35    Delaine Lame, MD [facility]  If 7PM-7AM, please contact night-coverage [email or web address] Password [email or web address] 05/12/2018, 10:04 AM

## 2018-05-13 LAB — CULTURE, BLOOD (ROUTINE X 2)
CULTURE: NO GROWTH
CULTURE: NO GROWTH
SPECIAL REQUESTS: ADEQUATE
Special Requests: ADEQUATE

## 2018-05-13 NOTE — Progress Notes (Signed)
PROGRESS NOTE    [name]  WJX:[id number] DOB: September 14, 1945 DOA: 05/08/2018 PCP: Vivien Presto, MD  Brief Narrative:  72 year old with past medical history relevant for enclosing spondylitis, obstructive sleep apnea, hypertension, dementia, alcohol abuse admitted with recurrent falls, unstable C6 fracture and altered mental status likely secondary to alcohol withdrawal pending placement.   Assessment & Plan:   Active Problems:   Chronic pain syndrome   Essential hypertension   Ankylosing spondylitis (HCC)   Multiple falls   Acute encephalopathy   #) Alcohol abuse/withdrawal/altered mental status: His altered mental status and likely his alcohol withdrawal is resolved.  His last drink was on 05/05/2018 and since then patient has been falling and noted to be altered. -Continue CIWA protocol -Continue thiamine and folate supplementation  #) Urinary retention: Foley placed on 05/12/2018 for urinary retention with failure of in and out catheterization -Continue tamsulosin 0.4 mg daily -Indwelling Foley ordered  #) Unstable C6 fracture: This was in the setting of recurrent falls.  Neurosurgery Dr. Danielle Dess has signed off and patient will see him as an outpatient in the clinic. -Continue cervical collar -Pending placement  #) Chronic pain: - Continue MS Contin 30 mill grams 3 times daily -Continue pregabalin 150 mg 3 times daily  #) Hypertension/lower extremity edema: -Continue furosemide 40 mg daily -Continue amlodipine 10 mg daily -Continue losartan 100 mg nightly  #) Pain/psych: -Continue above pain medications -Continue sertraline 100 mg daily  Fluids: Tolerating p.o. Elect lites: Monitor and supplement Nutrition: Heart healthy diet   Prophylaxis: Enoxaparin  Disposition: Pending skilled nursing facility placement  Full code     Consultants:   [facility], Dr. Danielle Dess  Procedures:   none  Antimicrobials:  none    Subjective: Patient reports he is  doing fairly well this morning.  He does not have any complaints.  He denies any nausea, vomiting, diarrhea, chest pain, cough, congestion.  Objective: Vitals:   05/12/18 0600 05/12/18 1200 05/12/18 2108 05/13/18 0527  BP: 136/68 121/78 135/67 129/74  Pulse: 60 61 (!) 58 (!) 54  Resp: 18 20 20 20   Temp: 98 F (36.7 C) 98.1 F (36.7 C) 98.3 F (36.8 C) 98.6 F (37 C)  TempSrc: Oral Oral Oral Oral  SpO2: 95% 95% 95% 94%  Weight:      Height:        Intake/Output Summary (Last 24 hours) at 05/13/2018 1010 Last data filed at 05/13/2018 0900 Gross per 24 hour  Intake 720 ml  Output 1700 ml  Net -980 ml   Filed Weights   05/08/18 1044  Weight: 108.9 kg    Examination:  General exam: Appears calm and comfortable  Respiratory system: Clear to auscultation. Respiratory effort normal. Cardiovascular system: Regular rate and rhythm, no murmurs. Gastrointestinal system: Abdomen is nondistended, soft and nontender. No organomegaly or masses felt. Normal bowel sounds heard. Central nervous system: Alert and oriented.  Is intact, moving all extremities, some midline tenderness, cervical collar in place Extremities: Trace lower extremity edema Skin: Contusions noted Psychiatry: Judgement and insight appear normal. Mood & affect appropriate.     Data Reviewed: I have personally reviewed following labs and imaging studies  CBC: Recent Labs  Lab 05/08/18 1201 05/08/18 1748 05/10/18 0524  WBC 9.4  --  6.7  NEUTROABS 7.5  --   --   HGB 11.9*  --  12.8*  HCT 36.0* 32.5* 38.6*  MCV 92.1  --  92.8  PLT 170  --  182   Basic  Metabolic Panel: Recent Labs  Lab 05/08/18 1201 05/09/18 0504 05/10/18 0524  NA 133* 136 138  K 4.3 4.1 3.7  CL 99 102 103  CO2 24 25 26   GLUCOSE 115* 93 105*  BUN 25* 25* 22  CREATININE 1.56* 1.28* 1.19  CALCIUM 8.7* 8.7* 9.2   GFR: Estimated Creatinine Clearance: 73.6 mL/min (by C-G formula based on SCr of 1.19 mg/dL). Liver Function  Tests: Recent Labs  Lab 05/08/18 1201  AST 25  ALT 19  ALKPHOS 92  BILITOT 1.3*  PROT 7.7  ALBUMIN 3.8   No results for input(s): LIPASE, AMYLASE in the last 168 hours. Recent Labs  Lab 05/08/18 1201  AMMONIA 22   Coagulation Profile: No results for input(s): INR, PROTIME in the last 168 hours. Cardiac Enzymes: Recent Labs  Lab 05/08/18 1201  CKTOTAL 154   BNP (last 3 results) No results for input(s): PROBNP in the last 8760 hours. HbA1C: No results for input(s): HGBA1C in the last 72 hours. CBG: Recent Labs  Lab 05/08/18 1209 05/11/18 0518  GLUCAP 104* 89   Lipid Profile: No results for input(s): CHOL, HDL, LDLCALC, TRIG, CHOLHDL, LDLDIRECT in the last 72 hours. Thyroid Function Tests: No results for input(s): TSH, T4TOTAL, FREET4, T3FREE, THYROIDAB in the last 72 hours. Anemia Panel: No results for input(s): VITAMINB12, FOLATE, FERRITIN, TIBC, IRON, RETICCTPCT in the last 72 hours. Sepsis Labs: No results for input(s): PROCALCITON, LATICACIDVEN in the last 168 hours.  Recent Results (from the past 240 hour(s))  Culture, blood (routine x 2)     Status: None   Collection Time: 05/08/18 12:02 PM  Result Value Ref Range Status   Specimen Description BLOOD SITE NOT SPECIFIED DRAWN BY RN  Final   Special Requests   Final    BOTTLES DRAWN AEROBIC AND ANAEROBIC Blood Culture adequate volume   Culture   Final    NO GROWTH 5 DAYS Performed at [facility], [address] [place], [place] [address]    Report Status 05/13/2018 FINAL  Final  Culture, blood (routine x 2)     Status: None   Collection Time: 05/08/18 12:49 PM  Result Value Ref Range Status   Specimen Description BLOOD RIGHT HAND  Final   Special Requests   Final    BOTTLES DRAWN AEROBIC AND ANAEROBIC Blood Culture adequate volume   Culture   Final    NO GROWTH 5 DAYS Performed at [facility], [address] [place], [place] [address]    Report Status 05/13/2018 FINAL  Final          Radiology Studies: No results found.      Scheduled Meds: . amLODipine  10 mg Oral Daily  . enoxaparin (LOVENOX) injection  40 mg Subcutaneous Daily  . folic acid  1 mg Oral Daily  . furosemide  40 mg Oral Daily  . losartan  100 mg Oral QPM  . morphine  30 mg Oral Q8H  . multivitamin with minerals  1 tablet Oral Daily  . pregabalin  150 mg Oral TID  . sertraline  100 mg Oral Daily  . tamsulosin  0.4 mg Oral QPC supper  . thiamine  100 mg Oral Daily   Or  . thiamine  100 mg Intravenous Daily   Continuous Infusions:   LOS: 4 days    Time spent: 35    Delaine Lame, MD [facility]  If 7PM-7AM, please contact night-coverage [email or web address] Password [email or web address] 05/13/2018, 10:10 AM

## 2018-05-14 MED ORDER — TAMSULOSIN HCL 0.4 MG PO CAPS: 0.4000 mg | ORAL_CAPSULE | Freq: Every day | ORAL | 1 refills | Status: DC

## 2018-05-14 NOTE — Discharge Summary (Addendum)
Physician Discharge Summary  [name] WJX:[id number] DOB: 1946-04-30 DOA: 05/08/2018  PCP: Vivien Presto, MD  Admit date: 05/08/2018 Discharge date: 05/15/2018  Admitted From: Home Disposition:  SNF  Recommendations for Outpatient Follow-up:  1. Follow up with PCP in 1-2 weeks 2. Please obtain BMP/CBC in one week 3. Please follow up with neurosurgery as outpatient, please keep your cervical collar in place until you follow-up with neurosurgery.  Home Health: No Equipment/Devices: Cervical collar  Discharge Condition: Stable CODE STATUS: Full Diet recommendation: Heart Healthy  Brief/Interim Summary:  #) Alcohol abuse/withdrawal/altered mental status: Patient was admitted with altered mental status and recurrent falls.  This was thought to be secondary to his alcohol abuse as he drinks approximately 10 beers a night.  His last drink was on 05/05/2018.  Patient was maintained on CIWA protocol.  He was given thiamine and folate supplementation.  His altered mental status resolved.  A CT scan of the head on admission was unremarkable.  #) Unstable C6 fracture: Patient was noted to have unstable C6 fracture on CT imaging.  His neurological exam on discharge was unremarkable with bilateral upper extremity and lower extremity strength 5 out of 5.  He did not have any signs or symptoms of spinal cord compromise.  Neurosurgery was consulted on admission and recommended patient should continue cervical collar until he follows up with neurosurgery as an outpatient.  Because of his significant debility as well as this fracture and recurrent falls patient was deemed appropriate for skilled nursing facility placement.  #) Urinary retention: During hospitalization patient developed acute urinary retention.  He was started on tamsulosin.  Foley catheter was placed on 05/12/2018.  He should have bladder training to see if this Foley catheter can be discontinued.  #) Chronic pain: Patient was  continued on home MS Contin and pregabalin.  #) Hypertension/lower edema: Patient was continued on home losartan, lifting, furosemide.  #) Pain/psych: Continue pain medications.  Continue sertraline.  Patient seen and evaluated on 11/12 with no clinical changes noted.  He is stable for discharge to [facility] as noted previously.  Discharge Diagnoses:  Active Problems:   Chronic pain syndrome   Essential hypertension   Ankylosing spondylitis (HCC)   Multiple falls   Acute encephalopathy    Discharge Instructions   Allergies as of 05/15/2018      Reactions   Codeine Nausea Only      Medication List    TAKE these medications   amLODipine 10 MG tablet Commonly known as:  NORVASC Take 10 mg by mouth daily.   furosemide 40 MG tablet Commonly known as:  LASIX Take 40 mg by mouth every morning.   losartan 100 MG tablet Commonly known as:  COZAAR Take 100 mg by mouth every evening.   morphine 30 MG 12 hr tablet Commonly known as:  MS CONTIN Take 30 mg by mouth 3 (three) times daily.   naproxen sodium 220 MG tablet Commonly known as:  ALEVE Take 440 mg by mouth daily as needed (for pain).   pregabalin 150 MG capsule Commonly known as:  LYRICA Take 150 mg by mouth 3 (three) times daily.   sertraline 100 MG tablet Commonly known as:  ZOLOFT Take 100 mg by mouth daily.   tamsulosin 0.4 MG Caps capsule Commonly known as:  FLOMAX Take 1 capsule (0.4 mg total) by mouth daily after supper.      Contact information for after-discharge care    Destination    [facility] Preferred SNF .   Service:  Skilled Nursing Contact information: [address] [place] [address] [phone]             Allergies  Allergen Reactions  . Codeine Nausea Only    Consultations:  [facility], Dr. Danielle Dess  Procedures/Studies: Dg Chest 2 View  Result Date: 05/08/2018 CLINICAL DATA:  Larey Seat 2 days ago. Now with  hypoxia. History of hypertension, nonsmoker. EXAM: CHEST - 2 VIEW COMPARISON:  Chest x-ray of February 14, 2018 and chest CT scan of January 16, 2013. FINDINGS: The lungs are adequately inflated. There is no focal infiltrate. The interstitial markings are mildly prominent at the left lung base and to a lesser extent at the right lung base. There is no alveolar infiltrate. There is no pleural effusion or pneumothorax. The cardiac silhouette is enlarged but stable. The pulmonary vascularity is not engorged. Nerve stimulator electrodes project posterior to T7 and T8. There is mild compression of the body of T8 with loss of height anteriorly of approximately 15-20%. There is calcification of the anterior longitudinal ligament of the thoracic spine. IMPRESSION: Chronic bronchitic changes. No alveolar pneumonia nor CHF. Stable cardiomegaly. No evidence of post traumatic injury of the thorax. Mild partial compression of the body of T8 was present on a chest CT scan of January 16, 2013. Electronically Signed   By: David  [place] M.D.   On: 05/08/2018 13:13   Ct Head Wo Contrast  Result Date: 05/08/2018 CLINICAL DATA:  72 year old male with multiple falls since 05/06/2018. Denies loss of consciousness. Hit head but not sure where. Initial encounter. EXAM: CT HEAD WITHOUT CONTRAST CT CERVICAL SPINE WITHOUT CONTRAST TECHNIQUE: Multidetector CT imaging of the head and cervical spine was performed following the standard protocol without intravenous contrast. Multiplanar CT image reconstructions of the cervical spine were also generated. COMPARISON:  02/14/2018 CT head and cervical spine. FINDINGS: CT HEAD FINDINGS Brain: Streak artifact from occipital hardware. Taking this limitation into account, no intracranial hemorrhage or CT evidence of large acute infarct noted. Chronic microvascular changes. Global atrophy. No intracranial mass lesion noted on this unenhanced exam. Vascular: Vascular calcifications Skull: No skull fracture  Sinuses/Orbits: No acute orbital abnormality. Visualized paranasal sinuses clear. Other: Mastoid air cells and middle ear cavities clear. CT CERVICAL SPINE FINDINGS Prior fusion occiput to posterior aspect C4. Confluent anterior osteophyte and fusion of portions of occiput to C5. Confluent anterior osteophyte with fusion C7-T1. Previously noted fracture involving the C6 vertebra has progressed. Any motion of cervical spine is occurring at this level as patient is fused above and below this region. Fracture extends from the anterior aspect of the C6 vertebra in oblique position on the right into the inferior right C6 facet and the C6-7 facet joint and on the left into the left lateral vertebral body. Fracture of the anterior wall of the transverse foramen bilaterally. Loss of height C6 vertebra with broad fracture line through the compressed vertebra (which is sclerotic above emboli fracture line). Fracture C6 spinous process. Surrounding soft tissue prominence may indicate associated hemorrhage/edema. Infection could not excluded in proper clinical setting. Fractures most likely unstable. Upper chest: No worrisome abnormality. Other: No worrisome neck mass identified. IMPRESSION: CT HEAD 1. Streak artifact from occipital hardware. Taking this limitation into account, no intracranial hemorrhage or CT evidence of large acute infarct noted. 2. Chronic microvascular changes. Global atrophy. CT CERVICAL SPINE 1. Prior surgical fusion occiput to posterior aspect C4. 2. Confluent anterior osteophyte and  fusion of portions of occiput to C5. Confluent anterior osteophyte with fusion C7-T1. 3. Previously noted fracture involving the C6 vertebra has progressed. Any motion of cervical spine is occurring at this level as patient is fused above and below this region. Fracture extends from the anterior aspect of the C6 vertebra in oblique position on the right into the inferior right C6 facet and the C6-7 facet joint and on the  left into the left lateral vertebral body. Fracture of the anterior wall of the transverse foramen bilaterally. Loss of height C6 vertebra with broad fracture line through the compressed vertebra (which is sclerotic above emboli fracture line). Fracture C6 spinous process. Surrounding soft tissue prominence may indicate associated hemorrhage/edema. Infection could not excluded in proper clinical setting. Fractures most likely unstable. These results were called by telephone at the time of interpretation on 05/08/2018 at 2:00 pm to Dr. Blane Ohara , who verbally acknowledged these results. Electronically Signed   By: Lacy Duverney M.D.   On: 05/08/2018 14:13   Ct Cervical Spine Wo Contrast  Result Date: 05/08/2018 CLINICAL DATA:  72 year old male with multiple falls since 05/06/2018. Denies loss of consciousness. Hit head but not sure where. Initial encounter. EXAM: CT HEAD WITHOUT CONTRAST CT CERVICAL SPINE WITHOUT CONTRAST TECHNIQUE: Multidetector CT imaging of the head and cervical spine was performed following the standard protocol without intravenous contrast. Multiplanar CT image reconstructions of the cervical spine were also generated. COMPARISON:  02/14/2018 CT head and cervical spine. FINDINGS: CT HEAD FINDINGS Brain: Streak artifact from occipital hardware. Taking this limitation into account, no intracranial hemorrhage or CT evidence of large acute infarct noted. Chronic microvascular changes. Global atrophy. No intracranial mass lesion noted on this unenhanced exam. Vascular: Vascular calcifications Skull: No skull fracture Sinuses/Orbits: No acute orbital abnormality. Visualized paranasal sinuses clear. Other: Mastoid air cells and middle ear cavities clear. CT CERVICAL SPINE FINDINGS Prior fusion occiput to posterior aspect C4. Confluent anterior osteophyte and fusion of portions of occiput to C5. Confluent anterior osteophyte with fusion C7-T1. Previously noted fracture involving the C6 vertebra  has progressed. Any motion of cervical spine is occurring at this level as patient is fused above and below this region. Fracture extends from the anterior aspect of the C6 vertebra in oblique position on the right into the inferior right C6 facet and the C6-7 facet joint and on the left into the left lateral vertebral body. Fracture of the anterior wall of the transverse foramen bilaterally. Loss of height C6 vertebra with broad fracture line through the compressed vertebra (which is sclerotic above emboli fracture line). Fracture C6 spinous process. Surrounding soft tissue prominence may indicate associated hemorrhage/edema. Infection could not excluded in proper clinical setting. Fractures most likely unstable. Upper chest: No worrisome abnormality. Other: No worrisome neck mass identified. IMPRESSION: CT HEAD 1. Streak artifact from occipital hardware. Taking this limitation into account, no intracranial hemorrhage or CT evidence of large acute infarct noted. 2. Chronic microvascular changes. Global atrophy. CT CERVICAL SPINE 1. Prior surgical fusion occiput to posterior aspect C4. 2. Confluent anterior osteophyte and fusion of portions of occiput to C5. Confluent anterior osteophyte with fusion C7-T1. 3. Previously noted fracture involving the C6 vertebra has progressed. Any motion of cervical spine is occurring at this level as patient is fused above and below this region. Fracture extends from the anterior aspect of the C6 vertebra in oblique position on the right into the inferior right C6 facet and the C6-7 facet joint and on the left  into the left lateral vertebral body. Fracture of the anterior wall of the transverse foramen bilaterally. Loss of height C6 vertebra with broad fracture line through the compressed vertebra (which is sclerotic above emboli fracture line). Fracture C6 spinous process. Surrounding soft tissue prominence may indicate associated hemorrhage/edema. Infection could not excluded in  proper clinical setting. Fractures most likely unstable. These results were called by telephone at the time of interpretation on 05/08/2018 at 2:00 pm to Dr. Blane Ohara , who verbally acknowledged these results. Electronically Signed   By: Lacy Duverney M.D.   On: 05/08/2018 14:13      Subjective:   Discharge Exam: Vitals:   05/15/18 0808 05/15/18 1200  BP:  (!) 87/54  Pulse:  76  Resp:  17  Temp:  98.6 F (37 C)  SpO2: 92% 95%   Vitals:   05/14/18 2239 05/15/18 0550 05/15/18 0808 05/15/18 1200  BP: 130/74 115/71  (!) 87/54  Pulse: (!) 52 84  76  Resp: 18 18  17   Temp: 98.1 F (36.7 C) 98.3 F (36.8 C)  98.6 F (37 C)  TempSrc: Oral Oral  Oral  SpO2: 96% 99% 92% 95%  Weight:      Height:       General exam: Appears calm and comfortable  Respiratory system: Clear to auscultation. Respiratory effort normal. Cardiovascular system: Regular rate and rhythm, no murmurs. Gastrointestinal system: Abdomen is nondistended, soft and nontender. No organomegaly or masses felt. Normal bowel sounds heard. Central nervous system: Alert and oriented.  Is intact, moving all extremities, some midline tenderness, cervical collar in place Extremities: Trace lower extremity edema Skin: Contusions noted Psychiatry: Judgement and insight appear normal. Mood & affect appropriate.    The results of significant diagnostics from this hospitalization (including imaging, microbiology, ancillary and laboratory) are listed below for reference.     Microbiology: Recent Results (from the past 240 hour(s))  Culture, blood (routine x 2)     Status: None   Collection Time: 05/08/18 12:02 PM  Result Value Ref Range Status   Specimen Description BLOOD SITE NOT SPECIFIED DRAWN BY RN  Final   Special Requests   Final    BOTTLES DRAWN AEROBIC AND ANAEROBIC Blood Culture adequate volume   Culture   Final    NO GROWTH 5 DAYS Performed at [facility], [address], [place], [place] [address]     Report Status 05/13/2018 FINAL  Final  Culture, blood (routine x 2)     Status: None   Collection Time: 05/08/18 12:49 PM  Result Value Ref Range Status   Specimen Description BLOOD RIGHT HAND  Final   Special Requests   Final    BOTTLES DRAWN AEROBIC AND ANAEROBIC Blood Culture adequate volume   Culture   Final    NO GROWTH 5 DAYS Performed at [facility], [address], [place], [place] [address]    Report Status 05/13/2018 FINAL  Final     Labs: BNP (last 3 results) No results for input(s): BNP in the last 8760 hours. Basic Metabolic Panel: Recent Labs  Lab 05/09/18 0504 05/10/18 0524  NA 136 138  K 4.1 3.7  CL 102 103  CO2 25 26  GLUCOSE 93 105*  BUN 25* 22  CREATININE 1.28* 1.19  CALCIUM 8.7* 9.2   Liver Function Tests: No results for input(s): AST, ALT, ALKPHOS, BILITOT, PROT, ALBUMIN in the last 168 hours. No results for input(s): LIPASE, AMYLASE in the last 168 hours. No results for  input(s): AMMONIA in the last 168 hours. CBC: Recent Labs  Lab 05/08/18 1748 05/10/18 0524  WBC  --  6.7  HGB  --  12.8*  HCT 32.5* 38.6*  MCV  --  92.8  PLT  --  182   Cardiac Enzymes: No results for input(s): CKTOTAL, CKMB, CKMBINDEX, TROPONINI in the last 168 hours. BNP: Invalid input(s): POCBNP CBG: Recent Labs  Lab 05/11/18 0518  GLUCAP 89   D-Dimer No results for input(s): DDIMER in the last 72 hours. Hgb A1c No results for input(s): HGBA1C in the last 72 hours. Lipid Profile No results for input(s): CHOL, HDL, LDLCALC, TRIG, CHOLHDL, LDLDIRECT in the last 72 hours. Thyroid function studies No results for input(s): TSH, T4TOTAL, T3FREE, THYROIDAB in the last 72 hours.  Invalid input(s): FREET3 Anemia work up No results for input(s): VITAMINB12, FOLATE, FERRITIN, TIBC, IRON, RETICCTPCT in the last 72 hours. Urinalysis    Component Value Date/Time   COLORURINE YELLOW 05/08/2018 0100   APPEARANCEUR CLEAR 05/08/2018 0100   LABSPEC 1.010 05/08/2018 0100    PHURINE 6.0 05/08/2018 0100   GLUCOSEU NEGATIVE 05/08/2018 0100   HGBUR MODERATE (A) 05/08/2018 0100   BILIRUBINUR NEGATIVE 05/08/2018 0100   KETONESUR NEGATIVE 05/08/2018 0100   PROTEINUR NEGATIVE 05/08/2018 0100   UROBILINOGEN 0.2 02/28/2012 1652   NITRITE NEGATIVE 05/08/2018 0100   LEUKOCYTESUR NEGATIVE 05/08/2018 0100   Sepsis Labs Invalid input(s): PROCALCITONIN,  WBC,  LACTICIDVEN Microbiology Recent Results (from the past 240 hour(s))  Culture, blood (routine x 2)     Status: None   Collection Time: 05/08/18 12:02 PM  Result Value Ref Range Status   Specimen Description BLOOD SITE NOT SPECIFIED DRAWN BY RN  Final   Special Requests   Final    BOTTLES DRAWN AEROBIC AND ANAEROBIC Blood Culture adequate volume   Culture   Final    NO GROWTH 5 DAYS Performed at [facility], [address] [place], [place] [address]    Report Status 05/13/2018 FINAL  Final  Culture, blood (routine x 2)     Status: None   Collection Time: 05/08/18 12:49 PM  Result Value Ref Range Status   Specimen Description BLOOD RIGHT HAND  Final   Special Requests   Final    BOTTLES DRAWN AEROBIC AND ANAEROBIC Blood Culture adequate volume   Culture   Final    NO GROWTH 5 DAYS Performed at [facility], [address] [place], [place] [address]    Report Status 05/13/2018 FINAL  Final     Time coordinating discharge: 35  SIGNED:   Erick Blinks, MD  [facility] 05/15/2018, 12:38 PM  If 7PM-7AM, please contact night-coverage [email or web address] Password [email or web address]

## 2018-05-14 NOTE — Discharge Instructions (Signed)
Benign Prostatic Hyperplasia  Benign prostatic hyperplasia (BPH) is an enlarged prostate gland that is caused by the normal aging process and not by cancer. The prostate is a walnut-sized gland that is involved in the production of semen. It is located in front of the rectum and below the bladder. The bladder stores urine and the urethra is the tube that carries the urine out of the body. The prostate may get bigger as a man gets older.  An enlarged prostate can press on the urethra. This can make it harder to pass urine. The build-up of urine in the bladder can cause infection. Back pressure and infection may progress to bladder damage and kidney (renal) failure.  What are the causes?  This condition is part of a normal aging process. However, not all men develop problems from this condition. If the prostate enlarges away from the urethra, urine flow will not be blocked. If it enlarges toward the urethra and compresses it, there will be problems passing urine.  What increases the risk?  This condition is more likely to develop in men over the age of 50 years.  What are the signs or symptoms?  Symptoms of this condition include:  · Getting up often during the night to urinate.  · Needing to urinate frequently during the day.  · Difficulty starting urine flow.  · Decrease in size and strength of your urine stream.  · Leaking (dribbling) after urinating.  · Inability to pass urine. This needs immediate treatment.  · Inability to completely empty your bladder.  · Pain when you pass urine. This is more common if there is also an infection.  · Urinary tract infection (UTI).    How is this diagnosed?  This condition is diagnosed based on your medical history, a physical exam, and your symptoms. Tests will also be done, such as:  · A post-void bladder scan. This measures any amount of urine that may remain in your bladder after you finish urinating.  · A digital rectal exam. In a rectal exam, your health care provider  checks your prostate by putting a lubricated, gloved finger into your rectum to feel the back of your prostate gland. This exam detects the size of your gland and any abnormal lumps or growths.  · An exam of your urine (urinalysis).  · A prostate specific antigen (PSA) screening. This is a blood test used to screen for prostate cancer.  · An ultrasound. This test uses sound waves to electronically produce a picture of your prostate gland.    Your health care provider may refer you to a specialist in kidney and prostate diseases (urologist).  How is this treated?  Once symptoms begin, your health care provider will monitor your condition (active surveillance or watchful waiting). Treatment for this condition will depend on the severity of your condition. Treatment may include:  · Observation and yearly exams. This may be the only treatment needed if your condition and symptoms are mild.  · Medicines to relieve your symptoms, including:  ? Medicines to shrink the prostate.  ? Medicines to relax the muscle of the prostate.  · Surgery in severe cases. Surgery may include:  ? Prostatectomy. In this procedure, the prostate tissue is removed completely through an open incision or with a laparascope or robotics.  ? Transurethral resection of the prostate (TURP). In this procedure, a tool is inserted through the opening at the tip of the penis (urethra). It is used to cut away tissue of   the inner core of the prostate. The pieces are removed through the same opening of the penis. This removes the blockage.  ? Transurethral incision (TUIP). In this procedure, small cuts are made in the prostate. This lessens the prostate's pressure on the urethra.  ? Transurethral microwave thermotherapy (TUMT). This procedure uses microwaves to create heat. The heat destroys and removes a small amount of prostate tissue.  ? Transurethral needle ablation (TUNA). This procedure uses radio frequencies to destroy and remove a small amount of  prostate tissue.  ? Interstitial laser coagulation (ILC). This procedure uses a laser to destroy and remove a small amount of prostate tissue.  ? Transurethral electrovaporization (TUVP). This procedure uses electrodes to destroy and remove a small amount of prostate tissue.  ? Prostatic urethral lift. This procedure inserts an implant to push the lobes of the prostate away from the urethra.    Follow these instructions at home:  · Take over-the-counter and prescription medicines only as told by your health care provider.  · Monitor your symptoms for any changes. Contact your health care provider with any changes.  · Avoid drinking large amounts of liquid before going to bed or out in public.  · Avoid or reduce how much caffeine or alcohol you drink.  · Give yourself time when you urinate.  · Keep all follow-up visits as told by your health care provider. This is important.  Contact a health care provider if:  · You have unexplained back pain.  · Your symptoms do not get better with treatment.  · You develop side effects from the medicine you are taking.  · Your urine becomes very dark or has a bad smell.  · Your lower abdomen becomes distended and you have trouble passing your urine.  Get help right away if:  · You have a fever or chills.  · You suddenly cannot urinate.  · You feel lightheaded, or very dizzy, or you faint.  · There are large amounts of blood or clots in the urine.  · Your urinary problems become hard to manage.  · You develop moderate to severe low back or flank pain. The flank is the side of your body between the ribs and the hip.  These symptoms may represent a serious problem that is an emergency. Do not wait to see if the symptoms will go away. Get medical help right away. Call your local emergency services (911 in the [place] Do not drive yourself to the hospital.  Summary  · Benign prostatic hyperplasia (BPH) is an enlarged prostate that is caused by the normal aging process and not by  cancer.  · An enlarged prostate can press on the urethra. This can make it hard to pass urine.  · This condition is part of a normal aging process and is more likely to develop in men over the age of 50 years.  · Get help right away if you suddenly cannot urinate.  This information is not intended to replace advice given to you by your health care provider. Make sure you discuss any questions you have with your health care provider.  Document Released: 06/20/2005 Document Revised: 07/25/2016 Document Reviewed: 07/25/2016  Elsevier Interactive Patient Education © 2018 [employer]

## 2018-05-14 NOTE — Plan of Care (Signed)

## 2018-05-14 NOTE — Clinical Social Work Note (Signed)
Elease Hashimoto at [facility] is out today.  Left message for Shawna Orleans, Child psychotherapist, about authorization for patient-whether or not they have heard and, if not, could she call to get an update.

## 2018-05-15 NOTE — Care Management Note (Signed)
Case Management Note  Patient Details  Name: [name] MRN: [id number] Date of Birth: 1945-09-04  If discussed at Long Length of Stay Meetings, dates discussed:  05/15/18  Additional Comments:  Malcolm Metrohildress, Tanaysia Bhardwaj Demske, RN 05/15/2018, 1:34 PM

## 2018-05-15 NOTE — Progress Notes (Signed)
Physical Therapy Treatment Patient Details Name: [name] MRN: [id number] DOB: 28-Apr-1946 Today's Date: 05/15/2018    History of Present Illness [name] is a 72 y.o. male with medical history significant for Ankylosing spondylitis, OSA, HTN, who was brought to the Ed by spouse, with reports of multiple falls and confusion since last fall 2 days ago- 11/3.  Fall was not witnessed, and patient cannot remember details of the fall. Spouse heard patient fall at about 6 AM in the morning, but she had to call their son to get patient up. Over the past 24 hours spouse reports increasing confusion and weakness.  Patient at baseline ambulates with a walker, gets his breakfast in the morning, takes care of himself for the most part but over the past 2 days he has been sitting in his wheelchair, able to take care of himself and transfers have been a big challenge.  At baseline he has some mild dementia, but he is able to make conversation and recognize family members.  Today patient reported seeing "small people in his room that fade away".  Denies auditory hallucinations.     PT Comments    Pt was seen for evaluation of current status of mobility and then to instruct him on stretches and posturing to increase upright standing and improve ROM to hips and ankles.  Pt is very motivated to work on the exercises and agreed to continue on his own.  Leaving soon for SNF but will follow up after with HHPT to continue all balance and strengthening ex's   Follow Up Recommendations  SNF;Supervision/Assistance - 24 hour;Supervision for mobility/OOB     Equipment Recommendations  None recommended by PT    Recommendations for Other Services       Precautions / Restrictions Precautions Precautions: Fall Precaution Comments: C6 cervical spine fracture Required Braces or Orthoses: Cervical Brace Cervical Brace: Hard collar;At all times Restrictions Weight Bearing Restrictions: No     Mobility  Bed Mobility Overal bed mobility: Needs Assistance Bed Mobility: Supine to Sit;Sit to Supine     Supine to sit: Min assist Sit to supine: Min assist   General bed mobility comments: with head of bed rasied approximately 30 degrees  Transfers Overall transfer level: Needs assistance Equipment used: Rolling walker (2 wheeled) Transfers: Sit to/from Stand Sit to Stand: Min assist Stand pivot transfers: Min assist       General transfer comment: min assist to power up then pt transitioned to walker to steady in standing  Ambulation/Gait Ambulation/Gait assistance: Min guard Gait Distance (Feet): 40 Feet Assistive device: Rolling walker (2 wheeled) Gait Pattern/deviations: Step-through pattern;Wide base of support;Trunk flexed;Shuffle;Decreased stride length Gait velocity: decreased Gait velocity interpretation: <1.31 ft/sec, indicative of household ambulator General Gait Details: wide turns and slowed pace with flexed hips and knees   Stairs             Wheelchair Mobility    Modified Rankin (Stroke Patients Only)       Balance Overall balance assessment: Needs assistance Sitting-balance support: Feet supported Sitting balance-Leahy Scale: Good     Standing balance support: During functional activity;Bilateral upper extremity supported Standing balance-Leahy Scale: Fair(less than fair dynamically) Standing balance comment: using RW                            Cognition Arousal/Alertness: Awake/alert Behavior During Therapy: WFL for tasks assessed/performed Overall Cognitive Status: Within Functional Limits for tasks assessed  Exercises General Exercises - Lower Extremity Ankle Circles/Pumps: AAROM;Both;5 reps Quad Sets: AROM;Both;10 reps Gluteal Sets: AROM;Both;10 reps    General Comments        Pertinent Vitals/Pain Pain Assessment: Faces Faces Pain Scale: Hurts even  more Pain Location: lower neck and low back Pain Descriptors / Indicators: Grimacing;Guarding Pain Intervention(s): Limited activity within patient's tolerance;Monitored during session;Premedicated before session;Repositioned    Home Living                      Prior Function            PT Goals (current goals can now be found in the care plan section) Acute Rehab PT Goals Patient Stated Goal: return home after rehab Progress towards PT goals: Progressing toward goals    Frequency    Min 3X/week      PT Plan Current plan remains appropriate    Co-evaluation              AM-PAC PT "6 Clicks" Daily Activity  Outcome Measure  Difficulty turning over in bed (including adjusting bedclothes, sheets and blankets)?: Unable Difficulty moving from lying on back to sitting on the side of the bed? : Unable Difficulty sitting down on and standing up from a chair with arms (e.g., wheelchair, bedside commode, etc,.)?: Unable Help needed moving to and from a bed to chair (including a wheelchair)?: A Little Help needed walking in hospital room?: A Little Help needed climbing 3-5 steps with a railing? : A Little 6 Click Score: 12    End of Session Equipment Utilized During Treatment: Gait belt;Cervical collar Activity Tolerance: Patient tolerated treatment well;Patient limited by fatigue Patient left: in bed;with call bell/phone within reach;with bed alarm set Nurse Communication: Mobility status PT Visit Diagnosis: Unsteadiness on feet (R26.81);Other abnormalities of gait and mobility (R26.89);Muscle weakness (generalized) (M62.81)     Time: 1914-7829 PT Time Calculation (min) (ACUTE ONLY): 25 min  Charges:  $Gait Training: 8-22 mins $Therapeutic Exercise: 8-22 mins                   Ivar Drape 05/15/2018, 8:05 PM   8:07 PM, 05/15/18 Samul Dada, PT, MS Physical Therapist - [place] [phone] (Office)

## 2018-05-15 NOTE — Clinical Social Work Placement (Signed)
   CLINICAL SOCIAL WORK PLACEMENT  NOTE  Date:  05/15/2018  Patient Details  Name: [name] MRN: [id number] Date of Birth: 29-May-1946  Clinical Social Work is seeking post-discharge placement for this patient at the Skilled  Nursing Facility level of care (*CSW will initial, date and re-position this form in  chart as items are completed):  Yes   Patient/family provided with [facility] Clinical Social Work Department's list of facilities offering this level of care within the geographic area requested by the patient (or if unable, by the patient's family).  Yes   Patient/family informed of their freedom to choose among providers that offer the needed level of care, that participate in Medicare, Medicaid or managed care program needed by the patient, have an available bed and are willing to accept the patient.  Yes   Patient/family informed of [facility] ownership interest in [facility] and [facility], as well as of the fact that they are under no obligation to receive care at these facilities.  PASRR submitted to EDS on 05/09/18     PASRR number received on 05/09/18     Existing PASRR number confirmed on       FL2 transmitted to all facilities in geographic area requested by pt/family on 05/09/18     FL2 transmitted to all facilities within larger geographic area on       Patient informed that his/her managed care company has contracts with or will negotiate with certain facilities, including the following:        Yes   Patient/family informed of bed offers received.  [facility] bed [facility]     Physician recommends and patient chooses bed at      [facility].  Patient to be transferred to facility by Spouse     Patient family notified on 05/15/18 of transfer.  Name of family member notified:  Odis HollingsheadRuth Provencio     PHYSICIAN       Additional Comment:  Wife to provide  transportation.  States she needs help with transfer to and from vehicle.  CSW confirmed with nursing staff and receiving facility.   _______________________________________________ Ida Rogueodney B Lester Crickenberger, LCSW 05/15/2018, 2:30 PM

## 2018-05-17 ENCOUNTER — Ambulatory Visit: Payer: Medicare Other | Admitting: Neurology

## 2018-05-17 ENCOUNTER — Encounter: Payer: Self-pay | Admitting: Neurology

## 2018-05-17 VITALS — BP 96/61 | HR 56 | Ht 72.0 in | Wt 225.5 lb

## 2018-05-17 DIAGNOSIS — R269 Unspecified abnormalities of gait and mobility: Secondary | ICD-10-CM | POA: Diagnosis not present

## 2018-05-17 DIAGNOSIS — M6281 Muscle weakness (generalized): Secondary | ICD-10-CM | POA: Diagnosis not present

## 2018-05-17 NOTE — Progress Notes (Signed)
Reason for visit: Gait disorder  Referring physician: Dr. Julaine [name] [name] is a 72 y.o. male  History of present illness:  [name] is a 72 year old right-handed white male with a history of a chronic gait disorder.  The patient has had chronic low back pain since he was in high school.  He has been told he has ankylosing spondylitis.  The patient began having alteration in his ability to walk about 2 or 3 years ago.  The patient has had bilateral total knee replacements.  When he is walking upright, he cannot fully extend either knee and he walks with his knees flexed.  He has also been told that he has a peripheral neuropathy.  He has a history of excessive alcohol intake, he usually drinks on average about 10 beers a day.  The patient unfortunately had a recent fall that required hospitalization on 08 May 2018.  He had an unstable C6 fracture.  The patient went into alcohol withdrawal prior to the hospitalization and was confused and hallucinating.  He has been in rehab through [facility], he has not had any further problems with confusion.  The patient reports that when he tries to walk, he will have a fatigue sensation that occurs with the legs and his legs will tend to collapse and he goes down.  The patient claims that he is not able to walk more than 100 feet at a time at this point.  The patient does have some weakness of the arms as well.  He reports chronic low back pain and pain down into the legs.  He reports some numbness in the hands, he denies any significant numbness in the feet.  The patient denies issues controlling the bowels or the bladder.  He does report a history of restless leg syndrome.  The patient has never had a blackout event while standing, he denies any dizziness before he falls.  Given the worsening gait problem, he is sent to this office for an evaluation.  He did have CK enzyme levels drawn that were normal in the hospital, the vitamin B12  level was normal as was a thyroid profile.  Past Medical History:  Diagnosis Date  . Arthritis   . Hypertension     Past Surgical History:  Procedure Laterality Date  . ANKLE SURGERY    . BACK SURGERY    . JOINT REPLACEMENT    . NECK SURGERY    . TOTAL KNEE ARTHROPLASTY      Family History  Problem Relation Age of Onset  . Heart disease Mother     Social history:  reports that he has never smoked. He has never used smokeless tobacco. He reports that he drinks alcohol. He reports that he does not use drugs.  Medications:  Prior to Admission medications   Medication Sig Start Date End Date Taking? Authorizing Provider  amLODipine (NORVASC) 10 MG tablet Take 10 mg by mouth daily.     Yes [provider]  furosemide (LASIX) 40 MG tablet Take 40 mg by mouth every morning. 08/15/17  Yes [provider]  losartan (COZAAR) 100 MG tablet Take 100 mg by mouth every evening. 08/27/15  Yes [provider]  morphine (MS CONTIN) 30 MG 12 hr tablet Take 30 mg by mouth 3 (three) times daily.   Yes [provider]  naproxen sodium (ALEVE) 220 MG tablet Take 440 mg by mouth daily as needed (for pain).   Yes [provider]  pregabalin (LYRICA) 150 MG capsule Take 150 mg by mouth 3 (three) times daily.     Yes [provider]  sertraline (ZOLOFT) 100 MG tablet Take 100 mg by mouth daily. 01/08/18  Yes [provider]  tamsulosin (FLOMAX) 0.4 MG CAPS capsule Take 1 capsule (0.4 mg total) by mouth daily after supper. 05/14/18  Yes [name], Salli QuarryShrey C, MD      Allergies  Allergen Reactions  . Codeine Nausea Only    ROS:  Out of a complete 14 system review of symptoms, the patient complains only of the following symptoms, and all other reviewed systems are negative.  Snoring Constipation Urination problems Joint pain, aching muscles  Blood pressure 96/61, pulse (!) 56, height 6' (1.829 m), weight 225 lb 8 oz (102.3 kg).   Blood  pressure, standing, right arm is 98/50.  Physical Exam  General: The patient is alert and cooperative at the time of the examination.  Eyes: Pupils are equal, round, and reactive to light. Discs are flat bilaterally.  Neck: The neck is in a [place] collar, the neck cannot be examined.  Respiratory: The respiratory examination is clear.  Cardiovascular: The cardiovascular examination reveals a regular rate and rhythm, no obvious murmurs or rubs are noted.  Skin: Extremities are with 1+ edema below the knees.  Neurologic Exam  Mental status: The patient is alert and oriented x 3 at the time of the examination. The patient has apparent normal recent and remote memory, with an apparently normal attention span and concentration ability.  Cranial nerves: Facial symmetry is present. There is good sensation of the face to pinprick and soft touch bilaterally. The strength of the facial muscles and the muscles to head turning and shoulder shrug are normal bilaterally. Speech is well enunciated, no aphasia or dysarthria is noted. Extraocular movements are full. Visual fields are full. The tongue is midline, and the patient has symmetric elevation of the soft palate. No obvious hearing deficits are noted.  Motor: The motor testing reveals 5 over 5 strength of all 4 extremities, with exception of some weakness of the intrinsic muscles of the hands bilaterally, possibly slight weakness of the left deltoid muscle. Good symmetric motor tone is noted throughout.  Sensory: Sensory testing is intact to pinprick, soft touch, vibration sensation, and position sense on the upper extremities.  With the lower extremities there is a stocking pattern pinprick sensory deficit to just below the knees bilaterally, there is some impairment of vibration sensation in the left foot greater than the right and impairment of position sense in both feet.  No evidence of extinction is noted.  Coordination: Cerebellar  testing reveals good finger-nose-finger and heel-to-shin bilaterally.  Gait and station: Gait is wide-based, the patient can walk with assistance.  The knees are flexed with walking.  Tandem gait was not attempted.  Reflexes: Deep tendon reflexes are symmetric, but are depressed bilaterally. Toes are downgoing bilaterally.   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 Known fracture at the C6 level without malalignment or apparent compression of the neural structures.  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 This encroaches upon the spinal canal and flattens the left side of the cord. This would obviously be chronic. Neural stimulators are present in the dorsal epidural space and there is prominent dorsal epidural fat, but no evidence cyst or neoplastic mass.  * CT scan  images were reviewed online. I agree with the written report.    CT Cervical and head 05/08/18:  IMPRESSION: CT HEAD  1. Streak artifact from occipital hardware. Taking this limitation into account, no intracranial hemorrhage or CT evidence of large acute infarct noted. 2. Chronic microvascular changes. Global atrophy.  CT CERVICAL SPINE  1. Prior surgical fusion occiput to posterior aspect C4. 2. Confluent anterior osteophyte and fusion of portions of occiput to C5. Confluent anterior osteophyte with fusion C7-T1. 3. Previously noted fracture involving the C6 vertebra has progressed. Any motion of cervical spine is occurring at this level as patient is fused above and below this region. Fracture extends from the anterior aspect of the C6 vertebra in oblique position on the right into the inferior right C6 facet and the C6-7 facet joint and on the left into the left lateral vertebral body. Fracture of the anterior wall of the transverse foramen bilaterally. Loss of height C6  vertebra with broad fracture line through the compressed vertebra (which is sclerotic above emboli fracture line). Fracture C6 spinous process. Surrounding soft tissue prominence may indicate associated hemorrhage/edema. Infection could not excluded in proper clinical setting. Fractures most likely unstable.    Assessment/Plan:  1.  Chronic gait disorder  2.  Possible peripheral neuropathy  3.  Alcohol abuse  4.  Chronic low back pain, leg discomfort  The patient will be coming in for EMG and nerve conduction study in the future.  We will do nerve conduction studies on both legs and one arm, EMG on at least one leg.  Blood work will be done today.  The patient is in rehab, he needs to work on leg strengthening exercises.  The patient describes issues with fatigue of the legs.  He appears to be running a low blood pressures with sitting and standing, he does not appear to be severely orthostatic however.  The gait disorder likely is multifactorial, and is related to inability to straighten the knees out, with an underlying peripheral neuropathy and chronic low back issues.  The EMG nerve conduction study will help determine whether there is a primary neuromuscular issue adding to the problem.  He will follow-up otherwise in 4 months.  Marlan Palau MD 05/17/2018 3:10 PM  [facility] [address] [place], [place] [address]  Phone [phone] Fax [phone]

## 2018-05-25 LAB — MULTIPLE MYELOMA PANEL, SERUM
ALPHA2 GLOB SERPL ELPH-MCNC: 0.9 g/dL (ref 0.4–1.0)
Albumin SerPl Elph-Mcnc: 3.9 g/dL (ref 2.9–4.4)
Albumin/Glob SerPl: 1.1 (ref 0.7–1.7)
Alpha 1: 0.3 g/dL (ref 0.0–0.4)
B-GLOBULIN SERPL ELPH-MCNC: 1.5 g/dL — AB (ref 0.7–1.3)
GLOBULIN, TOTAL: 3.7 g/dL (ref 2.2–3.9)
Gamma Glob SerPl Elph-Mcnc: 1.1 g/dL (ref 0.4–1.8)
IGG (IMMUNOGLOBIN G), SERUM: 1302 mg/dL (ref 700–1600)
IGM (IMMUNOGLOBULIN M), SRM: 90 mg/dL (ref 20–172)
IgA/Immunoglobulin A, Serum: 849 mg/dL — ABNORMAL HIGH (ref 61–437)
TOTAL PROTEIN: 7.6 g/dL (ref 6.0–8.5)

## 2018-05-25 LAB — LYME, WESTERN BLOT, SERUM (REFLEXED)
IGG P45 AB.: ABSENT
IGG P58 AB.: ABSENT
IGG P66 AB.: ABSENT
IgG P23 Ab.: ABSENT
IgG P28 Ab.: ABSENT
IgG P30 Ab.: ABSENT
IgG P39 Ab.: ABSENT
IgG P41 Ab.: ABSENT
IgG P93 Ab.: ABSENT
IgM P23 Ab.: ABSENT
IgM P39 Ab.: ABSENT
IgM P41 Ab.: ABSENT
Lyme IgG Wb: NEGATIVE
Lyme IgM Wb: NEGATIVE

## 2018-05-25 LAB — COPPER, SERUM: Copper: 138 ug/dL (ref 72–166)

## 2018-05-25 LAB — ACETYLCHOLINE RECEPTOR, BINDING

## 2018-05-25 LAB — VGCC ANTIBODY: VGCC ANTIBODY: NEGATIVE

## 2018-05-25 LAB — B. BURGDORFI ANTIBODIES: Lyme IgG/IgM Ab: 1.1 {ISR} — ABNORMAL HIGH (ref 0.00–0.90)

## 2018-05-25 LAB — ANA W/REFLEX: Anti Nuclear Antibody(ANA): NEGATIVE

## 2018-05-28 ENCOUNTER — Telehealth: Payer: Self-pay | Admitting: *Deleted

## 2018-05-28 NOTE — Telephone Encounter (Signed)
-----   Message from York Spanielharles K Willis, MD sent at 05/25/2018  1:14 PM EST -----  The blood work results are unremarkable. Please call the patient.  ----- Message ----- From: Nell RangeInterface, [facility] Lab Results In Sent: 05/18/2018   7:39 AM EST To: York Spanielharles K Willis, MD

## 2018-05-28 NOTE — Telephone Encounter (Signed)
Spoke with wife, Windell MouldingRuth, and reviewed below lab results.  She verbalized understanding of same/fim

## 2018-05-30 ENCOUNTER — Encounter: Payer: Medicare Other | Admitting: Neurology

## 2018-06-06 ENCOUNTER — Encounter: Payer: Self-pay | Admitting: Neurology

## 2018-06-06 ENCOUNTER — Ambulatory Visit (INDEPENDENT_AMBULATORY_CARE_PROVIDER_SITE_OTHER): Payer: Medicare Other | Admitting: Neurology

## 2018-06-06 DIAGNOSIS — G603 Idiopathic progressive neuropathy: Secondary | ICD-10-CM

## 2018-06-06 DIAGNOSIS — R269 Unspecified abnormalities of gait and mobility: Secondary | ICD-10-CM

## 2018-06-06 DIAGNOSIS — G629 Polyneuropathy, unspecified: Secondary | ICD-10-CM | POA: Insufficient documentation

## 2018-06-06 HISTORY — DX: Polyneuropathy, unspecified: G62.9

## 2018-06-06 NOTE — Procedures (Signed)
     HISTORY:  Judeth CornfieldWilliam Ades is a 72 year old gentleman with a history of alcohol abuse who has had a chronic issue with gait instability and multiple falls.  He reports weakness and fatigue of the muscles of the legs bilaterally.  He is being evaluated for this issue.  NERVE CONDUCTION STUDIES:  Nerve conduction studies were performed on the right upper extremity. The distal motor latencies and motor amplitudes for the median and ulnar nerves were within normal limits. The nerve conduction velocities for these nerves were also normal, with exception that there was slight slowing for the right ulnar nerve above the elbow only. The sensory latencies for the median, radial, and ulnar nerves were normal. The F wave latency for the ulnar nerve was prolonged.  Nerve conduction studies were performed on both lower extremities.  The distal motor latencies for the peroneal nerves were normal bilaterally with a low motor amplitudes for these nerves bilaterally.  The distal motor latency for the left posterior tibial nerve was unobtainable, prolonged on the right.  A low motor amplitude for the right posterior tibial nerve was noted.  Slowing was seen for the peroneal nerves bilaterally and for the right posterior tibial nerve.  The sural sensory latencies were within normal limits bilaterally but were unobtainable for the peroneal nerves bilaterally.  The F-wave latency for the right posterior tibial nerve was prolonged.  EMG STUDIES:  EMG study was performed on the right lower extremity:  The tibialis anterior muscle reveals 2 to 5K motor units with slightly decreased recruitment. No fibrillations or positive waves were seen. The peroneus tertius muscle reveals 2 to 5K motor units with slightly decreased recruitment. No fibrillations or positive waves were seen. The medial gastrocnemius muscle reveals 1 to 3K motor units with full recruitment. No fibrillations or positive waves were seen. The vastus  lateralis muscle reveals 2 to 4K motor units with full recruitment. No fibrillations or positive waves were seen. The iliopsoas muscle reveals 2 to 4K motor units with full recruitment. No fibrillations or positive waves were seen. The biceps femoris muscle (long head) reveals 2 to 4K motor units with full recruitment. No fibrillations or positive waves were seen. The lumbosacral paraspinal muscles were tested at 3 levels, and revealed no abnormalities of insertional activity at all 3 levels tested. There was good relaxation.   IMPRESSION:  Nerve conduction studies done on the right upper extremity and both lower extremities shows evidence of a primarily axonal peripheral neuropathy of moderate severity.  EMG evaluation of the right lower extremity shows mild chronic stable distal signs of denervation consistent with the diagnosis of peripheral neuropathy.  No evidence of an overlying lumbosacral radiculopathy or evidence of a myopathic disorder was seen.  Marlan Palau. Keith  MD 06/06/2018 4:21 PM  [facility] [address] [place], [place] [id number]  Phone [phone] Fax [phone]

## 2018-06-06 NOTE — Progress Notes (Signed)
The patient comes in for EMG and nerve conduction study evaluation.  Nerve conduction studies show a primarily axonal peripheral neuropathy of moderate severity.  EMG of the right leg does not show evidence of an overlying myopathic disorder or a significant lumbosacral radiculopathy.  The gait disorder is likely multifactorial related to poor mobility with inability to fully extend the knees, chronic low back pain, peripheral neuropathy, and alcohol overuse.  I have recommended home health physical therapy and develop a regimen to help strengthen the extensor muscles of the thighs to help lessen the fatigue with walking.  The patient has knee pain and low back pain almost immediately after he bears weight.  He does have a walker with a seat.

## 2018-06-06 NOTE — Progress Notes (Signed)
Please refer to EMG and nerve conduction procedure note.  

## 2018-07-20 ENCOUNTER — Other Ambulatory Visit: Payer: Self-pay

## 2018-07-20 ENCOUNTER — Inpatient Hospital Stay (HOSPITAL_COMMUNITY)
Admission: EM | Admit: 2018-07-20 | Discharge: 2018-07-31 | DRG: 917 | Disposition: A | Discharge status: Skilled Nursing Facility | Payer: Medicare Other | Attending: Internal Medicine | Admitting: Internal Medicine

## 2018-07-20 ENCOUNTER — Emergency Department (HOSPITAL_COMMUNITY): Payer: Medicare Other

## 2018-07-20 ENCOUNTER — Encounter (HOSPITAL_COMMUNITY): Payer: Self-pay

## 2018-07-20 DIAGNOSIS — Z9181 History of falling: Secondary | ICD-10-CM

## 2018-07-20 DIAGNOSIS — I10 Essential (primary) hypertension: Secondary | ICD-10-CM | POA: Diagnosis present

## 2018-07-20 DIAGNOSIS — R296 Repeated falls: Secondary | ICD-10-CM | POA: Diagnosis present

## 2018-07-20 DIAGNOSIS — Z96659 Presence of unspecified artificial knee joint: Secondary | ICD-10-CM | POA: Diagnosis present

## 2018-07-20 DIAGNOSIS — E860 Dehydration: Secondary | ICD-10-CM | POA: Diagnosis present

## 2018-07-20 DIAGNOSIS — K5900 Constipation, unspecified: Secondary | ICD-10-CM | POA: Diagnosis present

## 2018-07-20 DIAGNOSIS — T40601A Poisoning by unspecified narcotics, accidental (unintentional), initial encounter: Secondary | ICD-10-CM | POA: Diagnosis present

## 2018-07-20 DIAGNOSIS — J9601 Acute respiratory failure with hypoxia: Secondary | ICD-10-CM | POA: Diagnosis present

## 2018-07-20 DIAGNOSIS — M4854XA Collapsed vertebra, not elsewhere classified, thoracic region, initial encounter for fracture: Secondary | ICD-10-CM | POA: Diagnosis present

## 2018-07-20 DIAGNOSIS — R197 Diarrhea, unspecified: Secondary | ICD-10-CM | POA: Diagnosis not present

## 2018-07-20 DIAGNOSIS — R0902 Hypoxemia: Secondary | ICD-10-CM

## 2018-07-20 DIAGNOSIS — Z79891 Long term (current) use of opiate analgesic: Secondary | ICD-10-CM

## 2018-07-20 DIAGNOSIS — G629 Polyneuropathy, unspecified: Secondary | ICD-10-CM | POA: Diagnosis present

## 2018-07-20 DIAGNOSIS — K2980 Duodenitis without bleeding: Secondary | ICD-10-CM | POA: Diagnosis present

## 2018-07-20 DIAGNOSIS — Z9049 Acquired absence of other specified parts of digestive tract: Secondary | ICD-10-CM | POA: Diagnosis not present

## 2018-07-20 DIAGNOSIS — T402X1A Poisoning by other opioids, accidental (unintentional), initial encounter: Principal | ICD-10-CM | POA: Diagnosis present

## 2018-07-20 DIAGNOSIS — F1190 Opioid use, unspecified, uncomplicated: Secondary | ICD-10-CM | POA: Diagnosis not present

## 2018-07-20 DIAGNOSIS — X58XXXA Exposure to other specified factors, initial encounter: Secondary | ICD-10-CM | POA: Diagnosis not present

## 2018-07-20 DIAGNOSIS — N179 Acute kidney failure, unspecified: Secondary | ICD-10-CM | POA: Diagnosis present

## 2018-07-20 DIAGNOSIS — G4733 Obstructive sleep apnea (adult) (pediatric): Secondary | ICD-10-CM | POA: Diagnosis present

## 2018-07-20 DIAGNOSIS — M459 Ankylosing spondylitis of unspecified sites in spine: Secondary | ICD-10-CM | POA: Diagnosis present

## 2018-07-20 DIAGNOSIS — S22070D Wedge compression fracture of T9-T10 vertebra, subsequent encounter for fracture with routine healing: Secondary | ICD-10-CM | POA: Diagnosis not present

## 2018-07-20 DIAGNOSIS — G92 Toxic encephalopathy: Secondary | ICD-10-CM | POA: Diagnosis present

## 2018-07-20 DIAGNOSIS — G894 Chronic pain syndrome: Secondary | ICD-10-CM | POA: Diagnosis present

## 2018-07-20 DIAGNOSIS — R339 Retention of urine, unspecified: Secondary | ICD-10-CM | POA: Diagnosis present

## 2018-07-20 DIAGNOSIS — R112 Nausea with vomiting, unspecified: Secondary | ICD-10-CM | POA: Diagnosis not present

## 2018-07-20 DIAGNOSIS — J690 Pneumonitis due to inhalation of food and vomit: Secondary | ICD-10-CM | POA: Diagnosis present

## 2018-07-20 DIAGNOSIS — G9340 Encephalopathy, unspecified: Secondary | ICD-10-CM | POA: Diagnosis not present

## 2018-07-20 DIAGNOSIS — E876 Hypokalemia: Secondary | ICD-10-CM | POA: Diagnosis present

## 2018-07-20 DIAGNOSIS — K521 Toxic gastroenteritis and colitis: Secondary | ICD-10-CM | POA: Diagnosis not present

## 2018-07-20 DIAGNOSIS — S3131XA Laceration without foreign body of scrotum and testes, initial encounter: Secondary | ICD-10-CM | POA: Diagnosis not present

## 2018-07-20 DIAGNOSIS — T40601S Poisoning by unspecified narcotics, accidental (unintentional), sequela: Secondary | ICD-10-CM

## 2018-07-20 DIAGNOSIS — S22070A Wedge compression fracture of T9-T10 vertebra, initial encounter for closed fracture: Secondary | ICD-10-CM | POA: Diagnosis present

## 2018-07-20 DIAGNOSIS — T3695XA Adverse effect of unspecified systemic antibiotic, initial encounter: Secondary | ICD-10-CM | POA: Diagnosis not present

## 2018-07-20 DIAGNOSIS — R4182 Altered mental status, unspecified: Secondary | ICD-10-CM | POA: Diagnosis not present

## 2018-07-20 DIAGNOSIS — M450 Ankylosing spondylitis of multiple sites in spine: Secondary | ICD-10-CM

## 2018-07-20 DIAGNOSIS — R400 Somnolence: Secondary | ICD-10-CM

## 2018-07-20 HISTORY — DX: Tremor, unspecified: R25.1

## 2018-07-20 HISTORY — DX: Other hereditary and idiopathic neuropathies: G60.8

## 2018-07-20 HISTORY — DX: Other problems related to lifestyle: Z72.89

## 2018-07-20 HISTORY — DX: Atrioventricular block, first degree: I44.0

## 2018-07-20 HISTORY — DX: Unsteadiness on feet: R26.81

## 2018-07-20 HISTORY — DX: Obstructive sleep apnea (adult) (pediatric): G47.33

## 2018-07-20 HISTORY — DX: Ankylosing spondylitis of multiple sites in spine: M45.0

## 2018-07-20 HISTORY — DX: Other specified polyneuropathies: G62.89

## 2018-07-20 HISTORY — DX: Long term (current) use of opiate analgesic: Z79.891

## 2018-07-20 HISTORY — DX: Pain, unspecified: R52

## 2018-07-20 HISTORY — DX: Repeated falls: R29.6

## 2018-07-20 HISTORY — DX: Other chronic pain: G89.29

## 2018-07-20 LAB — URINALYSIS, ROUTINE W REFLEX MICROSCOPIC
Bilirubin Urine: NEGATIVE
Glucose, UA: NEGATIVE mg/dL
Ketones, ur: NEGATIVE mg/dL
Nitrite: NEGATIVE
PH: 5 (ref 5.0–8.0)
Protein, ur: NEGATIVE mg/dL
Specific Gravity, Urine: 1.012 (ref 1.005–1.030)

## 2018-07-20 LAB — CBC WITH DIFFERENTIAL/PLATELET
Abs Immature Granulocytes: 0.02 10*3/uL (ref 0.00–0.07)
BASOS PCT: 0 %
Basophils Absolute: 0 10*3/uL (ref 0.0–0.1)
EOS ABS: 0.1 10*3/uL (ref 0.0–0.5)
Eosinophils Relative: 1 %
HEMATOCRIT: 37.6 % — AB (ref 39.0–52.0)
Hemoglobin: 12.4 g/dL — ABNORMAL LOW (ref 13.0–17.0)
IMMATURE GRANULOCYTES: 0 %
LYMPHS ABS: 2 10*3/uL (ref 0.7–4.0)
Lymphocytes Relative: 21 %
MCH: 29.4 pg (ref 26.0–34.0)
MCHC: 33 g/dL (ref 30.0–36.0)
MCV: 89.1 fL (ref 80.0–100.0)
MONO ABS: 0.7 10*3/uL (ref 0.1–1.0)
Monocytes Relative: 7 %
NEUTROS ABS: 6.7 10*3/uL (ref 1.7–7.7)
Neutrophils Relative %: 71 %
PLATELETS: 243 10*3/uL (ref 150–400)
RBC: 4.22 MIL/uL (ref 4.22–5.81)
RDW: 13.4 % (ref 11.5–15.5)
WBC: 9.6 10*3/uL (ref 4.0–10.5)
nRBC: 0 % (ref 0.0–0.2)

## 2018-07-20 LAB — COMPREHENSIVE METABOLIC PANEL
ALT: 23 U/L (ref 0–44)
ANION GAP: 12 (ref 5–15)
AST: 19 U/L (ref 15–41)
Albumin: 3.8 g/dL (ref 3.5–5.0)
Alkaline Phosphatase: 130 U/L — ABNORMAL HIGH (ref 38–126)
BUN: 59 mg/dL — ABNORMAL HIGH (ref 8–23)
CHLORIDE: 97 mmol/L — AB (ref 98–111)
CO2: 25 mmol/L (ref 22–32)
Calcium: 8.9 mg/dL (ref 8.9–10.3)
Creatinine, Ser: 3.73 mg/dL — ABNORMAL HIGH (ref 0.61–1.24)
GFR calc non Af Amer: 15 mL/min — ABNORMAL LOW (ref >60)
GFR, EST AFRICAN AMERICAN: 18 mL/min — AB (ref >60)
Glucose, Bld: 116 mg/dL — ABNORMAL HIGH (ref 70–99)
POTASSIUM: 3.9 mmol/L (ref 3.5–5.1)
SODIUM: 134 mmol/L — AB (ref 135–145)
Total Bilirubin: 0.6 mg/dL (ref 0.3–1.2)
Total Protein: 7.6 g/dL (ref 6.5–8.1)

## 2018-07-20 LAB — SALICYLATE LEVEL

## 2018-07-20 LAB — RAPID URINE DRUG SCREEN, HOSP PERFORMED
AMPHETAMINES: NOT DETECTED
BARBITURATES: NOT DETECTED
Benzodiazepines: NOT DETECTED
COCAINE: NOT DETECTED
OPIATES: POSITIVE — AB
Tetrahydrocannabinol: NOT DETECTED

## 2018-07-20 LAB — ACETAMINOPHEN LEVEL: Acetaminophen (Tylenol), Serum: <10 ug/mL — ABNORMAL LOW (ref 10–30)

## 2018-07-20 LAB — PROTIME-INR
INR: 1.15
Prothrombin Time: 14.6 seconds (ref 11.4–15.2)

## 2018-07-20 LAB — MRSA PCR SCREENING: MRSA by PCR: POSITIVE — AB

## 2018-07-20 LAB — LACTIC ACID, PLASMA
Lactic Acid, Venous: 1 mmol/L (ref 0.5–1.9)
Lactic Acid, Venous: 0.6 mmol/L (ref 0.5–1.9)

## 2018-07-20 LAB — AMMONIA
Ammonia: 31 umol/L (ref 9–35)
Ammonia: 33 umol/L (ref 9–35)

## 2018-07-20 LAB — TROPONIN I: Troponin I: 0.04 ng/mL (ref <0.03)

## 2018-07-20 LAB — CBG MONITORING, ED: GLUCOSE-CAPILLARY: 115 mg/dL — AB (ref 70–99)

## 2018-07-20 LAB — CK: Total CK: 189 U/L (ref 49–397)

## 2018-07-20 LAB — ETHANOL

## 2018-07-20 MED ORDER — THIAMINE HCL 100 MG/ML IJ SOLN
100.0000 mg | Freq: Every day | INTRAMUSCULAR | Status: DC
  Administered 2018-07-20: 100 mg via INTRAVENOUS
  Filled 2018-07-20 (×2): qty 2

## 2018-07-20 MED ORDER — LORAZEPAM 1 MG PO TABS: 1.0000 mg | ORAL_TABLET | Freq: Four times a day (QID) | ORAL | Status: AC | PRN

## 2018-07-20 MED ORDER — THIAMINE HCL 100 MG/ML IJ SOLN
100.0000 mg | Freq: Once | INTRAMUSCULAR | Status: AC
  Administered 2018-07-20: 100 mg via INTRAVENOUS
  Filled 2018-07-20 (×2): qty 2

## 2018-07-20 MED ORDER — FOLIC ACID 1 MG PO TABS
1.0000 mg | ORAL_TABLET | Freq: Every day | ORAL | Status: DC
  Administered 2018-07-23 – 2018-07-31 (×9): 1 mg via ORAL
  Filled 2018-07-20 (×10): qty 1

## 2018-07-20 MED ORDER — LORAZEPAM 2 MG/ML IJ SOLN: 0.0000 mg | Freq: Four times a day (QID) | INTRAMUSCULAR | Status: AC

## 2018-07-20 MED ORDER — ONDANSETRON HCL 4 MG PO TABS: 4.0000 mg | ORAL_TABLET | Freq: Four times a day (QID) | ORAL | Status: DC | PRN

## 2018-07-20 MED ORDER — ADULT MULTIVITAMIN W/MINERALS CH
1.0000 | ORAL_TABLET | Freq: Every day | ORAL | Status: DC
  Administered 2018-07-23 – 2018-07-31 (×9): 1 via ORAL
  Filled 2018-07-20 (×10): qty 1

## 2018-07-20 MED ORDER — NALOXONE HCL 0.4 MG/ML IJ SOLN
INTRAMUSCULAR | Status: AC
  Filled 2018-07-20: qty 1

## 2018-07-20 MED ORDER — ENOXAPARIN SODIUM 30 MG/0.3ML ~~LOC~~ SOLN
30.0000 mg | SUBCUTANEOUS | Status: DC
  Administered 2018-07-20 – 2018-07-30 (×11): 30 mg via SUBCUTANEOUS
  Filled 2018-07-20 (×11): qty 0.3

## 2018-07-20 MED ORDER — NALOXONE HCL 0.4 MG/ML IJ SOLN
0.4000 mg | Freq: Once | INTRAMUSCULAR | Status: AC
  Administered 2018-07-20: 0.4 mg via INTRAVENOUS
  Filled 2018-07-20: qty 1

## 2018-07-20 MED ORDER — SODIUM CHLORIDE 0.9 % IV SOLN
INTRAVENOUS | Status: DC
  Administered 2018-07-20 (×2): via INTRAVENOUS

## 2018-07-20 MED ORDER — SODIUM CHLORIDE 0.9 % IV BOLUS
1000.0000 mL | Freq: Once | INTRAVENOUS | Status: AC
  Administered 2018-07-20: 1000 mL via INTRAVENOUS

## 2018-07-20 MED ORDER — LORAZEPAM 2 MG/ML IJ SOLN: 1.0000 mg | Freq: Four times a day (QID) | INTRAMUSCULAR | Status: AC | PRN

## 2018-07-20 MED ORDER — ONDANSETRON HCL 4 MG/2ML IJ SOLN
4.0000 mg | Freq: Four times a day (QID) | INTRAMUSCULAR | Status: DC | PRN
  Administered 2018-07-24 (×2): 4 mg via INTRAVENOUS
  Filled 2018-07-20 (×2): qty 2

## 2018-07-20 MED ORDER — NALOXONE HCL 0.4 MG/ML IJ SOLN
0.4000 mg | Freq: Once | INTRAMUSCULAR | Status: AC
  Administered 2018-07-20: 0.4 mg via INTRAVENOUS

## 2018-07-20 MED ORDER — ACETAMINOPHEN 325 MG PO TABS
650.0000 mg | ORAL_TABLET | Freq: Four times a day (QID) | ORAL | Status: DC | PRN
  Administered 2018-07-23 – 2018-07-30 (×14): 650 mg via ORAL
  Filled 2018-07-20 (×14): qty 2

## 2018-07-20 MED ORDER — LORAZEPAM 2 MG/ML IJ SOLN: 0.0000 mg | Freq: Two times a day (BID) | INTRAMUSCULAR | Status: AC

## 2018-07-20 MED ORDER — ACETAMINOPHEN 650 MG RE SUPP
650.0000 mg | Freq: Four times a day (QID) | RECTAL | Status: DC | PRN
  Administered 2018-07-21 – 2018-07-22 (×2): 650 mg via RECTAL
  Filled 2018-07-20 (×2): qty 1

## 2018-07-20 MED ORDER — VITAMIN B-1 100 MG PO TABS
100.0000 mg | ORAL_TABLET | Freq: Every day | ORAL | Status: DC
  Administered 2018-07-23 – 2018-07-31 (×8): 100 mg via ORAL
  Filled 2018-07-20 (×9): qty 1

## 2018-07-20 NOTE — ED Provider Notes (Signed)
[facility] EMERGENCY DEPARTMENT Provider Note   CSN: [id number] Arrival date & time: 07/20/18  0908     History   Chief Complaint Chief Complaint  Patient presents with  . Altered Mental Status    HPI [name] is a 73 y.o. male.  The history is provided by the EMS personnel, the patient and the spouse. The history is limited by the condition of the patient (AMS).  Altered Mental Status  Pt was seen at 0925. Per EMS: Pt with hx frequent falls, last fall yesterday, now with AMS. LKW 1700 yesterday.   Update 0945: Pt's wife at bedside. Pt's wife states pt has had a "slow decline" over the past 2 weeks, especially over the past few days. States he "really didn't get out of bed" yesterday or the day before. Pt's wife states pt has had AMS when she checked in on him at 0700 this morning. States he was "unable to use a straw" to "drink some ginger ale."  Pt's Pain Management doctor has been "trying to get him off of morphine with a clonidine patch."  No clonidine patch found on pt on arrival to ED, per ED RN. Pt's wife states he was cleared from wearing rigid cervical collar in 06/2018. Pt states he has also stopped drinking etoh since his admit for s/p fall 05/2018.     Past Medical History:  Diagnosis Date  . 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 Ankylosing spondylitis of multiple sites in spine (HCC)   . Chronic pain   . Chronically on opiate therapy    Pain Management d/c opiates 06/2018  . First degree AV block    chronic  . Gait instability   . Hypertension   . Multiple falls   . OSA (obstructive sleep apnea)   . Pain management   . Peripheral axonal neuropathy   . Peripheral neuropathy 06/06/2018  . Tremor     Patient Active Problem List   Diagnosis Date Noted  . Peripheral neuropathy 06/06/2018  . Acute encephalopathy 05/09/2018  . Multiple falls 05/08/2018  . 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 OBSTRUCTIVE SLEEP APNEA 08/20/2007  . Chronic pain syndrome  07/30/2007  . Essential hypertension 07/30/2007  . Ankylosing spondylitis (HCC) 07/30/2007    Past Surgical History:  Procedure Laterality Date  . ANKLE SURGERY    . BACK SURGERY    . JOINT REPLACEMENT    . NECK SURGERY     cervical fusion  . SPINAL CORD STIMULATOR INSERTION  2017  . TOTAL KNEE ARTHROPLASTY          Home Medications    Prior to Admission medications   Medication Sig Start Date End Date Taking? Authorizing Provider  amLODipine (NORVASC) 10 MG tablet Take 10 mg by mouth daily.      [provider]  furosemide (LASIX) 40 MG tablet Take 40 mg by mouth every morning. 08/15/17   [provider]  losartan (COZAAR) 100 MG tablet Take 100 mg by mouth every evening. 08/27/15   [provider]  morphine (MS CONTIN) 30 MG 12 hr tablet Take 30 mg by mouth 3 (three) times daily.    [provider]  naproxen sodium (ALEVE) 220 MG tablet Take 440 mg by mouth daily as needed (for pain).    [provider]  pregabalin (LYRICA) 150 MG capsule Take 150 mg by mouth 3 (three) times daily.      [provider]  sertraline (ZOLOFT) 100 MG tablet Take  100 mg by mouth daily. 01/08/18   [provider]  tamsulosin (FLOMAX) 0.4 MG CAPS capsule Take 1 capsule (0.4 mg total) by mouth daily after supper. 05/14/18   Purohit, Salli Quarry, MD    Family History Family History  Problem Relation Age of Onset  . Heart disease Mother     Social History Social History   Tobacco Use  . Smoking status: Never Smoker  . Smokeless tobacco: Never Used  Substance Use Topics  . Alcohol use: Yes  . Drug use: No     Allergies   Codeine   Review of Systems Review of Systems  Unable to perform ROS: Mental status change     Physical Exam Updated Vital Signs BP (!) 147/86   Pulse (!) 108   Temp 98.6 F (37 C) (Oral)   Resp 19   Wt 102.3 kg   SpO2 95%   BMI 30.59 kg/m    Patient Vitals for the past 24 hrs:  BP Temp Temp src  Pulse Resp SpO2 Weight  07/20/18 1221 - - - (!) 108 (!) 24 92 % -  07/20/18 1220 111/89 - - (!) 105 (!) 24 94 % -  07/20/18 1219 - - - 91 (!) 22 93 % -  07/20/18 1218 93/72 - - 80 (!) 21 94 % -  07/20/18 1200 (!) 92/53 - - - (!) 24 - -  07/20/18 1135 - - - 79 (!) 22 95 % -  07/20/18 1132 (!) 96/46 - - 84 13 93 % -  07/20/18 1130 - - - 86 18 94 % -  07/20/18 1115 - - - 94 16 94 % -  07/20/18 1030 (!) 179/79 - - (!) 126 16 95 % -  07/20/18 1009 - - - (!) 127 (!) 23 94 % -  07/20/18 0956 (!) 147/86 - - - - - -  07/20/18 0955 - - - (!) 108 19 95 % -  07/20/18 0954 - - - (!) 111 16 97 % -  07/20/18 0948 - - - 93 (!) 24 99 % -  07/20/18 0947 - - - (!) 130 (!) 25 98 % -  07/20/18 0946 - - - 89 19 94 % -  07/20/18 0945 - - - - (!) 25 - -  07/20/18 0944 - - - - 20 - -  07/20/18 0943 (!) 138/101 - - - (!) 27 - -  07/20/18 0930 (!) 83/57 - - (!) 52 14 94 % -  07/20/18 0917 (!) 83/56 98.6 F (37 C) Oral (!) 58 - (!) 86 % -  07/20/18 0914 - - - - - - 102.3 kg     Physical Exam 0925: Physical examination:  Nursing notes reviewed; Vital signs and O2 SAT reviewed;  Constitutional: Well developed, Well nourished, In no acute distress; Head:  Normocephalic, +scab mid-forehead.; Eyes: EOMI, PERRL 2mm, No scleral icterus; ENMT: Mouth and pharynx normal, Mucous membranes dry;; Neck: Supple, Full range of motion, No lymphadenopathy; Cardiovascular: Regular rate and rhythm, No gallop; Respiratory: Breath sounds clear & equal bilaterally, No wheezes. Normal respiratory effort/excursion; Chest: Nontender, Movement normal; Abdomen: Soft, Nontender, Nondistended, Normal bowel sounds;; Spine:  No midline CS, TS, LS tenderness.;; Extremities: Pelvis stable. Peripheral pulses normal, No deformity. No edema, No calf edema or asymmetry. +scab right anterior tibial area..; Neuro: Somnolent.  Eyes open. Speech slurred. No facial droop. Tremors bilat UE's. Pt will lift bilat UE's and LE's up off stretcher spontaneously  and to  command, strength 5/5. No apparent gross focal motor deficits in extremities.; Skin: Color normal, Warm, Dry.     ED Treatments / Results  Labs (all labs ordered are listed, but only abnormal results are displayed)   EKG EKG Interpretation  Date/Time:  Friday July 20 2018 09:22:19 EST Ventricular Rate:  61 PR Interval:    QRS Duration: 109 QT Interval:  421 QTC Calculation: 424 R Axis:   64 Text Interpretation:  Sinus rhythm Short PR interval Consider left atrial enlargement Low voltage, precordial leads Baseline wander Artifact When compared with ECG of 05/08/2018 Artifact is now Present otherwise no obvious significant changes  Confirmed by Samuel Jester [phone]) on 07/20/2018 10:01:40 AM   Radiology   Procedures Procedures (including critical care time)  Medications Ordered in ED Medications  sodium chloride 0.9 % bolus 1,000 mL (1,000 mLs Intravenous New Bag/Given 07/20/18 0933)  thiamine (B-1) injection 100 mg (has no administration in time range)  naloxone [facility]) injection 0.4 mg (0.4 mg Intravenous Given 07/20/18 0942)     Initial Impression / Assessment and Plan / ED Course  I have reviewed the triage vital signs and the nursing notes.  Pertinent labs & imaging results that were available during my care of the patient were reviewed by me and considered in my medical decision making (see chart for details).  MDM Reviewed: previous chart, nursing note and vitals Reviewed previous: labs and ECG Interpretation: labs, ECG, x-ray and CT scan Total time providing critical care: 30-74 minutes. This excludes time spent performing separately reportable procedures and services. Consults: admitting MD    CRITICAL CARE Performed by: Samuel Jester Total critical care time: 45 minutes Critical care time was exclusive of separately billable procedures and treating other patients. Critical care was necessary to treat or prevent imminent or life-threatening  deterioration. Critical care was time spent personally by me on the following activities: development of treatment plan with patient and/or surrogate as well as nursing, discussions with consultants, evaluation of patient's response to treatment, examination of patient, obtaining history from patient or surrogate, ordering and performing treatments and interventions, ordering and review of laboratory studies, ordering and review of radiographic studies, pulse oximetry and re-evaluation of patient's condition.   Results for orders placed or performed during the hospital encounter of 07/20/18  Comprehensive metabolic panel  Result Value Ref Range   Sodium 134 (L) 135 - 145 mmol/L   Potassium 3.9 3.5 - 5.1 mmol/L   Chloride 97 (L) 98 - 111 mmol/L   CO2 25 22 - 32 mmol/L   Glucose, Bld 116 (H) 70 - 99 mg/dL   BUN 59 (H) 8 - 23 mg/dL   Creatinine, Ser 4.69 (H) 0.61 - 1.24 mg/dL   Calcium 8.9 8.9 - 62.9 mg/dL   Total Protein 7.6 6.5 - 8.1 g/dL   Albumin 3.8 3.5 - 5.0 g/dL   AST 19 15 - 41 U/L   ALT 23 0 - 44 U/L   Alkaline Phosphatase 130 (H) 38 - 126 U/L   Total Bilirubin 0.6 0.3 - 1.2 mg/dL   GFR calc non Af Amer 15 (L) >60 mL/min   GFR calc Af Amer 18 (L) >60 mL/min   Anion gap 12 5 - 15  Troponin I - Once  Result Value Ref Range   Troponin I 0.04 (HH) <0.03 ng/mL  CBC with Differential  Result Value Ref Range   WBC 9.6 4.0 - 10.5 K/uL   RBC 4.22 4.22 - 5.81 MIL/uL   Hemoglobin  12.4 (L) 13.0 - 17.0 g/dL   HCT 16.137.6 (L) 09.639.0 - 04.552.0 %   MCV 89.1 80.0 - 100.0 fL   MCH 29.4 26.0 - 34.0 pg   MCHC 33.0 30.0 - 36.0 g/dL   RDW 40.913.4 81.111.5 - 91.415.5 %   Platelets 243 150 - 400 K/uL   nRBC 0.0 0.0 - 0.2 %   Neutrophils Relative % 71 %   Neutro Abs 6.7 1.7 - 7.7 K/uL   Lymphocytes Relative 21 %   Lymphs Abs 2.0 0.7 - 4.0 K/uL   Monocytes Relative 7 %   Monocytes Absolute 0.7 0.1 - 1.0 K/uL   Eosinophils Relative 1 %   Eosinophils Absolute 0.1 0.0 - 0.5 K/uL   Basophils Relative 0 %    Basophils Absolute 0.0 0.0 - 0.1 K/uL   Immature Granulocytes 0 %   Abs Immature Granulocytes 0.02 0.00 - 0.07 K/uL  Protime-INR  Result Value Ref Range   Prothrombin Time 14.6 11.4 - 15.2 seconds   INR 1.15   Urine rapid drug screen (hosp performed)  Result Value Ref Range   Opiates POSITIVE (A) NONE DETECTED   Cocaine NONE DETECTED NONE DETECTED   Benzodiazepines NONE DETECTED NONE DETECTED   Amphetamines NONE DETECTED NONE DETECTED   Tetrahydrocannabinol NONE DETECTED NONE DETECTED   Barbiturates NONE DETECTED NONE DETECTED  CBG monitoring, ED  Result Value Ref Range   Glucose-Capillary 115 (H) 70 - 99 mg/dL    Dg Chest 1 View Result Date: 07/20/2018 CLINICAL DATA:  Fall, altered mental status. EXAM: CHEST  1 VIEW COMPARISON:  Radiographs of February 14, 2018. FINDINGS: Stable cardiomegaly. No pneumothorax or pleural effusion is noted. Minimal bibasilar subsegmental atelectasis is noted. Old right rib fractures are noted. IMPRESSION: Minimal bibasilar subsegmental atelectasis. Electronically Signed   By: Lupita RaiderJames  Green Jr, M.D.   On: 07/20/2018 10:36   Dg Lumbar Spine Complete Result Date: 07/20/2018 CLINICAL DATA:  Multiple falls. EXAM: LUMBAR SPINE - COMPLETE 4+ VIEW COMPARISON:  Radiographs of February 21, 2018. FINDINGS: No fracture or spondylolisthesis is noted. Large anterior osteophyte formation is noted at L1-2, L3-4 and L5-S1. Moderate degenerative disc disease is noted at L5-S1. Atherosclerosis of abdominal aorta is noted. IMPRESSION: Degenerative changes as described above. No acute abnormality seen in the lumbar spine. Aortic Atherosclerosis (ICD10-I70.0). Electronically Signed   By: Lupita RaiderJames  Green Jr, M.D.   On: 07/20/2018 10:38   Ct Head Wo Contrast Result Date: 07/20/2018 CLINICAL DATA:  73 year old male with history of trauma from a fall 2 days ago with injury to the head. Additional fall yesterday without injury to the head. EXAM: CT HEAD WITHOUT CONTRAST CT CERVICAL SPINE  WITHOUT CONTRAST TECHNIQUE: Multidetector CT imaging of the head and cervical spine was performed following the standard protocol without intravenous contrast. Multiplanar CT image reconstructions of the cervical spine were also generated. COMPARISON:  Head CT and cervical spine CT 05/08/2018. FINDINGS: CT HEAD FINDINGS Brain: Mild cerebral atrophy with associated ex vacuo dilatation of the ventricular system. Patchy and confluent areas of decreased attenuation are noted throughout the deep and periventricular white matter of the cerebral hemispheres bilaterally, compatible with chronic microvascular ischemic disease. No evidence of acute infarction, hemorrhage, hydrocephalus, extra-axial collection or mass lesion/mass effect. Vascular: No hyperdense vessel or unexpected calcification. Skull: Normal. Negative for fracture or focal lesion. Sinuses/Orbits: No acute finding. Other: Orthopedic fixation hardware associated with the occipital bone. CT CERVICAL SPINE FINDINGS Alignment: Mild straightening of normal cervical lordosis, similar to the prior study.  Skull base and vertebrae: Postoperative changes of fusion of the occiput through C4. No acute hardware complications. Known fracture of C6 which is obliquely oriented extending from the superior aspect of the anterior vertebral body through the inferior aspect of the posterior vertebral body, as well as into the right inferior C6 facet and the C6-C7 facet joint. Compared to the prior examination, there is increasing loss of central vertebral body height (approximately 25%), and increasing sclerosis in the vertebral body related to normal healing. No new acute displaced fracture is otherwise noted. Soft tissues and spinal canal: No prevertebral fluid or swelling. No visible canal hematoma. Disc levels: Multilevel degenerative disc disease again noted, most severe at C6-C7. Extensive anterior osteophytosis throughout the cervical spine, with fracture at C6 extending  through the anterior osteophytes. Multilevel facet arthropathy. Upper chest: Unremarkable. Other: None. IMPRESSION: 1. No acute displaced skull fracture or signs of significant acute intracranial trauma. 2. Old fracture of C6 demonstrates evidence of some healing and some mild compression compared to the prior study. No new acute findings are noted elsewhere in the cervical spine. 3. Postoperative changes of occipital-cervical fusion redemonstrated, without acute complicating features, as above. 4. Mild cerebral atrophy with chronic microvascular ischemic changes in the cerebral white matter, as above. 5. Multilevel degenerative disc disease and cervical spondylosis, as above. Electronically Signed   By: Trudie Reedaniel  Entrikin M.D.   On: 07/20/2018 10:59   Ct Cervical Spine Wo Contrast Result Date: 07/20/2018 CLINICAL DATA:  73 year old male with history of trauma from a fall 2 days ago with injury to the head. Additional fall yesterday without injury to the head. EXAM: CT HEAD WITHOUT CONTRAST CT CERVICAL SPINE WITHOUT CONTRAST TECHNIQUE: Multidetector CT imaging of the head and cervical spine was performed following the standard protocol without intravenous contrast. Multiplanar CT image reconstructions of the cervical spine were also generated. COMPARISON:  Head CT and cervical spine CT 05/08/2018. FINDINGS: CT HEAD FINDINGS Brain: Mild cerebral atrophy with associated ex vacuo dilatation of the ventricular system. Patchy and confluent areas of decreased attenuation are noted throughout the deep and periventricular white matter of the cerebral hemispheres bilaterally, compatible with chronic microvascular ischemic disease. No evidence of acute infarction, hemorrhage, hydrocephalus, extra-axial collection or mass lesion/mass effect. Vascular: No hyperdense vessel or unexpected calcification. Skull: Normal. Negative for fracture or focal lesion. Sinuses/Orbits: No acute finding. Other: Orthopedic fixation hardware  associated with the occipital bone. CT CERVICAL SPINE FINDINGS Alignment: Mild straightening of normal cervical lordosis, similar to the prior study. Skull base and vertebrae: Postoperative changes of fusion of the occiput through C4. No acute hardware complications. Known fracture of C6 which is obliquely oriented extending from the superior aspect of the anterior vertebral body through the inferior aspect of the posterior vertebral body, as well as into the right inferior C6 facet and the C6-C7 facet joint. Compared to the prior examination, there is increasing loss of central vertebral body height (approximately 25%), and increasing sclerosis in the vertebral body related to normal healing. No new acute displaced fracture is otherwise noted. Soft tissues and spinal canal: No prevertebral fluid or swelling. No visible canal hematoma. Disc levels: Multilevel degenerative disc disease again noted, most severe at C6-C7. Extensive anterior osteophytosis throughout the cervical spine, with fracture at C6 extending through the anterior osteophytes. Multilevel facet arthropathy. Upper chest: Unremarkable. Other: None. IMPRESSION: 1. No acute displaced skull fracture or signs of significant acute intracranial trauma. 2. Old fracture of C6 demonstrates evidence of some healing and some mild  compression compared to the prior study. No new acute findings are noted elsewhere in the cervical spine. 3. Postoperative changes of occipital-cervical fusion redemonstrated, without acute complicating features, as above. 4. Mild cerebral atrophy with chronic microvascular ischemic changes in the cerebral white matter, as above. 5. Multilevel degenerative disc disease and cervical spondylosis, as above. Electronically Signed   By: Trudie Reed M.D.   On: 07/20/2018 10:59    Results for KAEVION, SINCLAIR" (MRN [id number]) as of 07/20/2018 10:25  Ref. Range 05/09/2018 05:04 05/10/2018 05:24 07/20/2018 09:28  BUN Latest Ref  Range: 8 - 23 mg/dL 25 (H) 22 59 (H)  Creatinine Latest Ref Range: 0.61 - 1.24 mg/dL 7.82 (H) 9.56 2.13 (H)     0945:  Pt somnolent on arrival, pupils pinpoint. O2 Sats and BP low. IVF bolus ordered and O2 N/C placed. Pt with significant hx opiate use. IV narcan given with good effect. Pt now awake/alert, interactive with ED staff and wife at bedside. BP and O2 Sats improved. NAD, resps easy, speech clear, moves all extremities spontaneously and to command without apparent gross focal motor deficits.   1145:  BUN/Cr elevated; judicious IVF given. Pt's wife believes pt does have "some pills left" of his MS Contin and knew he was "taking one a day, trying to wean himself off of it."  Dx and testing d/w pt and family.  Questions answered.  Verb understanding, agreeable to admit.  1215:  Pt's BP dropping, becoming somnolent again. IV narcan given with good effect. Page to [facility] Dr. Kerry Hough with update.        Final Clinical Impressions(s) / ED Diagnoses   Final diagnoses:  None    ED Discharge Orders    None       Samuel Jester, DO 07/21/18 0865

## 2018-07-20 NOTE — H&P (Signed)
History and Physical    [name] ZOX:[id number] DOB: March 16, 1946 DOA: 07/20/2018  PCP: Vivien Prestoorrington, Kip A, MD  Patient coming from: home  I have personally briefly reviewed patient's old medical records in [facility]  Chief Complaint: altered mental status  HPI: [name] is a 73 y.o. male with medical history significant of  Chronic pain syndrome, HTN, multiple falls, peripheral neuropathy, who was admitted in 05/2018 for frequent falls which was felt at that time to be related to alcohol abuse.  He suffered a C6 fracture at that time and was discharged with a cervical collar.  He has since followed up with neurosurgery and cervical collar has been discontinued.  After that last admission, he went to a skilled nursing facility for physical therapy and wife reports that he was doing well upon return home.  He is followed at a pain clinic and recently his opiates were discontinued.  Patient is still drowsy at this time and is unable to provide history.  His wife reports that he continue to take reportedly 1 tablet of MS Contin daily from his remaining prescription.  She has noted over the past week, he has been increasingly lethargic, weak.  He has had decreased p.o. intake.  He has been confused.  He is not had any fever, diarrhea or vomiting.  No complaints of chest pain or shortness of breath.  He is also been falling again lately, but is pretty much been in bed for the last week.  She feels that he sleeps 20 out of 24 hours.  ED Course: Patient was noted to be lethargic on arrival.  He received a dose of Narcan which improved his mental status.  Labs revealed an acute kidney injury with elevated BUN/creatinine.  Urinalysis indicated increased WBCs but no other signs of infection.  Blood pressure was initially low and he was hypoxic on arrival, these vital signs improved after administration of Narcan.  Imaging including CT head, CT C-spine, lumbar x-ray and chest x-ray did  not show any acute findings.  After few hours in the emergency room, he was noted to be become drowsy again and received another dose of Narcan with improvement.  Review of Systems: As per HPI otherwise 10 point review of systems negative.    Past Medical History:  Diagnosis Date  . 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 Ankylosing spondylitis of multiple sites in spine (HCC)   . Chronic pain   . Chronically on opiate therapy    Pain Management d/c opiates 06/2018  . First degree AV block    chronic  . Gait instability   . Hypertension   . Multiple falls   . OSA (obstructive sleep apnea)   . Pain management   . Peripheral axonal neuropathy   . Peripheral neuropathy 06/06/2018  . Tremor     Past Surgical History:  Procedure Laterality Date  . ANKLE SURGERY    . BACK SURGERY    . JOINT REPLACEMENT    . NECK SURGERY     cervical fusion  . SPINAL CORD STIMULATOR INSERTION  2017  . TOTAL KNEE ARTHROPLASTY      Social History:  reports that he has never smoked. He has never used smokeless tobacco. He reports current alcohol use. He reports that he does not use drugs.  Allergies  Allergen Reactions  . Codeine Nausea Only    Family History  Problem Relation Age of Onset  . Heart disease Mother  Prior to Admission medications   Medication Sig Start Date End Date Taking? Authorizing Provider  amLODipine (NORVASC) 10 MG tablet Take 10 mg by mouth daily.     Yes [provider]  cloNIDine (CATAPRES - DOSED IN MG/24 HR) 0.2 mg/24hr patch Place 0.2 mg onto the skin once a week.  06/20/18  Yes [provider]  furosemide (LASIX) 40 MG tablet Take 40 mg by mouth every morning. 08/15/17  Yes [provider]  losartan (COZAAR) 100 MG tablet Take 100 mg by mouth every evening. 08/27/15  Yes [provider]  morphine (MS CONTIN) 30 MG 12 hr tablet Take 30 mg by mouth daily.    Yes [provider]  naproxen sodium (ALEVE) 220 MG tablet Take 440  mg by mouth daily as needed (for pain).   Yes [provider]  pregabalin (LYRICA) 150 MG capsule Take 150 mg by mouth 3 (three) times daily.     Yes [provider]  sertraline (ZOLOFT) 100 MG tablet Take 100 mg by mouth daily. 01/08/18  Yes [provider]  tamsulosin (FLOMAX) 0.4 MG CAPS capsule Take 1 capsule (0.4 mg total) by mouth daily after supper. 05/14/18  Yes Purohit, Salli Quarry, MD    Physical Exam: Vitals:   07/20/18 1437 07/20/18 1438 07/20/18 1439 07/20/18 1530  BP:  119/63  97/81  Pulse: (!) 103 (!) 110 100 79  Resp: 13 15 14 18   Temp:    98.9 F (37.2 C)  TempSrc:    Oral  SpO2: 95% 91% 93% 97%  Weight:        Constitutional: NAD, calm, comfortable Eyes: pupils are pinpoint bilaterally, lids and conjunctivae normal ENMT: Mucous membranes are moist. Posterior pharynx clear of any exudate or lesions.Normal dentition.  Neck: normal, supple, no masses, no thyromegaly Respiratory: clear to auscultation bilaterally, no wheezing, no crackles. Normal respiratory effort. No accessory muscle use.  Cardiovascular: Regular rate and rhythm, no murmurs / rubs / gallops. No extremity edema. 2+ pedal pulses. No carotid bruits.  Abdomen: no tenderness, no masses palpated. No hepatosplenomegaly. Bowel sounds positive.  Musculoskeletal: no clubbing / cyanosis. No joint deformity upper and lower extremities. Good ROM, no contractures. Normal muscle tone.  Skin: no rashes, lesions, ulcers. No induration Neurologic: cranial nerves are grossly intact. No gross focal deficits.  Psychiatric: lethargic    Labs on Admission: I have personally reviewed following labs and imaging studies  CBC: Recent Labs  Lab 07/20/18 0928  WBC 9.6  NEUTROABS 6.7  HGB 12.4*  HCT 37.6*  MCV 89.1  PLT 243   Basic Metabolic Panel: Recent Labs  Lab 07/20/18 0928  NA 134*  K 3.9  CL 97*  CO2 25  GLUCOSE 116*  BUN 59*  CREATININE 3.73*  CALCIUM 8.9   GFR: Estimated  Creatinine Clearance: 22.8 mL/min (A) (by C-G formula based on SCr of 3.73 mg/dL (H)). Liver Function Tests: Recent Labs  Lab 07/20/18 0928  AST 19  ALT 23  ALKPHOS 130*  BILITOT 0.6  PROT 7.6  ALBUMIN 3.8   No results for input(s): LIPASE, AMYLASE in the last 168 hours. Recent Labs  Lab 07/20/18 0954  AMMONIA 31   Coagulation Profile: Recent Labs  Lab 07/20/18 0928  INR 1.15   Cardiac Enzymes: Recent Labs  Lab 07/20/18 0928  CKTOTAL 189  TROPONINI 0.04*   BNP (last 3 results) No results for input(s): PROBNP in the last 8760 hours. HbA1C: No results for input(s): HGBA1C in  the last 72 hours. CBG: Recent Labs  Lab 07/20/18 0917  GLUCAP 115*   Lipid Profile: No results for input(s): CHOL, HDL, LDLCALC, TRIG, CHOLHDL, LDLDIRECT in the last 72 hours. Thyroid Function Tests: No results for input(s): TSH, T4TOTAL, FREET4, T3FREE, THYROIDAB in the last 72 hours. Anemia Panel: No results for input(s): VITAMINB12, FOLATE, FERRITIN, TIBC, IRON, RETICCTPCT in the last 72 hours. Urine analysis:    Component Value Date/Time   COLORURINE YELLOW 07/20/2018 0928   APPEARANCEUR CLEAR 07/20/2018 0928   LABSPEC 1.012 07/20/2018 0928   PHURINE 5.0 07/20/2018 0928   GLUCOSEU NEGATIVE 07/20/2018 0928   HGBUR MODERATE (A) 07/20/2018 0928   BILIRUBINUR NEGATIVE 07/20/2018 0928   KETONESUR NEGATIVE 07/20/2018 0928   PROTEINUR NEGATIVE 07/20/2018 0928   UROBILINOGEN 0.2 02/28/2012 1652   NITRITE NEGATIVE 07/20/2018 0928   LEUKOCYTESUR MODERATE (A) 07/20/2018 0928    Radiological Exams on Admission: Dg Chest 1 View  Result Date: 07/20/2018 CLINICAL DATA:  Fall, altered mental status. EXAM: CHEST  1 VIEW COMPARISON:  Radiographs of February 14, 2018. FINDINGS: Stable cardiomegaly. No pneumothorax or pleural effusion is noted. Minimal bibasilar subsegmental atelectasis is noted. Old right rib fractures are noted. IMPRESSION: Minimal bibasilar subsegmental atelectasis.  Electronically Signed   By: Lupita Raider, M.D.   On: 07/20/2018 10:36   Dg Lumbar Spine Complete  Result Date: 07/20/2018 CLINICAL DATA:  Multiple falls. EXAM: LUMBAR SPINE - COMPLETE 4+ VIEW COMPARISON:  Radiographs of February 21, 2018. FINDINGS: No fracture or spondylolisthesis is noted. Large anterior osteophyte formation is noted at L1-2, L3-4 and L5-S1. Moderate degenerative disc disease is noted at L5-S1. Atherosclerosis of abdominal aorta is noted. IMPRESSION: Degenerative changes as described above. No acute abnormality seen in the lumbar spine. Aortic Atherosclerosis (ICD10-I70.0). Electronically Signed   By: Lupita Raider, M.D.   On: 07/20/2018 10:38   Ct Head Wo Contrast  Result Date: 07/20/2018 CLINICAL DATA:  73 year old male with history of trauma from a fall 2 days ago with injury to the head. Additional fall yesterday without injury to the head. EXAM: CT HEAD WITHOUT CONTRAST CT CERVICAL SPINE WITHOUT CONTRAST TECHNIQUE: Multidetector CT imaging of the head and cervical spine was performed following the standard protocol without intravenous contrast. Multiplanar CT image reconstructions of the cervical spine were also generated. COMPARISON:  Head CT and cervical spine CT 05/08/2018. FINDINGS: CT HEAD FINDINGS Brain: Mild cerebral atrophy with associated ex vacuo dilatation of the ventricular system. Patchy and confluent areas of decreased attenuation are noted throughout the deep and periventricular white matter of the cerebral hemispheres bilaterally, compatible with chronic microvascular ischemic disease. No evidence of acute infarction, hemorrhage, hydrocephalus, extra-axial collection or mass lesion/mass effect. Vascular: No hyperdense vessel or unexpected calcification. Skull: Normal. Negative for fracture or focal lesion. Sinuses/Orbits: No acute finding. Other: Orthopedic fixation hardware associated with the occipital bone. CT CERVICAL SPINE FINDINGS Alignment: Mild straightening  of normal cervical lordosis, similar to the prior study. Skull base and vertebrae: Postoperative changes of fusion of the occiput through C4. No acute hardware complications. Known fracture of C6 which is obliquely oriented extending from the superior aspect of the anterior vertebral body through the inferior aspect of the posterior vertebral body, as well as into the right inferior C6 facet and the C6-C7 facet joint. Compared to the prior examination, there is increasing loss of central vertebral body height (approximately 25%), and increasing sclerosis in the vertebral body related to normal healing. No new acute displaced fracture is otherwise  noted. Soft tissues and spinal canal: No prevertebral fluid or swelling. No visible canal hematoma. Disc levels: Multilevel degenerative disc disease again noted, most severe at C6-C7. Extensive anterior osteophytosis throughout the cervical spine, with fracture at C6 extending through the anterior osteophytes. Multilevel facet arthropathy. Upper chest: Unremarkable. Other: None. IMPRESSION: 1. No acute displaced skull fracture or signs of significant acute intracranial trauma. 2. Old fracture of C6 demonstrates evidence of some healing and some mild compression compared to the prior study. No new acute findings are noted elsewhere in the cervical spine. 3. Postoperative changes of occipital-cervical fusion redemonstrated, without acute complicating features, as above. 4. Mild cerebral atrophy with chronic microvascular ischemic changes in the cerebral white matter, as above. 5. Multilevel degenerative disc disease and cervical spondylosis, as above. Electronically Signed   By: Trudie Reedaniel  Entrikin M.D.   On: 07/20/2018 10:59   Ct Cervical Spine Wo Contrast  Result Date: 07/20/2018 CLINICAL DATA:  73 year old male with history of trauma from a fall 2 days ago with injury to the head. Additional fall yesterday without injury to the head. EXAM: CT HEAD WITHOUT CONTRAST CT  CERVICAL SPINE WITHOUT CONTRAST TECHNIQUE: Multidetector CT imaging of the head and cervical spine was performed following the standard protocol without intravenous contrast. Multiplanar CT image reconstructions of the cervical spine were also generated. COMPARISON:  Head CT and cervical spine CT 05/08/2018. FINDINGS: CT HEAD FINDINGS Brain: Mild cerebral atrophy with associated ex vacuo dilatation of the ventricular system. Patchy and confluent areas of decreased attenuation are noted throughout the deep and periventricular white matter of the cerebral hemispheres bilaterally, compatible with chronic microvascular ischemic disease. No evidence of acute infarction, hemorrhage, hydrocephalus, extra-axial collection or mass lesion/mass effect. Vascular: No hyperdense vessel or unexpected calcification. Skull: Normal. Negative for fracture or focal lesion. Sinuses/Orbits: No acute finding. Other: Orthopedic fixation hardware associated with the occipital bone. CT CERVICAL SPINE FINDINGS Alignment: Mild straightening of normal cervical lordosis, similar to the prior study. Skull base and vertebrae: Postoperative changes of fusion of the occiput through C4. No acute hardware complications. Known fracture of C6 which is obliquely oriented extending from the superior aspect of the anterior vertebral body through the inferior aspect of the posterior vertebral body, as well as into the right inferior C6 facet and the C6-C7 facet joint. Compared to the prior examination, there is increasing loss of central vertebral body height (approximately 25%), and increasing sclerosis in the vertebral body related to normal healing. No new acute displaced fracture is otherwise noted. Soft tissues and spinal canal: No prevertebral fluid or swelling. No visible canal hematoma. Disc levels: Multilevel degenerative disc disease again noted, most severe at C6-C7. Extensive anterior osteophytosis throughout the cervical spine, with fracture at  C6 extending through the anterior osteophytes. Multilevel facet arthropathy. Upper chest: Unremarkable. Other: None. IMPRESSION: 1. No acute displaced skull fracture or signs of significant acute intracranial trauma. 2. Old fracture of C6 demonstrates evidence of some healing and some mild compression compared to the prior study. No new acute findings are noted elsewhere in the cervical spine. 3. Postoperative changes of occipital-cervical fusion redemonstrated, without acute complicating features, as above. 4. Mild cerebral atrophy with chronic microvascular ischemic changes in the cerebral white matter, as above. 5. Multilevel degenerative disc disease and cervical spondylosis, as above. Electronically Signed   By: Trudie Reedaniel  Entrikin M.D.   On: 07/20/2018 10:59    EKG: Independently reviewed. Sinus rhythm without acute changes  Assessment/Plan Active Problems:   Chronic pain syndrome  Essential hypertension   Ankylosing spondylitis (HCC)   Acute encephalopathy   Acute kidney injury (HCC)   Dehydration   Opiate overdose (HCC)     1. Acute encephalopathy.  Likely related to opiate use in the setting of renal failure.  He did respond to doses of Narcan.  At this point, he is awake, but is somewhat confused and drowsy.  We will continue to monitor.  Can consider repeat Narcan dosing if his mental status worsens.  Of note, he is on several other sedative medications including high dose of Lyrica.  He does not have any focal deficits at this time.  Check ammonia and ABG. 2. Acute kidney injury.  Likely related to decreased p.o. intake, ARB use and Lasix use.  Will hold nephrotoxic agents and provide gentle hydration. 3. Opiate overdose, appears to be unintentional.  Will hold further opiate medications and provide Narcan as needed. 4. Hypertension.  Holding losartan/Lasix/amlodipine since his blood pressure was borderline on admission.  Continue IV hydration. 5. Chronic pain syndrome.  Will need to  follow-up with his pain clinic.  Will discontinue further opiates.  DVT prophylaxis: lovenox  Code Status: full code  Family Communication: discussed with wife at the bedside  Disposition Plan: discharge home once mental status and renal function have improved, pending physical therapy evaluation  Consults called:   Admission status: inpatient, telemetry   Erick Blinks MD [facility]   If 7PM-7AM, please contact night-coverage [email or web address]   07/20/2018, 5:55 PM

## 2018-07-20 NOTE — ED Triage Notes (Signed)
Pt has a history of falling. Fell 2 days ago hitting his head. Was normal after this fall. Fell yesterday again, but did not hit his head. LKW was 1700 yesterday. Altered at triage. Pt is normally able to carry on a normal conversation. No blood thinners noted.

## 2018-07-20 NOTE — ED Notes (Signed)
Pt taken to ct 

## 2018-07-20 NOTE — ED Notes (Signed)
Pt is awake, alert and agitated after narcan injection.  Pt wife states pain clinic is trying to get him off of morphine and put him on a clonidine patch.  No clonidine patch is on patient at this time.  Pt wife states pt had some morphine at home but is unsure of the amount he had.

## 2018-07-20 NOTE — ED Notes (Signed)
CRITICAL VALUE ALERT  Critical Value: trop 0.04  Date & Time Notied:  07/20/18 1024  Provider Notified: Dr Richrd Prime  Orders Received/Actions taken:

## 2018-07-21 ENCOUNTER — Inpatient Hospital Stay (HOSPITAL_COMMUNITY): Payer: Medicare Other

## 2018-07-21 DIAGNOSIS — J9601 Acute respiratory failure with hypoxia: Secondary | ICD-10-CM | POA: Diagnosis present

## 2018-07-21 DIAGNOSIS — J690 Pneumonitis due to inhalation of food and vomit: Secondary | ICD-10-CM | POA: Diagnosis present

## 2018-07-21 LAB — COMPREHENSIVE METABOLIC PANEL
ALK PHOS: 128 U/L — AB (ref 38–126)
ALT: 25 U/L (ref 0–44)
ANION GAP: 9 (ref 5–15)
AST: 26 U/L (ref 15–41)
Albumin: 3.4 g/dL — ABNORMAL LOW (ref 3.5–5.0)
BUN: 45 mg/dL — ABNORMAL HIGH (ref 8–23)
CALCIUM: 8.5 mg/dL — AB (ref 8.9–10.3)
CO2: 23 mmol/L (ref 22–32)
Chloride: 109 mmol/L (ref 98–111)
Creatinine, Ser: 2.21 mg/dL — ABNORMAL HIGH (ref 0.61–1.24)
GFR calc Af Amer: 34 mL/min — ABNORMAL LOW (ref >60)
GFR calc non Af Amer: 29 mL/min — ABNORMAL LOW (ref >60)
Glucose, Bld: 110 mg/dL — ABNORMAL HIGH (ref 70–99)
Potassium: 3.7 mmol/L (ref 3.5–5.1)
Sodium: 141 mmol/L (ref 135–145)
TOTAL PROTEIN: 6.9 g/dL (ref 6.5–8.1)
Total Bilirubin: 1.3 mg/dL — ABNORMAL HIGH (ref 0.3–1.2)

## 2018-07-21 LAB — CBC
HCT: 38.6 % — ABNORMAL LOW (ref 39.0–52.0)
Hemoglobin: 12.4 g/dL — ABNORMAL LOW (ref 13.0–17.0)
MCH: 29.7 pg (ref 26.0–34.0)
MCHC: 32.1 g/dL (ref 30.0–36.0)
MCV: 92.3 fL (ref 80.0–100.0)
PLATELETS: 215 10*3/uL (ref 150–400)
RBC: 4.18 MIL/uL — ABNORMAL LOW (ref 4.22–5.81)
RDW: 13.4 % (ref 11.5–15.5)
WBC: 12 10*3/uL — ABNORMAL HIGH (ref 4.0–10.5)
nRBC: 0 % (ref 0.0–0.2)

## 2018-07-21 LAB — URINE CULTURE: Culture: NO GROWTH

## 2018-07-21 LAB — BLOOD GAS, ARTERIAL
Acid-base deficit: 1.8 mmol/L (ref 0.0–2.0)
Bicarbonate: 22.2 mmol/L (ref 20.0–28.0)
O2 Content: 2 L/min
O2 Saturation: 95.3 %
Patient temperature: 37
pCO2 arterial: 52.4 mmHg — ABNORMAL HIGH (ref 32.0–48.0)
pH, Arterial: 7.282 — ABNORMAL LOW (ref 7.350–7.450)
pO2, Arterial: 84.2 mmHg (ref 83.0–108.0)

## 2018-07-21 LAB — GLUCOSE, CAPILLARY: Glucose-Capillary: 129 mg/dL — ABNORMAL HIGH (ref 70–99)

## 2018-07-21 LAB — BRAIN NATRIURETIC PEPTIDE: B Natriuretic Peptide: 701 pg/mL — ABNORMAL HIGH (ref 0.0–100.0)

## 2018-07-21 LAB — INFLUENZA PANEL BY PCR (TYPE A & B)
Influenza A By PCR: NEGATIVE
Influenza B By PCR: NEGATIVE

## 2018-07-21 MED ORDER — PIPERACILLIN-TAZOBACTAM 3.375 G IVPB
3.3750 g | Freq: Once | INTRAVENOUS | Status: AC
  Administered 2018-07-21: 3.375 g via INTRAVENOUS
  Filled 2018-07-21: qty 50

## 2018-07-21 MED ORDER — PIPERACILLIN-TAZOBACTAM 3.375 G IVPB
3.3750 g | Freq: Three times a day (TID) | INTRAVENOUS | Status: DC
  Administered 2018-07-22 – 2018-07-25 (×11): 3.375 g via INTRAVENOUS
  Filled 2018-07-21 (×11): qty 50

## 2018-07-21 MED ORDER — IPRATROPIUM-ALBUTEROL 0.5-2.5 (3) MG/3ML IN SOLN
3.0000 mL | Freq: Four times a day (QID) | RESPIRATORY_TRACT | Status: DC
  Administered 2018-07-21 – 2018-07-23 (×7): 3 mL via RESPIRATORY_TRACT
  Filled 2018-07-21 (×7): qty 3

## 2018-07-21 MED ORDER — IPRATROPIUM-ALBUTEROL 0.5-2.5 (3) MG/3ML IN SOLN
3.0000 mL | Freq: Four times a day (QID) | RESPIRATORY_TRACT | Status: DC | PRN
  Administered 2018-07-21: 3 mL via RESPIRATORY_TRACT

## 2018-07-21 MED ORDER — ALBUTEROL SULFATE (2.5 MG/3ML) 0.083% IN NEBU: 2.5000 mg | INHALATION_SOLUTION | RESPIRATORY_TRACT | Status: DC | PRN

## 2018-07-21 MED ORDER — SODIUM CHLORIDE 0.9 % IV SOLN
INTRAVENOUS | Status: DC
  Administered 2018-07-21 – 2018-07-22 (×2): via INTRAVENOUS

## 2018-07-21 NOTE — Progress Notes (Signed)
Patient congested. No cough noted. O2 sat 87% on HFNC. Patient instructed to sit upright and cough. Nonproductive. Oxygen increased to 3L. O2 sat 91%. PTA meds list that patient does take lasix at home. Dr. Sharl Ma notified. Ordered to discontinue fluids and begin neb treatments. Will continue to monitor.

## 2018-07-21 NOTE — Progress Notes (Signed)
PROGRESS NOTE    [name]  ZOX:[id number] DOB: August 31, 1945 DOA: 07/20/2018 PCP: Vivien Prestoorrington, Kip A, MD    Brief Narrative:  73 year old male with a history of chronic pain syndrome, hypertension, multiple falls, brought to the hospital with worsening mental status, decreased p.o. intake and repeated falls.  He was found to have dehydration and acute renal failure.  It was felt the change in mental status likely related to medications in the setting of renal failure.  Hospital course has been complicated by development of fever and chest x-ray indicating developing pneumonia.  He has been started on intravenous antibiotics.   Assessment & Plan:   Active Problems:   Chronic pain syndrome   Essential hypertension   Ankylosing spondylitis (HCC)   Acute encephalopathy   Acute kidney injury (HCC)   Dehydration   Opiate overdose (HCC)   1. Acute encephalopathy.  Related to opiate use as well as other sedative medications in the setting of renal failure.  He initially did respond to Narcan in the emergency room.  At this time, he is awake, but still confused and drowsy.  Continue to follow.  Hold all sedative medications.  CT imaging of the head and neck on admission was unrevealing. 2. Acute kidney injury.  Likely related to dehydration, ARB use and Lasix use.  Nephrotoxic agents currently on hold.  We will continue gentle hydration. 3. Unintentional opiate overdose.  The patient takes MS Contin amongst other sedatives.  Overall mental status is slowly improving.  Continue to monitor. 4. Acute respiratory failure.  Likely related to developing pneumonia.  He is on supplemental oxygen.  We will try and wean off as tolerated. 5. Pneumonia, likely aspiration related.  Patient had high fever earlier today.  He is more rhonchorous.  Chest x-ray indicates developing pneumonia.  Will start on Zosyn.  Check influenza panel.  Blood cultures have been sent. 6. Hypertension.  Holding  losartan/Lasix/amlodipine.  Continue hydration.  Blood pressures were soft on admission. 7. Chronic pain syndrome.  He will need to follow-up with his pain clinic.   DVT prophylaxis: Lovenox Code Status: Full code Family Communication: Discussed with wife and daughter at the bedside Disposition Plan: Discharge home once mental status has improved   Consultants:     Procedures:     Antimicrobials:   Zosyn 1/18 >   Subjective: Patient is still drowsy, speech is somewhat incoherent, he is awake and makes eye contact.  Family has noticed that he is coughing more.  He was noted to be lethargic and febrile this morning.  Objective: Vitals:   07/21/18 1016 07/21/18 1230 07/21/18 1424 07/21/18 1500  BP: (!) 110/95   140/82  Pulse:    (!) 110  Resp: 16   18  Temp: (!) 102.6 F (39.2 C) 99.2 F (37.3 C)  99.3 F (37.4 C)  TempSrc: Axillary Axillary  Oral  SpO2: 96%  95% 90%  Weight:        Intake/Output Summary (Last 24 hours) at 07/21/2018 1648 Last data filed at 07/21/2018 0700 Gross per 24 hour  Intake 0 ml  Output 1650 ml  Net -1650 ml   Filed Weights   07/20/18 0914  Weight: 102.3 kg    Examination:  General exam: Appears calm and comfortable  Respiratory system: Bilateral rhonchi. Respiratory effort normal. Cardiovascular system: S1 & S2 heard, RRR. No JVD, murmurs, rubs, gallops or clicks. No pedal edema. Gastrointestinal system: Abdomen is nondistended, soft and nontender. No organomegaly or masses felt.  Normal bowel sounds heard. Central nervous system:  No focal neurological deficits.  Having intermittent myoclonic jerks Extremities: Symmetric 5 x 5 power. Skin: No rashes, lesions or ulcers Psychiatry: Confused, speech is difficult to comprehend    Data Reviewed: I have personally reviewed following labs and imaging studies  CBC: Recent Labs  Lab 07/20/18 0928 07/21/18 0554  WBC 9.6 12.0*  NEUTROABS 6.7  --   HGB 12.4* 12.4*  HCT 37.6* 38.6*    MCV 89.1 92.3  PLT 243 215   Basic Metabolic Panel: Recent Labs  Lab 07/20/18 0928 07/21/18 0554  NA 134* 141  K 3.9 3.7  CL 97* 109  CO2 25 23  GLUCOSE 116* 110*  BUN 59* 45*  CREATININE 3.73* 2.21*  CALCIUM 8.9 8.5*   GFR: Estimated Creatinine Clearance: 38.5 mL/min (A) (by C-G formula based on SCr of 2.21 mg/dL (H)). Liver Function Tests: Recent Labs  Lab 07/20/18 0928 07/21/18 0554  AST 19 26  ALT 23 25  ALKPHOS 130* 128*  BILITOT 0.6 1.3*  PROT 7.6 6.9  ALBUMIN 3.8 3.4*   No results for input(s): LIPASE, AMYLASE in the last 168 hours. Recent Labs  Lab 07/20/18 0954 07/20/18 1815  AMMONIA 31 33   Coagulation Profile: Recent Labs  Lab 07/20/18 0928  INR 1.15   Cardiac Enzymes: Recent Labs  Lab 07/20/18 0928  CKTOTAL 189  TROPONINI 0.04*   BNP (last 3 results) No results for input(s): PROBNP in the last 8760 hours. HbA1C: No results for input(s): HGBA1C in the last 72 hours. CBG: Recent Labs  Lab 07/20/18 0917 07/21/18 1321  GLUCAP 115* 129*   Lipid Profile: No results for input(s): CHOL, HDL, LDLCALC, TRIG, CHOLHDL, LDLDIRECT in the last 72 hours. Thyroid Function Tests: No results for input(s): TSH, T4TOTAL, FREET4, T3FREE, THYROIDAB in the last 72 hours. Anemia Panel: No results for input(s): VITAMINB12, FOLATE, FERRITIN, TIBC, IRON, RETICCTPCT in the last 72 hours. Sepsis Labs: Recent Labs  Lab 07/20/18 0954 07/20/18 1140  LATICACIDVEN 1.0 0.6    Recent Results (from the past 240 hour(s))  Urine culture     Status: None   Collection Time: 07/20/18  9:28 AM  Result Value Ref Range Status   Specimen Description   Final    URINE, CATHETERIZED Performed at [facility], [address], [place], [place] [address]    Special Requests   Final    NONE Performed at [facility], [address] [place], [place] [address]    Culture   Final    NO GROWTH Performed at [facility], [address], [place], [place] [address]     Report Status 07/21/2018 FINAL  Final  MRSA PCR Screening     Status: Abnormal   Collection Time: 07/20/18  5:34 PM  Result Value Ref Range Status   MRSA by PCR POSITIVE (A) NEGATIVE Final    Comment:        The GeneXpert MRSA Assay (FDA approved for NASAL specimens only), is one component of a comprehensive MRSA colonization surveillance program. It is not intended to diagnose MRSA infection nor to guide or monitor treatment for MRSA infections. RESULT CALLED TO, READ BACK BY AND VERIFIED WITH: J.RHEW @ 2302 ON 1.17.20 BY LBB Performed at [facility], [address] [place], [place] [address]   Culture, blood (routine x 2)     Status: None (Preliminary result)   Collection Time: 07/21/18 12:12 PM  Result Value Ref Range Status  Specimen Description BLOOD LEFT WRIST  Final   Special Requests   Final    BOTTLES DRAWN AEROBIC AND ANAEROBIC Blood Culture adequate volume Performed at [facility], [address], [place], [place] [address]    Culture PENDING  Incomplete   Report Status PENDING  Incomplete  Culture, blood (routine x 2)     Status: None (Preliminary result)   Collection Time: 07/21/18 12:12 PM  Result Value Ref Range Status   Specimen Description BLOOD RIGHT HAND  Final   Special Requests   Final    BOTTLES DRAWN AEROBIC AND ANAEROBIC Blood Culture adequate volume Performed at [facility], [address], [place], [place] [address]    Culture PENDING  Incomplete   Report Status PENDING  Incomplete         Radiology Studies: Dg Chest 1 View  Result Date: 07/20/2018 CLINICAL DATA:  Fall, altered mental status. EXAM: CHEST  1 VIEW COMPARISON:  Radiographs of February 14, 2018. FINDINGS: Stable cardiomegaly. No pneumothorax or pleural effusion is noted. Minimal bibasilar subsegmental atelectasis is noted. Old right rib fractures are noted. IMPRESSION: Minimal bibasilar subsegmental atelectasis. Electronically Signed   By: Lupita Raider, M.D.   On: 07/20/2018  10:36   Dg Lumbar Spine Complete  Result Date: 07/20/2018 CLINICAL DATA:  Multiple falls. EXAM: LUMBAR SPINE - COMPLETE 4+ VIEW COMPARISON:  Radiographs of February 21, 2018. FINDINGS: No fracture or spondylolisthesis is noted. Large anterior osteophyte formation is noted at L1-2, L3-4 and L5-S1. Moderate degenerative disc disease is noted at L5-S1. Atherosclerosis of abdominal aorta is noted. IMPRESSION: Degenerative changes as described above. No acute abnormality seen in the lumbar spine. Aortic Atherosclerosis (ICD10-I70.0). Electronically Signed   By: Lupita Raider, M.D.   On: 07/20/2018 10:38   Ct Head Wo Contrast  Result Date: 07/20/2018 CLINICAL DATA:  73 year old male with history of trauma from a fall 2 days ago with injury to the head. Additional fall yesterday without injury to the head. EXAM: CT HEAD WITHOUT CONTRAST CT CERVICAL SPINE WITHOUT CONTRAST TECHNIQUE: Multidetector CT imaging of the head and cervical spine was performed following the standard protocol without intravenous contrast. Multiplanar CT image reconstructions of the cervical spine were also generated. COMPARISON:  Head CT and cervical spine CT 05/08/2018. FINDINGS: CT HEAD FINDINGS Brain: Mild cerebral atrophy with associated ex vacuo dilatation of the ventricular system. Patchy and confluent areas of decreased attenuation are noted throughout the deep and periventricular white matter of the cerebral hemispheres bilaterally, compatible with chronic microvascular ischemic disease. No evidence of acute infarction, hemorrhage, hydrocephalus, extra-axial collection or mass lesion/mass effect. Vascular: No hyperdense vessel or unexpected calcification. Skull: Normal. Negative for fracture or focal lesion. Sinuses/Orbits: No acute finding. Other: Orthopedic fixation hardware associated with the occipital bone. CT CERVICAL SPINE FINDINGS Alignment: Mild straightening of normal cervical lordosis, similar to the prior study. Skull base  and vertebrae: Postoperative changes of fusion of the occiput through C4. No acute hardware complications. Known fracture of C6 which is obliquely oriented extending from the superior aspect of the anterior vertebral body through the inferior aspect of the posterior vertebral body, as well as into the right inferior C6 facet and the C6-C7 facet joint. Compared to the prior examination, there is increasing loss of central vertebral body height (approximately 25%), and increasing sclerosis in the vertebral body related to normal healing. No new acute displaced fracture is otherwise noted. Soft tissues and spinal canal: No prevertebral fluid or swelling. No visible canal hematoma.  Disc levels: Multilevel degenerative disc disease again noted, most severe at C6-C7. Extensive anterior osteophytosis throughout the cervical spine, with fracture at C6 extending through the anterior osteophytes. Multilevel facet arthropathy. Upper chest: Unremarkable. Other: None. IMPRESSION: 1. No acute displaced skull fracture or signs of significant acute intracranial trauma. 2. Old fracture of C6 demonstrates evidence of some healing and some mild compression compared to the prior study. No new acute findings are noted elsewhere in the cervical spine. 3. Postoperative changes of occipital-cervical fusion redemonstrated, without acute complicating features, as above. 4. Mild cerebral atrophy with chronic microvascular ischemic changes in the cerebral white matter, as above. 5. Multilevel degenerative disc disease and cervical spondylosis, as above. Electronically Signed   By: Trudie Reed M.D.   On: 07/20/2018 10:59   Ct Cervical Spine Wo Contrast  Result Date: 07/20/2018 CLINICAL DATA:  73 year old male with history of trauma from a fall 2 days ago with injury to the head. Additional fall yesterday without injury to the head. EXAM: CT HEAD WITHOUT CONTRAST CT CERVICAL SPINE WITHOUT CONTRAST TECHNIQUE: Multidetector CT imaging of  the head and cervical spine was performed following the standard protocol without intravenous contrast. Multiplanar CT image reconstructions of the cervical spine were also generated. COMPARISON:  Head CT and cervical spine CT 05/08/2018. FINDINGS: CT HEAD FINDINGS Brain: Mild cerebral atrophy with associated ex vacuo dilatation of the ventricular system. Patchy and confluent areas of decreased attenuation are noted throughout the deep and periventricular white matter of the cerebral hemispheres bilaterally, compatible with chronic microvascular ischemic disease. No evidence of acute infarction, hemorrhage, hydrocephalus, extra-axial collection or mass lesion/mass effect. Vascular: No hyperdense vessel or unexpected calcification. Skull: Normal. Negative for fracture or focal lesion. Sinuses/Orbits: No acute finding. Other: Orthopedic fixation hardware associated with the occipital bone. CT CERVICAL SPINE FINDINGS Alignment: Mild straightening of normal cervical lordosis, similar to the prior study. Skull base and vertebrae: Postoperative changes of fusion of the occiput through C4. No acute hardware complications. Known fracture of C6 which is obliquely oriented extending from the superior aspect of the anterior vertebral body through the inferior aspect of the posterior vertebral body, as well as into the right inferior C6 facet and the C6-C7 facet joint. Compared to the prior examination, there is increasing loss of central vertebral body height (approximately 25%), and increasing sclerosis in the vertebral body related to normal healing. No new acute displaced fracture is otherwise noted. Soft tissues and spinal canal: No prevertebral fluid or swelling. No visible canal hematoma. Disc levels: Multilevel degenerative disc disease again noted, most severe at C6-C7. Extensive anterior osteophytosis throughout the cervical spine, with fracture at C6 extending through the anterior osteophytes. Multilevel facet  arthropathy. Upper chest: Unremarkable. Other: None. IMPRESSION: 1. No acute displaced skull fracture or signs of significant acute intracranial trauma. 2. Old fracture of C6 demonstrates evidence of some healing and some mild compression compared to the prior study. No new acute findings are noted elsewhere in the cervical spine. 3. Postoperative changes of occipital-cervical fusion redemonstrated, without acute complicating features, as above. 4. Mild cerebral atrophy with chronic microvascular ischemic changes in the cerebral white matter, as above. 5. Multilevel degenerative disc disease and cervical spondylosis, as above. Electronically Signed   By: Trudie Reed M.D.   On: 07/20/2018 10:59   Dg Chest Port 1 View  Result Date: 07/21/2018 CLINICAL DATA:  Fever and hypoxia EXAM: PORTABLE CHEST 1 VIEW COMPARISON:  July 20, 2018 FINDINGS: The cardiomediastinal silhouette is stable. Spinal stimulator wires  are noted. No pneumothorax. Opacity seen in the medial right base. Opacity in the left retrocardiac region suspected. IMPRESSION: Opacity in the medial right lung base is suspicious for developing infiltrate. Opacity in left retrocardiac region may be infiltrate or atelectasis. Recommend follow-up to resolution. Electronically Signed   By: Gerome Samavid  Williams III M.D   On: 07/21/2018 15:16        Scheduled Meds: . enoxaparin (LOVENOX) injection  30 mg Subcutaneous Q24H  . folic acid  1 mg Oral Daily  . ipratropium-albuterol  3 mL Nebulization Q6H WA  . LORazepam  0-4 mg Intravenous Q6H   Followed by  . [START ON 07/22/2018] LORazepam  0-4 mg Intravenous Q12H  . multivitamin with minerals  1 tablet Oral Daily  . thiamine  100 mg Oral Daily   Or  . thiamine  100 mg Intravenous Daily   Continuous Infusions: . sodium chloride       LOS: 1 day    Time spent: 35 minutes    Erick BlinksJehanzeb Sameena Artus, MD [facility]   If 7PM-7AM, please contact night-coverage [email or web address]  07/21/2018,  4:48 PM

## 2018-07-21 NOTE — Progress Notes (Signed)
Pharmacy Antibiotic Note  [name] is a 73 y.o. male admitted on 07/20/2018 with aspiration pneumonia.  Pharmacy has been consulted for Zosyn dosing.  Plan: Zosyn 3.375g IV q8h (4 hour infusion).  Weight: 225 lb 8.5 oz (102.3 kg)  Temp (24hrs), Avg:99.8 F (37.7 C), Min:98.7 F (37.1 C), Max:102.6 F (39.2 C)  Recent Labs  Lab 07/20/18 0928 07/20/18 0954 07/20/18 1140 07/21/18 0554  WBC 9.6  --   --  12.0*  CREATININE 3.73*  --   --  2.21*  LATICACIDVEN  --  1.0 0.6  --     Estimated Creatinine Clearance: 38.5 mL/min (A) (by C-G formula based on SCr of 2.21 mg/dL (H)).    Allergies  Allergen Reactions  . Codeine Nausea Only    Antimicrobials this admission: Zosyn 1/18 >>      Dose adjustments this admission: N/A  Microbiology results: 1/18 BCx: pending 1/18 UCx: pending   1/18 MRSA PCR: positive  Thank you for allowing pharmacy to be a part of this patient's care.  Tad Moore 07/21/2018 6:03 PM

## 2018-07-21 NOTE — Progress Notes (Signed)
Held morning meds because patient is confused and NPO.

## 2018-07-22 LAB — COMPREHENSIVE METABOLIC PANEL
ALBUMIN: 3.2 g/dL — AB (ref 3.5–5.0)
ALT: 25 U/L (ref 0–44)
AST: 22 U/L (ref 15–41)
Alkaline Phosphatase: 115 U/L (ref 38–126)
Anion gap: 8 (ref 5–15)
BUN: 30 mg/dL — ABNORMAL HIGH (ref 8–23)
CO2: 22 mmol/L (ref 22–32)
Calcium: 8.9 mg/dL (ref 8.9–10.3)
Chloride: 116 mmol/L — ABNORMAL HIGH (ref 98–111)
Creatinine, Ser: 1.74 mg/dL — ABNORMAL HIGH (ref 0.61–1.24)
GFR calc Af Amer: 45 mL/min — ABNORMAL LOW (ref >60)
GFR calc non Af Amer: 39 mL/min — ABNORMAL LOW (ref >60)
GLUCOSE: 148 mg/dL — AB (ref 70–99)
Potassium: 3.1 mmol/L — ABNORMAL LOW (ref 3.5–5.1)
SODIUM: 146 mmol/L — AB (ref 135–145)
Total Bilirubin: 2 mg/dL — ABNORMAL HIGH (ref 0.3–1.2)
Total Protein: 7.2 g/dL (ref 6.5–8.1)

## 2018-07-22 LAB — CBC
HEMATOCRIT: 38.7 % — AB (ref 39.0–52.0)
Hemoglobin: 12.3 g/dL — ABNORMAL LOW (ref 13.0–17.0)
MCH: 28.8 pg (ref 26.0–34.0)
MCHC: 31.8 g/dL (ref 30.0–36.0)
MCV: 90.6 fL (ref 80.0–100.0)
Platelets: 196 10*3/uL (ref 150–400)
RBC: 4.27 MIL/uL (ref 4.22–5.81)
RDW: 13.7 % (ref 11.5–15.5)
WBC: 16.7 10*3/uL — ABNORMAL HIGH (ref 4.0–10.5)
nRBC: 0 % (ref 0.0–0.2)

## 2018-07-22 LAB — MAGNESIUM: Magnesium: 2.1 mg/dL (ref 1.7–2.4)

## 2018-07-22 MED ORDER — SODIUM CHLORIDE 0.45 % IV SOLN
INTRAVENOUS | Status: DC
  Administered 2018-07-22 – 2018-07-23 (×2): via INTRAVENOUS
  Filled 2018-07-22 (×4): qty 1000

## 2018-07-22 MED ORDER — POTASSIUM CHLORIDE 10 MEQ/100ML IV SOLN
10.0000 meq | INTRAVENOUS | Status: AC
  Administered 2018-07-22 (×4): 10 meq via INTRAVENOUS
  Filled 2018-07-22 (×4): qty 100

## 2018-07-22 MED ORDER — AMLODIPINE BESYLATE 5 MG PO TABS
10.0000 mg | ORAL_TABLET | Freq: Every day | ORAL | Status: DC
  Administered 2018-07-22 – 2018-07-31 (×10): 10 mg via ORAL
  Filled 2018-07-22 (×10): qty 2

## 2018-07-22 MED ORDER — SODIUM CHLORIDE 0.45 % IV SOLN: INTRAVENOUS | Status: DC

## 2018-07-22 NOTE — Progress Notes (Signed)
Held morning meds because patient is NPO and confused

## 2018-07-22 NOTE — Progress Notes (Signed)
Emptied of clear, yellow urine from foley drainage bag. Patient educated and foley catheter removed. Patient instructed to urinate as soon as possible. Will continue to monitor.

## 2018-07-22 NOTE — Progress Notes (Signed)
PROGRESS NOTE    [name]  NWG:[id number] DOB: Oct 12, 1945 DOA: 07/20/2018 PCP: Vivien Prestoorrington, Kip A, MD    Brief Narrative:  [age] year old male with a history of chronic pain syndrome, hypertension, multiple falls, brought to the hospital with worsening mental status, decreased p.o. intake and repeated falls.  He was found to have dehydration and acute renal failure.  It was felt the change in mental status likely related to medications in the setting of renal failure.  Hospital course has been complicated by development of fever and chest x-ray indicating developing pneumonia.  He has been started on intravenous antibiotics.   Assessment & Plan:   Active Problems:   Chronic pain syndrome   Essential hypertension   Ankylosing spondylitis (HCC)   Acute encephalopathy   Acute kidney injury (HCC)   Dehydration   Opiate overdose (HCC)   Aspiration pneumonia of both lower lobes due to gastric secretions (HCC)   Acute respiratory failure with hypoxia (HCC)   1. Acute encephalopathy.  Related to opiate use as well as other sedative medications in the setting of renal failure.  He initially did respond to Narcan in the emergency room.  At this time, he is awake, but still confused and drowsy.  Continue to follow.  Hold all sedative medications.  CT imaging of the head and neck on admission was unrevealing.  He is slowly improving 2. Acute kidney injury.  Likely related to dehydration, ARB use and Lasix use.  Nephrotoxic agents currently on hold.  We will continue gentle hydration.  Renal function is slowly improving 3. Unintentional opiate overdose.  The patient takes MS Contin amongst other sedatives.  Overall mental status is slowly improving.  Continue to monitor. 4. Acute respiratory failure.  Likely related to developing pneumonia.  He is on supplemental oxygen.  We will try and wean off as tolerated. 5. Pneumonia, likely aspiration related.  Patient was noted to have fever and but  had bilateral rhonchi.  Chest x-ray indicated developing pneumonia.  He is currently on Zosyn.  Influenza panel is negative.  Blood cultures are in process. 6. Hypertension.  Holding losartan/Lasix/amlodipine.  Blood pressures are trending up.  Will resume amlodipine.. 7. Chronic pain syndrome.  He will need to follow-up with his pain clinic. 8. Hypokalemia.  Replace.  Magnesium normal   DVT prophylaxis: Lovenox Code Status: Full code Family Communication: Discussed with wife at the bedside Disposition Plan: Discharge home once mental status has improved   Consultants:     Procedures:     Antimicrobials:   Zosyn 1/18 >   Subjective: Patient is awake, confused but tries to engage in conversation.  He admits to coughing.  Had a mild fever earlier today.  Objective: Vitals:   07/22/18 0600 07/22/18 0805 07/22/18 1139 07/22/18 1200  BP: (!) 144/88   (!) 176/71  Pulse: (!) 103   99  Resp: 20   18  Temp: 98.9 F (37.2 C)  (!) 100.9 F (38.3 C) 99.6 F (37.6 C)  TempSrc: Oral  Axillary Oral  SpO2: 95% 95%  96%  Weight:        Intake/Output Summary (Last 24 hours) at 07/22/2018 1326 Last data filed at 07/22/2018 0600 Gross per 24 hour  Intake 695.96 ml  Output 2150 ml  Net -1454.04 ml   Filed Weights   07/20/18 0914  Weight: 102.3 kg    Examination:  General exam: Appears calm and comfortable  Respiratory system: Rhonchi bilaterally, no wheezing. Respiratory effort normal. Cardiovascular  system: S1 & S2 heard, RRR. No JVD, murmurs, rubs, gallops or clicks. No pedal edema. Gastrointestinal system: Abdomen is nondistended, soft and nontender. No organomegaly or masses felt. Normal bowel sounds heard. Central nervous system:  No focal neurological deficits.   Extremities: Symmetric 5 x 5 power. Skin: No rashes, lesions or ulcers Psychiatry: Confused, speech is starting to clear, engages in conversation    Data Reviewed: I have personally reviewed following labs  and imaging studies  CBC: Recent Labs  Lab 07/20/18 0928 07/21/18 0554 07/22/18 0959  WBC 9.6 12.0* 16.7*  NEUTROABS 6.7  --   --   HGB 12.4* 12.4* 12.3*  HCT 37.6* 38.6* 38.7*  MCV 89.1 92.3 90.6  PLT 243 215 196   Basic Metabolic Panel: Recent Labs  Lab 07/20/18 0928 07/21/18 0554 07/22/18 0959  NA 134* 141 146*  K 3.9 3.7 3.1*  CL 97* 109 116*  CO2 25 23 22   GLUCOSE 116* 110* 148*  BUN 59* 45* 30*  CREATININE 3.73* 2.21* 1.74*  CALCIUM 8.9 8.5* 8.9  MG  --   --  2.1   GFR: Estimated Creatinine Clearance: 48.9 mL/min (A) (by C-G formula based on SCr of 1.74 mg/dL (H)). Liver Function Tests: Recent Labs  Lab 07/20/18 0928 07/21/18 0554 07/22/18 0959  AST 19 26 22   ALT 23 25 25   ALKPHOS 130* 128* 115  BILITOT 0.6 1.3* 2.0*  PROT 7.6 6.9 7.2  ALBUMIN 3.8 3.4* 3.2*   No results for input(s): LIPASE, AMYLASE in the last 168 hours. Recent Labs  Lab 07/20/18 0954 07/20/18 1815  AMMONIA 31 33   Coagulation Profile: Recent Labs  Lab 07/20/18 0928  INR 1.15   Cardiac Enzymes: Recent Labs  Lab 07/20/18 0928  CKTOTAL 189  TROPONINI 0.04*   BNP (last 3 results) No results for input(s): PROBNP in the last 8760 hours. HbA1C: No results for input(s): HGBA1C in the last 72 hours. CBG: Recent Labs  Lab 07/20/18 0917 07/21/18 1321  GLUCAP 115* 129*   Lipid Profile: No results for input(s): CHOL, HDL, LDLCALC, TRIG, CHOLHDL, LDLDIRECT in the last 72 hours. Thyroid Function Tests: No results for input(s): TSH, T4TOTAL, FREET4, T3FREE, THYROIDAB in the last 72 hours. Anemia Panel: No results for input(s): VITAMINB12, FOLATE, FERRITIN, TIBC, IRON, RETICCTPCT in the last 72 hours. Sepsis Labs: Recent Labs  Lab 07/20/18 0954 07/20/18 1140  LATICACIDVEN 1.0 0.6    Recent Results (from the past 240 hour(s))  Urine culture     Status: None   Collection Time: 07/20/18  9:28 AM  Result Value Ref Range Status   Specimen Description   Final    URINE,  CATHETERIZED Performed at [facility], [address], [place], [place] [address]    Special Requests   Final    NONE Performed at [facility], [address] [place], [place] [address]    Culture   Final    NO GROWTH Performed at [facility], [address], [place], [place] [address]    Report Status 07/21/2018 FINAL  Final  MRSA PCR Screening     Status: Abnormal   Collection Time: 07/20/18  5:34 PM  Result Value Ref Range Status   MRSA by PCR POSITIVE (A) NEGATIVE Final    Comment:        The GeneXpert MRSA Assay (FDA approved for NASAL specimens only), is one component of a comprehensive MRSA colonization surveillance program. It is not intended to diagnose MRSA infection nor  to guide or monitor treatment for MRSA infections. RESULT CALLED TO, READ BACK BY AND VERIFIED WITH: J.RHEW @ 2302 ON 1.17.20 BY LBB Performed at [facility], [address] [place], [place] [address]   Culture, blood (routine x 2)     Status: None (Preliminary result)   Collection Time: 07/21/18 12:12 PM  Result Value Ref Range Status   Specimen Description BLOOD LEFT WRIST  Final   Special Requests   Final    BOTTLES DRAWN AEROBIC AND ANAEROBIC Blood Culture adequate volume   Culture   Final    NO GROWTH < 24 HOURS Performed at [facility], [address] [place], [place] [address]    Report Status PENDING  Incomplete  Culture, blood (routine x 2)     Status: None (Preliminary result)   Collection Time: 07/21/18 12:12 PM  Result Value Ref Range Status   Specimen Description BLOOD RIGHT HAND  Final   Special Requests   Final    BOTTLES DRAWN AEROBIC AND ANAEROBIC Blood Culture adequate volume   Culture   Final    NO GROWTH < 24 HOURS Performed at [facility], [address] [place], [place] [address]    Report Status PENDING  Incomplete         Radiology Studies: Dg Chest Port 1 View  Result Date: 07/21/2018 CLINICAL DATA:  Fever and hypoxia EXAM: PORTABLE CHEST 1  VIEW COMPARISON:  July 20, 2018 FINDINGS: The cardiomediastinal silhouette is stable. Spinal stimulator wires are noted. No pneumothorax. Opacity seen in the medial right base. Opacity in the left retrocardiac region suspected. IMPRESSION: Opacity in the medial right lung base is suspicious for developing infiltrate. Opacity in left retrocardiac region may be infiltrate or atelectasis. Recommend follow-up to resolution. Electronically Signed   By: Gerome Samavid  Williams III M.D   On: 07/21/2018 15:16        Scheduled Meds: . enoxaparin (LOVENOX) injection  30 mg Subcutaneous Q24H  . folic acid  1 mg Oral Daily  . ipratropium-albuterol  3 mL Nebulization Q6H WA  . LORazepam  0-4 mg Intravenous Q6H   Followed by  . LORazepam  0-4 mg Intravenous Q12H  . multivitamin with minerals  1 tablet Oral Daily  . thiamine  100 mg Oral Daily   Or  . thiamine  100 mg Intravenous Daily   Continuous Infusions: . piperacillin-tazobactam (ZOSYN)  IV 3.375 g (07/22/18 1233)  . potassium chloride 10 mEq (07/22/18 1240)  . sodium chloride 0.45 % with kcl       LOS: 2 days    Time spent: 35 minutes    Erick BlinksJehanzeb Memon, MD [facility]   If 7PM-7AM, please contact night-coverage [email or web address]  07/22/2018, 1:26 PM

## 2018-07-23 LAB — COMPREHENSIVE METABOLIC PANEL
ALT: 22 U/L (ref 0–44)
AST: 21 U/L (ref 15–41)
Albumin: 2.8 g/dL — ABNORMAL LOW (ref 3.5–5.0)
Alkaline Phosphatase: 104 U/L (ref 38–126)
Anion gap: 9 (ref 5–15)
BUN: 27 mg/dL — ABNORMAL HIGH (ref 8–23)
CO2: 23 mmol/L (ref 22–32)
Calcium: 9 mg/dL (ref 8.9–10.3)
Chloride: 114 mmol/L — ABNORMAL HIGH (ref 98–111)
Creatinine, Ser: 1.55 mg/dL — ABNORMAL HIGH (ref 0.61–1.24)
GFR calc non Af Amer: 45 mL/min — ABNORMAL LOW (ref >60)
GFR, EST AFRICAN AMERICAN: 52 mL/min — AB (ref >60)
Glucose, Bld: 133 mg/dL — ABNORMAL HIGH (ref 70–99)
Potassium: 4.1 mmol/L (ref 3.5–5.1)
Sodium: 146 mmol/L — ABNORMAL HIGH (ref 135–145)
Total Bilirubin: 1.5 mg/dL — ABNORMAL HIGH (ref 0.3–1.2)
Total Protein: 6.6 g/dL (ref 6.5–8.1)

## 2018-07-23 LAB — CBC
HCT: 35.4 % — ABNORMAL LOW (ref 39.0–52.0)
Hemoglobin: 11.3 g/dL — ABNORMAL LOW (ref 13.0–17.0)
MCH: 29 pg (ref 26.0–34.0)
MCHC: 31.9 g/dL (ref 30.0–36.0)
MCV: 90.8 fL (ref 80.0–100.0)
Platelets: 210 10*3/uL (ref 150–400)
RBC: 3.9 MIL/uL — ABNORMAL LOW (ref 4.22–5.81)
RDW: 13.8 % (ref 11.5–15.5)
WBC: 16.5 10*3/uL — ABNORMAL HIGH (ref 4.0–10.5)
nRBC: 0 % (ref 0.0–0.2)

## 2018-07-23 MED ORDER — GUAIFENESIN ER 600 MG PO TB12
1200.0000 mg | ORAL_TABLET | Freq: Two times a day (BID) | ORAL | Status: DC
  Administered 2018-07-23 – 2018-07-31 (×17): 1200 mg via ORAL
  Filled 2018-07-23 (×17): qty 2

## 2018-07-23 MED ORDER — MUPIROCIN 2 % EX OINT
1.0000 "application " | TOPICAL_OINTMENT | Freq: Two times a day (BID) | CUTANEOUS | Status: AC
  Administered 2018-07-23 – 2018-07-27 (×10): 1 via NASAL
  Filled 2018-07-23: qty 22

## 2018-07-23 MED ORDER — TAMSULOSIN HCL 0.4 MG PO CAPS
0.4000 mg | ORAL_CAPSULE | Freq: Every day | ORAL | Status: DC
  Administered 2018-07-23 – 2018-07-31 (×9): 0.4 mg via ORAL
  Filled 2018-07-23 (×9): qty 1

## 2018-07-23 MED ORDER — CHLORHEXIDINE GLUCONATE CLOTH 2 % EX PADS
6.0000 | MEDICATED_PAD | Freq: Every day | CUTANEOUS | Status: AC
  Administered 2018-07-23 – 2018-07-27 (×5): 6 via TOPICAL

## 2018-07-23 NOTE — Progress Notes (Signed)
Patient reporting abdominal discomfort and has not urinated since removal of foley catheter. Bladder scan performed.  Dr. Antionette Charpyd notified. In and out cath done at 0400. Emptied 750 mls of clear, yellow urine. Patient denies any discomfort at this time. Will continue to monitor.

## 2018-07-23 NOTE — Progress Notes (Signed)
PT Cancellation Note  Patient Details Name: [name] MRN: [id number] DOB: 01/09/1946   Cancelled Treatment:    Reason Eval/Treat Not Completed: Patient declined, no reason specified. Patient declines initial evaluation secondary to being tired from getting up multiple times with nursing to go to the restroom. Will check on patient tomorrow.   2:51 PM, 07/23/18 Domenick Bookbinder, PT, DPT Physical Therapist with [facility] [phone] mobile phone

## 2018-07-23 NOTE — Progress Notes (Addendum)
PROGRESS NOTE    [name]  BBC:[id number] DOB: 1946-01-05 DOA: 07/20/2018 PCP: Vivien Presto, MD    Brief Narrative:  73 year old male with a history of chronic pain syndrome, hypertension, multiple falls, brought to the hospital with worsening mental status, decreased p.o. intake and repeated falls.  He was found to have dehydration and acute renal failure.  It was felt the change in mental status likely related to medications in the setting of renal failure.  Hospital course has been complicated by development of fever and chest x-ray indicating developing pneumonia.  He has been started on intravenous antibiotics.   Assessment & Plan:   Active Problems:   Chronic pain syndrome   Essential hypertension   Ankylosing spondylitis (HCC)   Acute encephalopathy   Acute kidney injury (HCC)   Dehydration   Opiate overdose (HCC)   Aspiration pneumonia of both lower lobes due to gastric secretions (HCC)   Acute respiratory failure with hypoxia (HCC)   1. Acute encephalopathy.  Related to opiate use as well as other sedative medications in the setting of renal failure.  He initially did respond to Narcan in the emergency room.  Overall mental status has substantially improved and he is now able to carry on a conversation.  Continue to follow.  Hold all sedative medications.  CT imaging of the head and neck on admission was unrevealing.  He is slowly improving 2. Acute kidney injury.  Likely related to dehydration, ARB use and Lasix use.  Nephrotoxic agents currently on hold.  He has been treated with gentle hydration.  Renal function is slowly improving 3. Unintentional opiate overdose.  The patient takes MS Contin amongst other sedatives.  Overall mental status is slowly improving.  Continue to monitor. 4. Acute respiratory failure.  Likely related to developing pneumonia.  He is on supplemental oxygen.  We will try and wean off as tolerated. 5. Pneumonia, likely aspiration  related.  Patient was noted to have fever and had bilateral rhonchi.  Chest x-ray indicated developing pneumonia.  He is currently on Zosyn.  Influenza panel is negative.  Blood cultures have shown no growth.  He still having some low-grade fever.  Continue current treatments. 6. Hypertension.  Holding losartan/Lasix  Blood pressures are currently stable on amlodipine. 7. Chronic pain syndrome.  He will need to follow-up with his pain clinic.  We will make a referral to Dr. Ronal Fear office 8. Hypokalemia.  Replaced.  Magnesium normal 9. Generalized weakness.  Physical therapy eval pending 10. Urinary retention.  Has required in and out catheterization x2.  Possibly related to immobility/anticholinergic effects of medications.  He is also been restarted on Flomax.  Continue antibiotic catheterization for 24 hours and if urinary retention persists, need to consider Foley catheter.   DVT prophylaxis: Lovenox Code Status: Full code Family Communication: Discussed with wife at the bedside Disposition Plan: Discharge home once mental status has improved   Consultants:     Procedures:     Antimicrobials:   Zosyn 1/18 >   Subjective: Patient is more awake today.  He is able to carry on a conversation.  Denies any shortness of breath.  He does have some cough.  Objective: Vitals:   07/22/18 2100 07/23/18 0500 07/23/18 0758 07/23/18 1435  BP: (!) 145/50 (!) 169/83  (!) 156/72  Pulse: 81 67  76  Resp: 18 19  18   Temp: 98.1 F (36.7 C) 98.1 F (36.7 C)  98.7 F (37.1 C)  TempSrc: Oral Oral  SpO2: 97% 98% 97% 95%  Weight:        Intake/Output Summary (Last 24 hours) at 07/23/2018 1441 Last data filed at 07/23/2018 1223 Gross per 24 hour  Intake 1080.37 ml  Output 1950 ml  Net -869.63 ml   Filed Weights   07/20/18 0914  Weight: 102.3 kg    Examination:  General exam: Alert, awake, oriented x 3 Respiratory system: Bilateral rhonchi. Respiratory effort  normal. Cardiovascular system:RRR. No murmurs, rubs, gallops. Gastrointestinal system: Abdomen is nondistended, soft and nontender. No organomegaly or masses felt. Normal bowel sounds heard. Central nervous system: Alert and oriented. No focal neurological deficits. Extremities: No C/C/E, +pedal pulses Skin: No rashes, lesions or ulcers Psychiatry: Judgement and insight appear normal. Mood & affect appropriate.   Data Reviewed: I have personally reviewed following labs and imaging studies  CBC: Recent Labs  Lab 07/20/18 0928 07/21/18 0554 07/22/18 0959 07/23/18 0714  WBC 9.6 12.0* 16.7* 16.5*  NEUTROABS 6.7  --   --   --   HGB 12.4* 12.4* 12.3* 11.3*  HCT 37.6* 38.6* 38.7* 35.4*  MCV 89.1 92.3 90.6 90.8  PLT 243 215 196 210   Basic Metabolic Panel: Recent Labs  Lab 07/20/18 0928 07/21/18 0554 07/22/18 0959 07/23/18 0714  NA 134* 141 146* 146*  K 3.9 3.7 3.1* 4.1  CL 97* 109 116* 114*  CO2 25 23 22 23   GLUCOSE 116* 110* 148* 133*  BUN 59* 45* 30* 27*  CREATININE 3.73* 2.21* 1.74* 1.55*  CALCIUM 8.9 8.5* 8.9 9.0  MG  --   --  2.1  --    GFR: Estimated Creatinine Clearance: 54.9 mL/min (A) (by C-G formula based on SCr of 1.55 mg/dL (H)). Liver Function Tests: Recent Labs  Lab 07/20/18 0928 07/21/18 0554 07/22/18 0959 07/23/18 0714  AST 19 26 22 21   ALT 23 25 25 22   ALKPHOS 130* 128* 115 104  BILITOT 0.6 1.3* 2.0* 1.5*  PROT 7.6 6.9 7.2 6.6  ALBUMIN 3.8 3.4* 3.2* 2.8*   No results for input(s): LIPASE, AMYLASE in the last 168 hours. Recent Labs  Lab 07/20/18 0954 07/20/18 1815  AMMONIA 31 33   Coagulation Profile: Recent Labs  Lab 07/20/18 0928  INR 1.15   Cardiac Enzymes: Recent Labs  Lab 07/20/18 0928  CKTOTAL 189  TROPONINI 0.04*   BNP (last 3 results) No results for input(s): PROBNP in the last 8760 hours. HbA1C: No results for input(s): HGBA1C in the last 72 hours. CBG: Recent Labs  Lab 07/20/18 0917 07/21/18 1321  GLUCAP 115*  129*   Lipid Profile: No results for input(s): CHOL, HDL, LDLCALC, TRIG, CHOLHDL, LDLDIRECT in the last 72 hours. Thyroid Function Tests: No results for input(s): TSH, T4TOTAL, FREET4, T3FREE, THYROIDAB in the last 72 hours. Anemia Panel: No results for input(s): VITAMINB12, FOLATE, FERRITIN, TIBC, IRON, RETICCTPCT in the last 72 hours. Sepsis Labs: Recent Labs  Lab 07/20/18 0954 07/20/18 1140  LATICACIDVEN 1.0 0.6    Recent Results (from the past 240 hour(s))  Urine culture     Status: None   Collection Time: 07/20/18  9:28 AM  Result Value Ref Range Status   Specimen Description   Final    URINE, CATHETERIZED Performed at [facility], [address] [place], [place] [address]    Special Requests   Final    NONE Performed at [facility], [address] [place], [place] [address]    Culture   Final    NO GROWTH Performed  at [facility], [address] [place], [place] [address]    Report Status 07/21/2018 FINAL  Final  MRSA PCR Screening     Status: Abnormal   Collection Time: 07/20/18  5:34 PM  Result Value Ref Range Status   MRSA by PCR POSITIVE (A) NEGATIVE Final    Comment:        The GeneXpert MRSA Assay (FDA approved for NASAL specimens only), is one component of a comprehensive MRSA colonization surveillance program. It is not intended to diagnose MRSA infection nor to guide or monitor treatment for MRSA infections. RESULT CALLED TO, READ BACK BY AND VERIFIED WITH: J.RHEW @ 2302 ON 1.17.20 BY LBB Performed at [facility], [address] [place], [place] [address]   Culture, blood (routine x 2)     Status: None (Preliminary result)   Collection Time: 07/21/18 12:12 PM  Result Value Ref Range Status   Specimen Description BLOOD LEFT WRIST  Final   Special Requests   Final    BOTTLES DRAWN AEROBIC AND ANAEROBIC Blood Culture adequate volume   Culture   Final    NO GROWTH 2 DAYS Performed at [facility], [address] [place], [place]  [address]    Report Status PENDING  Incomplete  Culture, blood (routine x 2)     Status: None (Preliminary result)   Collection Time: 07/21/18 12:12 PM  Result Value Ref Range Status   Specimen Description BLOOD RIGHT HAND  Final   Special Requests   Final    BOTTLES DRAWN AEROBIC AND ANAEROBIC Blood Culture adequate volume   Culture   Final    NO GROWTH 2 DAYS Performed at [facility], [address] [place], [place] [address]    Report Status PENDING  Incomplete         Radiology Studies: Dg Chest Port 1 View  Result Date: 07/21/2018 CLINICAL DATA:  Fever and hypoxia EXAM: PORTABLE CHEST 1 VIEW COMPARISON:  July 20, 2018 FINDINGS: The cardiomediastinal silhouette is stable. Spinal stimulator wires are noted. No pneumothorax. Opacity seen in the medial right base. Opacity in the left retrocardiac region suspected. IMPRESSION: Opacity in the medial right lung base is suspicious for developing infiltrate. Opacity in left retrocardiac region may be infiltrate or atelectasis. Recommend follow-up to resolution. Electronically Signed   By: Gerome Samavid  Williams III M.D   On: 07/21/2018 15:16        Scheduled Meds: . amLODipine  10 mg Oral Daily  . Chlorhexidine Gluconate Cloth  6 each Topical Q0600  . enoxaparin (LOVENOX) injection  30 mg Subcutaneous Q24H  . folic acid  1 mg Oral Daily  . guaiFENesin  1,200 mg Oral BID  . LORazepam  0-4 mg Intravenous Q12H  . multivitamin with minerals  1 tablet Oral Daily  . mupirocin ointment  1 application Nasal BID  . tamsulosin  0.4 mg Oral Daily  . thiamine  100 mg Oral Daily   Or  . thiamine  100 mg Intravenous Daily   Continuous Infusions: . piperacillin-tazobactam (ZOSYN)  IV 3.375 g (07/23/18 0915)     LOS: 3 days    Time spent: 35 minutes    Erick BlinksJehanzeb Memon, MD [facility]   If 7PM-7AM, please contact night-coverage [email or web address]  07/23/2018, 2:41 PM

## 2018-07-24 ENCOUNTER — Inpatient Hospital Stay (HOSPITAL_COMMUNITY): Payer: Medicare Other

## 2018-07-24 LAB — CBC
HCT: 36.5 % — ABNORMAL LOW (ref 39.0–52.0)
Hemoglobin: 11.7 g/dL — ABNORMAL LOW (ref 13.0–17.0)
MCH: 28.7 pg (ref 26.0–34.0)
MCHC: 32.1 g/dL (ref 30.0–36.0)
MCV: 89.5 fL (ref 80.0–100.0)
PLATELETS: 195 10*3/uL (ref 150–400)
RBC: 4.08 MIL/uL — ABNORMAL LOW (ref 4.22–5.81)
RDW: 13.5 % (ref 11.5–15.5)
WBC: 17.5 10*3/uL — ABNORMAL HIGH (ref 4.0–10.5)
nRBC: 0 % (ref 0.0–0.2)

## 2018-07-24 LAB — BASIC METABOLIC PANEL
Anion gap: 10 (ref 5–15)
BUN: 25 mg/dL — ABNORMAL HIGH (ref 8–23)
CALCIUM: 8.7 mg/dL — AB (ref 8.9–10.3)
CO2: 24 mmol/L (ref 22–32)
Chloride: 108 mmol/L (ref 98–111)
Creatinine, Ser: 1.3 mg/dL — ABNORMAL HIGH (ref 0.61–1.24)
GFR calc Af Amer: >60 mL/min (ref >60)
GFR calc non Af Amer: 55 mL/min — ABNORMAL LOW (ref >60)
Glucose, Bld: 111 mg/dL — ABNORMAL HIGH (ref 70–99)
Potassium: 3.1 mmol/L — ABNORMAL LOW (ref 3.5–5.1)
Sodium: 142 mmol/L (ref 135–145)

## 2018-07-24 LAB — MAGNESIUM: Magnesium: 1.9 mg/dL (ref 1.7–2.4)

## 2018-07-24 MED ORDER — SERTRALINE HCL 50 MG PO TABS
100.0000 mg | ORAL_TABLET | Freq: Every day | ORAL | Status: DC
  Administered 2018-07-24 – 2018-07-31 (×8): 100 mg via ORAL
  Filled 2018-07-24 (×8): qty 2

## 2018-07-24 MED ORDER — POTASSIUM CHLORIDE IN NACL 20-0.9 MEQ/L-% IV SOLN
INTRAVENOUS | Status: AC
  Administered 2018-07-24: 17:00:00 via INTRAVENOUS

## 2018-07-24 MED ORDER — PROMETHAZINE HCL 25 MG/ML IJ SOLN
12.5000 mg | Freq: Four times a day (QID) | INTRAMUSCULAR | Status: DC | PRN
  Administered 2018-07-24: 12.5 mg via INTRAVENOUS
  Filled 2018-07-24: qty 1

## 2018-07-24 MED ORDER — POTASSIUM CHLORIDE 10 MEQ/100ML IV SOLN
10.0000 meq | INTRAVENOUS | Status: AC
  Administered 2018-07-24 (×2): 10 meq via INTRAVENOUS
  Filled 2018-07-24: qty 100

## 2018-07-24 MED ORDER — CLONIDINE HCL 0.2 MG/24HR TD PTWK
0.2000 mg | MEDICATED_PATCH | TRANSDERMAL | Status: DC
  Administered 2018-07-24: 0.2 mg via TRANSDERMAL
  Filled 2018-07-24: qty 1

## 2018-07-24 NOTE — [place] FL2 (Signed)
[place] MEDICAID FL2 LEVEL OF CARE SCREENING TOOL     IDENTIFICATION  Patient Name: [name] Birthdate: 05-01-1946 Sex: male Admission Date (Current Location): 07/20/2018  [facility] and [place] Number:  [employer] and Address:  [facility],  [address] [place] [address]      Provider Number: [id number]  Attending Physician Name and Address:  Cleora Fleet, MD  Relative Name and Phone Number:       Current Level of Care: Hospital Recommended Level of Care: Skilled Nursing Facility Prior Approval Number:    Date Approved/Denied:   PASRR Number: [id number]  Discharge Plan: SNF    Current Diagnoses: Patient Active Problem List   Diagnosis Date Noted  . Aspiration pneumonia of both lower lobes due to gastric secretions (HCC) 07/21/2018  . Acute respiratory failure with hypoxia (HCC) 07/21/2018  . Acute kidney injury (HCC) 07/20/2018  . Dehydration 07/20/2018  . Opiate overdose (HCC) 07/20/2018  . Peripheral neuropathy 06/06/2018  . Acute encephalopathy 05/09/2018  . Multiple falls 05/08/2018  . 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 OBSTRUCTIVE SLEEP APNEA 08/20/2007  . Chronic pain syndrome 07/30/2007  . Essential hypertension 07/30/2007  . Ankylosing spondylitis (HCC) 07/30/2007    Orientation RESPIRATION BLADDER Height & Weight     Self, Time, Situation, Place  Normal Continent Weight: 102.3 kg Height:     BEHAVIORAL SYMPTOMS/MOOD NEUROLOGICAL BOWEL NUTRITION STATUS  (none) (none) Continent Diet(Heart healthy, Fluid Consistency: Thin)  AMBULATORY STATUS COMMUNICATION OF NEEDS Skin   Extensive Assist Verbally Normal                       Personal Care Assistance Level of Assistance  Bathing, Feeding, Dressing Bathing Assistance: Maximum assistance Feeding assistance: Independent Dressing Assistance: Limited assistance     Functional Limitations Info  Sight, Hearing, Speech Sight Info: Adequate Hearing Info:  Adequate Speech Info: Adequate    SPECIAL CARE FACTORS FREQUENCY  PT (By licensed PT)     PT Frequency: 5X/W              Contractures Contractures Info: Not present    Additional Factors Info  Code Status, Allergies Code Status Info: Full Allergies Info: Codeine           Current Medications (07/24/2018):  This is the current hospital active medication list Current Facility-Administered Medications  Medication Dose Route Frequency Provider Last Rate Last Dose  . acetaminophen (TYLENOL) tablet 650 mg  650 mg Oral Q6H PRN Erick Blinks, MD   650 mg at 07/24/18 1141   Or  . acetaminophen (TYLENOL) suppository 650 mg  650 mg Rectal Q6H PRN Erick Blinks, MD   650 mg at 07/22/18 1229  . albuterol (PROVENTIL) (2.5 MG/3ML) 0.083% nebulizer solution 2.5 mg  2.5 mg Nebulization Q4H PRN Meredeth Ide, MD      . amLODipine (NORVASC) tablet 10 mg  10 mg Oral Daily Erick Blinks, MD   10 mg at 07/24/18 0906  . Chlorhexidine Gluconate Cloth 2 % PADS 6 each  6 each Topical Q0600 Erick Blinks, MD   6 each at 07/24/18 [id number]  . enoxaparin (LOVENOX) injection 30 mg  30 mg Subcutaneous Q24H Erick Blinks, MD   30 mg at 07/23/18 1828  . folic acid (FOLVITE) tablet 1 mg  1 mg Oral Daily Erick Blinks, MD   1 mg at 07/24/18 0904  . guaiFENesin (MUCINEX) 12 hr tablet 1,200 mg  1,200 mg Oral BID Erick Blinks, MD  1,200 mg at 07/24/18 0905  . LORazepam (ATIVAN) injection 0-4 mg  0-4 mg Intravenous Q12H Erick Blinks, MD      . multivitamin with minerals tablet 1 tablet  1 tablet Oral Daily Erick Blinks, MD   1 tablet at 07/24/18 0904  . mupirocin ointment (BACTROBAN) 2 % 1 application  1 application Nasal BID Erick Blinks, MD   1 application at 07/24/18 0907  . ondansetron (ZOFRAN) tablet 4 mg  4 mg Oral Q6H PRN Erick Blinks, MD       Or  . ondansetron (ZOFRAN) injection 4 mg  4 mg Intravenous Q6H PRN Erick Blinks, MD   4 mg at 07/24/18 0902  . piperacillin-tazobactam  (ZOSYN) IVPB 3.375 g  3.375 g Intravenous Q8H Memon, Durward Mallard, MD 12.5 mL/hr at 07/24/18 0911 3.375 g at 07/24/18 0911  . potassium chloride 10 mEq in 100 mL IVPB  10 mEq Intravenous Q1 Hr x 4 Johnson, Clanford L, MD      . tamsulosin (FLOMAX) capsule 0.4 mg  0.4 mg Oral Daily Erick Blinks, MD   0.4 mg at 07/24/18 0904  . thiamine (VITAMIN B-1) tablet 100 mg  100 mg Oral Daily Erick Blinks, MD   100 mg at 07/23/18 0354   Or  . thiamine (B-1) injection 100 mg  100 mg Intravenous Daily Erick Blinks, MD   100 mg at 07/20/18 6568   Facility-Administered Medications Ordered in Other Encounters  Medication Dose Route Frequency Provider Last Rate Last Dose  . ondansetron (ZOFRAN) 4 mg in sodium chloride 0.9 % 50 mL IVPB  4 mg Intravenous Q6H PRN Barnett Abu, MD         Discharge Medications: Please see discharge summary for a list of discharge medications.  Relevant Imaging Results:  Relevant Lab Results:   Additional Information [id number]  [place], [place]

## 2018-07-24 NOTE — Clinical Social Work Placement (Signed)
   CLINICAL SOCIAL WORK PLACEMENT  NOTE  Date:  07/24/2018  Patient Details  Name: [name] MRN: [id number] Date of Birth: [birth year]  Clinical Social Work is seeking post-discharge placement for this patient at the Skilled  Nursing Facility level of care (*CSW will initial, date and re-position this form in  chart as items are completed):  Yes   Patient/family provided with [facility] Clinical Social Work Department's list of facilities offering this level of care within the geographic area requested by the patient (or if unable, by the patient's family).  Yes   Patient/family informed of their freedom to choose among providers that offer the needed level of care, that participate in Medicare, Medicaid or managed care program needed by the patient, have an available bed and are willing to accept the patient.  Yes   Patient/family informed of [facility]'s ownership interest in [facility] and [facility], as well as of the fact that they are under no obligation to receive care at these facilities.  PASRR submitted to EDS on       PASRR number received on       Existing PASRR number confirmed on 07/24/18     FL2 transmitted to all facilities in geographic area requested by pt/family on 07/24/18     FL2 transmitted to all facilities within larger geographic area on       Patient informed that his/her managed care company has contracts with or will negotiate with certain facilities, including the following:            Patient/family informed of bed offers received.  Patient chooses bed at       Physician recommends and patient chooses bed at      Patient to be transferred to   on  .  Patient to be transferred to facility by       Patient family notified on   of transfer.  Name of family member notified:        PHYSICIAN       Additional Comment:  Pt and wife identify [facility], [facility], [facility] and [facility] as options.  Info sent to  all.   _______________________________________________ [place], LCSW 07/24/2018, 12:37 PM

## 2018-07-24 NOTE — Plan of Care (Signed)
  Problem: Acute Rehab PT Goals(only PT should resolve) Goal: Pt Will Go Supine/Side To Sit Outcome: Progressing Flowsheets (Taken 07/24/2018 1209) Pt will go Supine/Side to Sit: with min guard assist Goal: Patient Will Transfer Sit To/From Stand Outcome: Progressing Flowsheets (Taken 07/24/2018 1209) Patient will transfer sit to/from stand: with min guard assist Goal: Pt Will Transfer Bed To Chair/Chair To Bed Outcome: Progressing Flowsheets (Taken 07/24/2018 1209) Pt will Transfer Bed to Chair/Chair to Bed: min guard assist Goal: Pt Will Ambulate Outcome: Progressing Flowsheets (Taken 07/24/2018 1209) Pt will Ambulate: 25 feet; with minimal assist; with rolling walker   12:10 PM, 07/24/18 Ocie Bob, MPT Physical Therapist with [facility] [phone] office [phone] mobile phone

## 2018-07-24 NOTE — Progress Notes (Signed)
PROGRESS NOTE    [name]  ZOX:[id number] DOB: 1945/08/04 DOA: 07/20/2018 PCP: Vivien Prestoorrington, Kip A, MD    Brief Narrative:  73 year old male with a history of chronic pain syndrome, hypertension, multiple falls, brought to the hospital with worsening mental status, decreased p.o. intake and repeated falls.  He was found to have dehydration and acute renal failure.  It was felt the change in mental status likely related to medications in the setting of renal failure.  Hospital course has been complicated by development of fever and chest x-ray indicating developing pneumonia.  He has been started on intravenous antibiotics.  Assessment & Plan:   Active Problems:   Chronic pain syndrome   Essential hypertension   Ankylosing spondylitis (HCC)   Acute encephalopathy   Acute kidney injury (HCC)   Dehydration   Opiate overdose (HCC)   Aspiration pneumonia of both lower lobes due to gastric secretions (HCC)   Acute respiratory failure with hypoxia (HCC)   1. Acute encephalopathy.  Related to opiate use as well as other sedative medications in the setting of renal failure.  He initially did respond to Narcan in the emergency room.  Overall mental status has substantially improved and he is now able to carry on a conversation.  Continue to follow.  Holding all sedative medications.  CT imaging of the head and neck on admission was unrevealing.  He is slowly improving.  Awaiting for PT eval to help determine placement.  2. Acute kidney injury.  Likely related to dehydration, ARB use and Lasix use.  Nephrotoxic agents currently on hold.  He has been treated with gentle hydration.  Renal function is slowly improving. 3. Hypokalemia - IV replacement ordered, checking magnesium.  4. Unintentional opiate overdose.  The patient takes MS Contin amongst other sedatives.  Overall mental status is slowly improving.  Continue to monitor. 5. Acute respiratory failure.  Likely related to developing  pneumonia.  He is on supplemental oxygen.  We will try and wean off as tolerated. 6. Pneumonia, likely aspiration related.  Patient was noted to have fever and had bilateral rhonchi.  Chest x-ray indicated developing pneumonia.  He is currently on Zosyn.  Influenza panel is negative.  Blood cultures have shown no growth.  He still having some low-grade fever.  Continue current treatments.  SLP evaluation requested.  7. Hypertension.  Holding losartan/Lasix  Blood pressures are currently stable on amlodipine. 8. Chronic pain syndrome.  He will need to follow-up with his pain clinic.  We will make a referral to Dr. Ronal Fearoonquah's office for chronic pain management.  9. Hypokalemia.  Replaced.  Magnesium normal 10. Generalized weakness.  Physical therapy recommending SNF placement.  11. Urinary retention.  Has required in and out catheterization x2.  Possibly related to immobility/anticholinergic effects of medications.  He is also been restarted on Flomax.  Continue antibiotic catheterization for 24 hours and if urinary retention persists, need to consider Foley catheter.   DVT prophylaxis: Lovenox Code Status: Full code Family Communication: Discussed with wife at the bedside Disposition Plan: SNF placement   Consultants:     Procedures:     Antimicrobials:   Zosyn 1/18 >   Subjective: Patient says he feels weak but willing to participate in PT today.    Objective: Vitals:   07/23/18 0758 07/23/18 1435 07/23/18 2242 07/24/18 0652  BP:  (!) 156/72 (!) 167/71 (!) 162/84  Pulse:  76 63 (!) 59  Resp:  18  19  Temp:  98.7 F (  37.1 C) 98.9 F (37.2 C) 98.5 F (36.9 C)  TempSrc:   Oral Oral  SpO2: 97% 95% 95% 98%  Weight:        Intake/Output Summary (Last 24 hours) at 07/24/2018 1206 Last data filed at 07/24/2018 0900 Gross per 24 hour  Intake 480 ml  Output 3900 ml  Net -3420 ml   Filed Weights   07/20/18 0914  Weight: 102.3 kg    Examination:  General exam: Alert,  awake, oriented x 3 Respiratory system: coarse rhonchi. Respiratory effort normal. Cardiovascular system: normal s1,s2 sounds.  No murmurs, rubs, gallops. Gastrointestinal system: Abdomen is nondistended, soft and nontender. No organomegaly or masses felt. Normal bowel sounds heard. Central nervous system: Alert and oriented. No focal neurological deficits. Extremities: No C/C/E, +pedal pulses Skin: No rashes, lesions or ulcers Psychiatry: Judgement and insight appear normal. Mood & affect appropriate.   Data Reviewed: I have personally reviewed following labs and imaging studies  CBC: Recent Labs  Lab 07/20/18 0928 07/21/18 0554 07/22/18 0959 07/23/18 0714 07/24/18 0725  WBC 9.6 12.0* 16.7* 16.5* 17.5*  NEUTROABS 6.7  --   --   --   --   HGB 12.4* 12.4* 12.3* 11.3* 11.7*  HCT 37.6* 38.6* 38.7* 35.4* 36.5*  MCV 89.1 92.3 90.6 90.8 89.5  PLT 243 215 196 210 195   Basic Metabolic Panel: Recent Labs  Lab 07/20/18 0928 07/21/18 0554 07/22/18 0959 07/23/18 0714 07/24/18 0725  NA 134* 141 146* 146* 142  K 3.9 3.7 3.1* 4.1 3.1*  CL 97* 109 116* 114* 108  CO2 25 23 22 23 24   GLUCOSE 116* 110* 148* 133* 111*  BUN 59* 45* 30* 27* 25*  CREATININE 3.73* 2.21* 1.74* 1.55* 1.30*  CALCIUM 8.9 8.5* 8.9 9.0 8.7*  MG  --   --  2.1  --   --    GFR: Estimated Creatinine Clearance: 65.4 mL/min (A) (by C-G formula based on SCr of 1.3 mg/dL (H)). Liver Function Tests: Recent Labs  Lab 07/20/18 0928 07/21/18 0554 07/22/18 0959 07/23/18 0714  AST 19 26 22 21   ALT 23 25 25 22   ALKPHOS 130* 128* 115 104  BILITOT 0.6 1.3* 2.0* 1.5*  PROT 7.6 6.9 7.2 6.6  ALBUMIN 3.8 3.4* 3.2* 2.8*   No results for input(s): LIPASE, AMYLASE in the last 168 hours. Recent Labs  Lab 07/20/18 0954 07/20/18 1815  AMMONIA 31 33   Coagulation Profile: Recent Labs  Lab 07/20/18 0928  INR 1.15   Cardiac Enzymes: Recent Labs  Lab 07/20/18 0928  CKTOTAL 189  TROPONINI 0.04*   BNP (last 3  results) No results for input(s): PROBNP in the last 8760 hours. HbA1C: No results for input(s): HGBA1C in the last 72 hours. CBG: Recent Labs  Lab 07/20/18 0917 07/21/18 1321  GLUCAP 115* 129*   Lipid Profile: No results for input(s): CHOL, HDL, LDLCALC, TRIG, CHOLHDL, LDLDIRECT in the last 72 hours. Thyroid Function Tests: No results for input(s): TSH, T4TOTAL, FREET4, T3FREE, THYROIDAB in the last 72 hours. Anemia Panel: No results for input(s): VITAMINB12, FOLATE, FERRITIN, TIBC, IRON, RETICCTPCT in the last 72 hours. Sepsis Labs: Recent Labs  Lab 07/20/18 0954 07/20/18 1140  LATICACIDVEN 1.0 0.6    Recent Results (from the past 240 hour(s))  Urine culture     Status: None   Collection Time: 07/20/18  9:28 AM  Result Value Ref Range Status   Specimen Description   Final    URINE, CATHETERIZED Performed at [facility], [address] [place], [place] [address]    Special Requests   Final    NONE Performed at [facility], [address] [place], [place] [address]    Culture   Final    NO GROWTH Performed at [facility], [address], [place], [place] [address]    Report Status 07/21/2018 FINAL  Final  MRSA PCR Screening     Status: Abnormal   Collection Time: 07/20/18  5:34 PM  Result Value Ref Range Status   MRSA by PCR POSITIVE (A) NEGATIVE Final    Comment:        The GeneXpert MRSA Assay (FDA approved for NASAL specimens only), is one component of a comprehensive MRSA colonization surveillance program. It is not intended to diagnose MRSA infection nor to guide or monitor treatment for MRSA infections. RESULT CALLED TO, READ BACK BY AND VERIFIED WITH: J.RHEW @ 2302 ON 1.17.20 BY LBB Performed at [facility], [address] [place], [place] [address]   Culture, blood (routine x 2)     Status: None (Preliminary result)   Collection Time: 07/21/18 12:12 PM  Result Value Ref Range Status   Specimen Description BLOOD LEFT WRIST  Final    Special Requests   Final    BOTTLES DRAWN AEROBIC AND ANAEROBIC Blood Culture adequate volume   Culture   Final    NO GROWTH 3 DAYS Performed at [facility], [address] [place], [place] [address]    Report Status PENDING  Incomplete  Culture, blood (routine x 2)     Status: None (Preliminary result)   Collection Time: 07/21/18 12:12 PM  Result Value Ref Range Status   Specimen Description BLOOD RIGHT HAND  Final   Special Requests   Final    BOTTLES DRAWN AEROBIC AND ANAEROBIC Blood Culture adequate volume   Culture   Final    NO GROWTH 3 DAYS Performed at [facility], [address] [place], [place] [address]    Report Status PENDING  Incomplete    Radiology Studies: No results found.  Scheduled Meds: . amLODipine  10 mg Oral Daily  . Chlorhexidine Gluconate Cloth  6 each Topical Q0600  . enoxaparin (LOVENOX) injection  30 mg Subcutaneous Q24H  . folic acid  1 mg Oral Daily  . guaiFENesin  1,200 mg Oral BID  . LORazepam  0-4 mg Intravenous Q12H  . multivitamin with minerals  1 tablet Oral Daily  . mupirocin ointment  1 application Nasal BID  . tamsulosin  0.4 mg Oral Daily  . thiamine  100 mg Oral Daily   Or  . thiamine  100 mg Intravenous Daily   Continuous Infusions: . piperacillin-tazobactam (ZOSYN)  IV 3.375 g (07/24/18 0911)  . potassium chloride       LOS: 4 days   Time spent: 35 minutes  Standley Dakins, MD [facility]   If 7PM-7AM, please contact night-coverage [email or web address]  07/24/2018, 12:06 PM

## 2018-07-24 NOTE — Evaluation (Addendum)
Physical Therapy Evaluation Patient Details Name: [name] MRN: [id number] DOB: 29-May-1946 Today's Date: 07/24/2018   History of Present Illness  [name] is a 73 y.o. male with medical history significant of  Chronic pain syndrome, HTN, multiple falls, peripheral neuropathy, who was admitted in 05/2018 for frequent falls which was felt at that time to be related to alcohol abuse.  He suffered a C6 fracture at that time and was discharged with a cervical collar.  He has since followed up with neurosurgery and cervical collar has been discontinued.  After that last admission, he went to a skilled nursing facility for physical therapy and wife reports that he was doing well upon return home.  He is followed at a pain clinic and recently his opiates were discontinued.  Patient is still drowsy at this time and is unable to provide history.  His wife reports that he continue to take reportedly 1 tablet of MS Contin daily from his remaining prescription.  She has noted over the past week, he has been increasingly lethargic, weak.  He has had decreased p.o. intake.  He has been confused.  He is not had any fever, diarrhea or vomiting.  No complaints of chest pain or shortness of breath.  He is also been falling again lately, but is pretty much been in bed for the last week.  She feels that he sleeps 20 out of 24 hours.    Clinical Impression  Patient functioning well below baseline and limited for functional mobility and gait as stated below secondary to BLE weakness, fatigue, and poor standing balance.  Patient limited to a few steps at bedside and on room air throughout treatment with O2 saturation between 91-94% - RN notified.  Patient tolerated sitting up in chair with his spouse at bedside after therapy.  Patient will benefit from continued physical therapy in hospital and recommended venue below to increase strength, balance, endurance for safe ADLs and gait.    Follow Up  Recommendations SNF    Equipment Recommendations  None recommended by PT    Recommendations for Other Services       Precautions / Restrictions Precautions Precautions: Fall Restrictions Weight Bearing Restrictions: No      Mobility  Bed Mobility Overal bed mobility: Needs Assistance Bed Mobility: Supine to Sit     Supine to sit: Min assist     General bed mobility comments: slow labored movement  Transfers Overall transfer level: Needs assistance Equipment used: Rolling walker (2 wheeled) Transfers: Sit to/from [employer] Sit to Stand: Min assist;Mod assist Stand pivot transfers: Min assist;Mod assist       General transfer comment: unsteady labored movement  Ambulation/Gait Ambulation/Gait assistance: Mod assist Gait Distance (Feet): 4 Feet Assistive device: Rolling walker (2 wheeled) Gait Pattern/deviations: Decreased step length - right;Decreased step length - left;Decreased stride length Gait velocity: slow   General Gait Details: limited to 5-6 slow labored steps at bedside due to BLE weakness, poor standing balance and c/o fatigue  Stairs            Wheelchair Mobility    Modified Rankin (Stroke Patients Only)       Balance Overall balance assessment: Needs assistance Sitting-balance support: Feet supported;No upper extremity supported Sitting balance-Leahy Scale: Fair Sitting balance - Comments: fair/good   Standing balance support: Bilateral upper extremity supported;During functional activity Standing balance-Leahy Scale: Poor Standing balance comment: fair/poor with RW  Pertinent Vitals/Pain Pain Assessment: No/denies pain    Home Living Family/patient expects to be discharged to:: Private residence Living Arrangements: Spouse/significant other Available Help at Discharge: Family Type of Home: House       Home Layout: Multi-level;Able to live on main level with  bedroom/bathroom Home Equipment: Walker - 4 wheels;Walker - 2 wheels;Cane - single point;Shower seat;Bedside commode;Hospital bed;Wheelchair - manual      Prior Function Level of Independence: Independent with assistive device(s)         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, Civil Servicecommunity using Rollator     Hand Dominance   Dominant Hand: Right    Extremity/Trunk Assessment   Upper Extremity Assessment Upper Extremity Assessment: Generalized weakness    Lower Extremity Assessment Lower Extremity Assessment: Generalized weakness    Cervical / Trunk Assessment Cervical / Trunk Assessment: Normal  Communication   Communication: No difficulties  Cognition Arousal/Alertness: Awake/alert Behavior During Therapy: WFL for tasks assessed/performed Overall Cognitive Status: Within Functional Limits for tasks assessed                                        General Comments      Exercises     Assessment/Plan    PT Assessment Patient needs continued PT services  PT Problem List Decreased strength;Decreased balance;Decreased activity tolerance;Decreased mobility       PT Treatment Interventions Gait training;Stair training;Functional mobility training;Therapeutic activities;Patient/family education;Therapeutic exercise    PT Goals (Current goals can be found in the Care Plan section)  Acute Rehab PT Goals Patient Stated Goal: return home after rehab PT Goal Formulation: With patient/family Time For Goal Achievement: 08/07/18 Potential to Achieve Goals: Good    Frequency Min 3X/week   Barriers to discharge        Co-evaluation               AM-PAC PT "6 Clicks" Mobility  Outcome Measure Help needed turning from your back to your side while in a flat bed without using bedrails?: A Little Help needed moving from lying on your back to sitting on the side of a flat bed without using bedrails?: A Little Help needed moving to and from a bed  to a chair (including a wheelchair)?: A Lot Help needed standing up from a chair using your arms (e.g., wheelchair or bedside chair)?: A Lot Help needed to walk in hospital room?: A Lot Help needed climbing 3-5 steps with a railing? : Total 6 Click Score: 13    End of Session Equipment Utilized During Treatment: Gait belt Activity Tolerance: Patient tolerated treatment well;Patient limited by fatigue Patient left: in chair;with call bell/phone within reach;with family/visitor present Nurse Communication: Mobility status PT Visit Diagnosis: Unsteadiness on feet (R26.81);Other abnormalities of gait and mobility (R26.89);Muscle weakness (generalized) (M62.81)    Time: [id number] PT Time Calculation (min) (ACUTE ONLY): 34 min   Charges:   PT Evaluation $PT Eval High Complexity: 1 High          12:07 PM, 07/24/18 Ocie BobJames Whit Bruni, MPT Physical Therapist [facility] [phone] office [phone] mobile phone

## 2018-07-24 NOTE — Progress Notes (Signed)
Pharmacy Antibiotic Note  [name] is a 73 y.o. male admitted on 07/20/2018 with aspiration pneumonia.  Pharmacy has been consulted for Zosyn dosing.  Plan: Zosyn 3.375g IV q8h (4 hour infusion).  Plan to discharge once bed open for patient  Weight: 225 lb 8.5 oz (102.3 kg)  Temp (24hrs), Avg:98.7 F (37.1 C), Min:98.5 F (36.9 C), Max:98.9 F (37.2 C)  Recent Labs  Lab 07/20/18 0928 07/20/18 0954 07/20/18 1140 07/21/18 0554 07/22/18 0959 07/23/18 0714 07/24/18 0725  WBC 9.6  --   --  12.0* 16.7* 16.5* 17.5*  CREATININE 3.73*  --   --  2.21* 1.74* 1.55* 1.30*  LATICACIDVEN  --  1.0 0.6  --   --   --   --     Estimated Creatinine Clearance: 65.4 mL/min (A) (by C-G formula based on SCr of 1.3 mg/dL (H)).    Allergies  Allergen Reactions  . Codeine Nausea Only    Antimicrobials this admission: Zosyn 1/18 >>      Dose adjustments this admission: N/A  Microbiology results: 1/18 BCx: ngtd 1/18 UCx: ng   1/18 MRSA PCR: positive  Thank you for allowing pharmacy to be a part of this patient's care.  Tad MooreSteven C Daphnee Preiss 07/24/2018 1:56 PM

## 2018-07-25 ENCOUNTER — Inpatient Hospital Stay (HOSPITAL_COMMUNITY): Payer: Medicare Other

## 2018-07-25 DIAGNOSIS — K2980 Duodenitis without bleeding: Secondary | ICD-10-CM | POA: Diagnosis present

## 2018-07-25 DIAGNOSIS — S22070A Wedge compression fracture of T9-T10 vertebra, initial encounter for closed fracture: Secondary | ICD-10-CM | POA: Diagnosis present

## 2018-07-25 LAB — CBC WITH DIFFERENTIAL/PLATELET
Abs Immature Granulocytes: 0.12 10*3/uL — ABNORMAL HIGH (ref 0.00–0.07)
Basophils Absolute: 0 10*3/uL (ref 0.0–0.1)
Basophils Relative: 0 %
Eosinophils Absolute: 0.1 10*3/uL (ref 0.0–0.5)
Eosinophils Relative: 1 %
HCT: 41.1 % (ref 39.0–52.0)
Hemoglobin: 13.2 g/dL (ref 13.0–17.0)
Immature Granulocytes: 1 %
Lymphocytes Relative: 5 %
Lymphs Abs: 0.9 10*3/uL (ref 0.7–4.0)
MCH: 28.6 pg (ref 26.0–34.0)
MCHC: 32.1 g/dL (ref 30.0–36.0)
MCV: 89.2 fL (ref 80.0–100.0)
Monocytes Absolute: 0.7 10*3/uL (ref 0.1–1.0)
Monocytes Relative: 4 %
NEUTROS ABS: 14.9 10*3/uL — AB (ref 1.7–7.7)
Neutrophils Relative %: 89 %
Platelets: 223 10*3/uL (ref 150–400)
RBC: 4.61 MIL/uL (ref 4.22–5.81)
RDW: 13.2 % (ref 11.5–15.5)
WBC: 16.7 10*3/uL — ABNORMAL HIGH (ref 4.0–10.5)
nRBC: 0 % (ref 0.0–0.2)

## 2018-07-25 LAB — RENAL FUNCTION PANEL
Albumin: 3 g/dL — ABNORMAL LOW (ref 3.5–5.0)
Anion gap: 12 (ref 5–15)
BUN: 23 mg/dL (ref 8–23)
CO2: 25 mmol/L (ref 22–32)
Calcium: 9 mg/dL (ref 8.9–10.3)
Chloride: 105 mmol/L (ref 98–111)
Creatinine, Ser: 1.25 mg/dL — ABNORMAL HIGH (ref 0.61–1.24)
GFR calc Af Amer: >60 mL/min (ref >60)
GFR calc non Af Amer: 58 mL/min — ABNORMAL LOW (ref >60)
Glucose, Bld: 110 mg/dL — ABNORMAL HIGH (ref 70–99)
Phosphorus: 1.8 mg/dL — ABNORMAL LOW (ref 2.5–4.6)
Potassium: 3.1 mmol/L — ABNORMAL LOW (ref 3.5–5.1)
Sodium: 142 mmol/L (ref 135–145)

## 2018-07-25 LAB — C DIFFICILE QUICK SCREEN W PCR REFLEX
C Diff antigen: NEGATIVE
C Diff interpretation: NOT DETECTED
C Diff toxin: NEGATIVE

## 2018-07-25 LAB — MAGNESIUM: MAGNESIUM: 2 mg/dL (ref 1.7–2.4)

## 2018-07-25 MED ORDER — IOPAMIDOL (ISOVUE-300) INJECTION 61%
50.0000 mL | Freq: Once | INTRAVENOUS | Status: AC | PRN
  Administered 2018-07-25: 30 mL via ORAL

## 2018-07-25 MED ORDER — CLONIDINE HCL 0.1 MG PO TABS
0.1000 mg | ORAL_TABLET | Freq: Four times a day (QID) | ORAL | Status: DC | PRN
  Administered 2018-07-25: 0.1 mg via ORAL
  Filled 2018-07-25: qty 1

## 2018-07-25 MED ORDER — POTASSIUM CHLORIDE 10 MEQ/100ML IV SOLN
10.0000 meq | INTRAVENOUS | Status: AC
  Administered 2018-07-25 (×5): 10 meq via INTRAVENOUS
  Filled 2018-07-25: qty 100

## 2018-07-25 MED ORDER — SACCHAROMYCES BOULARDII 250 MG PO CAPS
250.0000 mg | ORAL_CAPSULE | Freq: Two times a day (BID) | ORAL | Status: DC
  Administered 2018-07-25 – 2018-07-31 (×12): 250 mg via ORAL
  Filled 2018-07-25 (×12): qty 1

## 2018-07-25 MED ORDER — AMOXICILLIN-POT CLAVULANATE 875-125 MG PO TABS
1.0000 | ORAL_TABLET | Freq: Two times a day (BID) | ORAL | Status: AC
  Administered 2018-07-25 – 2018-07-26 (×4): 1 via ORAL
  Filled 2018-07-25 (×4): qty 1

## 2018-07-25 MED ORDER — PANTOPRAZOLE SODIUM 40 MG IV SOLR
40.0000 mg | Freq: Two times a day (BID) | INTRAVENOUS | Status: DC
  Administered 2018-07-26 – 2018-07-27 (×3): 40 mg via INTRAVENOUS
  Filled 2018-07-25 (×4): qty 40

## 2018-07-25 MED ORDER — DIPHENOXYLATE-ATROPINE 2.5-0.025 MG PO TABS
2.0000 | ORAL_TABLET | Freq: Four times a day (QID) | ORAL | Status: DC | PRN
  Administered 2018-07-25 – 2018-07-26 (×3): 2 via ORAL
  Filled 2018-07-25 (×3): qty 2

## 2018-07-25 MED ORDER — POTASSIUM CHLORIDE IN NACL 20-0.9 MEQ/L-% IV SOLN
INTRAVENOUS | Status: AC
  Administered 2018-07-25 – 2018-07-26 (×2): via INTRAVENOUS

## 2018-07-25 MED ORDER — IOPAMIDOL (ISOVUE-300) INJECTION 61%
100.0000 mL | Freq: Once | INTRAVENOUS | Status: AC | PRN
  Administered 2018-07-25: 100 mL via INTRAVENOUS

## 2018-07-25 NOTE — Progress Notes (Signed)
Physical Therapy Treatment Patient Details Name: [name] MRN: [id number] DOB: 11-29-1945 Today's Date: 07/25/2018    History of Present Illness [name] is a 73 y.o. male with medical history significant of  Chronic pain syndrome, HTN, multiple falls, peripheral neuropathy, who was admitted in 05/2018 for frequent falls which was felt at that time to be related to alcohol abuse.  He suffered a C6 fracture at that time and was discharged with a cervical collar.  He has since followed up with neurosurgery and cervical collar has been discontinued.  After that last admission, he went to a skilled nursing facility for physical therapy and wife reports that he was doing well upon return home.  He is followed at a pain clinic and recently his opiates were discontinued.  Patient is still drowsy at this time and is unable to provide history.  His wife reports that he continue to take reportedly 1 tablet of MS Contin daily from his remaining prescription.  She has noted over the past week, he has been increasingly lethargic, weak.  He has had decreased p.o. intake.  He has been confused.  He is not had any fever, diarrhea or vomiting.  No complaints of chest pain or shortness of breath.  He is also been falling again lately, but is pretty much been in bed for the last week.  She feels that he sleeps 20 out of 24 hours.    PT Comments    Patient presents supine in bed, pleasant, and agreeable to therapy. Patient limited by diarrhea complaints today and reports nursing took a stool sample due to subsequent days of discomfort and loose stools. Patient performs supine to sit transfer with min assist requiring increased time, use of bed rail, and demonstrates labored movement.  Patient performs sit to/from stand reps with min assist from edge of bed with RW and tolerates static standing with bil upper extremity support and min guard assist for up to 2 min increments before requiring seated rest  break. Patient performs stand pivot transfer with RW and min assist for steadying and RW management due to patient unsteady throughout transfer. Verbal cues for hand placement with transfer to improve safety. Patient unable to tolerate seated exercises after transferring to bedside chair due to feeling depleted from mobility and stomach discomfort. Patient left in chair with chair alarm on and call bell in hand. Patient will benefit from continued physical therapy in hospital and recommended venue below to increase strength, balance, endurance for safe ADLs and gait.    Follow Up Recommendations  SNF     Equipment Recommendations  None recommended by PT    Recommendations for Other Services       Precautions / Restrictions Precautions Precautions: Fall Restrictions Weight Bearing Restrictions: No    Mobility  Bed Mobility Overal bed mobility: Needs Assistance Bed Mobility: Supine to Sit     Supine to sit: Min assist     General bed mobility comments: increased time, use of bed rail, labored movement  Transfers Overall transfer level: Needs assistance Equipment used: Rolling walker (2 wheeled) Transfers: Sit to/from [employer] Sit to Stand: Min assist Stand pivot transfers: Min assist       General transfer comment: increased time, unsteady upon standing and throughout pivot with transfer, assistance with RW management  Ambulation/Gait             General Gait Details: unable secondary to diarrhea and generalized weakness   Stairs  Wheelchair Mobility    Modified Rankin (Stroke Patients Only)       Balance Overall balance assessment: Needs assistance Sitting-balance support: Feet supported;No upper extremity supported Sitting balance-Leahy Scale: Fair Sitting balance - Comments: seated edge of bed   Standing balance support: Bilateral upper extremity supported;During functional activity Standing balance-Leahy Scale:  Poor Standing balance comment: with RW                            Cognition Arousal/Alertness: Awake/alert Behavior During Therapy: WFL for tasks assessed/performed Overall Cognitive Status: Within Functional Limits for tasks assessed                                        Exercises      General Comments        Pertinent Vitals/Pain Pain Assessment: No/denies pain    Home Living                      Prior Function            PT Goals (current goals can now be found in the care plan section) Acute Rehab PT Goals Patient Stated Goal: return home after rehab PT Goal Formulation: With patient/family Time For Goal Achievement: 08/07/18 Potential to Achieve Goals: Good Progress towards PT goals: Progressing toward goals    Frequency    Min 3X/week      PT Plan Current plan remains appropriate    Co-evaluation              AM-PAC PT "6 Clicks" Mobility   Outcome Measure  Help needed turning from your back to your side while in a flat bed without using bedrails?: A Little Help needed moving from lying on your back to sitting on the side of a flat bed without using bedrails?: A Little Help needed moving to and from a bed to a chair (including a wheelchair)?: A Lot Help needed standing up from a chair using your arms (e.g., wheelchair or bedside chair)?: A Lot Help needed to walk in hospital room?: Total Help needed climbing 3-5 steps with a railing? : Total 6 Click Score: 12    End of Session Equipment Utilized During Treatment: Gait belt Activity Tolerance: Patient tolerated treatment well;Patient limited by fatigue Patient left: in chair;with call bell/phone within reach;with chair alarm set Nurse Communication: Mobility status PT Visit Diagnosis: Unsteadiness on feet (R26.81);Other abnormalities of gait and mobility (R26.89);Muscle weakness (generalized) (M62.81)     Time: 7169-6789 PT Time Calculation (min)  (ACUTE ONLY): 25 min  Charges:  $Therapeutic Activity: 23-37 mins                     3:45 PM, 07/25/18 Domenick Bookbinder, DPT Physical Therapist with [facility] [phone] office

## 2018-07-25 NOTE — Clinical Social Work Note (Signed)
Clinical Social Work Assessment  Patient Details  Name: [name] MRN: [id number] Date of Birth: 1945-07-22  Date of referral:  07/25/18               Reason for consult:  Facility Placement                Permission sought to share information with:  Family Supports Permission granted to share information::  Yes, Verbal Permission Granted  Name::     Odis HollingsheadRuth Mcadory  Agency::     Relationship::  wife  Contact Information:     Housing/Transportation Living arrangements for the past 2 months:  Skilled Holiday representativeursing Facility, Single Family Home Source of Information:  Patient, Spouse Patient Interpreter Needed:  None Criminal Activity/Legal Involvement Pertinent to Current Situation/Hospitalization:  No - Comment as needed Significant Relationships:  Spouse, Adult Children Lives with:  Spouse Do you feel safe going back to the place where you live?  No Need for family participation in patient care:  Yes (Comment)  Care giving concerns:  Pt returned from SNF in early December after [facility]  Wife states he did not progress significantly while there, and was basically chair and bed bound at home with slow decline in strength and health.  Pt admits he is unable to do what he needs to due to lack of strength, and wants to go back to rehab, but not to same facility.   Social Worker assessment / plan: 73 YO caucasian male diagnosed with acute kidney injury, history of substance use/abuse and hospitalization within past 60 days or so.    Employment status:  Retired Database administratornsurance information:  Managed Medicare PT Recommendations:  Skilled Nursing Facility Information / Referral to community resources:  Skilled Nursing Facility  Patient/Family's Response to care:  [place], greatful.  Patient/Family's Understanding of and Emotional Response to Diagnosis, Current Treatment, and Prognosis:  Fully understand and are accepting.  Emotional Assessment Appearance:   Appears stated age Attitude/Demeanor/Rapport:  Engaged Affect (typically observed):  Appropriate Orientation:  Oriented to Self, Oriented to Place, Oriented to Situation Alcohol / Substance use:  Alcohol Use(drnk regulalarly in 2019 before he became ill, to the extent that wife had concerns) Psych involvement (Current and /or in the community):  No (Comment)  Discharge Needs  Concerns to be addressed:  Discharge Planning Concerns Readmission within the last 30 days:    Current discharge risk:    Barriers to Discharge:  No SNF bed   Ida RogueRodney B Fernando Stoiber, LCSW 07/25/2018, 8:34 AM

## 2018-07-25 NOTE — Progress Notes (Signed)
Resident noted on floor on his back at 2130. Scratches noted to back and loose BM on bed, floor and pt. Unable to state what he was attempting to do, did not utlilize call bell, side rail noted to be up and IV pulled out. Denies any pain or hitting head. Notified mid level who ordered CT scan. Notified emergency contact Deavion Sunada to make aware. Pt bathed, placed back in bed, bed alarm on, call bell within reach and fall mat in place. Will continue to monitor.

## 2018-07-25 NOTE — Progress Notes (Signed)
PROGRESS NOTE    [name]  ZOX:[id number] DOB: August 16, 1945 DOA: 07/20/2018 PCP: Vivien Prestoorrington, Kip A, MD    Brief Narrative:  73 year old male with a history of chronic pain syndrome, hypertension, multiple falls, brought to the hospital with worsening mental status, decreased p.o. intake and repeated falls.  He was found to have dehydration and acute renal failure.  It was felt the change in mental status likely related to medications in the setting of renal failure.  Hospital course has been complicated by development of fever and chest x-ray indicating developing pneumonia.  He has been started on intravenous antibiotics.  Assessment & Plan:   Active Problems:   Chronic pain syndrome   Essential hypertension   Ankylosing spondylitis (HCC)   Acute encephalopathy   Acute kidney injury (HCC)   Dehydration   Opiate overdose (HCC)   Aspiration pneumonia of both lower lobes due to gastric secretions (HCC)   Acute respiratory failure with hypoxia (HCC)   1. Acute encephalopathy. RESOLVED.  Related to opiate use as well as other sedative medications in the setting of renal failure.  He initially did respond to Narcan in the emergency room.  Overall mental status has substantially improved and he is now able to carry on a conversation.  Continue to follow.  Holding all sedative medications.  CT imaging of the head and neck on admission was unrevealing.  He is slowly improving.  Awaiting for PT eval to help determine placement.  2. Acute kidney injury.  IMPROVING with gently hydration.  Likely related to dehydration, ARB use and Lasix use.  Nephrotoxic agents currently on hold.  He has been treated with gentle hydration.  Renal function is slowly improving. 3. Hypokalemia - IV replacement ordered, checking magnesium.  Recheck renal function panel in AM.  4. Unintentional opiate overdose.  The patient takes MS Contin amongst other sedatives.  Overall mental status is slowly improving.   Continue to monitor.  Opioids have not been restarted.   5. Acute respiratory failure.  RESOLVED.  Related to pneumonia.  He is on supplemental oxygen.  We will try and wean off as tolerated. 6. Diarrhea - c diff negative, added lomotil and added probiotics - florastor BID.  7. Aspiration Pneumonia, likely aspiration related.  Patient was noted to have fever and had bilateral rhonchi.  Chest x-ray indicated developing pneumonia.  He is currently on oral augmentin.  Influenza panel is negative.  Blood cultures have shown no growth.   SLP evaluation requested.  8. Abdominal pain - nausea CT suggesting duodenitis - IV protonix ordered.  9. T10 compression fracture - TLSO brace ordered.  10. Hypertension.  Holding losartan/Lasix  Blood pressures are currently stable on amlodipine. 11. Chronic pain syndrome.  He will need to follow-up with his pain clinic.  We will make a referral to Dr. Ronal Fearoonquah's office for chronic pain management.  12. Hypokalemia.  Replaced.  Magnesium normal 13. Generalized weakness.  Physical therapy recommending SNF placement.  14. Urinary retention.  Has required in and out catheterization x2.  Possibly related to immobility/anticholinergic effects of medications.  He is also been restarted on Flomax.  Continue antibiotic catheterization for 24 hours and if urinary retention persists, need to consider Foley catheter.   DVT prophylaxis: Lovenox Code Status: Full code Family Communication: Discussed with wife at the bedside Disposition Plan: SNF placement   Consultants:     Procedures:     Antimicrobials:   Zosyn 1/18 >   Subjective: Patient says he has  been having loose stools.     Objective: Vitals:   07/25/18 0359 07/25/18 0448 07/25/18 0627 07/25/18 1559  BP: (!) 183/93 (!) 186/95 (!) 153/95 (!) 163/74  Pulse: 78  73 66  Resp:    20  Temp: 98.7 F (37.1 C)  98.1 F (36.7 C) 97.8 F (36.6 C)  TempSrc: Oral  Oral   SpO2: 95%  96% 95%  Weight:         Intake/Output Summary (Last 24 hours) at 07/25/2018 1811 Last data filed at 07/25/2018 1100 Gross per 24 hour  Intake 730.59 ml  Output 850 ml  Net -119.41 ml   Filed Weights   07/20/18 0914  Weight: 102.3 kg    Examination:  General exam: Alert, awake, oriented x 3 Respiratory system: coarse rhonchi. Respiratory effort normal. Cardiovascular system: normal s1,s2 sounds.  No murmurs, rubs, gallops. Gastrointestinal system: Abdomen is nondistended, soft and nontender. No organomegaly or masses felt. Normal bowel sounds heard. Central nervous system: Alert and oriented. No focal neurological deficits. Extremities: No C/C/E, +pedal pulses Skin: No rashes, lesions or ulcers Psychiatry: Judgement and insight appear normal. Mood & affect appropriate.   Data Reviewed: I have personally reviewed following labs and imaging studies  CBC: Recent Labs  Lab 07/20/18 0928 07/21/18 0554 07/22/18 0959 07/23/18 0714 07/24/18 0725 07/25/18 0456  WBC 9.6 12.0* 16.7* 16.5* 17.5* 16.7*  NEUTROABS 6.7  --   --   --   --  14.9*  HGB 12.4* 12.4* 12.3* 11.3* 11.7* 13.2  HCT 37.6* 38.6* 38.7* 35.4* 36.5* 41.1  MCV 89.1 92.3 90.6 90.8 89.5 89.2  PLT 243 215 196 210 195 223   Basic Metabolic Panel: Recent Labs  Lab 07/21/18 0554 07/22/18 0959 07/23/18 0714 07/24/18 0725 07/25/18 0456  NA 141 146* 146* 142 142  K 3.7 3.1* 4.1 3.1* 3.1*  CL 109 116* 114* 108 105  CO2 23 22 23 24 25   GLUCOSE 110* 148* 133* 111* 110*  BUN 45* 30* 27* 25* 23  CREATININE 2.21* 1.74* 1.55* 1.30* 1.25*  CALCIUM 8.5* 8.9 9.0 8.7* 9.0  MG  --  2.1  --  1.9 2.0  PHOS  --   --   --   --  1.8*   GFR: Estimated Creatinine Clearance: 68.1 mL/min (A) (by C-G formula based on SCr of 1.25 mg/dL (H)). Liver Function Tests: Recent Labs  Lab 07/20/18 0928 07/21/18 0554 07/22/18 0959 07/23/18 0714 07/25/18 0456  AST 19 26 22 21   --   ALT 23 25 25 22   --   ALKPHOS 130* 128* 115 104  --   BILITOT 0.6 1.3*  2.0* 1.5*  --   PROT 7.6 6.9 7.2 6.6  --   ALBUMIN 3.8 3.4* 3.2* 2.8* 3.0*   No results for input(s): LIPASE, AMYLASE in the last 168 hours. Recent Labs  Lab 07/20/18 0954 07/20/18 1815  AMMONIA 31 33   Coagulation Profile: Recent Labs  Lab 07/20/18 0928  INR 1.15   Cardiac Enzymes: Recent Labs  Lab 07/20/18 0928  CKTOTAL 189  TROPONINI 0.04*   BNP (last 3 results) No results for input(s): PROBNP in the last 8760 hours. HbA1C: No results for input(s): HGBA1C in the last 72 hours. CBG: Recent Labs  Lab 07/20/18 0917 07/21/18 1321  GLUCAP 115* 129*   Lipid Profile: No results for input(s): CHOL, HDL, LDLCALC, TRIG, CHOLHDL, LDLDIRECT in the last 72 hours. Thyroid Function Tests: No results for input(s): TSH, T4TOTAL, FREET4, T3FREE, THYROIDAB  in the last 72 hours. Anemia Panel: No results for input(s): VITAMINB12, FOLATE, FERRITIN, TIBC, IRON, RETICCTPCT in the last 72 hours. Sepsis Labs: Recent Labs  Lab 07/20/18 0954 07/20/18 1140  LATICACIDVEN 1.0 0.6    Recent Results (from the past 240 hour(s))  Urine culture     Status: None   Collection Time: 07/20/18  9:28 AM  Result Value Ref Range Status   Specimen Description   Final    URINE, CATHETERIZED Performed at [facility], [address] [place], [place] [address]    Special Requests   Final    NONE Performed at [facility], [address] [place], [place] [address]    Culture   Final    NO GROWTH Performed at [facility], [address] [place], [place] [address]    Report Status 07/21/2018 FINAL  Final  MRSA PCR Screening     Status: Abnormal   Collection Time: 07/20/18  5:34 PM  Result Value Ref Range Status   MRSA by PCR POSITIVE (A) NEGATIVE Final    Comment:        The GeneXpert MRSA Assay (FDA approved for NASAL specimens only), is one component of a comprehensive MRSA colonization surveillance program. It is not intended to diagnose MRSA infection nor to guide or monitor  treatment for MRSA infections. RESULT CALLED TO, READ BACK BY AND VERIFIED WITH: J.RHEW @ 2302 ON 1.17.20 BY LBB Performed at [facility], [address] [place], [place] [address]   Culture, blood (routine x 2)     Status: None (Preliminary result)   Collection Time: 07/21/18 12:12 PM  Result Value Ref Range Status   Specimen Description BLOOD LEFT WRIST  Final   Special Requests   Final    BOTTLES DRAWN AEROBIC AND ANAEROBIC Blood Culture adequate volume   Culture   Final    NO GROWTH 4 DAYS Performed at [facility], [address] [place], [place] [address]    Report Status PENDING  Incomplete  Culture, blood (routine x 2)     Status: None (Preliminary result)   Collection Time: 07/21/18 12:12 PM  Result Value Ref Range Status   Specimen Description BLOOD RIGHT HAND  Final   Special Requests   Final    BOTTLES DRAWN AEROBIC AND ANAEROBIC Blood Culture adequate volume   Culture   Final    NO GROWTH 4 DAYS Performed at [facility], [address] [place], [place] [address]    Report Status PENDING  Incomplete  C difficile quick scan w PCR reflex     Status: None   Collection Time: 07/25/18  9:44 AM  Result Value Ref Range Status   C Diff antigen NEGATIVE NEGATIVE Final   C Diff toxin NEGATIVE NEGATIVE Final   C Diff interpretation No C. difficile detected.  Final    Comment: Performed at [facility], [address] [place], [place] [address]    Radiology Studies: Ct Head Wo Contrast  Result Date: 07/24/2018 CLINICAL DATA:  73 y/o M; found on floor of bathroom. Altered level of consciousness. EXAM: CT HEAD WITHOUT CONTRAST TECHNIQUE: Contiguous axial images were obtained from the base of the skull through the vertex without intravenous contrast. COMPARISON:  07/20/2018 05/08/2018 CT head. FINDINGS: Brain: No evidence of acute infarction, hemorrhage, hydrocephalus, extra-axial collection or mass lesion/mass effect. Stable nonspecific white matter hypodensities compatible  with chronic microvascular ischemic changes. Stable volume loss of the brain. Vascular: Calcific atherosclerosis of the carotid siphons.  No hyperdense vessel identified Skull: Normal. Negative for fracture or focal lesion. Sinuses/Orbits: No acute finding. Other: Partially visualized cranial cervical fusion hardware. IMPRESSION: 1. No acute intracranial abnormality or calvarial fracture identified. 2. Stable chronic microvascular ischemic changes and volume loss of the brain. Electronically Signed   By: Mitzi Hansen M.D.   On: 07/24/2018 23:07   Ct Abdomen Pelvis W Contrast  Result Date: 07/25/2018 CLINICAL DATA:  Generalized weakness with loss of appetite. Abdominal pain. EXAM: CT ABDOMEN AND PELVIS WITH CONTRAST TECHNIQUE: Multidetector CT imaging of the abdomen and pelvis was performed using the standard protocol following bolus administration of intravenous contrast. CONTRAST:  ISOVUE-300 IOPAMIDOL (ISOVUE-300) INJECTION 61% COMPARISON:  None. FINDINGS: Lower chest: Heart is enlarged. Patchy airspace disease is identified in both lower lungs, left greater than right suggesting pneumonia. Hepatobiliary: No suspicious focal abnormality within the liver parenchyma. Gallbladder surgically absent. No intrahepatic or extrahepatic biliary dilation. Pancreas: No focal mass lesion. No dilatation of the main duct. No intraparenchymal cyst. No peripancreatic edema. Spleen: No splenomegaly. No focal mass lesion. Adrenals/Urinary Tract: No adrenal nodule or mass. Kidneys unremarkable. No evidence for hydroureter. Anterior bladder wall appears tethered to the anterior peritoneum of the lower anterior abdominal wall but is otherwise unremarkable. Stomach/Bowel: Stomach is distended with contrast material. Duodenum is normally positioned as is the ligament of Treitz. Mild circumferential duodenal wall thickening evident (39/2) with subtle periduodenal edema/inflammation. No small bowel wall thickening. No  small bowel dilatation. The terminal ileum is normal. The appendix is normal. No gross colonic mass. No colonic wall thickening. Vascular/Lymphatic: There is abdominal aortic atherosclerosis without aneurysm. There is no gastrohepatic or hepatoduodenal ligament lymphadenopathy. No intraperitoneal or retroperitoneal lymphadenopathy. No pelvic sidewall lymphadenopathy. Reproductive: The prostate gland and seminal vesicles are unremarkable. Other: No intraperitoneal free fluid. Musculoskeletal: No worrisome lytic or sclerotic osseous abnormality. Thoracic spinal stimulator device evident. T10 inferior endplate compression fracture new since 03/14/2018. IMPRESSION: 1. Mild circumferential duodenal wall thickening with subtle periduodenal edema/inflammation. Imaging features suggest duodenitis. 2. Patchy airspace disease in both lower lungs, left greater than right, compatible with pneumonia. 3. T10 inferior endplate compression fracture new since 03/14/2018. 4.  Aortic Atherosclerois (ICD10-170.0) Electronically Signed   By: Kennith Center M.D.   On: 07/25/2018 13:28   Dg Abd Portable 1v  Result Date: 07/24/2018 CLINICAL DATA:  Nausea and vomiting EXAM: PORTABLE ABDOMEN - 1 VIEW COMPARISON:  07/20/2018 FINDINGS: Ascending thoracic stimulator leads. Surgical clips in the right upper quadrant. Borderline dilated gas-filled left lower quadrant small bowel loops with overall increased small bowel gas. Phleboliths in the pelvis. IMPRESSION: 1. Diffuse increased small bowel gas with scattered gas in the colon and borderline dilated loops of small bowel in the left lower quadrant. Findings could be secondary to enteritis/ileus. No definite radiographic evidence for high-grade bowel obstruction. Electronically Signed   By: Jasmine Pang M.D.   On: 07/24/2018 19:26    Scheduled Meds: . amLODipine  10 mg Oral Daily  . amoxicillin-clavulanate  1 tablet Oral Q12H  . Chlorhexidine Gluconate Cloth  6 each Topical Q0600  .  cloNIDine  0.2 mg Transdermal Weekly  . enoxaparin (LOVENOX) injection  30 mg Subcutaneous Q24H  . folic acid  1 mg Oral Daily  . guaiFENesin  1,200 mg Oral BID  . multivitamin with minerals  1 tablet Oral Daily  . mupirocin ointment  1 application Nasal BID  . saccharomyces boulardii  250 mg Oral BID  . sertraline  100 mg Oral Daily  .  tamsulosin  0.4 mg Oral Daily  . thiamine  100 mg Oral Daily   Or  . thiamine  100 mg Intravenous Daily   Continuous Infusions: . 0.9 % NaCl with KCl 20 mEq / L       LOS: 5 days   Time spent: 35 minutes  Standley Dakins, MD [facility]   If 7PM-7AM, please contact night-coverage [email or web address]  07/25/2018, 6:11 PM

## 2018-07-26 LAB — CBC WITH DIFFERENTIAL/PLATELET
Abs Immature Granulocytes: 0.19 10*3/uL — ABNORMAL HIGH (ref 0.00–0.07)
BASOS ABS: 0 10*3/uL (ref 0.0–0.1)
Basophils Relative: 0 %
EOS PCT: 1 %
Eosinophils Absolute: 0.1 10*3/uL (ref 0.0–0.5)
HCT: 37.1 % — ABNORMAL LOW (ref 39.0–52.0)
Hemoglobin: 12.4 g/dL — ABNORMAL LOW (ref 13.0–17.0)
Immature Granulocytes: 2 %
Lymphocytes Relative: 10 %
Lymphs Abs: 1.2 10*3/uL (ref 0.7–4.0)
MCH: 29.5 pg (ref 26.0–34.0)
MCHC: 33.4 g/dL (ref 30.0–36.0)
MCV: 88.3 fL (ref 80.0–100.0)
Monocytes Absolute: 0.6 10*3/uL (ref 0.1–1.0)
Monocytes Relative: 5 %
NRBC: 0 % (ref 0.0–0.2)
Neutro Abs: 9.9 10*3/uL — ABNORMAL HIGH (ref 1.7–7.7)
Neutrophils Relative %: 82 %
Platelets: 201 10*3/uL (ref 150–400)
RBC: 4.2 MIL/uL — ABNORMAL LOW (ref 4.22–5.81)
RDW: 13.1 % (ref 11.5–15.5)
WBC: 12.1 10*3/uL — ABNORMAL HIGH (ref 4.0–10.5)

## 2018-07-26 LAB — RENAL FUNCTION PANEL
Albumin: 2.7 g/dL — ABNORMAL LOW (ref 3.5–5.0)
Anion gap: 5 (ref 5–15)
BUN: 23 mg/dL (ref 8–23)
CALCIUM: 8.3 mg/dL — AB (ref 8.9–10.3)
CO2: 22 mmol/L (ref 22–32)
Chloride: 109 mmol/L (ref 98–111)
Creatinine, Ser: 1.1 mg/dL (ref 0.61–1.24)
GFR calc Af Amer: >60 mL/min (ref >60)
GFR calc non Af Amer: >60 mL/min (ref >60)
Glucose, Bld: 96 mg/dL (ref 70–99)
Phosphorus: 3.1 mg/dL (ref 2.5–4.6)
Potassium: 3.7 mmol/L (ref 3.5–5.1)
Sodium: 136 mmol/L (ref 135–145)

## 2018-07-26 LAB — CULTURE, BLOOD (ROUTINE X 2)
CULTURE: NO GROWTH
Culture: NO GROWTH
Special Requests: ADEQUATE
Special Requests: ADEQUATE

## 2018-07-26 LAB — MAGNESIUM: Magnesium: 1.9 mg/dL (ref 1.7–2.4)

## 2018-07-26 MED ORDER — HYDRALAZINE HCL 25 MG PO TABS
25.0000 mg | ORAL_TABLET | Freq: Three times a day (TID) | ORAL | Status: DC
  Administered 2018-07-26 – 2018-07-27 (×4): 25 mg via ORAL
  Filled 2018-07-26 (×3): qty 1

## 2018-07-26 NOTE — Progress Notes (Signed)
PROGRESS NOTE   RAJINDER DOBBERSTEIN  ZYY:[id number] DOB: 11-02-1945 DOA: 07/20/2018 PCP: Vivien Presto, MD   Brief Narrative:  73 year old male with a history of chronic pain syndrome, hypertension, multiple falls, brought to the hospital with worsening mental status, decreased p.o. intake and repeated falls.  He was found to have dehydration and acute renal failure.  It was felt the change in mental status likely related to medications in the setting of renal failure.  Hospital course has been complicated by development of fever and chest x-ray indicating developing pneumonia.  He has been started on intravenous antibiotics.  Assessment & Plan:   Active Problems:   Chronic pain syndrome   Essential hypertension   Ankylosing spondylitis (HCC)   Acute encephalopathy   Acute kidney injury (HCC)   Dehydration   Opiate overdose (HCC)   Aspiration pneumonia of both lower lobes due to gastric secretions (HCC)   Acute respiratory failure with hypoxia (HCC)   Compression fracture of T10 vertebra (HCC)   Duodenitis  1. Acute encephalopathy. RESOLVED.  Related to opiate use as well as other sedative medications in the setting of renal failure.  He initially did respond to Narcan in the emergency room.  Overall mental status has substantially improved and he is now able to carry on a conversation.  Continue to follow.  Holding all sedative medications.  CT imaging of the head and neck on admission was unrevealing.  He is slowly improving.  PT recommending SNF placement.  2. Acute kidney injury.  IMPROVING with gently hydration.  Likely related to dehydration, ARB use and Lasix use.  Nephrotoxic agents currently on hold.  He has been treated with gentle hydration.  Renal function is slowly improving. 3. Hypokalemia - IV replacement ordered, checking magnesium.  Recheck renal function panel in AM.  4. Unintentional opiate overdose.  The patient takes MS Contin amongst other sedatives.  Overall  mental status is slowly improving.  Continue to monitor.  Opioids have not been restarted.   5. Acute respiratory failure.  RESOLVED.  Related to pneumonia.  He is on supplemental oxygen.  We will try and wean off as tolerated. 6. Diarrhea - c diff negative, added lomotil and added probiotics - florastor BID.  DC antibiotics 1/23.  7. Aspiration Pneumonia, likely aspiration related.  Patient was noted to have fever and had bilateral rhonchi.  Chest x-ray indicated developing pneumonia.  He is currently on oral augmentin.  Influenza panel is negative.  Blood cultures have shown no growth.   SLP evaluation requested.  8. Abdominal pain - nausea CT suggesting duodenitis - IV protonix ordered.  9. T10 compression fracture - TLSO brace ordered.  10. Hypertension.  Holding losartan/Lasix  Blood pressures are currently stable on amlodipine.adding hydralazine for better control.   11. Chronic pain syndrome.  He will need to follow-up with his pain clinic.  I asked wife to have PCP make referral to Dr. Ronal Fear office for chronic pain management.  Also asked to have records sent to office for them to review.   12. Hypokalemia.  Replaced.  Magnesium normal 13. Generalized weakness.  Physical therapy recommending SNF placement.  14. Urinary retention.  Has required in and out catheterization x2.  Possibly related to immobility/anticholinergic effects of medications.  He is also been restarted on Flomax.  DVT prophylaxis: Lovenox Code Status: Full code Family Communication: Discussed with wife (telephone) Disposition Plan: SNF placement   Consultants:     Procedures:     Antimicrobials:  Zosyn 1/18 >1/22  augmentin 1/22-1/23  Subjective: Patient continues to have loose stools but slowing down.   He remains very weak.    Objective: Vitals:   07/25/18 1559 07/25/18 2142 07/26/18 0055 07/26/18 0607  BP: (!) 163/74 (!) 162/89 (!) 168/81 (!) 162/87  Pulse: 66 61 (!) 59 65  Resp: 20 (!) 24 (!)  24 (!) 24  Temp: 97.8 F (36.6 C) 98.9 F (37.2 C) 98.9 F (37.2 C) 98.8 F (37.1 C)  TempSrc:  Oral Oral Oral  SpO2: 95% 98% 94% 92%  Weight:        Intake/Output Summary (Last 24 hours) at 07/26/2018 1526 Last data filed at 07/26/2018 1300 Gross per 24 hour  Intake 1202.05 ml  Output 2700 ml  Net -1497.95 ml   Filed Weights   07/20/18 0914  Weight: 102.3 kg   Examination:  General exam: Alert, awake, oriented x 3 Respiratory system: coarse rhonchi. Respiratory effort normal. Cardiovascular system: normal s1,s2 sounds.  No murmurs, rubs, gallops. Gastrointestinal system: Abdomen is nondistended, soft and nontender. No organomegaly or masses felt. Normal bowel sounds heard. Central nervous system: Alert and oriented. No focal neurological deficits. Extremities: No C/C/E, +pedal pulses Skin: No rashes, lesions or ulcers Psychiatry: Judgement and insight appear normal. Mood & affect appropriate.   Data Reviewed: I have personally reviewed following labs and imaging studies  CBC: Recent Labs  Lab 07/20/18 0928  07/22/18 0959 07/23/18 0714 07/24/18 0725 07/25/18 0456 07/26/18 0459  WBC 9.6   < > 16.7* 16.5* 17.5* 16.7* 12.1*  NEUTROABS 6.7  --   --   --   --  14.9* 9.9*  HGB 12.4*   < > 12.3* 11.3* 11.7* 13.2 12.4*  HCT 37.6*   < > 38.7* 35.4* 36.5* 41.1 37.1*  MCV 89.1   < > 90.6 90.8 89.5 89.2 88.3  PLT 243   < > 196 210 195 223 201   < > = values in this interval not displayed.   Basic Metabolic Panel: Recent Labs  Lab 07/22/18 0959 07/23/18 0714 07/24/18 0725 07/25/18 0456 07/26/18 0459  NA 146* 146* 142 142 136  K 3.1* 4.1 3.1* 3.1* 3.7  CL 116* 114* 108 105 109  CO2 22 23 24 25 22   GLUCOSE 148* 133* 111* 110* 96  BUN 30* 27* 25* 23 23  CREATININE 1.74* 1.55* 1.30* 1.25* 1.10  CALCIUM 8.9 9.0 8.7* 9.0 8.3*  MG 2.1  --  1.9 2.0 1.9  PHOS  --   --   --  1.8* 3.1   GFR: Estimated Creatinine Clearance: 77.3 mL/min (by C-G formula based on SCr of 1.1  mg/dL). Liver Function Tests: Recent Labs  Lab 07/20/18 0928 07/21/18 0554 07/22/18 0959 07/23/18 0714 07/25/18 0456 07/26/18 0459  AST 19 26 22 21   --   --   ALT 23 25 25 22   --   --   ALKPHOS 130* 128* 115 104  --   --   BILITOT 0.6 1.3* 2.0* 1.5*  --   --   PROT 7.6 6.9 7.2 6.6  --   --   ALBUMIN 3.8 3.4* 3.2* 2.8* 3.0* 2.7*   No results for input(s): LIPASE, AMYLASE in the last 168 hours. Recent Labs  Lab 07/20/18 0954 07/20/18 1815  AMMONIA 31 33   Coagulation Profile: Recent Labs  Lab 07/20/18 0928  INR 1.15   Cardiac Enzymes: Recent Labs  Lab 07/20/18 0928  CKTOTAL 189  TROPONINI 0.04*  BNP (last 3 results) No results for input(s): PROBNP in the last 8760 hours. HbA1C: No results for input(s): HGBA1C in the last 72 hours. CBG: Recent Labs  Lab 07/20/18 0917 07/21/18 1321  GLUCAP 115* 129*   Lipid Profile: No results for input(s): CHOL, HDL, LDLCALC, TRIG, CHOLHDL, LDLDIRECT in the last 72 hours. Thyroid Function Tests: No results for input(s): TSH, T4TOTAL, FREET4, T3FREE, THYROIDAB in the last 72 hours. Anemia Panel: No results for input(s): VITAMINB12, FOLATE, FERRITIN, TIBC, IRON, RETICCTPCT in the last 72 hours. Sepsis Labs: Recent Labs  Lab 07/20/18 0954 07/20/18 1140  LATICACIDVEN 1.0 0.6    Recent Results (from the past 240 hour(s))  Urine culture     Status: None   Collection Time: 07/20/18  9:28 AM  Result Value Ref Range Status   Specimen Description   Final    URINE, CATHETERIZED Performed at [facility], [address] [place], [place] [address]    Special Requests   Final    NONE Performed at [facility], [address] [place], [place] [address]    Culture   Final    NO GROWTH Performed at [facility], [address] [place], [place] [address]    Report Status 07/21/2018 FINAL  Final  MRSA PCR Screening     Status: Abnormal   Collection Time: 07/20/18  5:34 PM  Result Value Ref Range Status   MRSA by PCR  POSITIVE (A) NEGATIVE Final    Comment:        The GeneXpert MRSA Assay (FDA approved for NASAL specimens only), is one component of a comprehensive MRSA colonization surveillance program. It is not intended to diagnose MRSA infection nor to guide or monitor treatment for MRSA infections. RESULT CALLED TO, READ BACK BY AND VERIFIED WITH: J.RHEW @ 2302 ON 1.17.20 BY LBB Performed at [facility], [address] [place], [place] [address]   Culture, blood (routine x 2)     Status: None   Collection Time: 07/21/18 12:12 PM  Result Value Ref Range Status   Specimen Description BLOOD LEFT WRIST  Final   Special Requests   Final    BOTTLES DRAWN AEROBIC AND ANAEROBIC Blood Culture adequate volume   Culture   Final    NO GROWTH 5 DAYS Performed at [facility], [address] [place], [place] [address]    Report Status 07/26/2018 FINAL  Final  Culture, blood (routine x 2)     Status: None   Collection Time: 07/21/18 12:12 PM  Result Value Ref Range Status   Specimen Description BLOOD RIGHT HAND  Final   Special Requests   Final    BOTTLES DRAWN AEROBIC AND ANAEROBIC Blood Culture adequate volume   Culture   Final    NO GROWTH 5 DAYS Performed at [facility], [address] [place], [place] [address]    Report Status 07/26/2018 FINAL  Final  C difficile quick scan w PCR reflex     Status: None   Collection Time: 07/25/18  9:44 AM  Result Value Ref Range Status   C Diff antigen NEGATIVE NEGATIVE Final   C Diff toxin NEGATIVE NEGATIVE Final   C Diff interpretation No C. difficile detected.  Final    Comment: Performed at [facility], [address] [place], [place] [address]    Radiology Studies: Ct Head Wo Contrast  Result Date: 07/24/2018 CLINICAL DATA:  73 y/o M; found on floor of bathroom. Altered level of consciousness. EXAM: CT  HEAD WITHOUT CONTRAST TECHNIQUE: Contiguous axial images were obtained from the base of the skull through the vertex without intravenous  contrast. COMPARISON:  07/20/2018 05/08/2018 CT head. FINDINGS: Brain: No evidence of acute infarction, hemorrhage, hydrocephalus, extra-axial collection or mass lesion/mass effect. Stable nonspecific white matter hypodensities compatible with chronic microvascular ischemic changes. Stable volume loss of the brain. Vascular: Calcific atherosclerosis of the carotid siphons. No hyperdense vessel identified Skull: Normal. Negative for fracture or focal lesion. Sinuses/Orbits: No acute finding. Other: Partially visualized cranial cervical fusion hardware. IMPRESSION: 1. No acute intracranial abnormality or calvarial fracture identified. 2. Stable chronic microvascular ischemic changes and volume loss of the brain. Electronically Signed   By: Mitzi Hansen M.D.   On: 07/24/2018 23:07   Ct Abdomen Pelvis W Contrast  Result Date: 07/25/2018 CLINICAL DATA:  Generalized weakness with loss of appetite. Abdominal pain. EXAM: CT ABDOMEN AND PELVIS WITH CONTRAST TECHNIQUE: Multidetector CT imaging of the abdomen and pelvis was performed using the standard protocol following bolus administration of intravenous contrast. CONTRAST:  ISOVUE-300 IOPAMIDOL (ISOVUE-300) INJECTION 61% COMPARISON:  None. FINDINGS: Lower chest: Heart is enlarged. Patchy airspace disease is identified in both lower lungs, left greater than right suggesting pneumonia. Hepatobiliary: No suspicious focal abnormality within the liver parenchyma. Gallbladder surgically absent. No intrahepatic or extrahepatic biliary dilation. Pancreas: No focal mass lesion. No dilatation of the main duct. No intraparenchymal cyst. No peripancreatic edema. Spleen: No splenomegaly. No focal mass lesion. Adrenals/Urinary Tract: No adrenal nodule or mass. Kidneys unremarkable. No evidence for hydroureter. Anterior bladder wall appears tethered to the anterior peritoneum of the lower anterior abdominal wall but is otherwise unremarkable. Stomach/Bowel: Stomach  is distended with contrast material. Duodenum is normally positioned as is the ligament of Treitz. Mild circumferential duodenal wall thickening evident (39/2) with subtle periduodenal edema/inflammation. No small bowel wall thickening. No small bowel dilatation. The terminal ileum is normal. The appendix is normal. No gross colonic mass. No colonic wall thickening. Vascular/Lymphatic: There is abdominal aortic atherosclerosis without aneurysm. There is no gastrohepatic or hepatoduodenal ligament lymphadenopathy. No intraperitoneal or retroperitoneal lymphadenopathy. No pelvic sidewall lymphadenopathy. Reproductive: The prostate gland and seminal vesicles are unremarkable. Other: No intraperitoneal free fluid. Musculoskeletal: No worrisome lytic or sclerotic osseous abnormality. Thoracic spinal stimulator device evident. T10 inferior endplate compression fracture new since 03/14/2018. IMPRESSION: 1. Mild circumferential duodenal wall thickening with subtle periduodenal edema/inflammation. Imaging features suggest duodenitis. 2. Patchy airspace disease in both lower lungs, left greater than right, compatible with pneumonia. 3. T10 inferior endplate compression fracture new since 03/14/2018. 4.  Aortic Atherosclerois (ICD10-170.0) Electronically Signed   By: Kennith Center M.D.   On: 07/25/2018 13:28   Dg Abd Portable 1v  Result Date: 07/24/2018 CLINICAL DATA:  Nausea and vomiting EXAM: PORTABLE ABDOMEN - 1 VIEW COMPARISON:  07/20/2018 FINDINGS: Ascending thoracic stimulator leads. Surgical clips in the right upper quadrant. Borderline dilated gas-filled left lower quadrant small bowel loops with overall increased small bowel gas. Phleboliths in the pelvis. IMPRESSION: 1. Diffuse increased small bowel gas with scattered gas in the colon and borderline dilated loops of small bowel in the left lower quadrant. Findings could be secondary to enteritis/ileus. No definite radiographic evidence for high-grade bowel  obstruction. Electronically Signed   By: Jasmine Pang M.D.   On: 07/24/2018 19:26   Scheduled Meds: . amLODipine  10 mg Oral Daily  . amoxicillin-clavulanate  1 tablet Oral Q12H  . Chlorhexidine Gluconate Cloth  6 each Topical Q0600  . cloNIDine  0.2 mg  Transdermal Weekly  . enoxaparin (LOVENOX) injection  30 mg Subcutaneous Q24H  . folic acid  1 mg Oral Daily  . guaiFENesin  1,200 mg Oral BID  . multivitamin with minerals  1 tablet Oral Daily  . mupirocin ointment  1 application Nasal BID  . pantoprazole (PROTONIX) IV  40 mg Intravenous Q12H  . saccharomyces boulardii  250 mg Oral BID  . sertraline  100 mg Oral Daily  . tamsulosin  0.4 mg Oral Daily  . thiamine  100 mg Oral Daily   Or  . thiamine  100 mg Intravenous Daily   Continuous Infusions: . 0.9 % NaCl with KCl 20 mEq / L 50 mL/hr at 07/26/18 0813    LOS: 6 days   Time spent: 27 minutes  Standley Dakinslanford Johnson, MD [facility]  If 7PM-7AM, please contact night-coverage [email or web address]  07/26/2018, 3:26 PM

## 2018-07-27 DIAGNOSIS — R112 Nausea with vomiting, unspecified: Secondary | ICD-10-CM

## 2018-07-27 DIAGNOSIS — F1190 Opioid use, unspecified, uncomplicated: Secondary | ICD-10-CM

## 2018-07-27 DIAGNOSIS — R197 Diarrhea, unspecified: Secondary | ICD-10-CM

## 2018-07-27 LAB — CBC WITH DIFFERENTIAL/PLATELET
Abs Immature Granulocytes: 0.21 10*3/uL — ABNORMAL HIGH (ref 0.00–0.07)
BASOS PCT: 0 %
Basophils Absolute: 0 10*3/uL (ref 0.0–0.1)
Eosinophils Absolute: 0.3 10*3/uL (ref 0.0–0.5)
Eosinophils Relative: 3 %
HCT: 34.3 % — ABNORMAL LOW (ref 39.0–52.0)
Hemoglobin: 11.1 g/dL — ABNORMAL LOW (ref 13.0–17.0)
Immature Granulocytes: 2 %
Lymphocytes Relative: 13 %
Lymphs Abs: 1.4 10*3/uL (ref 0.7–4.0)
MCH: 28.5 pg (ref 26.0–34.0)
MCHC: 32.4 g/dL (ref 30.0–36.0)
MCV: 88.2 fL (ref 80.0–100.0)
Monocytes Absolute: 0.6 10*3/uL (ref 0.1–1.0)
Monocytes Relative: 5 %
NEUTROS ABS: 8.1 10*3/uL — AB (ref 1.7–7.7)
Neutrophils Relative %: 77 %
Platelets: 199 10*3/uL (ref 150–400)
RBC: 3.89 MIL/uL — ABNORMAL LOW (ref 4.22–5.81)
RDW: 13.1 % (ref 11.5–15.5)
WBC: 10.5 10*3/uL (ref 4.0–10.5)
nRBC: 0 % (ref 0.0–0.2)

## 2018-07-27 LAB — BASIC METABOLIC PANEL
Anion gap: 6 (ref 5–15)
BUN: 19 mg/dL (ref 8–23)
CO2: 22 mmol/L (ref 22–32)
Calcium: 8.2 mg/dL — ABNORMAL LOW (ref 8.9–10.3)
Chloride: 109 mmol/L (ref 98–111)
Creatinine, Ser: 1.22 mg/dL (ref 0.61–1.24)
GFR calc Af Amer: >60 mL/min (ref >60)
GFR calc non Af Amer: 60 mL/min — ABNORMAL LOW (ref >60)
Glucose, Bld: 102 mg/dL — ABNORMAL HIGH (ref 70–99)
Potassium: 3.7 mmol/L (ref 3.5–5.1)
Sodium: 137 mmol/L (ref 135–145)

## 2018-07-27 LAB — MAGNESIUM: Magnesium: 1.9 mg/dL (ref 1.7–2.4)

## 2018-07-27 MED ORDER — PANTOPRAZOLE SODIUM 40 MG PO TBEC: 40.0000 mg | DELAYED_RELEASE_TABLET | Freq: Every day | ORAL | Status: DC

## 2018-07-27 MED ORDER — POTASSIUM CHLORIDE IN NACL 20-0.9 MEQ/L-% IV SOLN
INTRAVENOUS | Status: DC
  Administered 2018-07-27: 11:00:00 via INTRAVENOUS

## 2018-07-27 MED ORDER — HYDRALAZINE HCL 25 MG PO TABS
50.0000 mg | ORAL_TABLET | Freq: Three times a day (TID) | ORAL | Status: DC
  Administered 2018-07-27 – 2018-07-31 (×11): 50 mg via ORAL
  Filled 2018-07-27 (×12): qty 2

## 2018-07-27 MED ORDER — PANTOPRAZOLE SODIUM 40 MG PO TBEC
40.0000 mg | DELAYED_RELEASE_TABLET | Freq: Two times a day (BID) | ORAL | Status: DC
  Administered 2018-07-27 – 2018-07-31 (×8): 40 mg via ORAL
  Filled 2018-07-27 (×8): qty 1

## 2018-07-27 MED ORDER — ONDANSETRON HCL 4 MG/2ML IJ SOLN
4.0000 mg | Freq: Three times a day (TID) | INTRAMUSCULAR | Status: DC
  Administered 2018-07-27 – 2018-07-31 (×16): 4 mg via INTRAVENOUS
  Filled 2018-07-27 (×15): qty 2

## 2018-07-27 MED ORDER — DICYCLOMINE HCL 10 MG PO CAPS
10.0000 mg | ORAL_CAPSULE | Freq: Two times a day (BID) | ORAL | Status: DC
  Administered 2018-07-27 – 2018-07-31 (×9): 10 mg via ORAL
  Filled 2018-07-27 (×9): qty 1

## 2018-07-27 NOTE — Consult Note (Addendum)
Referring Provider: No ref. provider found Primary Care Physician:  Vivien Presto, MD Primary Gastroenterologist:  Genia Harold ([facility])  Date of Admission: 07/20/2018 Date of Consultation: 07/27/2018  Reason for Consultation:  Persistent diarrhea  HPI:  [name] is a 73 y.o. male with a past medical history of alcohol use, chronic pain, chronic opioid therapy, chronic first-degree AV block, hypertension, multiple falls.  He presented to the emergency department 07/20/2018 by EMS for altered mental status.  Patient's wife noticed decline over the previous 2 weeks.  He has a history of frequent falls.  Noted creatinine 3.73, troponin 0 0.04, hemoglobin 12.4.  Baseline creatinine known to be 1.2-1.28 recently.  IV Narcan given with good effect and the patient became more awake and alert.  The patient's BP began dropping again he became somnolent and IV Narcan was again given with good effect.  CT head, CT cervical spine, lumbar x-ray, chest x-ray did not show any acute findings.  He was admitted for monitoring and treatment of acute kidney injury.  His hospitalization became further complicated by fever and chest x-ray indicated developing pneumonia and he was started on antibiotics.  Over the course of the previous several days his acute encephalopathy resolved, acute kidney injury improving with gentle hydration and likely related to dehydration, ARB use, Lasix use; acute respiratory failure resolved and likely related to pneumonia.  The patient developed diarrhea during his hospital stay and C. difficile is negative.  Lomotil and probiotics were added, antibiotics discontinued 07/26/2018.  Due to persistent uncontrollable diarrhea GI was consulted.  Today he states he does not have a history of chronic diarrhea.  He actually tends to be more constipated.  His diarrhea started a few days ago, shortly after starting antibiotics for his pneumonia.  He is having 6-8 stools a day which are  loose.  He has developed a rash of sorts in his perineal area and it is very tender "I cried every time he had the kalemia."  Subsequently nursing staff got an order for him put in a rectal tube which is helped give him some relief.  Some mild abdominal tenderness, some intermittent nausea.  No vomiting in several days.  No fevers.  He is on a soft diet and tolerating this well.  Denies any other GI symptoms.  Past Medical History:  Diagnosis Date  . 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 Ankylosing spondylitis of multiple sites in spine (HCC)   . Chronic pain   . Chronically on opiate therapy    Pain Management d/c opiates 06/2018  . First degree AV block    chronic  . Gait instability   . Hypertension   . Multiple falls   . OSA (obstructive sleep apnea)   . Pain management   . Peripheral axonal neuropathy   . Peripheral neuropathy 06/06/2018  . Tremor     Past Surgical History:  Procedure Laterality Date  . ANKLE SURGERY    . BACK SURGERY    . JOINT REPLACEMENT    . NECK SURGERY     cervical fusion  . SPINAL CORD STIMULATOR INSERTION  2017  . TOTAL KNEE ARTHROPLASTY      Prior to Admission medications   Medication Sig Start Date End Date Taking? Authorizing Provider  amLODipine (NORVASC) 10 MG tablet Take 10 mg by mouth daily.     Yes [provider]  cloNIDine (CATAPRES - DOSED IN MG/24 HR) 0.2 mg/24hr patch Place 0.2 mg onto the  skin once a week.  06/20/18  Yes [provider]  furosemide (LASIX) 40 MG tablet Take 40 mg by mouth every morning. 08/15/17  Yes [provider]  losartan (COZAAR) 100 MG tablet Take 100 mg by mouth every evening. 08/27/15  Yes [provider]  morphine (MS CONTIN) 30 MG 12 hr tablet Take 30 mg by mouth daily.    Yes [provider]  naproxen sodium (ALEVE) 220 MG tablet Take 440 mg by mouth daily as needed (for pain).   Yes [provider]  pregabalin (LYRICA) 150 MG capsule Take 150 mg by mouth 3  (three) times daily.     Yes [provider]  sertraline (ZOLOFT) 100 MG tablet Take 100 mg by mouth daily. 01/08/18  Yes [provider]  tamsulosin (FLOMAX) 0.4 MG CAPS capsule Take 1 capsule (0.4 mg total) by mouth daily after supper. 05/14/18  Yes Purohit, Salli QuarryShrey C, MD    Current Facility-Administered Medications  Medication Dose Route Frequency Provider Last Rate Last Dose  . 0.9 % NaCl with KCl 20 mEq/ L  infusion   Intravenous Continuous Johnson, Clanford L, MD 50 mL/hr at 07/27/18 1034    . acetaminophen (TYLENOL) tablet 650 mg  650 mg Oral Q6H PRN Erick BlinksMemon, Jehanzeb, MD   650 mg at 07/26/18 2256   Or  . acetaminophen (TYLENOL) suppository 650 mg  650 mg Rectal Q6H PRN Erick BlinksMemon, Jehanzeb, MD   650 mg at 07/22/18 1229  . albuterol (PROVENTIL) (2.5 MG/3ML) 0.083% nebulizer solution 2.5 mg  2.5 mg Nebulization Q4H PRN Meredeth IdeLama, Gagan S, MD      . amLODipine (NORVASC) tablet 10 mg  10 mg Oral Daily Erick BlinksMemon, Jehanzeb, MD   10 mg at 07/27/18 1030  . cloNIDine (CATAPRES - Dosed in mg/24 hr) patch 0.2 mg  0.2 mg Transdermal Weekly Johnson, Clanford L, MD   0.2 mg at 07/24/18 2030  . cloNIDine (CATAPRES) tablet 0.1 mg  0.1 mg Oral Q6H PRN Johnson-Pitts, Endia, MD   0.1 mg at 07/25/18 0448  . diphenoxylate-atropine (LOMOTIL) 2.5-0.025 MG per tablet 2 tablet  2 tablet Oral QID PRN Standley DakinsJohnson, Clanford L, MD   2 tablet at 07/26/18 1346  . enoxaparin (LOVENOX) injection 30 mg  30 mg Subcutaneous Q24H Erick BlinksMemon, Jehanzeb, MD   30 mg at 07/26/18 1753  . folic acid (FOLVITE) tablet 1 mg  1 mg Oral Daily Erick BlinksMemon, Jehanzeb, MD   1 mg at 07/27/18 1031  . guaiFENesin (MUCINEX) 12 hr tablet 1,200 mg  1,200 mg Oral BID Erick BlinksMemon, Jehanzeb, MD   1,200 mg at 07/27/18 1030  . hydrALAZINE (APRESOLINE) tablet 25 mg  25 mg Oral Q8H Johnson, Clanford L, MD   25 mg at 07/27/18 0718  . multivitamin with minerals tablet 1 tablet  1 tablet Oral Daily Erick BlinksMemon, Jehanzeb, MD   1 tablet at 07/27/18 1031  . mupirocin ointment  (BACTROBAN) 2 % 1 application  1 application Nasal BID Erick BlinksMemon, Jehanzeb, MD   1 application at 07/27/18 1037  . ondansetron (ZOFRAN) tablet 4 mg  4 mg Oral Q6H PRN Erick BlinksMemon, Jehanzeb, MD       Or  . ondansetron (ZOFRAN) injection 4 mg  4 mg Intravenous Q6H PRN Erick BlinksMemon, Jehanzeb, MD   4 mg at 07/24/18 1501  . pantoprazole (PROTONIX) injection 40 mg  40 mg Intravenous Q12H Johnson, Clanford L, MD   40 mg at 07/27/18 1029  . promethazine (PHENERGAN) injection 12.5 mg  12.5 mg Intravenous Q6H PRN  Johnson, Clanford L, MD   12.5 mg at 07/24/18 1656  . saccharomyces boulardii (FLORASTOR) capsule 250 mg  250 mg Oral BID Johnson, Clanford L, MD   250 mg at 07/27/18 1031  . sertraline (ZOLOFT) tablet 100 mg  100 mg Oral Daily Johnson, Clanford L, MD   100 mg at 07/27/18 1030  . tamsulosin (FLOMAX) capsule 0.4 mg  0.4 mg Oral Daily Erick Blinks, MD   0.4 mg at 07/27/18 1031  . thiamine (VITAMIN B-1) tablet 100 mg  100 mg Oral Daily Erick Blinks, MD   100 mg at 07/27/18 1039   Or  . thiamine (B-1) injection 100 mg  100 mg Intravenous Daily Erick Blinks, MD   100 mg at 07/20/18 1610   Facility-Administered Medications Ordered in Other Encounters  Medication Dose Route Frequency Provider Last Rate Last Dose  . ondansetron (ZOFRAN) 4 mg in sodium chloride 0.9 % 50 mL IVPB  4 mg Intravenous Q6H PRN Barnett Abu, MD        Allergies as of 07/20/2018 - Review Complete 07/20/2018  Allergen Reaction Noted  . Codeine Nausea Only     Family History  Problem Relation Age of Onset  . Heart disease Mother     Social History   Socioeconomic History  . Marital status: Married    Spouse name: Not on file  . Number of children: Not on file  . Years of education: Not on file  . Highest education level: Not on file  Occupational History  . Not on file  Social Needs  . Financial resource strain: Not on file  . Food insecurity:    Worry: Not on file    Inability: Not on file  . Transportation needs:     Medical: Not on file    Non-medical: Not on file  Tobacco Use  . Smoking status: Never Smoker  . Smokeless tobacco: Never Used  Substance and Sexual Activity  . Alcohol use: Yes  . Drug use: No  . Sexual activity: Not on file  Lifestyle  . Physical activity:    Days per week: Not on file    Minutes per session: Not on file  . Stress: Not on file  Relationships  . Social connections:    Talks on phone: Not on file    Gets together: Not on file    Attends religious service: Not on file    Active member of club or organization: Not on file    Attends meetings of clubs or organizations: Not on file    Relationship status: Not on file  . Intimate partner violence:    Fear of current or ex partner: Not on file    Emotionally abused: Not on file    Physically abused: Not on file    Forced sexual activity: Not on file  Other Topics Concern  . Not on file  Social History Narrative  . Not on file    Review of Systems: General: Negative for anorexia, weight loss, fever, chills, fatigue, weakness. ENT: Negative for hoarseness, difficulty swallowing. CV: Negative for chest pain, angina, palpitations, peripheral edema.  Respiratory: Negative for dyspnea at rest, cough, sputum, wheezing.  GI: See history of present illness. GU:  Negative for dysuria, hematuria, urinary incontinence, urinary frequency, nocturnal urination.  MS: Negative for joint pain, low back pain.  Derm: Perineal rash with excoriation and bleeding.  Endo: Negative for unusual weight change.  Heme: Negative for bruising. Allergy: Negative for rash or hives.  Physical Exam: Vital signs in last 24 hours: Temp:  [98.1 F (36.7 C)-98.8 F (37.1 C)] 98.8 F (37.1 C) (01/23 2220) Pulse Rate:  [57-60] 57 (01/24 0658) Resp:  [19-24] 24 (01/24 0658) BP: (153-164)/(73-78) 158/76 (01/24 0658) SpO2:  [93 %-95 %] 95 % (01/24 0658) Last BM Date: 07/26/18 General:   Alert,  Well-developed, well-nourished, pleasant and  cooperative in NAD Head:  Normocephalic and atraumatic. Eyes:  Sclera clear, no icterus. Conjunctiva pink. Ears:  Normal auditory acuity. Neck:  Supple; no masses or thyromegaly. Lungs:  Clear throughout to auscultation. No wheezes, crackles, or rhonchi. No acute distress. Heart:  Regular rate and rhythm; no murmurs, clicks, rubs, or gallops. Abdomen:  Soft, and nondistended. No worsening abdominal TTP. No masses, hepatosplenomegaly or hernias noted. Normal bowel sounds, without guarding, and without rebound.   Rectal:  Deferred.   Msk:  Symmetrical without gross deformities. Pulses:  Normal bilateral DP pulses noted. Extremities:  Without clubbing or edema. Neurologic:  Alert and  oriented x4;  grossly normal neurologically. Skin:  Intact without significant lesions or rashes. Psych:  Alert and cooperative. Normal mood and affect.  Intake/Output from previous day: 01/23 0701 - 01/24 0700 In: 1200 [P.O.:1200] Out: 1300 [Urine:1300] Intake/Output this shift: No intake/output data recorded.  Lab Results: Recent Labs    07/25/18 0456 07/26/18 0459 07/27/18 0450  WBC 16.7* 12.1* 10.5  HGB 13.2 12.4* 11.1*  HCT 41.1 37.1* 34.3*  PLT 223 201 199   BMET Recent Labs    07/25/18 0456 07/26/18 0459 07/27/18 0450  NA 142 136 137  K 3.1* 3.7 3.7  CL 105 109 109  CO2 25 22 22   GLUCOSE 110* 96 102*  BUN 23 23 19   CREATININE 1.25* 1.10 1.22  CALCIUM 9.0 8.3* 8.2*   LFT Recent Labs    07/25/18 0456 07/26/18 0459  ALBUMIN 3.0* 2.7*   PT/INR No results for input(s): LABPROT, INR in the last 72 hours. Hepatitis Panel No results for input(s): HEPBSAG, HCVAB, HEPAIGM, HEPBIGM in the last 72 hours. C-Diff Recent Labs    07/25/18 0944  CDIFFTOX NEGATIVE    Studies/Results: Ct Abdomen Pelvis W Contrast  Result Date: 07/25/2018 CLINICAL DATA:  Generalized weakness with loss of appetite. Abdominal pain. EXAM: CT ABDOMEN AND PELVIS WITH CONTRAST TECHNIQUE: Multidetector CT  imaging of the abdomen and pelvis was performed using the standard protocol following bolus administration of intravenous contrast. CONTRAST:  100mL ISOVUE-300 IOPAMIDOL (ISOVUE-300) INJECTION 61% COMPARISON:  None. FINDINGS: Lower chest: Heart is enlarged. Patchy airspace disease is identified in both lower lungs, left greater than right suggesting pneumonia. Hepatobiliary: No suspicious focal abnormality within the liver parenchyma. Gallbladder surgically absent. No intrahepatic or extrahepatic biliary dilation. Pancreas: No focal mass lesion. No dilatation of the main duct. No intraparenchymal cyst. No peripancreatic edema. Spleen: No splenomegaly. No focal mass lesion. Adrenals/Urinary Tract: No adrenal nodule or mass. Kidneys unremarkable. No evidence for hydroureter. Anterior bladder wall appears tethered to the anterior peritoneum of the lower anterior abdominal wall but is otherwise unremarkable. Stomach/Bowel: Stomach is distended with contrast material. Duodenum is normally positioned as is the ligament of Treitz. Mild circumferential duodenal wall thickening evident (39/2) with subtle periduodenal edema/inflammation. No small bowel wall thickening. No small bowel dilatation. The terminal ileum is normal. The appendix is normal. No gross colonic mass. No colonic wall thickening. Vascular/Lymphatic: There is abdominal aortic atherosclerosis without aneurysm. There is no gastrohepatic or hepatoduodenal ligament lymphadenopathy. No intraperitoneal or retroperitoneal lymphadenopathy. No pelvic sidewall lymphadenopathy.  Reproductive: The prostate gland and seminal vesicles are unremarkable. Other: No intraperitoneal free fluid. Musculoskeletal: No worrisome lytic or sclerotic osseous abnormality. Thoracic spinal stimulator device evident. T10 inferior endplate compression fracture new since 03/14/2018. IMPRESSION: 1. Mild circumferential duodenal wall thickening with subtle periduodenal edema/inflammation.  Imaging features suggest duodenitis. 2. Patchy airspace disease in both lower lungs, left greater than right, compatible with pneumonia. 3. T10 inferior endplate compression fracture new since 03/14/2018. 4.  Aortic Atherosclerois (ICD10-170.0) Electronically Signed   By: Kennith Center M.D.   On: 07/25/2018 13:28    Impression: Very pleasant 73 year old male admitted with altered mental status and acute kidney injury likely due to accidental overdose.  He subsequently developed pneumonia during his stay and was started on antibiotics (Augmentin) which were discontinued yesterday (07/26/2018). Overall his admitting diagnoses are improving or resolved.  However, he started having significant diarrhea with multiple stools a day which caused perineal rash and excoriation. Rectal tube is been placed.  C. difficile was negative. The hospitalist services started him on probiotics (Florastor).  Most likely antibiotic associated diarrhea.Less likely occult infection. He is already on Lomotil. Can consider further option when his medications have had time to take effect and pending clinical progression.  Plan: 1. Agree with Probiotics 2. Already on Lomotil 3. Monitor for any worsening 4. Supportive measures   Thank you for allowing [place] to participate in the care of Neil Rowan, DNP, AGNP-C Adult & Gerontological Nurse Practitioner [facility]    LOS: 7 days     07/27/2018, 11:37 AM

## 2018-07-27 NOTE — Progress Notes (Signed)
PROGRESS NOTE   [name]  ZOX:[id number] DOB: Aug 03, 1945 DOA: 07/20/2018 PCP: Vivien Presto, MD   Brief Narrative:  73 year old male with a history of chronic pain syndrome, hypertension, multiple falls, brought to the hospital with worsening mental status, decreased p.o. intake and repeated falls.  He was found to have dehydration and acute renal failure.  It was felt the change in mental status likely related to medications in the setting of renal failure.  Hospital course has been complicated by development of fever and chest x-ray indicating developing pneumonia.  He has been started on intravenous antibiotics.  Assessment & Plan:   Active Problems:   Chronic pain syndrome   Essential hypertension   Ankylosing spondylitis (HCC)   Acute encephalopathy   Acute kidney injury (HCC)   Dehydration   Opiate overdose (HCC)   Aspiration pneumonia of both lower lobes due to gastric secretions (HCC)   Acute respiratory failure with hypoxia (HCC)   Compression fracture of T10 vertebra (HCC)   Duodenitis   Nausea and vomiting   Opiate use   Diarrhea  1. Acute encephalopathy. RESOLVED.  Related to opiate use as well as other sedative medications in the setting of renal failure.  He initially did respond to Narcan in the emergency room.  Overall mental status has substantially improved and he is now able to carry on a conversation.  Continue to follow.  Holding all sedative medications.  CT imaging of the head and neck on admission was unrevealing.  He is slowly improving.  PT recommending SNF placement.  2. Acute kidney injury.  IMPROVING with gently hydration.  Likely related to dehydration, ARB use and Lasix use.  Nephrotoxic agents currently on hold.  He has been treated with gentle hydration.  Renal function is slowly improving. 3. Hypokalemia - IV replacement ordered, checking magnesium.  Recheck renal function panel in AM.  4. Unintentional opiate overdose.  The patient takes  MS Contin amongst other sedatives.  Overall mental status is slowly improving.  Continue to monitor.  Opioids have not been restarted.   5. Acute respiratory failure.  RESOLVED.  Related to pneumonia.  He is on supplemental oxygen.  We will try and wean off as tolerated. 6. Duondenal wall abnormality - Appreciate GI assistance, will need outpatient EGD in 3-4 weeks.   7. Diarrhea - c diff negative, added lomotil and added probiotics - florastor BID.  DC antibiotics 1/23. Appreciate GI team assistance.  Temporary rectal tube inserted, plan to remove in 1-2 days.  8. Aspiration Pneumonia, likely aspiration related. TREATED.  Patient was noted to have fever and had bilateral rhonchi.  Chest x-ray indicated developing pneumonia.  He is currently on oral augmentin.  Influenza panel is negative.  Blood cultures have shown no growth.   9. Abdominal pain - RESOLVED.  nausea CT suggesting duodenitis - IV protonix ordered.  10. T10 compression fracture - TLSO brace ordered. Awaiting for tech to fit patient with brace.  11. Hypertension.  Holding losartan/Lasix  Blood pressures remain elevated. Added hydralazine for better control.   12. Chronic pain syndrome.  He will need to follow-up with his pain clinic.  I asked wife to have PCP make referral to Dr. Ronal Fear office for chronic pain management.  Also asked to have records sent to office for them to review.   13. Hypokalemia.  Replaced.  Magnesium normal 14. Generalized weakness.  Physical therapy recommending SNF placement.  15. Urinary retention.  Has required in and out catheterization  x2.  Possibly related to immobility/anticholinergic effects of medications.  He is also been restarted on Flomax.  He seems to be urinating now.  Continue to monitor.    DVT prophylaxis: Lovenox Code Status: Full code Family Communication: Discussed with wife (telephone) Disposition Plan: SNF placement   Consultants:     Procedures:     Antimicrobials:   Zosyn  1/18 >1/22  augmentin 1/22-1/23  Subjective: Patient continues to have loose stools.  He is tolerating soft diet so far.     Objective: Vitals:   07/26/18 1614 07/26/18 2101 07/26/18 2220 07/27/18 0658  BP: (!) 153/73  (!) 164/78 (!) 158/76  Pulse: 60  (!) 59 (!) 57  Resp:   19 (!) 24  Temp: 98.1 F (36.7 C)  98.8 F (37.1 C)   TempSrc: Oral  Oral   SpO2: 94% 95% 93% 95%  Weight:        Intake/Output Summary (Last 24 hours) at 07/27/2018 1512 Last data filed at 07/27/2018 1400 Gross per 24 hour  Intake 1010.89 ml  Output 400 ml  Net 610.89 ml   Filed Weights   07/20/18 0914  Weight: 102.3 kg   Examination:  General exam: Alert, awake, oriented x 3 Respiratory system: coarse rhonchi. Respiratory effort normal. Cardiovascular system: normal s1,s2 sounds.  No murmurs, rubs, gallops. Gastrointestinal system: Abdomen is nondistended, soft and nontender. No organomegaly or masses felt. Normal bowel sounds heard. Central nervous system: Alert and oriented. No focal neurological deficits. Extremities: No C/C/E, +pedal pulses Skin: No rashes, lesions or ulcers Psychiatry: Judgement and insight appear normal. Mood & affect appropriate.   Data Reviewed: I have personally reviewed following labs and imaging studies  CBC: Recent Labs  Lab 07/23/18 0714 07/24/18 0725 07/25/18 0456 07/26/18 0459 07/27/18 0450  WBC 16.5* 17.5* 16.7* 12.1* 10.5  NEUTROABS  --   --  14.9* 9.9* 8.1*  HGB 11.3* 11.7* 13.2 12.4* 11.1*  HCT 35.4* 36.5* 41.1 37.1* 34.3*  MCV 90.8 89.5 89.2 88.3 88.2  PLT 210 195 223 201 199   Basic Metabolic Panel: Recent Labs  Lab 07/22/18 0959 07/23/18 0714 07/24/18 0725 07/25/18 0456 07/26/18 0459 07/27/18 0450  NA 146* 146* 142 142 136 137  K 3.1* 4.1 3.1* 3.1* 3.7 3.7  CL 116* 114* 108 105 109 109  CO2 22 23 24 25 22 22   GLUCOSE 148* 133* 111* 110* 96 102*  BUN 30* 27* 25* 23 23 19   CREATININE 1.74* 1.55* 1.30* 1.25* 1.10 1.22  CALCIUM 8.9 9.0  8.7* 9.0 8.3* 8.2*  MG 2.1  --  1.9 2.0 1.9 1.9  PHOS  --   --   --  1.8* 3.1  --    GFR: Estimated Creatinine Clearance: 69.7 mL/min (by C-G formula based on SCr of 1.22 mg/dL). Liver Function Tests: Recent Labs  Lab 07/21/18 0554 07/22/18 0959 07/23/18 0714 07/25/18 0456 07/26/18 0459  AST 26 22 21   --   --   ALT 25 25 22   --   --   ALKPHOS 128* 115 104  --   --   BILITOT 1.3* 2.0* 1.5*  --   --   PROT 6.9 7.2 6.6  --   --   ALBUMIN 3.4* 3.2* 2.8* 3.0* 2.7*   No results for input(s): LIPASE, AMYLASE in the last 168 hours. Recent Labs  Lab 07/20/18 1815  AMMONIA 33   Coagulation Profile: No results for input(s): INR, PROTIME in the last 168 hours. Cardiac Enzymes: No  results for input(s): CKTOTAL, CKMB, CKMBINDEX, TROPONINI in the last 168 hours. BNP (last 3 results) No results for input(s): PROBNP in the last 8760 hours. HbA1C: No results for input(s): HGBA1C in the last 72 hours. CBG: Recent Labs  Lab 07/21/18 1321  GLUCAP 129*   Lipid Profile: No results for input(s): CHOL, HDL, LDLCALC, TRIG, CHOLHDL, LDLDIRECT in the last 72 hours. Thyroid Function Tests: No results for input(s): TSH, T4TOTAL, FREET4, T3FREE, THYROIDAB in the last 72 hours. Anemia Panel: No results for input(s): VITAMINB12, FOLATE, FERRITIN, TIBC, IRON, RETICCTPCT in the last 72 hours. Sepsis Labs: No results for input(s): PROCALCITON, LATICACIDVEN in the last 168 hours.  Recent Results (from the past 240 hour(s))  Urine culture     Status: None   Collection Time: 07/20/18  9:28 AM  Result Value Ref Range Status   Specimen Description   Final    URINE, CATHETERIZED Performed at [facility], [address], [place], [place] [address]    Special Requests   Final    NONE Performed at [facility], [address], [place], [place] [address]    Culture   Final    NO GROWTH Performed at [facility], [address] [place], [place] [address]    Report Status 07/21/2018 FINAL   Final  MRSA PCR Screening     Status: Abnormal   Collection Time: 07/20/18  5:34 PM  Result Value Ref Range Status   MRSA by PCR POSITIVE (A) NEGATIVE Final    Comment:        The GeneXpert MRSA Assay (FDA approved for NASAL specimens only), is one component of a comprehensive MRSA colonization surveillance program. It is not intended to diagnose MRSA infection nor to guide or monitor treatment for MRSA infections. RESULT CALLED TO, READ BACK BY AND VERIFIED WITH: J.RHEW @ 2302 ON 1.17.20 BY LBB Performed at [facility], [address], [place], [place] [address]   Culture, blood (routine x 2)     Status: None   Collection Time: 07/21/18 12:12 PM  Result Value Ref Range Status   Specimen Description BLOOD LEFT WRIST  Final   Special Requests   Final    BOTTLES DRAWN AEROBIC AND ANAEROBIC Blood Culture adequate volume   Culture   Final    NO GROWTH 5 DAYS Performed at [facility], [address] [place], [place] [address]    Report Status 07/26/2018 FINAL  Final  Culture, blood (routine x 2)     Status: None   Collection Time: 07/21/18 12:12 PM  Result Value Ref Range Status   Specimen Description BLOOD RIGHT HAND  Final   Special Requests   Final    BOTTLES DRAWN AEROBIC AND ANAEROBIC Blood Culture adequate volume   Culture   Final    NO GROWTH 5 DAYS Performed at [facility], [address], [place], [place] [address]    Report Status 07/26/2018 FINAL  Final  C difficile quick scan w PCR reflex     Status: None   Collection Time: 07/25/18  9:44 AM  Result Value Ref Range Status   C Diff antigen NEGATIVE NEGATIVE Final   C Diff toxin NEGATIVE NEGATIVE Final   C Diff interpretation No C. difficile detected.  Final    Comment: Performed at [facility], [address] [place], [place] [address]    Radiology Studies: No results found. Scheduled Meds: . amLODipine  10 mg Oral Daily  . cloNIDine  0.2 mg Transdermal  Weekly  . dicyclomine  10 mg Oral BID  . enoxaparin  (LOVENOX) injection  30 mg Subcutaneous Q24H  . folic acid  1 mg Oral Daily  . guaiFENesin  1,200 mg Oral BID  . hydrALAZINE  25 mg Oral Q8H  . multivitamin with minerals  1 tablet Oral Daily  . mupirocin ointment  1 application Nasal BID  . ondansetron (ZOFRAN) IV  4 mg Intravenous TID WC & HS  . pantoprazole  40 mg Oral BID AC  . saccharomyces boulardii  250 mg Oral BID  . sertraline  100 mg Oral Daily  . tamsulosin  0.4 mg Oral Daily  . thiamine  100 mg Oral Daily   Or  . thiamine  100 mg Intravenous Daily   Continuous Infusions: . 0.9 % NaCl with KCl 20 mEq / L 50 mL/hr at 07/27/18 1034    LOS: 7 days   Time spent: 24 minutes  Standley Dakinslanford Shanessa Hodak, MD [facility]  07/27/2018, 3:12 PM

## 2018-07-27 NOTE — Care Management Important Message (Signed)
Important Message  Patient Details  Name: [name] MRN: [id number] Date of Birth: 07-15-1945   Medicare Important Message Given:  Yes    Malcolm Metro, RN 07/27/2018, 1:38 PM

## 2018-07-27 NOTE — Clinical Social Work Placement (Signed)
   CLINICAL SOCIAL WORK PLACEMENT  NOTE  Date:  07/27/2018  Patient Details  Name: [name] MRN: [id number] Date of Birth: 04-Jan-1946  Clinical Social Work is seeking post-discharge placement for this patient at the Skilled  Nursing Facility level of care (*CSW will initial, date and re-position this form in  chart as items are completed):  Yes   Patient/family provided with [place] Clinical Social Work Department's list of facilities offering this level of care within the geographic area requested by the patient (or if unable, by the patient's family).  Yes   Patient/family informed of their freedom to choose among providers that offer the needed level of care, that participate in Medicare, Medicaid or managed care program needed by the patient, have an available bed and are willing to accept the patient.  Yes   Patient/family informed of [place]'s ownership interest in [facility] and [facility], as well as of the fact that they are under no obligation to receive care at these facilities.  PASRR submitted to EDS on       PASRR number received on       Existing PASRR number confirmed on 07/24/18     FL2 transmitted to all facilities in geographic area requested by pt/family on 07/24/18     FL2 transmitted to all facilities within larger geographic area on       Patient informed that his/her managed care company has contracts with or will negotiate with certain facilities, including the following:        Yes   Patient/family informed of bed offers received.  Patient chooses bed at [facility]     Physician recommends and patient chooses bed at      Patient to be transferred to   on  .  Patient to be transferred to facility by       Patient family notified on   of transfer.  Name of family member notified:        PHYSICIAN       Additional Comment: Debbie at [employer] called and asked for [employer], including PT notes from  today for purposes of insurance authorization.  Clinicals sent.     _______________________________________________ Ida Rogue, LCSW 07/27/2018, 1:16 PM

## 2018-07-27 NOTE — Care Management Important Message (Signed)
Important Message  Patient Details  Name: [name] MRN: [id number] Date of Birth: 09-04-1945   Medicare Important Message Given:  Yes    Malcolm Metro, RN 07/27/2018, 8:23 AM

## 2018-07-27 NOTE — Progress Notes (Signed)
Physical Therapy Treatment Patient Details Name: [name] MRN: [id number] DOB: 09-Apr-1946 Today's Date: 07/27/2018    History of Present Illness [name] is a 73 y.o. male with medical history significant of  Chronic pain syndrome, HTN, multiple falls, peripheral neuropathy, who was admitted in 05/2018 for frequent falls which was felt at that time to be related to alcohol abuse.  He suffered a C6 fracture at that time and was discharged with a cervical collar.  He has since followed up with neurosurgery and cervical collar has been discontinued.  After that last admission, he went to a skilled nursing facility for physical therapy and wife reports that he was doing well upon return home.  He is followed at a pain clinic and recently his opiates were discontinued.  Patient is still drowsy at this time and is unable to provide history.  His wife reports that he continue to take reportedly 1 tablet of MS Contin daily from his remaining prescription.  She has noted over the past week, he has been increasingly lethargic, weak.  He has had decreased p.o. intake.  He has been confused.  He is not had any fever, diarrhea or vomiting.  No complaints of chest pain or shortness of breath.  He is also been falling again lately, but is pretty much been in bed for the last week.  She feels that he sleeps 20 out of 24 hours.    PT Comments    Patient requires less assistance for supine to sitting, requires assistance to move legs when getting back into bed, increased endurance/distance for taking side steps at bedside, limited mostly due to weakness and fatigue and demonstrated good return for completing BLE ROM/strengthening exercises while seated at bedside.  Patient put back to bed due to flexiseal.  Patient will benefit from continued physical therapy in hospital and recommended venue below to increase strength, balance, endurance for safe ADLs and gait.   Follow Up Recommendations  SNF      Equipment Recommendations  None recommended by PT    Recommendations for Other Services       Precautions / Restrictions Precautions Precautions: Fall Restrictions Weight Bearing Restrictions: No    Mobility  Bed Mobility Overal bed mobility: Needs Assistance Bed Mobility: Supine to Sit;Sit to Supine     Supine to sit: Min guard;Min assist Sit to supine: Min guard;Min assist   General bed mobility comments: increased time, slow labored movement  Transfers Overall transfer level: Needs assistance   Transfers: Sit to/from Stand Sit to Stand: Min assist         General transfer comment: labored movement, verbal/tactile cueing for proper hand placement  Ambulation/Gait Ambulation/Gait assistance: Mod assist Gait Distance (Feet): 8 Feet Assistive device: Rolling walker (2 wheeled) Gait Pattern/deviations: Decreased step length - right;Decreased step length - left;Decreased stride length Gait velocity: slow   General Gait Details: limited to 8-10 slow labored side steps at bedside x 2 trials with TLSO on, requiring sitting rest break for approximately 3-4 minutes between bouts, no loss of balance   Stairs             Wheelchair Mobility    Modified Rankin (Stroke Patients Only)       Balance Overall balance assessment: Needs assistance Sitting-balance support: Feet supported;No upper extremity supported Sitting balance-Leahy Scale: Good     Standing balance support: Bilateral upper extremity supported;During functional activity Standing balance-Leahy Scale: Fair Standing balance comment: with RW  Cognition Arousal/Alertness: Awake/alert Behavior During Therapy: WFL for tasks assessed/performed Overall Cognitive Status: Within Functional Limits for tasks assessed                                        Exercises General Exercises - Lower Extremity Long Arc Quad:  Seated;AROM;Strengthening;Both;10 reps Hip Flexion/Marching: Seated;Strengthening;AROM;Both;10 reps Toe Raises: Seated;Strengthening;AROM;Both;10 reps Heel Raises: Seated;AROM;Strengthening;Both;10 reps    General Comments        Pertinent Vitals/Pain Pain Assessment: 0-10 Pain Score: 7  Pain Location: low back Pain Descriptors / Indicators: Aching;Sore Pain Intervention(s): Limited activity within patient's tolerance;Monitored during session    Home Living                      Prior Function            PT Goals (current goals can now be found in the care plan section) Acute Rehab PT Goals Patient Stated Goal: return home after rehab PT Goal Formulation: With patient/family Time For Goal Achievement: 08/07/18 Potential to Achieve Goals: Good Progress towards PT goals: Progressing toward goals    Frequency    Min 3X/week      PT Plan Current plan remains appropriate    Co-evaluation              AM-PAC PT "6 Clicks" Mobility   Outcome Measure  Help needed turning from your back to your side while in a flat bed without using bedrails?: A Little Help needed moving from lying on your back to sitting on the side of a flat bed without using bedrails?: A Little Help needed moving to and from a bed to a chair (including a wheelchair)?: A Little Help needed standing up from a chair using your arms (e.g., wheelchair or bedside chair)?: A Lot Help needed to walk in hospital room?: A Lot Help needed climbing 3-5 steps with a railing? : A Lot 6 Click Score: 15    End of Session Equipment Utilized During Treatment: Other (comment)(TLSO brace) Activity Tolerance: Patient tolerated treatment well Patient left: in bed;with bed alarm set;with family/visitor present Nurse Communication: Mobility status PT Visit Diagnosis: Unsteadiness on feet (R26.81);Other abnormalities of gait and mobility (R26.89);Muscle weakness (generalized) (M62.81)     Time:  2641-5830 PT Time Calculation (min) (ACUTE ONLY): 29 min  Charges:  $Therapeutic Exercise: 8-22 mins $Therapeutic Activity: 8-22 mins                     12:08 PM, 07/27/18 Ocie Bob, MPT Physical Therapist with [facility] [phone] office [phone] mobile phone

## 2018-07-28 LAB — BASIC METABOLIC PANEL
Anion gap: 6 (ref 5–15)
BUN: 14 mg/dL (ref 8–23)
CO2: 21 mmol/L — ABNORMAL LOW (ref 22–32)
Calcium: 8.1 mg/dL — ABNORMAL LOW (ref 8.9–10.3)
Chloride: 111 mmol/L (ref 98–111)
Creatinine, Ser: 1.19 mg/dL (ref 0.61–1.24)
GFR calc Af Amer: >60 mL/min (ref >60)
GFR calc non Af Amer: >60 mL/min (ref >60)
Glucose, Bld: 117 mg/dL — ABNORMAL HIGH (ref 70–99)
Potassium: 3.7 mmol/L (ref 3.5–5.1)
Sodium: 138 mmol/L (ref 135–145)

## 2018-07-28 LAB — MAGNESIUM: Magnesium: 2 mg/dL (ref 1.7–2.4)

## 2018-07-28 MED ORDER — TRAZODONE HCL 50 MG PO TABS
50.0000 mg | ORAL_TABLET | Freq: Once | ORAL | Status: AC
  Administered 2018-07-28: 50 mg via ORAL
  Filled 2018-07-28: qty 1

## 2018-07-28 NOTE — Progress Notes (Signed)
Patient states abdominal pain diarrhea is steadily improving.  He is tolerating basically regular diet Perineal bleeding reported to be due to a scrotal laceration.  Of note, patient tells me his last colonoscopy about 10 years ago was done in [place] and it was reported to be negative.   Vital signs in last 24 hours: Temp:  [98 F (36.7 C)-98.6 F (37 C)] 98.6 F (37 C) (01/25 0536) Pulse Rate:  [58-64] 59 (01/25 0536) Resp:  [17-20] 17 (01/25 0536) BP: (132-150)/(61-73) 132/62 (01/25 0536) SpO2:  [96 %-98 %] 96 % (01/25 0536) Last BM Date: 07/26/18 General:   Alert,  pleasant and cooperative in NAD; accompanied by spouse. Abdomen: Nondistended.  Positive bowel sounds minimal diffuse tenderness without appreciable mass organomegaly extremities:  Without clubbing or edema.    Intake/Output from previous day: 01/24 0701 - 01/25 0700 In: 1445 [P.O.:600; I.V.:845] Out: 400 [Stool:400] Intake/Output this shift: Total I/O In: 240 [P.O.:240] Out: -   Lab Results: Recent Labs    07/26/18 0459 07/27/18 0450  WBC 12.1* 10.5  HGB 12.4* 11.1*  HCT 37.1* 34.3*  PLT 201 199   BMET Recent Labs    07/26/18 0459 07/27/18 0450 07/28/18 0634  NA 136 137 138  K 3.7 3.7 3.7  CL 109 109 111  CO2 22 22 21*  GLUCOSE 96 102* 117*  BUN 23 19 14   CREATININE 1.10 1.22 1.19  CALCIUM 8.3* 8.2* 8.1*   LFT Recent Labs    07/26/18 0459  ALBUMIN 2.7*   PT/INR No results for input(s): LABPROT, INR in the last 72 hours. Hepatitis Panel No results for input(s): HEPBSAG, HCVAB, HEPAIGM, HEPBIGM in the last 72 hours. C-Diff No results for input(s): CDIFFTOX in the last 72 hours.  Studies/Results: No results found.   Impression:    Abdominal pain and diarrhea-likely multifactorial in etiology steadily improving. Duodenal abnormality of uncertain significance.    Recommendations: Continue supportive measures.  Continue Bentyl as needed.  Further outpatient evaluation (EGD,  etc.) per plan

## 2018-07-28 NOTE — Progress Notes (Signed)
PROGRESS NOTE  [name]  NMM:[id number] DOB: 1945/10/12 DOA: 07/20/2018 PCP: Vivien Presto, MD   Brief Narrative:  73 year old male with a history of chronic pain syndrome, hypertension, multiple falls, brought to the hospital with worsening mental status, decreased p.o. intake and repeated falls.  He was found to have dehydration and acute renal failure.  It was felt the change in mental status likely related to medications in the setting of renal failure.  Hospital course has been complicated by development of fever and chest x-ray indicating developing pneumonia.  He has been started on intravenous antibiotics.  Assessment & Plan:   Active Problems:   Chronic pain syndrome   Essential hypertension   Ankylosing spondylitis (HCC)   Acute encephalopathy   Acute kidney injury (HCC)   Dehydration   Opiate overdose (HCC)   Aspiration pneumonia of both lower lobes due to gastric secretions (HCC)   Acute respiratory failure with hypoxia (HCC)   Compression fracture of T10 vertebra (HCC)   Duodenitis   Nausea and vomiting   Opiate use   Diarrhea  1. Acute encephalopathy. RESOLVED.  Related to opiate use as well as other sedative medications in the setting of renal failure.  He initially did respond to Narcan in the emergency room.  Overall mental status has substantially improved and he is now able to carry on a conversation.  Continue to follow.  Holding all sedative medications.  CT imaging of the head and neck on admission was unrevealing.  He is slowly improving.  PT recommending SNF placement.  2. Acute kidney injury.  IMPROVING with gently hydration.  Likely related to dehydration, ARB use and Lasix use.  Nephrotoxic agents currently on hold.  He has been treated with gentle hydration.  Renal function is slowly improving. 3. Hypokalemia - REPLETED.  IV replacement given, magnesium stable.  4. Unintentional opiate overdose.  The patient takes MS Contin amongst other  sedatives.  Overall mental status is slowly improving.  Continue to monitor.  Opioids have not been restarted.  He has appointment to establish care with [facility] 08/08/18 at 11 am.  5. Acute respiratory failure.  RESOLVED.  Related to pneumonia.  He is on supplemental oxygen.  We will try and wean off as tolerated. 6. Duondenal wall abnormality - Appreciate GI assistance, will need outpatient EGD in 3-4 weeks.   7. Diarrhea - slowly improving, c diff negative, added lomotil and added probiotics - florastor BID.  DC antibiotics 1/23. Appreciate GI team assistance.  Temporary rectal tube inserted.  He can discharge to SNF when diarrhea controlled.  8. Aspiration Pneumonia, likely aspiration related. TREATED.  Patient was noted to have fever and had bilateral rhonchi.  Chest x-ray indicated pneumonia.   Influenza panel is negative.  Blood cultures have shown no growth.   9. Abdominal pain - RESOLVED.  nausea CT suggesting duodenitis - IV protonix ordered.  10. T10 compression fracture - TLSO brace ordered. Pt uses it with ambulation.  He is tolerating it so far. He is going to SNF for rehab.   11. Hypertension.  Temporarily holding losartan/Lasix.   Blood pressures better controlled after adding hydralazine.   12. Chronic pain syndrome.  Pt requesting no opioids at this time.  Establish care with Dr. Ronal Fear office for chronic pain management as scheduled on 08/08/18 at 11 am.   13. Generalized weakness.  Physical therapy recommending SNF placement.  14. Urinary retention.  Has briefly required in and out  catheterization x2.  Possibly related to immobility/anticholinergic effects of medications.  He is also been restarted on Flomax.  He seems to be urinating without difficulty now.  Continue to monitor.    DVT prophylaxis: Lovenox Code Status: Full code Family Communication: Discussed with wife (telephone) Disposition Plan: SNF placement when diarrhea  resolves  Consultants:   GI   Procedures:     Antimicrobials:   Zosyn 1/18 >1/22  augmentin 1/22-1/23  Subjective: Patient reports that he is having less diarrhea but it persists.   Objective: Vitals:   07/27/18 2057 07/27/18 2216 07/28/18 0116 07/28/18 0536  BP:  (!) 150/73 (!) 141/61 132/62  Pulse:  (!) 58 (!) 58 (!) 59  Resp:  20 17 17   Temp:  98 F (36.7 C)  98.6 F (37 C)  TempSrc:  Oral  Oral  SpO2: 97% 96% 96% 96%  Weight:        Intake/Output Summary (Last 24 hours) at 07/28/2018 1301 Last data filed at 07/28/2018 0929 Gross per 24 hour  Intake 1444.98 ml  Output 400 ml  Net 1044.98 ml   Filed Weights   07/20/18 0914  Weight: 102.3 kg   Examination:  General exam: Alert, awake, oriented x 3 Respiratory system: coarse rhonchi. Respiratory effort normal. Cardiovascular system: normal s1,s2 sounds.  No murmurs, rubs, gallops. Gastrointestinal system: Abdomen is nondistended, soft and nontender. No organomegaly or masses felt. Normal bowel sounds heard. GU: scrotal skin tear wound clean and dry.  Central nervous system: Alert and oriented. No focal neurological deficits. Extremities: No C/C/E, +pedal pulses Skin: No rashes, lesions or ulcers Psychiatry: Judgement and insight appear normal. Mood & affect appropriate.   Data Reviewed: I have personally reviewed following labs and imaging studies  CBC: Recent Labs  Lab 07/23/18 0714 07/24/18 0725 07/25/18 0456 07/26/18 0459 07/27/18 0450  WBC 16.5* 17.5* 16.7* 12.1* 10.5  NEUTROABS  --   --  14.9* 9.9* 8.1*  HGB 11.3* 11.7* 13.2 12.4* 11.1*  HCT 35.4* 36.5* 41.1 37.1* 34.3*  MCV 90.8 89.5 89.2 88.3 88.2  PLT 210 195 223 201 199   Basic Metabolic Panel: Recent Labs  Lab 07/24/18 0725 07/25/18 0456 07/26/18 0459 07/27/18 0450 07/28/18 0634  NA 142 142 136 137 138  K 3.1* 3.1* 3.7 3.7 3.7  CL 108 105 109 109 111  CO2 24 25 22 22  21*  GLUCOSE 111* 110* 96 102* 117*  BUN 25* 23 23 19 14    CREATININE 1.30* 1.25* 1.10 1.22 1.19  CALCIUM 8.7* 9.0 8.3* 8.2* 8.1*  MG 1.9 2.0 1.9 1.9 2.0  PHOS  --  1.8* 3.1  --   --    GFR: Estimated Creatinine Clearance: 71.5 mL/min (by C-G formula based on SCr of 1.19 mg/dL). Liver Function Tests: Recent Labs  Lab 07/22/18 0959 07/23/18 0714 07/25/18 0456 07/26/18 0459  AST 22 21  --   --   ALT 25 22  --   --   ALKPHOS 115 104  --   --   BILITOT 2.0* 1.5*  --   --   PROT 7.2 6.6  --   --   ALBUMIN 3.2* 2.8* 3.0* 2.7*   No results for input(s): LIPASE, AMYLASE in the last 168 hours. No results for input(s): AMMONIA in the last 168 hours. Coagulation Profile: No results for input(s): INR, PROTIME in the last 168 hours. Cardiac Enzymes: No results for input(s): CKTOTAL, CKMB, CKMBINDEX, TROPONINI in the last 168 hours. BNP (last 3 results)  No results for input(s): PROBNP in the last 8760 hours. HbA1C: No results for input(s): HGBA1C in the last 72 hours. CBG: Recent Labs  Lab 07/21/18 1321  GLUCAP 129*   Lipid Profile: No results for input(s): CHOL, HDL, LDLCALC, TRIG, CHOLHDL, LDLDIRECT in the last 72 hours. Thyroid Function Tests: No results for input(s): TSH, T4TOTAL, FREET4, T3FREE, THYROIDAB in the last 72 hours. Anemia Panel: No results for input(s): VITAMINB12, FOLATE, FERRITIN, TIBC, IRON, RETICCTPCT in the last 72 hours. Sepsis Labs: No results for input(s): PROCALCITON, LATICACIDVEN in the last 168 hours.  Recent Results (from the past 240 hour(s))  Urine culture     Status: None   Collection Time: 07/20/18  9:28 AM  Result Value Ref Range Status   Specimen Description   Final    URINE, CATHETERIZED Performed at [facility], [address] [place], [place] [address]    Special Requests   Final    NONE Performed at [facility], [address] [place], [place] [address]    Culture   Final    NO GROWTH Performed at [facility], [address] [place], [place] [address]    Report Status  07/21/2018 FINAL  Final  MRSA PCR Screening     Status: Abnormal   Collection Time: 07/20/18  5:34 PM  Result Value Ref Range Status   MRSA by PCR POSITIVE (A) NEGATIVE Final    Comment:        The GeneXpert MRSA Assay (FDA approved for NASAL specimens only), is one component of a comprehensive MRSA colonization surveillance program. It is not intended to diagnose MRSA infection nor to guide or monitor treatment for MRSA infections. RESULT CALLED TO, READ BACK BY AND VERIFIED WITH: J.RHEW @ 2302 ON 1.17.20 BY LBB Performed at [facility], [address], [place], [place] [address]   Culture, blood (routine x 2)     Status: None   Collection Time: 07/21/18 12:12 PM  Result Value Ref Range Status   Specimen Description BLOOD LEFT WRIST  Final   Special Requests   Final    BOTTLES DRAWN AEROBIC AND ANAEROBIC Blood Culture adequate volume   Culture   Final    NO GROWTH 5 DAYS Performed at [facility], [address] [place], [place] [address]    Report Status 07/26/2018 FINAL  Final  Culture, blood (routine x 2)     Status: None   Collection Time: 07/21/18 12:12 PM  Result Value Ref Range Status   Specimen Description BLOOD RIGHT HAND  Final   Special Requests   Final    BOTTLES DRAWN AEROBIC AND ANAEROBIC Blood Culture adequate volume   Culture   Final    NO GROWTH 5 DAYS Performed at [facility], [address], [place], [place] [address]    Report Status 07/26/2018 FINAL  Final  C difficile quick scan w PCR reflex     Status: None   Collection Time: 07/25/18  9:44 AM  Result Value Ref Range Status   C Diff antigen NEGATIVE NEGATIVE Final   C Diff toxin NEGATIVE NEGATIVE Final   C Diff interpretation No C. difficile detected.  Final    Comment: Performed at [facility], [address], [place], [place] [address]    Radiology Studies: No results found. Scheduled Meds: . amLODipine  10 mg Oral Daily  . cloNIDine  0.2 mg Transdermal Weekly  . dicyclomine  10 mg Oral  BID  . enoxaparin (LOVENOX) injection  30 mg Subcutaneous Q24H  . folic acid  1 mg Oral Daily  . guaiFENesin  1,200 mg Oral BID  . hydrALAZINE  50 mg Oral Q8H  . multivitamin with minerals  1 tablet Oral Daily  . ondansetron (ZOFRAN) IV  4 mg Intravenous TID WC & HS  . pantoprazole  40 mg Oral BID AC  . saccharomyces boulardii  250 mg Oral BID  . sertraline  100 mg Oral Daily  . tamsulosin  0.4 mg Oral Daily  . thiamine  100 mg Oral Daily   Or  . thiamine  100 mg Intravenous Daily   Continuous Infusions: . 0.9 % NaCl with KCl 20 mEq / L 50 mL/hr at 07/28/18 0400    LOS: 8 days   Time spent: 28 minutes  Standley Dakinslanford Johnson, MD [facility]  07/28/2018, 1:01 PM

## 2018-07-28 NOTE — Progress Notes (Addendum)
Found pt to have flexi-seal removed, fully inflated. Pt states "I didn't even know it came out." incontinence pad found to have large amount of blood and stool on it. Source of large amount of bleeding found to be a small laceration to testicle. Pressure dressing applied and elevated with towel. Will continue to monitor. Accidental removal of flexi-seal reported to Dr. Antionette Char; ok to keep flexi-seal out at this time.

## 2018-07-29 DIAGNOSIS — T40601S Poisoning by unspecified narcotics, accidental (unintentional), sequela: Secondary | ICD-10-CM

## 2018-07-29 DIAGNOSIS — R197 Diarrhea, unspecified: Secondary | ICD-10-CM

## 2018-07-29 DIAGNOSIS — S22070D Wedge compression fracture of T9-T10 vertebra, subsequent encounter for fracture with routine healing: Secondary | ICD-10-CM

## 2018-07-29 MED ORDER — DIPHENOXYLATE-ATROPINE 2.5-0.025 MG PO TABS
2.0000 | ORAL_TABLET | Freq: Two times a day (BID) | ORAL | Status: DC
  Administered 2018-07-29 – 2018-07-30 (×3): 2 via ORAL
  Filled 2018-07-29 (×3): qty 2

## 2018-07-29 NOTE — Progress Notes (Signed)
PROGRESS NOTE  CHRIS GARCIA  GMW:[id number] DOB: [birth year] DOA: 07/20/2018 PCP: Vivien Presto, MD   Brief Narrative:  73 year old male with a history of chronic pain syndrome, hypertension, multiple falls, brought to the hospital with worsening mental status, decreased p.o. intake and repeated falls.  He was found to have dehydration and acute renal failure.  It was felt the change in mental status likely related to medications in the setting of renal failure.  Hospital course has been complicated by development of fever and chest x-ray indicating developing pneumonia.  He has been started on intravenous antibiotics.  Assessment & Plan:   Active Problems:   Chronic pain syndrome   Essential hypertension   Ankylosing spondylitis (HCC)   Acute encephalopathy   Acute kidney injury (HCC)   Dehydration   Opiate overdose (HCC)   Aspiration pneumonia of both lower lobes due to gastric secretions (HCC)   Acute respiratory failure with hypoxia (HCC)   Compression fracture of T10 vertebra (HCC)   Duodenitis   Nausea and vomiting   Opiate use   Diarrhea  1. Acute encephalopathy. RESOLVED.  Related to opiate use as well as other sedative medications in the setting of renal failure.  He initially did respond to Narcan in the emergency room.  Overall mental status has substantially improved and he is now able to carry on a conversation.  Continue to follow.  Holding all sedative medications.  CT imaging of the head and neck on admission was unrevealing.  He is slowly improving.  PT recommending SNF placement.  2. Acute kidney injury.  IMPROVING with gently hydration.  Likely related to dehydration, ARB use and Lasix use.  Nephrotoxic agents currently on hold.  He has been treated with gentle hydration.  Renal function is slowly improving. 3. Hypokalemia - REPLETED.  IV replacement given, magnesium stable.  4. Unintentional opiate overdose.  The patient takes MS Contin amongst other  sedatives.  Overall mental status is slowly improving.  Continue to monitor.  Opioids have not been restarted.  He has appointment to establish care with [facility] 08/08/18 at 11 am.  5. Acute respiratory failure.  RESOLVED.  Related to pneumonia.  He is on supplemental oxygen.  We will try and wean off as tolerated. 6. Duondenal wall abnormality - Appreciate GI assistance, will need outpatient EGD in 3-4 weeks.   7. Diarrhea - slowly improving, c diff negative, added lomotil and added probiotics - florastor BID.  DC antibiotics 1/23. Appreciate GI team assistance.  He temporarily had rectal tube, but this is been discontinued.  He can discharge to SNF when diarrhea controlled.  We will plan on scheduling Lomotil twice daily for now. 8. Aspiration Pneumonia, likely aspiration related. TREATED.  Patient was noted to have fever and had bilateral rhonchi.  Chest x-ray indicated pneumonia.   Influenza panel is negative.  Blood cultures have shown no growth.   9. Abdominal pain - RESOLVED.  nausea CT suggesting duodenitis - IV protonix ordered.  10. T10 compression fracture - TLSO brace ordered. Pt uses it with ambulation.  He is tolerating it so far. He is going to SNF for rehab.   11. Hypertension.  Temporarily holding losartan/Lasix.   Blood pressures better controlled after adding hydralazine.   12. Chronic pain syndrome.  Pt requesting no opioids at this time.  Establish care with Dr. Ronal Fear office for chronic pain management as scheduled on 08/08/18 at 11 am.   13. Generalized weakness.  Physical therapy recommending SNF placement.  14. Urinary retention.  Has briefly required in and out catheterization x2.  Possibly related to immobility/anticholinergic effects of medications.  He is also been restarted on Flomax.  He seems to be urinating without difficulty now.  Continue to monitor.    DVT prophylaxis: Lovenox Code Status: Full code Family Communication:  Discussed with wife  Disposition Plan: SNF placement when diarrhea resolves  Consultants:   GI   Procedures:     Antimicrobials:   Zosyn 1/18 >1/22  augmentin 1/22-1/23  Subjective: Patient had 3-4 bowel movements overnight.  He is frustrated regarding the persistence of his diarrhea.  P.o. intake has been poor.  Objective: Vitals:   07/28/18 1412 07/28/18 2048 07/28/18 2058 07/29/18 0506  BP: (!) 149/68 (!) 143/68  (!) 129/56  Pulse: 63 (!) 56  (!) 59  Resp: 20 20  18   Temp: 98.3 F (36.8 C) 99.8 F (37.7 C)  98 F (36.7 C)  TempSrc: Oral Oral  Oral  SpO2: 95% 98% 98% 95%  Weight:        Intake/Output Summary (Last 24 hours) at 07/29/2018 1932 Last data filed at 07/29/2018 1725 Gross per 24 hour  Intake 1117.96 ml  Output -  Net 1117.96 ml   Filed Weights   07/20/18 0914  Weight: 102.3 kg   Examination:  General exam: Alert, awake, oriented x 3 Respiratory system: Clear to auscultation. Respiratory effort normal. Cardiovascular system:RRR. No murmurs, rubs, gallops. Gastrointestinal system: Abdomen is nondistended, soft and nontender. No organomegaly or masses felt. Normal bowel sounds heard. Central nervous system: Alert and oriented. No focal neurological deficits. Extremities: No C/C/E, +pedal pulses Skin: No rashes, lesions or ulcers Psychiatry: Judgement and insight appear normal. Mood & affect appropriate.    Data Reviewed: I have personally reviewed following labs and imaging studies  CBC: Recent Labs  Lab 07/23/18 0714 07/24/18 0725 07/25/18 0456 07/26/18 0459 07/27/18 0450  WBC 16.5* 17.5* 16.7* 12.1* 10.5  NEUTROABS  --   --  14.9* 9.9* 8.1*  HGB 11.3* 11.7* 13.2 12.4* 11.1*  HCT 35.4* 36.5* 41.1 37.1* 34.3*  MCV 90.8 89.5 89.2 88.3 88.2  PLT 210 195 223 201 199   Basic Metabolic Panel: Recent Labs  Lab 07/24/18 0725 07/25/18 0456 07/26/18 0459 07/27/18 0450 07/28/18 0634  NA 142 142 136 137 138  K 3.1* 3.1* 3.7 3.7 3.7  CL  108 105 109 109 111  CO2 24 25 22 22  21*  GLUCOSE 111* 110* 96 102* 117*  BUN 25* 23 23 19 14   CREATININE 1.30* 1.25* 1.10 1.22 1.19  CALCIUM 8.7* 9.0 8.3* 8.2* 8.1*  MG 1.9 2.0 1.9 1.9 2.0  PHOS  --  1.8* 3.1  --   --    GFR: Estimated Creatinine Clearance: 71.5 mL/min (by C-G formula based on SCr of 1.19 mg/dL). Liver Function Tests: Recent Labs  Lab 07/23/18 0714 07/25/18 0456 07/26/18 0459  AST 21  --   --   ALT 22  --   --   ALKPHOS 104  --   --   BILITOT 1.5*  --   --   PROT 6.6  --   --   ALBUMIN 2.8* 3.0* 2.7*   No results for input(s): LIPASE, AMYLASE in the last 168 hours. No results for input(s): AMMONIA in the last 168 hours. Coagulation Profile: No results for input(s): INR, PROTIME in the last 168 hours. Cardiac Enzymes: No results for input(s): CKTOTAL, CKMB, CKMBINDEX, TROPONINI  in the last 168 hours. BNP (last 3 results) No results for input(s): PROBNP in the last 8760 hours. HbA1C: No results for input(s): HGBA1C in the last 72 hours. CBG: No results for input(s): GLUCAP in the last 168 hours. Lipid Profile: No results for input(s): CHOL, HDL, LDLCALC, TRIG, CHOLHDL, LDLDIRECT in the last 72 hours. Thyroid Function Tests: No results for input(s): TSH, T4TOTAL, FREET4, T3FREE, THYROIDAB in the last 72 hours. Anemia Panel: No results for input(s): VITAMINB12, FOLATE, FERRITIN, TIBC, IRON, RETICCTPCT in the last 72 hours. Sepsis Labs: No results for input(s): PROCALCITON, LATICACIDVEN in the last 168 hours.  Recent Results (from the past 240 hour(s))  Urine culture     Status: None   Collection Time: 07/20/18  9:28 AM  Result Value Ref Range Status   Specimen Description   Final    URINE, CATHETERIZED Performed at [facility], [address] [place], [place] [address]    Special Requests   Final    NONE Performed at [facility], [address], [place], [place] [address]    Culture   Final    NO GROWTH Performed at [facility],  [address], [place], [place] [address]    Report Status 07/21/2018 FINAL  Final  MRSA PCR Screening     Status: Abnormal   Collection Time: 07/20/18  5:34 PM  Result Value Ref Range Status   MRSA by PCR POSITIVE (A) NEGATIVE Final    Comment:        The GeneXpert MRSA Assay (FDA approved for NASAL specimens only), is one component of a comprehensive MRSA colonization surveillance program. It is not intended to diagnose MRSA infection nor to guide or monitor treatment for MRSA infections. RESULT CALLED TO, READ BACK BY AND VERIFIED WITH: J.RHEW @ 2302 ON 1.17.20 BY LBB Performed at [facility], [address], [place], [place] [address]   Culture, blood (routine x 2)     Status: None   Collection Time: 07/21/18 12:12 PM  Result Value Ref Range Status   Specimen Description BLOOD LEFT WRIST  Final   Special Requests   Final    BOTTLES DRAWN AEROBIC AND ANAEROBIC Blood Culture adequate volume   Culture   Final    NO GROWTH 5 DAYS Performed at [facility], [address] [place], [place] [address]    Report Status 07/26/2018 FINAL  Final  Culture, blood (routine x 2)     Status: None   Collection Time: 07/21/18 12:12 PM  Result Value Ref Range Status   Specimen Description BLOOD RIGHT HAND  Final   Special Requests   Final    BOTTLES DRAWN AEROBIC AND ANAEROBIC Blood Culture adequate volume   Culture   Final    NO GROWTH 5 DAYS Performed at [facility], [address], [place], [place] [address]    Report Status 07/26/2018 FINAL  Final  C difficile quick scan w PCR reflex     Status: None   Collection Time: 07/25/18  9:44 AM  Result Value Ref Range Status   C Diff antigen NEGATIVE NEGATIVE Final   C Diff toxin NEGATIVE NEGATIVE Final   C Diff interpretation No C. difficile detected.  Final    Comment: Performed at [facility], [address] [place], [place] [address]    Radiology Studies: No results found. Scheduled Meds: . amLODipine  10 mg Oral Daily  .  cloNIDine  0.2 mg Transdermal Weekly  . dicyclomine  10 mg  Oral BID  . diphenoxylate-atropine  2 tablet Oral BID  . enoxaparin (LOVENOX) injection  30 mg Subcutaneous Q24H  . folic acid  1 mg Oral Daily  . guaiFENesin  1,200 mg Oral BID  . hydrALAZINE  50 mg Oral Q8H  . multivitamin with minerals  1 tablet Oral Daily  . ondansetron (ZOFRAN) IV  4 mg Intravenous TID WC & HS  . pantoprazole  40 mg Oral BID AC  . saccharomyces boulardii  250 mg Oral BID  . sertraline  100 mg Oral Daily  . tamsulosin  0.4 mg Oral Daily  . thiamine  100 mg Oral Daily   Or  . thiamine  100 mg Intravenous Daily   Continuous Infusions: . 0.9 % NaCl with KCl 20 mEq / L 10 mL/hr at 07/28/18 1357    LOS: 9 days   Time spent: 30minutes  Erick BlinksJehanzeb Orson Rho, MD [facility]  07/29/2018, 7:32 PM

## 2018-07-30 ENCOUNTER — Telehealth: Payer: Self-pay | Admitting: Gastroenterology

## 2018-07-30 MED ORDER — DIPHENOXYLATE-ATROPINE 2.5-0.025 MG PO TABS
2.0000 | ORAL_TABLET | Freq: Two times a day (BID) | ORAL | Status: DC | PRN
  Administered 2018-07-31: 2 via ORAL
  Filled 2018-07-30: qty 2

## 2018-07-30 NOTE — Progress Notes (Signed)
PROGRESS NOTE  [name]  ZOX:[id number] DOB: 1946-01-24 DOA: 07/20/2018 PCP: Vivien Prestoorrington, Kip A, MD   Brief Narrative:  73 year old male with a history of chronic pain syndrome, hypertension, multiple falls, brought to the hospital with worsening mental status, decreased p.o. intake and repeated falls.  He was found to have dehydration and acute renal failure.  It was felt the change in mental status likely related to medications in the setting of renal failure.  Hospital course has been complicated by development of fever and chest x-ray indicating developing pneumonia.  He has been started on intravenous antibiotics.  Assessment & Plan:   Active Problems:   Chronic pain syndrome   Essential hypertension   Ankylosing spondylitis (HCC)   Acute encephalopathy   Acute kidney injury (HCC)   Dehydration   Opiate overdose (HCC)   Aspiration pneumonia of both lower lobes due to gastric secretions (HCC)   Acute respiratory failure with hypoxia (HCC)   Compression fracture of T10 vertebra (HCC)   Duodenitis   Nausea and vomiting   Opiate use   Diarrhea  1. Acute encephalopathy. RESOLVED.  Related to opiate use as well as other sedative medications in the setting of renal failure.  He initially did respond to Narcan in the emergency room.  Overall mental status has substantially improved and he is now able to carry on a conversation.  Continue to follow.  Holding all sedative medications.  CT imaging of the head and neck on admission was unrevealing.  He is slowly improving.  PT recommending SNF placement.  2. Acute kidney injury.  IMPROVING with gently hydration.  Likely related to dehydration, ARB use and Lasix use.  Nephrotoxic agents currently on hold.  He has been treated with gentle hydration.  Renal function is slowly improving. 3. Hypokalemia - REPLETED.  IV replacement given, magnesium stable.  4. Unintentional opiate overdose.  The patient takes MS Contin amongst other  sedatives.  Overall mental status is slowly improving.  Continue to monitor.  Opioids have not been restarted.  He has appointment to establish care with [facility] K[facility]i [place] pain management 08/08/18 at 11 am.  5. Acute respiratory failure.  RESOLVED.  Related to pneumonia.  He is on supplemental oxygen.  We will try and wean off as tolerated. 6. Duondenal wall abnormality - Appreciate GI assistance, will need outpatient EGD in 3-4 weeks.   7. Diarrhea - slowly improving, c diff negative, added lomotil and added probiotics - florastor BID.  DC antibiotics 1/23. Appreciate GI team assistance.  He temporarily had rectal tube, but this is been discontinued.  Diarrhea is now resolved after receiving Lomotil. 8. Aspiration Pneumonia, likely aspiration related. TREATED.  Patient was noted to have fever and had bilateral rhonchi.  Chest x-ray indicated pneumonia.   Influenza panel is negative.  Blood cultures have shown no growth.   9. Abdominal pain - RESOLVED.  nausea CT suggesting duodenitis - IV protonix ordered.  10. T10 compression fracture - TLSO brace ordered. Pt uses it with ambulation.  He is tolerating it so far. He is going to SNF for rehab.   11. Hypertension.  Temporarily holding losartan/Lasix.   Blood pressures better controlled after adding hydralazine.   12. Chronic pain syndrome.  Pt requesting no opioids at this time.  Establish care with Dr. Ronal Fearoonquah's office for chronic pain management as scheduled on 08/08/18 at 11 am.   13. Generalized weakness.  Physical therapy recommending SNF placement.  14. Urinary retention.  Has briefly  required in and out catheterization x2.  Possibly related to immobility/anticholinergic effects of medications.  He is also been restarted on Flomax.  He seems to be urinating without difficulty now.  Continue to monitor.    DVT prophylaxis: Lovenox Code Status: Full code Family Communication: Discussed with wife  Disposition Plan: Stable for discharge  to skilled nursing facility once insurance authorization is available  Consultants:   GI   Procedures:     Antimicrobials:   Zosyn 1/18 >1/22  augmentin 1/22-1/23  Subjective: Feeling better.  Diarrhea has resolved.  Last bowel movement was yesterday.  Objective: Vitals:   07/30/18 0929 07/30/18 1014 07/30/18 1405 07/30/18 1455  BP:  (!) 115/55 (!) 111/48 (!) 111/48  Pulse:  84 64   Resp:  18 18   Temp:  97.7 F (36.5 C) 98.3 F (36.8 C)   TempSrc:  Oral Oral   SpO2: 93% 97% 97%   Weight:        Intake/Output Summary (Last 24 hours) at 07/30/2018 1915 Last data filed at 07/30/2018 1332 Gross per 24 hour  Intake 240 ml  Output -  Net 240 ml   Filed Weights   07/20/18 0914  Weight: 102.3 kg   Examination:  General exam: Alert, awake, oriented x 3 Respiratory system: Clear to auscultation. Respiratory effort normal. Cardiovascular system:RRR. No murmurs, rubs, gallops. Gastrointestinal system: Abdomen is nondistended, soft and nontender. No organomegaly or masses felt. Normal bowel sounds heard. Central nervous system: Alert and oriented. No focal neurological deficits. Extremities: No C/C/E, +pedal pulses Skin: No rashes, lesions or ulcers Psychiatry: Judgement and insight appear normal. Mood & affect appropriate.    Data Reviewed: I have personally reviewed following labs and imaging studies  CBC: Recent Labs  Lab 07/24/18 0725 07/25/18 0456 07/26/18 0459 07/27/18 0450  WBC 17.5* 16.7* 12.1* 10.5  NEUTROABS  --  14.9* 9.9* 8.1*  HGB 11.7* 13.2 12.4* 11.1*  HCT 36.5* 41.1 37.1* 34.3*  MCV 89.5 89.2 88.3 88.2  PLT 195 223 201 199   Basic Metabolic Panel: Recent Labs  Lab 07/24/18 0725 07/25/18 0456 07/26/18 0459 07/27/18 0450 07/28/18 0634  NA 142 142 136 137 138  K 3.1* 3.1* 3.7 3.7 3.7  CL 108 105 109 109 111  CO2 24 25 22 22  21*  GLUCOSE 111* 110* 96 102* 117*  BUN 25* 23 23 19 14   CREATININE 1.30* 1.25* 1.10 1.22 1.19  CALCIUM 8.7*  9.0 8.3* 8.2* 8.1*  MG 1.9 2.0 1.9 1.9 2.0  PHOS  --  1.8* 3.1  --   --    GFR: Estimated Creatinine Clearance: 71.5 mL/min (by C-G formula based on SCr of 1.19 mg/dL). Liver Function Tests: Recent Labs  Lab 07/25/18 0456 07/26/18 0459  ALBUMIN 3.0* 2.7*   No results for input(s): LIPASE, AMYLASE in the last 168 hours. No results for input(s): AMMONIA in the last 168 hours. Coagulation Profile: No results for input(s): INR, PROTIME in the last 168 hours. Cardiac Enzymes: No results for input(s): CKTOTAL, CKMB, CKMBINDEX, TROPONINI in the last 168 hours. BNP (last 3 results) No results for input(s): PROBNP in the last 8760 hours. HbA1C: No results for input(s): HGBA1C in the last 72 hours. CBG: No results for input(s): GLUCAP in the last 168 hours. Lipid Profile: No results for input(s): CHOL, HDL, LDLCALC, TRIG, CHOLHDL, LDLDIRECT in the last 72 hours. Thyroid Function Tests: No results for input(s): TSH, T4TOTAL, FREET4, T3FREE, THYROIDAB in the last 72 hours. Anemia Panel: No  results for input(s): VITAMINB12, FOLATE, FERRITIN, TIBC, IRON, RETICCTPCT in the last 72 hours. Sepsis Labs: No results for input(s): PROCALCITON, LATICACIDVEN in the last 168 hours.  Recent Results (from the past 240 hour(s))  Culture, blood (routine x 2)     Status: None   Collection Time: 07/21/18 12:12 PM  Result Value Ref Range Status   Specimen Description BLOOD LEFT WRIST  Final   Special Requests   Final    BOTTLES DRAWN AEROBIC AND ANAEROBIC Blood Culture adequate volume   Culture   Final    NO GROWTH 5 DAYS Performed at [facility], [address], [place], [place] [address]    Report Status 07/26/2018 FINAL  Final  Culture, blood (routine x 2)     Status: None   Collection Time: 07/21/18 12:12 PM  Result Value Ref Range Status   Specimen Description BLOOD RIGHT HAND  Final   Special Requests   Final    BOTTLES DRAWN AEROBIC AND ANAEROBIC Blood Culture adequate volume   Culture    Final    NO GROWTH 5 DAYS Performed at [facility], [address], [place], [place] [address]    Report Status 07/26/2018 FINAL  Final  C difficile quick scan w PCR reflex     Status: None   Collection Time: 07/25/18  9:44 AM  Result Value Ref Range Status   C Diff antigen NEGATIVE NEGATIVE Final   C Diff toxin NEGATIVE NEGATIVE Final   C Diff interpretation No C. difficile detected.  Final    Comment: Performed at [facility], [address] [place], [place] [address]    Radiology Studies: No results found. Scheduled Meds: . amLODipine  10 mg Oral Daily  . cloNIDine  0.2 mg Transdermal Weekly  . dicyclomine  10 mg Oral BID  . diphenoxylate-atropine  2 tablet Oral BID  . enoxaparin (LOVENOX) injection  30 mg Subcutaneous Q24H  . folic acid  1 mg Oral Daily  . guaiFENesin  1,200 mg Oral BID  . hydrALAZINE  50 mg Oral Q8H  . multivitamin with minerals  1 tablet Oral Daily  . ondansetron (ZOFRAN) IV  4 mg Intravenous TID WC & HS  . pantoprazole  40 mg Oral BID AC  . saccharomyces boulardii  250 mg Oral BID  . sertraline  100 mg Oral Daily  . tamsulosin  0.4 mg Oral Daily  . thiamine  100 mg Oral Daily   Or  . thiamine  100 mg Intravenous Daily   Continuous Infusions: . 0.9 % NaCl with KCl 20 mEq / L 10 mL/hr at 07/28/18 1357    LOS: 10 days   Time spent:  Erick Blinks, MD [facility]  07/30/2018, 7:15 PM

## 2018-07-30 NOTE — Telephone Encounter (Signed)
NA for EGD w/ MAC with [facility] is 10/02/2018. [facility] is aware and will add patient to cancellation list. Will call patient once d/c'd.

## 2018-07-30 NOTE — Telephone Encounter (Signed)
PT NEED EGD W/ MAC DUE TO POLYPHARMACY, Dx: ABNORMAL DUODENUM ON CT.

## 2018-07-30 NOTE — Telephone Encounter (Signed)
Patient still currently admitted. Will call once d/c'd.

## 2018-07-30 NOTE — Progress Notes (Signed)
Physical Therapy Treatment Patient Details Name: [name] MRN: [id number] DOB: 1945/07/23 Today's Date: 07/30/2018    History of Present Illness [name] is a 73 y.o. male with medical history significant of  Chronic pain syndrome, HTN, multiple falls, peripheral neuropathy, who was admitted in 05/2018 for frequent falls which was felt at that time to be related to alcohol abuse.  He suffered a C6 fracture at that time and was discharged with a cervical collar.  He has since followed up with neurosurgery and cervical collar has been discontinued.  After that last admission, he went to a skilled nursing facility for physical therapy and wife reports that he was doing well upon return home.  He is followed at a pain clinic and recently his opiates were discontinued.  Patient is still drowsy at this time and is unable to provide history.  His wife reports that he continue to take reportedly 1 tablet of MS Contin daily from his remaining prescription.  She has noted over the past week, he has been increasingly lethargic, weak.  He has had decreased p.o. intake.  He has been confused.  He is not had any fever, diarrhea or vomiting.  No complaints of chest pain or shortness of breath.  He is also been falling again lately, but is pretty much been in bed for the last week.  She feels that he sleeps 20 out of 24 hours.    PT Comments    Patient demonstrates increased endurance/distance for gait training without loss of balance, limited mostly due to BLE weakness with difficulty fully extending knees and tolerated sitting up in chair after therapy - RN aware.  Patient will benefit from continued physical therapy in hospital and recommended venue below to increase strength, balance, endurance for safe ADLs and gait.    Follow Up Recommendations  SNF;Supervision/Assistance - 24 hour;Supervision for mobility/OOB     Equipment Recommendations  None recommended by PT    Recommendations  for Other Services       Precautions / Restrictions Precautions Precautions: Fall Restrictions Weight Bearing Restrictions: No    Mobility  Bed Mobility Overal bed mobility: Needs Assistance Bed Mobility: Supine to Sit     Supine to sit: Supervision     General bed mobility comments: increased time, slightly labored movement  Transfers Overall transfer level: Needs assistance Equipment used: Rolling walker (2 wheeled) Transfers: Sit to/from [employer] Sit to Stand: Min guard Stand pivot transfers: Min guard       General transfer comment: requires less assistance, slightly labored  Ambulation/Gait Ambulation/Gait assistance: Min assist Gait Distance (Feet): 30 Feet Assistive device: Rolling walker (2 wheeled) Gait Pattern/deviations: Decreased step length - right;Decreased step length - left;Decreased stride length Gait velocity: slow   General Gait Details: increased endurance/distance for ambulation with labored cadenc, difficulty fully extending knees, limited secondary to c/o fatigue   Stairs             Wheelchair Mobility    Modified Rankin (Stroke Patients Only)       Balance Overall balance assessment: Needs assistance Sitting-balance support: Feet supported;No upper extremity supported Sitting balance-Leahy Scale: Good     Standing balance support: Bilateral upper extremity supported;During functional activity Standing balance-Leahy Scale: Fair Standing balance comment: with RW                            Cognition Arousal/Alertness: Awake/alert Behavior During Therapy: WFL for tasks  assessed/performed Overall Cognitive Status: Within Functional Limits for tasks assessed                                        Exercises General Exercises - Lower Extremity Long Arc Quad: Seated;AROM;Strengthening;Both;10 reps Hip Flexion/Marching: Seated;Strengthening;AROM;Both;10 reps Toe Raises:  Seated;Strengthening;AROM;Both;10 reps Heel Raises: Seated;AROM;Strengthening;Both;10 reps    General Comments        Pertinent Vitals/Pain Pain Assessment: 0-10 Pain Score: 4  Pain Location: low back Pain Descriptors / Indicators: Aching;Sore Pain Intervention(s): Limited activity within patient's tolerance;Monitored during session    Home Living                      Prior Function            PT Goals (current goals can now be found in the care plan section) Acute Rehab PT Goals Patient Stated Goal: return home after rehab PT Goal Formulation: With patient/family Time For Goal Achievement: 08/07/18 Potential to Achieve Goals: Good Progress towards PT goals: Progressing toward goals    Frequency    Min 3X/week      PT Plan Current plan remains appropriate    Co-evaluation              AM-PAC PT "6 Clicks" Mobility   Outcome Measure  Help needed turning from your back to your side while in a flat bed without using bedrails?: None Help needed moving from lying on your back to sitting on the side of a flat bed without using bedrails?: None Help needed moving to and from a bed to a chair (including a wheelchair)?: A Little Help needed standing up from a chair using your arms (e.g., wheelchair or bedside chair)?: A Little Help needed to walk in hospital room?: A Lot Help needed climbing 3-5 steps with a railing? : A Lot 6 Click Score: 18    End of Session   Activity Tolerance: Patient tolerated treatment well Patient left: in bed;with chair alarm set;with call bell/phone within reach Nurse Communication: Mobility status PT Visit Diagnosis: Unsteadiness on feet (R26.81);Other abnormalities of gait and mobility (R26.89);Muscle weakness (generalized) (M62.81)     Time: 9355-2174 PT Time Calculation (min) (ACUTE ONLY): 26 min  Charges:  $Gait Training: 8-22 mins $Therapeutic Exercise: 8-22 mins                     2:39 PM, 07/30/18 Ocie Bob, MPT Physical Therapist with [facility] [phone] office [phone] mobile phone

## 2018-07-31 DIAGNOSIS — K2980 Duodenitis without bleeding: Secondary | ICD-10-CM

## 2018-07-31 MED ORDER — HYDRALAZINE HCL 50 MG PO TABS: 50.0000 mg | ORAL_TABLET | Freq: Three times a day (TID) | ORAL | Status: DC

## 2018-07-31 MED ORDER — PANTOPRAZOLE SODIUM 40 MG PO TBEC: 40.0000 mg | DELAYED_RELEASE_TABLET | Freq: Every day | ORAL | Status: DC

## 2018-07-31 MED ORDER — DIPHENOXYLATE-ATROPINE 2.5-0.025 MG PO TABS: 2.0000 | ORAL_TABLET | Freq: Two times a day (BID) | ORAL | 0 refills | Status: DC

## 2018-07-31 MED ORDER — TRAMADOL HCL 50 MG PO TABS: 50.0000 mg | ORAL_TABLET | Freq: Four times a day (QID) | ORAL | 0 refills | Status: DC | PRN

## 2018-07-31 MED ORDER — DICYCLOMINE HCL 10 MG PO CAPS: 10.0000 mg | ORAL_CAPSULE | Freq: Two times a day (BID) | ORAL | Status: DC

## 2018-07-31 MED ORDER — METHOCARBAMOL 500 MG PO TABS: 500.0000 mg | ORAL_TABLET | Freq: Four times a day (QID) | ORAL | 0 refills | Status: DC | PRN

## 2018-07-31 MED ORDER — SACCHAROMYCES BOULARDII 250 MG PO CAPS: 250.0000 mg | ORAL_CAPSULE | Freq: Two times a day (BID) | ORAL | Status: DC

## 2018-07-31 NOTE — Clinical Social Work Placement (Signed)
   CLINICAL SOCIAL WORK PLACEMENT  NOTE  Date:  07/31/2018  Patient Details  Name: RUFINO TACK MRN: [id number] Date of Birth: 18-Sep-1945  Clinical Social Work is seeking post-discharge placement for this patient at the Skilled  Nursing Facility level of care (*CSW will initial, date and re-position this form in  chart as items are completed):  Yes   Patient/family provided with [place] Clinical Social Work Department's list of facilities offering this level of care within the geographic area requested by the patient (or if unable, by the patient's family).  Yes   Patient/family informed of their freedom to choose among providers that offer the needed level of care, that participate in Medicare, Medicaid or managed care program needed by the patient, have an available bed and are willing to accept the patient.  Yes   Patient/family informed of 's ownership interest in [facility] and [facility], as well as of the fact that they are under no obligation to receive care at these facilities.  PASRR submitted to EDS on       PASRR number received on       Existing PASRR number confirmed on 07/24/18     FL2 transmitted to all facilities in geographic area requested by pt/family on 07/24/18     FL2 transmitted to all facilities within larger geographic area on       Patient informed that his/her managed care company has contracts with or will negotiate with certain facilities, including the following:        Yes   Patient/family informed of bed offers received.  Patient chooses bed at [facility]     Physician recommends and patient chooses bed at      Patient to be transferred to [facility] [place] on 07/31/18.  Patient to be transferred to facility by wife     Patient family notified on 07/31/18 of transfer.  Name of family member notified:  Rendell Rabb wife     PHYSICIAN       Additional Comment: Pt to transfer to [facility] today.   Alerted wife and nursing staff.  Report to be called to [phone].  Wife will be here at 2:30 to transport. CSW sign off.   _______________________________________________ Ida Rogue, LCSW 07/31/2018, 8:40 AM

## 2018-07-31 NOTE — Progress Notes (Signed)
Physical Therapy Treatment Patient Details Name: [name] MRN: [id number] DOB: [birth year] Today's Date: 07/31/2018    History of Present Illness [name] is a 73 y.o. male with medical history significant of  Chronic pain syndrome, HTN, multiple falls, peripheral neuropathy, who was admitted in 05/2018 for frequent falls which was felt at that time to be related to alcohol abuse.  He suffered a C6 fracture at that time and was discharged with a cervical collar.  He has since followed up with neurosurgery and cervical collar has been discontinued.  After that last admission, he went to a skilled nursing facility for physical therapy and wife reports that he was doing well upon return home.  He is followed at a pain clinic and recently his opiates were discontinued.  Patient is still drowsy at this time and is unable to provide history.  His wife reports that he continue to take reportedly 1 tablet of MS Contin daily from his remaining prescription.  She has noted over the past week, he has been increasingly lethargic, weak.  He has had decreased p.o. intake.  He has been confused.  He is not had any fever, diarrhea or vomiting.  No complaints of chest pain or shortness of breath.  He is also been falling again lately, but is pretty much been in bed for the last week.  She feels that he sleeps 20 out of 24 hours.    PT Comments    Patient presents up in chair and agreeable to therapy. Patient reports return of diarrhea last night with episode this morning increasing generalized weakness sensation. Patient performs sit to stand reps from arm chair with RW at min guard requiring bil UE assist to rise and for controlled lowering. Patient dependent with TLSO brace positioning and donning. Patient ambulates 20 ft with RW and min assist demonstrating decreased cadence, decreased bil step length, flexed bil knees throughout gait cycle and decreased heel-toe pattern. Patient tolerates seated  bil LE strengthening exercises without increase in low back pain. Patient left in chair with chair alarm on and call bell in lap. Patient will benefit from continued physical therapy in hospital and recommended venue below to increase strength, balance, endurance for safe ADLs and gait.    Follow Up Recommendations  SNF;Supervision/Assistance - 24 hour;Supervision for mobility/OOB     Equipment Recommendations  None recommended by PT    Recommendations for Other Services       Precautions / Restrictions Precautions Precautions: Fall Restrictions Weight Bearing Restrictions: No    Mobility  Bed Mobility                  Transfers Overall transfer level: Needs assistance Equipment used: Rolling walker (2 wheeled) Transfers: Sit to/from Stand Sit to Stand: Min guard         General transfer comment: increased time, use of bil upper extremities to rise  Ambulation/Gait Ambulation/Gait assistance: Min assist Gait Distance (Feet): 20 Feet Assistive device: Rolling walker (2 wheeled) Gait Pattern/deviations: Decreased step length - right;Decreased step length - left;Decreased stride length Gait velocity: decreased   General Gait Details: maintains static bil knee flexion throughout gait cycle, decreased heel-toe pattern, dependent on RW for balance, limited secondary to fatigue   Stairs             Wheelchair Mobility    Modified Rankin (Stroke Patients Only)       Balance           Standing balance  support: During functional activity;Bilateral upper extremity supported Standing balance-Leahy Scale: Fair Standing balance comment: with RW                            Cognition Arousal/Alertness: Awake/alert Behavior During Therapy: WFL for tasks assessed/performed Overall Cognitive Status: Within Functional Limits for tasks assessed                                        Exercises General Exercises - Lower  Extremity Long Arc Quad: Seated;AROM;Strengthening;Both;10 reps Hip Flexion/Marching: Seated;Strengthening;AROM;Both;10 reps Toe Raises: Seated;Strengthening;AROM;Both;10 reps Heel Raises: Seated;AROM;Strengthening;Both;10 reps    General Comments        Pertinent Vitals/Pain Pain Assessment: 0-10 Pain Score: 5  Pain Location: low back Pain Descriptors / Indicators: Aching;Sore Pain Intervention(s): Limited activity within patient's tolerance;Monitored during session;Repositioned    Home Living                      Prior Function            PT Goals (current goals can now be found in the care plan section) Acute Rehab PT Goals Patient Stated Goal: return home after rehab PT Goal Formulation: With patient/family Time For Goal Achievement: 08/07/18 Potential to Achieve Goals: Good Progress towards PT goals: Progressing toward goals    Frequency    Min 2X/week      PT Plan Current plan remains appropriate    Co-evaluation              AM-PAC PT "6 Clicks" Mobility   Outcome Measure  Help needed turning from your back to your side while in a flat bed without using bedrails?: None Help needed moving from lying on your back to sitting on the side of a flat bed without using bedrails?: None Help needed moving to and from a bed to a chair (including a wheelchair)?: A Little Help needed standing up from a chair using your arms (e.g., wheelchair or bedside chair)?: A Little Help needed to walk in hospital room?: A Lot Help needed climbing 3-5 steps with a railing? : A Lot 6 Click Score: 18    End of Session Equipment Utilized During Treatment: Gait belt;Other (comment)(TLSO brace) Activity Tolerance: Patient tolerated treatment well Patient left: in chair;with call bell/phone within reach;with chair alarm set Nurse Communication: Mobility status PT Visit Diagnosis: Unsteadiness on feet (R26.81);Other abnormalities of gait and mobility (R26.89);Muscle  weakness (generalized) (M62.81)     Time: 1007-1219 PT Time Calculation (min) (ACUTE ONLY): 15 min  Charges:  $Gait Training: 8-22 mins                     9:38 AM, 07/31/18 Domenick Bookbinder, DPT Physical Therapist with [facility] [phone] office

## 2018-07-31 NOTE — Care Management Important Message (Signed)
Important Message  Patient Details  Name: KAVIEN GLORIOSO MRN: [id number] Date of Birth: 13-Dec-1945   Medicare Important Message Given:  Yes    Lekeya Rollings, Chrystine Oiler, RN 07/31/2018, 9:52 AM

## 2018-07-31 NOTE — Progress Notes (Signed)
Report called to [facility] at this time 

## 2018-07-31 NOTE — Plan of Care (Signed)

## 2018-07-31 NOTE — Plan of Care (Signed)
  Problem: Education: Goal: Knowledge of General Education information will improve Description Including pain rating scale, medication(s)/side effects and non-pharmacologic comfort measures 07/31/2018 1411 by Darreld Mclean, RN Outcome: Adequate for Discharge 07/31/2018 1050 by Darreld Mclean, RN Outcome: Progressing   Problem: Health Behavior/Discharge Planning: Goal: Ability to manage health-related needs will improve 07/31/2018 1411 by Darreld Mclean, RN Outcome: Adequate for Discharge 07/31/2018 1050 by Darreld Mclean, RN Outcome: Progressing   Problem: Clinical Measurements: Goal: Ability to maintain clinical measurements within normal limits will improve 07/31/2018 1411 by Darreld Mclean, RN Outcome: Adequate for Discharge 07/31/2018 1050 by Darreld Mclean, RN Outcome: Progressing Goal: Will remain free from infection 07/31/2018 1411 by Darreld Mclean, RN Outcome: Adequate for Discharge 07/31/2018 1050 by Darreld Mclean, RN Outcome: Progressing Goal: Diagnostic test results will improve 07/31/2018 1411 by Darreld Mclean, RN Outcome: Adequate for Discharge 07/31/2018 1050 by Darreld Mclean, RN Outcome: Progressing Goal: Respiratory complications will improve 07/31/2018 1411 by Darreld Mclean, RN Outcome: Adequate for Discharge 07/31/2018 1050 by Darreld Mclean, RN Outcome: Progressing Goal: Cardiovascular complication will be avoided 07/31/2018 1411 by Darreld Mclean, RN Outcome: Adequate for Discharge 07/31/2018 1050 by Darreld Mclean, RN Outcome: Progressing   Problem: Activity: Goal: Risk for activity intolerance will decrease 07/31/2018 1411 by Darreld Mclean, RN Outcome: Adequate for Discharge 07/31/2018 1050 by Darreld Mclean, RN Outcome: Progressing   Problem: Nutrition: Goal: Adequate nutrition will be maintained 07/31/2018 1411 by Darreld Mclean, RN Outcome: Adequate for Discharge 07/31/2018 1050 by Darreld Mclean, RN Outcome: Progressing   Problem: Coping: Goal: Level of anxiety will decrease 07/31/2018 1411 by Darreld Mclean, RN Outcome: Adequate for  Discharge 07/31/2018 1050 by Darreld Mclean, RN Outcome: Progressing   Problem: Elimination: Goal: Will not experience complications related to bowel motility 07/31/2018 1411 by Darreld Mclean, RN Outcome: Adequate for Discharge 07/31/2018 1050 by Darreld Mclean, RN Outcome: Progressing Goal: Will not experience complications related to urinary retention 07/31/2018 1411 by Darreld Mclean, RN Outcome: Adequate for Discharge 07/31/2018 1050 by Darreld Mclean, RN Outcome: Progressing   Problem: Pain Managment: Goal: General experience of comfort will improve 07/31/2018 1411 by Darreld Mclean, RN Outcome: Adequate for Discharge 07/31/2018 1050 by Darreld Mclean, RN Outcome: Progressing   Problem: Safety: Goal: Ability to remain free from injury will improve 07/31/2018 1411 by Darreld Mclean, RN Outcome: Adequate for Discharge 07/31/2018 1050 by Darreld Mclean, RN Outcome: Progressing   Problem: Skin Integrity: Goal: Risk for impaired skin integrity will decrease 07/31/2018 1411 by Darreld Mclean, RN Outcome: Adequate for Discharge 07/31/2018 1050 by Darreld Mclean, RN Outcome: Progressing

## 2018-07-31 NOTE — Discharge Summary (Signed)
Physician Discharge Summary  [name]:[id number] DOB: 01-20-1946 DOA: 07/20/2018  PCP: Vivien Presto, MD  Admit date: 07/20/2018 Discharge date: 07/31/2018  Admitted From: Home Disposition: Skilled nursing facility  Recommendations for Outpatient Follow-up:  1. Follow up with PCP in 1-2 weeks 2. Please obtain BMP/CBC in one week 3. Patient is scheduled to follow-up with Dr. Gerilyn Pilgrim for pain management on 2/5 at 11 AM 4. He will be scheduled to follow-up with gastroenterology for endoscopy.  Discharge Condition:stable CODE STATUS:full code Diet recommendation: heart healthy  Brief/Interim Summary: 73 year old male with a history of chronic pain syndrome, hypertension, multiple falls, brought to the hospital with worsening mental status, decreased p.o. intake and repeated falls.  He was found to have dehydration and acute renal failure.  Was felt that his change in mental status likely related to medications in the setting of the renal failure.  Hospital course was complicated by development of aspiration pneumonia.  He was treated with intravenous antibiotics.  Seen by physical therapy with recommendations for skilled nursing facility placement.  Discharge Diagnoses:  Active Problems:   Chronic pain syndrome   Essential hypertension   Ankylosing spondylitis (HCC)   Acute encephalopathy   Acute kidney injury (HCC)   Dehydration   Opiate overdose (HCC)   Aspiration pneumonia of both lower lobes due to gastric secretions (HCC)   Acute respiratory failure with hypoxia (HCC)   Compression fracture of T10 vertebra (HCC)   Duodenitis   Nausea and vomiting   Opiate use   Diarrhea  1. Acute encephalopathy. RESOLVED.  Related to opiate use as well as other sedative medications in the setting of renal failure.  He initially did respond to Narcan in the emergency room.  Overall mental status has substantially improved and he is now able to carry on a conversation.   Continue to follow.  Holding all sedative medications.  CT imaging of the head and neck on admission was unrevealing.  He is slowly improving.  PT recommending SNF placement.  2. Acute kidney injury.  IMPROVING with gently hydration.  Likely related to dehydration, ARB use and Lasix use.  Nephrotoxic agents currently on hold.  He has been treated with gentle hydration.  Renal function is slowly improving. 3. Hypokalemia - REPLETED.  IV replacement given, magnesium stable.  4. Unintentional opiate overdose.  The patient takes MS Contin amongst other sedatives.  Overall mental status is slowly improving.  Continue to monitor.  Morphine was not restarted.  He has appointment to establish care with [facility] [place] pain management 08/08/18 at 11 am.  5. Acute respiratory failure.  RESOLVED.  Related to pneumonia.  Currently breathing comfortably on room air. 6. Duondenal wall abnormality - Appreciate GI assistance, will need outpatient EGD in 3-4 weeks.   7. Diarrhea - slowly improving, c diff negative, added lomotil and added probiotics - florastor BID.  DC antibiotics 1/23. Appreciate GI team assistance.  Temporary rectal tube was inserted, but now removed.  Diarrhea has improved after scheduled Lomotil.   8. Aspiration Pneumonia, likely aspiration related. TREATED.  Patient was noted to have fever and had bilateral rhonchi.  Chest x-ray indicated pneumonia.   Influenza panel is negative.  Blood cultures have shown no growth.    Patient is feeling better, he is not hypoxic.  No further fevers.  He has completed his antibiotics. 9. T10 compression fracture - TLSO brace ordered. Pt uses it with ambulation.  He is tolerating it so far. He is going  to SNF for rehab.   10. Hypertension.  Temporarily holding losartan/Lasix.   Blood pressures better controlled after adding hydralazine.   11. Chronic pain syndrome.  Pt requesting no opioids at this time.  Establish care with Dr. Ronal Fear office for  chronic pain management as scheduled on 08/08/18 at 11 am.   12. Generalized weakness.  Physical therapy recommending SNF placement.  13. Urinary retention.  Has briefly required in and out catheterization x2.  Possibly related to immobility/anticholinergic effects of medications.  He is also been restarted on Flomax.  He seems to be urinating without difficulty now.  Continue to monitor.    Discharge Instructions  Discharge Instructions    Ambulatory referral to Pain Clinic   Complete by:  As directed    Diet - low sodium heart healthy   Complete by:  As directed    Increase activity slowly   Complete by:  As directed      Allergies as of 07/31/2018      Reactions   Codeine Nausea Only      Medication List    STOP taking these medications   furosemide 40 MG tablet Commonly known as:  LASIX   losartan 100 MG tablet Commonly known as:  COZAAR   morphine 30 MG 12 hr tablet Commonly known as:  MS CONTIN   naproxen sodium 220 MG tablet Commonly known as:  ALEVE   pregabalin 150 MG capsule Commonly known as:  LYRICA     TAKE these medications   amLODipine 10 MG tablet Commonly known as:  NORVASC Take 10 mg by mouth daily.   cloNIDine 0.2 mg/24hr patch Commonly known as:  CATAPRES - Dosed in mg/24 hr Place 0.2 mg onto the skin once a week.   dicyclomine 10 MG capsule Commonly known as:  BENTYL Take 1 capsule (10 mg total) by mouth 2 (two) times daily.   diphenoxylate-atropine 2.5-0.025 MG tablet Commonly known as:  LOMOTIL Take 2 tablets by mouth 2 (two) times daily.   hydrALAZINE 50 MG tablet Commonly known as:  APRESOLINE Take 1 tablet (50 mg total) by mouth every 8 (eight) hours.   methocarbamol 500 MG tablet Commonly known as:  ROBAXIN Take 1 tablet (500 mg total) by mouth every 6 (six) hours as needed for muscle spasms.   pantoprazole 40 MG tablet Commonly known as:  PROTONIX Take 1 tablet (40 mg total) by mouth daily.   saccharomyces boulardii 250 MG  capsule Commonly known as:  FLORASTOR Take 1 capsule (250 mg total) by mouth 2 (two) times daily.   sertraline 100 MG tablet Commonly known as:  ZOLOFT Take 100 mg by mouth daily.   tamsulosin 0.4 MG Caps capsule Commonly known as:  FLOMAX Take 1 capsule (0.4 mg total) by mouth daily after supper.   traMADol 50 MG tablet Commonly known as:  ULTRAM Take 1 tablet (50 mg total) by mouth every 6 (six) hours as needed.       Contact information for follow-up providers    Beryle Beams, MD. Go on 08/08/2018.   Specialty:  Neurology Why:  scheduled for 11 am for NEW pain management consultation  Contact information: [address] [place] [facility] [address] [phone]            Contact information for after-discharge care    Destination    [facility] .   Service:  Skilled Paramedic information: [address] [place] [address] [phone]  Allergies  Allergen Reactions  . Codeine Nausea Only    Consultations:  Gastroenterology   Procedures/Studies: Dg Chest 1 View  Result Date: 07/20/2018 CLINICAL DATA:  Fall, altered mental status. EXAM: CHEST  1 VIEW COMPARISON:  Radiographs of February 14, 2018. FINDINGS: Stable cardiomegaly. No pneumothorax or pleural effusion is noted. Minimal bibasilar subsegmental atelectasis is noted. Old right rib fractures are noted. IMPRESSION: Minimal bibasilar subsegmental atelectasis. Electronically Signed   By: Lupita RaiderJames  Green Jr, M.D.   On: 07/20/2018 10:36   Dg Lumbar Spine Complete  Result Date: 07/20/2018 CLINICAL DATA:  Multiple falls. EXAM: LUMBAR SPINE - COMPLETE 4+ VIEW COMPARISON:  Radiographs of February 21, 2018. FINDINGS: No fracture or spondylolisthesis is noted. Large anterior osteophyte formation is noted at L1-2, L3-4 and L5-S1. Moderate degenerative disc disease is noted at L5-S1. Atherosclerosis of abdominal aorta is noted. IMPRESSION: Degenerative  changes as described above. No acute abnormality seen in the lumbar spine. Aortic Atherosclerosis (ICD10-I70.0). Electronically Signed   By: Lupita RaiderJames  Green Jr, M.D.   On: 07/20/2018 10:38   Ct Head Wo Contrast  Result Date: 07/24/2018 CLINICAL DATA:  73 y/o M; found on floor of bathroom. Altered level of consciousness. EXAM: CT HEAD WITHOUT CONTRAST TECHNIQUE: Contiguous axial images were obtained from the base of the skull through the vertex without intravenous contrast. COMPARISON:  07/20/2018 05/08/2018 CT head. FINDINGS: Brain: No evidence of acute infarction, hemorrhage, hydrocephalus, extra-axial collection or mass lesion/mass effect. Stable nonspecific white matter hypodensities compatible with chronic microvascular ischemic changes. Stable volume loss of the brain. Vascular: Calcific atherosclerosis of the carotid siphons. No hyperdense vessel identified Skull: Normal. Negative for fracture or focal lesion. Sinuses/Orbits: No acute finding. Other: Partially visualized cranial cervical fusion hardware. IMPRESSION: 1. No acute intracranial abnormality or calvarial fracture identified. 2. Stable chronic microvascular ischemic changes and volume loss of the brain. Electronically Signed   By: Mitzi HansenLance  Furusawa-Stratton M.D.   On: 07/24/2018 23:07   Ct Head Wo Contrast  Result Date: 07/20/2018 CLINICAL DATA:  73 year old male with history of trauma from a fall 2 days ago with injury to the head. Additional fall yesterday without injury to the head. EXAM: CT HEAD WITHOUT CONTRAST CT CERVICAL SPINE WITHOUT CONTRAST TECHNIQUE: Multidetector CT imaging of the head and cervical spine was performed following the standard protocol without intravenous contrast. Multiplanar CT image reconstructions of the cervical spine were also generated. COMPARISON:  Head CT and cervical spine CT 05/08/2018. FINDINGS: CT HEAD FINDINGS Brain: Mild cerebral atrophy with associated ex vacuo dilatation of the ventricular system. Patchy  and confluent areas of decreased attenuation are noted throughout the deep and periventricular white matter of the cerebral hemispheres bilaterally, compatible with chronic microvascular ischemic disease. No evidence of acute infarction, hemorrhage, hydrocephalus, extra-axial collection or mass lesion/mass effect. Vascular: No hyperdense vessel or unexpected calcification. Skull: Normal. Negative for fracture or focal lesion. Sinuses/Orbits: No acute finding. Other: Orthopedic fixation hardware associated with the occipital bone. CT CERVICAL SPINE FINDINGS Alignment: Mild straightening of normal cervical lordosis, similar to the prior study. Skull base and vertebrae: Postoperative changes of fusion of the occiput through C4. No acute hardware complications. Known fracture of C6 which is obliquely oriented extending from the superior aspect of the anterior vertebral body through the inferior aspect of the posterior vertebral body, as well as into the right inferior C6 facet and the C6-C7 facet joint. Compared to the prior examination, there is increasing loss of central vertebral body height (approximately 25%), and increasing sclerosis in  the vertebral body related to normal healing. No new acute displaced fracture is otherwise noted. Soft tissues and spinal canal: No prevertebral fluid or swelling. No visible canal hematoma. Disc levels: Multilevel degenerative disc disease again noted, most severe at C6-C7. Extensive anterior osteophytosis throughout the cervical spine, with fracture at C6 extending through the anterior osteophytes. Multilevel facet arthropathy. Upper chest: Unremarkable. Other: None. IMPRESSION: 1. No acute displaced skull fracture or signs of significant acute intracranial trauma. 2. Old fracture of C6 demonstrates evidence of some healing and some mild compression compared to the prior study. No new acute findings are noted elsewhere in the cervical spine. 3. Postoperative changes of  occipital-cervical fusion redemonstrated, without acute complicating features, as above. 4. Mild cerebral atrophy with chronic microvascular ischemic changes in the cerebral white matter, as above. 5. Multilevel degenerative disc disease and cervical spondylosis, as above. Electronically Signed   By: Trudie Reedaniel  Entrikin M.D.   On: 07/20/2018 10:59   Ct Cervical Spine Wo Contrast  Result Date: 07/20/2018 CLINICAL DATA:  73 year old male with history of trauma from a fall 2 days ago with injury to the head. Additional fall yesterday without injury to the head. EXAM: CT HEAD WITHOUT CONTRAST CT CERVICAL SPINE WITHOUT CONTRAST TECHNIQUE: Multidetector CT imaging of the head and cervical spine was performed following the standard protocol without intravenous contrast. Multiplanar CT image reconstructions of the cervical spine were also generated. COMPARISON:  Head CT and cervical spine CT 05/08/2018. FINDINGS: CT HEAD FINDINGS Brain: Mild cerebral atrophy with associated ex vacuo dilatation of the ventricular system. Patchy and confluent areas of decreased attenuation are noted throughout the deep and periventricular white matter of the cerebral hemispheres bilaterally, compatible with chronic microvascular ischemic disease. No evidence of acute infarction, hemorrhage, hydrocephalus, extra-axial collection or mass lesion/mass effect. Vascular: No hyperdense vessel or unexpected calcification. Skull: Normal. Negative for fracture or focal lesion. Sinuses/Orbits: No acute finding. Other: Orthopedic fixation hardware associated with the occipital bone. CT CERVICAL SPINE FINDINGS Alignment: Mild straightening of normal cervical lordosis, similar to the prior study. Skull base and vertebrae: Postoperative changes of fusion of the occiput through C4. No acute hardware complications. Known fracture of C6 which is obliquely oriented extending from the superior aspect of the anterior vertebral body through the inferior aspect of  the posterior vertebral body, as well as into the right inferior C6 facet and the C6-C7 facet joint. Compared to the prior examination, there is increasing loss of central vertebral body height (approximately 25%), and increasing sclerosis in the vertebral body related to normal healing. No new acute displaced fracture is otherwise noted. Soft tissues and spinal canal: No prevertebral fluid or swelling. No visible canal hematoma. Disc levels: Multilevel degenerative disc disease again noted, most severe at C6-C7. Extensive anterior osteophytosis throughout the cervical spine, with fracture at C6 extending through the anterior osteophytes. Multilevel facet arthropathy. Upper chest: Unremarkable. Other: None. IMPRESSION: 1. No acute displaced skull fracture or signs of significant acute intracranial trauma. 2. Old fracture of C6 demonstrates evidence of some healing and some mild compression compared to the prior study. No new acute findings are noted elsewhere in the cervical spine. 3. Postoperative changes of occipital-cervical fusion redemonstrated, without acute complicating features, as above. 4. Mild cerebral atrophy with chronic microvascular ischemic changes in the cerebral white matter, as above. 5. Multilevel degenerative disc disease and cervical spondylosis, as above. Electronically Signed   By: Trudie Reedaniel  Entrikin M.D.   On: 07/20/2018 10:59   Ct Abdomen Pelvis W Contrast  Result Date: 07/25/2018 CLINICAL DATA:  Generalized weakness with loss of appetite. Abdominal pain. EXAM: CT ABDOMEN AND PELVIS WITH CONTRAST TECHNIQUE: Multidetector CT imaging of the abdomen and pelvis was performed using the standard protocol following bolus administration of intravenous contrast. CONTRAST:  ISOVUE-300 IOPAMIDOL (ISOVUE-300) INJECTION 61% COMPARISON:  None. FINDINGS: Lower chest: Heart is enlarged. Patchy airspace disease is identified in both lower lungs, left greater than right suggesting pneumonia.  Hepatobiliary: No suspicious focal abnormality within the liver parenchyma. Gallbladder surgically absent. No intrahepatic or extrahepatic biliary dilation. Pancreas: No focal mass lesion. No dilatation of the main duct. No intraparenchymal cyst. No peripancreatic edema. Spleen: No splenomegaly. No focal mass lesion. Adrenals/Urinary Tract: No adrenal nodule or mass. Kidneys unremarkable. No evidence for hydroureter. Anterior bladder wall appears tethered to the anterior peritoneum of the lower anterior abdominal wall but is otherwise unremarkable. Stomach/Bowel: Stomach is distended with contrast material. Duodenum is normally positioned as is the ligament of Treitz. Mild circumferential duodenal wall thickening evident (39/2) with subtle periduodenal edema/inflammation. No small bowel wall thickening. No small bowel dilatation. The terminal ileum is normal. The appendix is normal. No gross colonic mass. No colonic wall thickening. Vascular/Lymphatic: There is abdominal aortic atherosclerosis without aneurysm. There is no gastrohepatic or hepatoduodenal ligament lymphadenopathy. No intraperitoneal or retroperitoneal lymphadenopathy. No pelvic sidewall lymphadenopathy. Reproductive: The prostate gland and seminal vesicles are unremarkable. Other: No intraperitoneal free fluid. Musculoskeletal: No worrisome lytic or sclerotic osseous abnormality. Thoracic spinal stimulator device evident. T10 inferior endplate compression fracture new since 03/14/2018. IMPRESSION: 1. Mild circumferential duodenal wall thickening with subtle periduodenal edema/inflammation. Imaging features suggest duodenitis. 2. Patchy airspace disease in both lower lungs, left greater than right, compatible with pneumonia. 3. T10 inferior endplate compression fracture new since 03/14/2018. 4.  Aortic Atherosclerois (ICD10-170.0) Electronically Signed   By: Kennith Center M.D.   On: 07/25/2018 13:28   Dg Chest Port 1 View  Result Date:  07/21/2018 CLINICAL DATA:  Fever and hypoxia EXAM: PORTABLE CHEST 1 VIEW COMPARISON:  July 20, 2018 FINDINGS: The cardiomediastinal silhouette is stable. Spinal stimulator wires are noted. No pneumothorax. Opacity seen in the medial right base. Opacity in the left retrocardiac region suspected. IMPRESSION: Opacity in the medial right lung base is suspicious for developing infiltrate. Opacity in left retrocardiac region may be infiltrate or atelectasis. Recommend follow-up to resolution. Electronically Signed   By: Gerome Sam III M.D   On: 07/21/2018 15:16   Dg Abd Portable 1v  Result Date: 07/24/2018 CLINICAL DATA:  Nausea and vomiting EXAM: PORTABLE ABDOMEN - 1 VIEW COMPARISON:  07/20/2018 FINDINGS: Ascending thoracic stimulator leads. Surgical clips in the right upper quadrant. Borderline dilated gas-filled left lower quadrant small bowel loops with overall increased small bowel gas. Phleboliths in the pelvis. IMPRESSION: 1. Diffuse increased small bowel gas with scattered gas in the colon and borderline dilated loops of small bowel in the left lower quadrant. Findings could be secondary to enteritis/ileus. No definite radiographic evidence for high-grade bowel obstruction. Electronically Signed   By: Jasmine Pang M.D.   On: 07/24/2018 19:26       Subjective: Had some diarrhea overnight.  No chest pain or shortness of breath.  Feeling better.  Discharge Exam: Vitals:   07/30/18 1455 07/30/18 2059 07/30/18 2152 07/31/18 0557  BP: (!) 111/48  (!) 122/53 134/72  Pulse:   (!) 59 (!) 58  Resp:    16  Temp:   98.4 F (36.9 C) 98.7 F (37.1 C)  TempSrc:  Oral Oral  SpO2:  97% 97% 94%  Weight:        General: Pt is alert, awake, not in acute distress Cardiovascular: RRR, S1/S2 +, no rubs, no gallops Respiratory: CTA bilaterally, no wheezing, no rhonchi Abdominal: Soft, NT, ND, bowel sounds + Extremities: no edema, no cyanosis    The results of significant diagnostics from this  hospitalization (including imaging, microbiology, ancillary and laboratory) are listed below for reference.     Microbiology: Recent Results (from the past 240 hour(s))  C difficile quick scan w PCR reflex     Status: None   Collection Time: 07/25/18  9:44 AM  Result Value Ref Range Status   C Diff antigen NEGATIVE NEGATIVE Final   C Diff toxin NEGATIVE NEGATIVE Final   C Diff interpretation No C. difficile detected.  Final    Comment: Performed at [facility], [address], [place], [place] [address]     Labs: BNP (last 3 results) Recent Labs    07/21/18 0554  BNP 701.0*   Basic Metabolic Panel: Recent Labs  Lab 07/25/18 0456 07/26/18 0459 07/27/18 0450 07/28/18 0634  NA 142 136 137 138  K 3.1* 3.7 3.7 3.7  CL 105 109 109 111  CO2 25 22 22  21*  GLUCOSE 110* 96 102* 117*  BUN 23 23 19 14   CREATININE 1.25* 1.10 1.22 1.19  CALCIUM 9.0 8.3* 8.2* 8.1*  MG 2.0 1.9 1.9 2.0  PHOS 1.8* 3.1  --   --    Liver Function Tests: Recent Labs  Lab 07/25/18 0456 07/26/18 0459  ALBUMIN 3.0* 2.7*   No results for input(s): LIPASE, AMYLASE in the last 168 hours. No results for input(s): AMMONIA in the last 168 hours. CBC: Recent Labs  Lab 07/25/18 0456 07/26/18 0459 07/27/18 0450  WBC 16.7* 12.1* 10.5  NEUTROABS 14.9* 9.9* 8.1*  HGB 13.2 12.4* 11.1*  HCT 41.1 37.1* 34.3*  MCV 89.2 88.3 88.2  PLT 223 201 199   Cardiac Enzymes: No results for input(s): CKTOTAL, CKMB, CKMBINDEX, TROPONINI in the last 168 hours. BNP: Invalid input(s): POCBNP CBG: No results for input(s): GLUCAP in the last 168 hours. D-Dimer No results for input(s): DDIMER in the last 72 hours. Hgb A1c No results for input(s): HGBA1C in the last 72 hours. Lipid Profile No results for input(s): CHOL, HDL, LDLCALC, TRIG, CHOLHDL, LDLDIRECT in the last 72 hours. Thyroid function studies No results for input(s): TSH, T4TOTAL, T3FREE, THYROIDAB in the last 72 hours.  Invalid input(s): FREET3 Anemia  work up No results for input(s): VITAMINB12, FOLATE, FERRITIN, TIBC, IRON, RETICCTPCT in the last 72 hours. Urinalysis    Component Value Date/Time   COLORURINE YELLOW 07/20/2018 0928   APPEARANCEUR CLEAR 07/20/2018 0928   LABSPEC 1.012 07/20/2018 0928   PHURINE 5.0 07/20/2018 0928   GLUCOSEU NEGATIVE 07/20/2018 0928   HGBUR MODERATE (A) 07/20/2018 0928   BILIRUBINUR NEGATIVE 07/20/2018 0928   KETONESUR NEGATIVE 07/20/2018 0928   PROTEINUR NEGATIVE 07/20/2018 0928   UROBILINOGEN 0.2 02/28/2012 1652   NITRITE NEGATIVE 07/20/2018 0928   LEUKOCYTESUR MODERATE (A) 07/20/2018 0928   Sepsis Labs Invalid input(s): PROCALCITONIN,  WBC,  LACTICIDVEN Microbiology Recent Results (from the past 240 hour(s))  C difficile quick scan w PCR reflex     Status: None   Collection Time: 07/25/18  9:44 AM  Result Value Ref Range Status   C Diff antigen NEGATIVE NEGATIVE Final   C Diff toxin NEGATIVE NEGATIVE Final   C Diff interpretation No  C. difficile detected.  Final    Comment: Performed at [facility], [address], [place], [place] [address]     Time coordinating discharge:  SIGNED:   Erick Blinks, MD  [facility] 07/31/2018, 12:57 PM   If 7PM-7AM, please contact night-coverage [email or web address]

## 2018-08-01 ENCOUNTER — Other Ambulatory Visit: Payer: Self-pay | Admitting: *Deleted

## 2018-08-01 DIAGNOSIS — R9389 Abnormal findings on diagnostic imaging of other specified body structures: Secondary | ICD-10-CM

## 2018-08-01 NOTE — Telephone Encounter (Signed)
Pre-op letter mailed with instructions. 

## 2018-08-01 NOTE — Telephone Encounter (Signed)
[name] called and LMOVM to call back. I called [name] back and had to LM.

## 2018-08-01 NOTE — Telephone Encounter (Addendum)
LMOVM to call back on VM for Windell Moulding (spouse)

## 2018-08-01 NOTE — Telephone Encounter (Addendum)
Spoke with spouse and is aware of appt details for EGD. I have mailed instructions. Aware will call if I have a cancellation for sooner appt. Will also mail pre-op appt with instructions.

## 2018-09-17 ENCOUNTER — Ambulatory Visit: Payer: Medicare Other | Admitting: Neurology

## 2018-09-25 ENCOUNTER — Encounter (HOSPITAL_COMMUNITY)
Admission: RE | Admit: 2018-09-25 | Discharge: 2018-09-25 | Disposition: A | Discharge status: Home / Self Care | Payer: Medicare Other | Source: Ambulatory Visit | Attending: Gastroenterology | Admitting: Gastroenterology

## 2018-09-25 ENCOUNTER — Telehealth: Payer: Self-pay | Admitting: Gastroenterology

## 2018-09-25 ENCOUNTER — Other Ambulatory Visit: Payer: Self-pay

## 2018-09-25 NOTE — Telephone Encounter (Signed)
SPOKE TO PT.  PT DENIES NAUSEA, VOMITING, OR ABDOMINAL PAIN. WHEN FIRST GOT HOME HAVING 3 BMs A DAY. CURRENTLY 1-2 BMs. RESCHEDULE EGD FOR 4 WEEKS FROM MAR 31.

## 2018-09-26 NOTE — Telephone Encounter (Signed)
SPOKE TO PT'S WIFE. Pt did not need to show up for his PREOP APPT because he had current labs. They understand we will [facility] HIS EGD in 4-6 weeks.

## 2018-09-26 NOTE — Telephone Encounter (Signed)
Per Dr. Darrick Penna she will call the patient to discuss appt date and time

## 2018-09-27 NOTE — Telephone Encounter (Signed)
Per CM EGD can be moved to 10/09/18 at 7:30am. Called pt's wife and informed her. New instructions mailed. Informed endo scheduler.

## 2018-09-27 NOTE — Telephone Encounter (Signed)
Routing to clinical pool to reschedule patient

## 2018-10-02 ENCOUNTER — Encounter (HOSPITAL_COMMUNITY): Payer: Self-pay

## 2018-10-02 ENCOUNTER — Other Ambulatory Visit: Payer: Self-pay

## 2018-10-02 ENCOUNTER — Encounter (HOSPITAL_COMMUNITY)
Admission: RE | Admit: 2018-10-02 | Discharge: 2018-10-02 | Disposition: A | Discharge status: Home / Self Care | Payer: Medicare Other | Source: Ambulatory Visit | Attending: Gastroenterology | Admitting: Gastroenterology

## 2018-10-05 ENCOUNTER — Telehealth: Payer: Self-pay | Admitting: Gastroenterology

## 2018-10-05 NOTE — Telephone Encounter (Signed)
Routing to clinical pool to reschedule  

## 2018-10-05 NOTE — Telephone Encounter (Signed)
Called patient TO DISCUSS SYMPTOMS. PT HAVING LESS LOOSE STOOL AND DENIES NAUSEA/VOMITING/ABDOMINAL PAIN. EGD SCHEDULED TO EVALUATE ABNL CT JAN 2020. TAKING ONE LOMOTIL DAILY, ALEVE PRN, PROTONIX QD.  EXPLAINED IF HE IS IMPROVED  [facility] HAS ASKED TO [facility] TO REDUCE RISK OF EXPOSURE TO COVID 19 AND TO PRESERVE PPE. PATIENT VOICED HIS UNDERSTANDING.   [facility] EGD IN 4-6 WEEKS, DX: DUODENITIS .

## 2018-10-08 NOTE — Telephone Encounter (Signed)
Spoke with patient and he is r/s'd to 11/13/2018 at 8:30am. Aware will mail new instructions/pre-op appt

## 2018-10-29 ENCOUNTER — Telehealth: Payer: Self-pay | Admitting: *Deleted

## 2018-10-29 NOTE — Telephone Encounter (Signed)
Per [facility] r/s procedure to 5/4.  Called patient and spoke with spouse Windell Moulding (on dpr). She states it was fine to r/s procedure to 5/4. He is on for 8:30am that day. Called endo and new pre-op appt is scheduled for 4/30 at 10am.   Called Windell Moulding back and made aware of pre-op appt. She voiced understanding. I discussed instructions for EGD in detail with her.

## 2018-10-30 NOTE — Patient Instructions (Signed)
[name]  10/30/2018     @PREFPERIOPPHARMACY @   Your procedure is scheduled on  11/05/2018 .  Report to [facility] at  700  A.M.  Call this number if you have problems the morning of surgery:  [phone]   Remember:  Follow the diet instructions given to you by Dr Evelina Dun office.                     Take these medicines the morning of surgery with A SIP OF WATER Amlodipine, belbuca, cymbalta, zyrtec, lyrica.    Do not wear jewelry, make-up or nail polish.  Do not wear lotions, powders, or perfumes, or deodorant.  Do not shave 48 hours prior to surgery.  Men may shave face and neck.  Do not bring valuables to the hospital.  [facility] is not responsible for any belongings or valuables.  Contacts, dentures or bridgework may not be worn into surgery.  Leave your suitcase in the car.  After surgery it may be brought to your room.  For patients admitted to the hospital, discharge time will be determined by your treatment team.  Patients discharged the day of surgery will not be allowed to drive home.   Name and phone number of your driver:   family Special instructions:  None  Please read over the following fact sheets that you were given. Anesthesia Post-op Instructions and Care and Recovery After Surgery      Upper Endoscopy, Adult, Care After This sheet gives you information about how to care for yourself after your procedure. Your health care provider may also give you more specific instructions. If you have problems or questions, contact your health care provider. What can I expect after the procedure? After the procedure, it is common to have:  A sore throat.  Mild stomach pain or discomfort.  Bloating.  Nausea. Follow these instructions at home:   Follow instructions from your health care provider about what to eat or drink after your procedure.  Return to your normal activities as told by your health care provider. Ask your health care  provider what activities are safe for you.  Take over-the-counter and prescription medicines only as told by your health care provider.  Do not drive for 24 hours if you were given a sedative during your procedure.  Keep all follow-up visits as told by your health care provider. This is important. Contact a health care provider if you have:  A sore throat that lasts longer than one day.  Trouble swallowing. Get help right away if:  You vomit blood or your vomit looks like coffee grounds.  You have: ? A fever. ? Bloody, black, or tarry stools. ? A severe sore throat or you cannot swallow. ? Difficulty breathing. ? Severe pain in your chest or abdomen. Summary  After the procedure, it is common to have a sore throat, mild stomach discomfort, bloating, and nausea.  Do not drive for 24 hours if you were given a sedative during the procedure.  Follow instructions from your health care provider about what to eat or drink after your procedure.  Return to your normal activities as told by your health care provider. This information is not intended to replace advice given to you by your health care provider. Make sure you discuss any questions you have with your health care provider. Document Released: 12/20/2011 Document Revised: 11/20/2017 Document Reviewed: 11/20/2017 Elsevier Interactive Patient Education  2019 [employer].

## 2018-11-01 ENCOUNTER — Encounter (HOSPITAL_COMMUNITY)
Admission: RE | Admit: 2018-11-01 | Discharge: 2018-11-01 | Disposition: A | Discharge status: Home / Self Care | Payer: Medicare Other | Source: Ambulatory Visit | Attending: Gastroenterology | Admitting: Gastroenterology

## 2018-11-01 ENCOUNTER — Other Ambulatory Visit: Payer: Self-pay

## 2018-11-01 ENCOUNTER — Encounter (HOSPITAL_COMMUNITY): Payer: Self-pay

## 2018-11-01 DIAGNOSIS — Z01812 Encounter for preprocedural laboratory examination: Secondary | ICD-10-CM | POA: Insufficient documentation

## 2018-11-01 LAB — COMPREHENSIVE METABOLIC PANEL
ALT: 19 U/L (ref 0–44)
AST: 17 U/L (ref 15–41)
Albumin: 4.3 g/dL (ref 3.5–5.0)
Alkaline Phosphatase: 96 U/L (ref 38–126)
Anion gap: 12 (ref 5–15)
BUN: 34 mg/dL — ABNORMAL HIGH (ref 8–23)
CO2: 25 mmol/L (ref 22–32)
Calcium: 9.7 mg/dL (ref 8.9–10.3)
Chloride: 101 mmol/L (ref 98–111)
Creatinine, Ser: 1.49 mg/dL — ABNORMAL HIGH (ref 0.61–1.24)
GFR calc Af Amer: 54 mL/min — ABNORMAL LOW (ref >60)
GFR calc non Af Amer: 47 mL/min — ABNORMAL LOW (ref >60)
Glucose, Bld: 125 mg/dL — ABNORMAL HIGH (ref 70–99)
Potassium: 4.7 mmol/L (ref 3.5–5.1)
Sodium: 138 mmol/L (ref 135–145)
Total Bilirubin: 0.5 mg/dL (ref 0.3–1.2)
Total Protein: 7.8 g/dL (ref 6.5–8.1)

## 2018-11-01 LAB — CBC WITH DIFFERENTIAL/PLATELET
Abs Immature Granulocytes: 0.04 10*3/uL (ref 0.00–0.07)
Basophils Absolute: 0.1 10*3/uL (ref 0.0–0.1)
Basophils Relative: 1 %
Eosinophils Absolute: 0.2 10*3/uL (ref 0.0–0.5)
Eosinophils Relative: 3 %
HCT: 41.1 % (ref 39.0–52.0)
Hemoglobin: 13.5 g/dL (ref 13.0–17.0)
Immature Granulocytes: 1 %
Lymphocytes Relative: 35 %
Lymphs Abs: 2.5 10*3/uL (ref 0.7–4.0)
MCH: 30.4 pg (ref 26.0–34.0)
MCHC: 32.8 g/dL (ref 30.0–36.0)
MCV: 92.6 fL (ref 80.0–100.0)
Monocytes Absolute: 0.6 10*3/uL (ref 0.1–1.0)
Monocytes Relative: 8 %
Neutro Abs: 3.8 10*3/uL (ref 1.7–7.7)
Neutrophils Relative %: 52 %
Platelets: 233 10*3/uL (ref 150–400)
RBC: 4.44 MIL/uL (ref 4.22–5.81)
RDW: 13.1 % (ref 11.5–15.5)
WBC: 7.1 10*3/uL (ref 4.0–10.5)
nRBC: 0 % (ref 0.0–0.2)

## 2018-11-01 NOTE — Pre-Procedure Instructions (Signed)
Patient has a letter from a previous intubation stating he is a difficult intubation due to arthritis of neck and  Cervical fusion. Patient cannot lift chin high or move head from side to side well. He follow you with his eyes. Dr Lemont Fillers in to assess patient and thinks it is okay for [place] to proceed.

## 2018-11-05 ENCOUNTER — Encounter (HOSPITAL_COMMUNITY): Payer: Self-pay

## 2018-11-05 ENCOUNTER — Telehealth: Payer: Self-pay | Admitting: Gastroenterology

## 2018-11-05 ENCOUNTER — Other Ambulatory Visit: Payer: Self-pay

## 2018-11-05 ENCOUNTER — Ambulatory Visit (HOSPITAL_COMMUNITY)
Admission: RE | Admit: 2018-11-05 | Discharge: 2018-11-05 | Disposition: A | Discharge status: Home / Self Care | Payer: Medicare Other | Attending: Gastroenterology | Admitting: Gastroenterology

## 2018-11-05 ENCOUNTER — Ambulatory Visit (HOSPITAL_COMMUNITY): Payer: Medicare Other | Admitting: Anesthesiology

## 2018-11-05 ENCOUNTER — Encounter (HOSPITAL_COMMUNITY)
Admission: RE | Disposition: A | Discharge status: Home / Self Care | Payer: Self-pay | Source: Home / Self Care | Attending: Gastroenterology

## 2018-11-05 DIAGNOSIS — K2980 Duodenitis without bleeding: Secondary | ICD-10-CM | POA: Diagnosis not present

## 2018-11-05 DIAGNOSIS — K2970 Gastritis, unspecified, without bleeding: Secondary | ICD-10-CM | POA: Diagnosis not present

## 2018-11-05 DIAGNOSIS — G8929 Other chronic pain: Secondary | ICD-10-CM | POA: Insufficient documentation

## 2018-11-05 DIAGNOSIS — G629 Polyneuropathy, unspecified: Secondary | ICD-10-CM | POA: Diagnosis not present

## 2018-11-05 DIAGNOSIS — G4733 Obstructive sleep apnea (adult) (pediatric): Secondary | ICD-10-CM | POA: Diagnosis not present

## 2018-11-05 DIAGNOSIS — T39395A Adverse effect of other nonsteroidal anti-inflammatory drugs [NSAID], initial encounter: Secondary | ICD-10-CM | POA: Insufficient documentation

## 2018-11-05 DIAGNOSIS — R9389 Abnormal findings on diagnostic imaging of other specified body structures: Secondary | ICD-10-CM

## 2018-11-05 DIAGNOSIS — R933 Abnormal findings on diagnostic imaging of other parts of digestive tract: Secondary | ICD-10-CM | POA: Diagnosis not present

## 2018-11-05 DIAGNOSIS — Z79899 Other long term (current) drug therapy: Secondary | ICD-10-CM | POA: Diagnosis not present

## 2018-11-05 DIAGNOSIS — K317 Polyp of stomach and duodenum: Secondary | ICD-10-CM | POA: Insufficient documentation

## 2018-11-05 DIAGNOSIS — M1990 Unspecified osteoarthritis, unspecified site: Secondary | ICD-10-CM | POA: Insufficient documentation

## 2018-11-05 DIAGNOSIS — I10 Essential (primary) hypertension: Secondary | ICD-10-CM | POA: Diagnosis not present

## 2018-11-05 HISTORY — DX: Adverse effect of unspecified anesthetic, initial encounter: T41.45XA

## 2018-11-05 HISTORY — PX: ESOPHAGOGASTRODUODENOSCOPY (EGD) WITH PROPOFOL: SHX5813

## 2018-11-05 HISTORY — PX: BIOPSY: SHX5522

## 2018-11-05 HISTORY — DX: Other complications of anesthesia, initial encounter: T88.59XA

## 2018-11-05 SURGERY — ESOPHAGOGASTRODUODENOSCOPY (EGD) WITH PROPOFOL
Anesthesia: General

## 2018-11-05 MED ORDER — NAPROXEN SODIUM 220 MG PO TABS: 220.0000 mg | ORAL_TABLET | Freq: Two times a day (BID) | ORAL | Status: DC | PRN

## 2018-11-05 MED ORDER — PANTOPRAZOLE SODIUM 40 MG PO TBEC: DELAYED_RELEASE_TABLET | ORAL | 11 refills | Status: DC

## 2018-11-05 MED ORDER — MIDAZOLAM HCL 2 MG/2ML IJ SOLN
INTRAMUSCULAR | Status: AC
  Filled 2018-11-05: qty 2

## 2018-11-05 MED ORDER — LIDOCAINE VISCOUS HCL 2 % MT SOLN
OROMUCOSAL | Status: AC
  Filled 2018-11-05: qty 15

## 2018-11-05 MED ORDER — CHLORHEXIDINE GLUCONATE CLOTH 2 % EX PADS: 6.0000 | MEDICATED_PAD | Freq: Once | CUTANEOUS | Status: DC

## 2018-11-05 MED ORDER — LACTATED RINGERS IV SOLN
INTRAVENOUS | Status: DC
  Administered 2018-11-05: 08:00:00 via INTRAVENOUS

## 2018-11-05 MED ORDER — PROPOFOL 10 MG/ML IV BOLUS
INTRAVENOUS | Status: AC
  Filled 2018-11-05: qty 40

## 2018-11-05 MED ORDER — PROMETHAZINE HCL 25 MG/ML IJ SOLN: 6.2500 mg | INTRAMUSCULAR | Status: DC | PRN

## 2018-11-05 MED ORDER — LIDOCAINE VISCOUS HCL 2 % MT SOLN
5.0000 mL | Freq: Once | OROMUCOSAL | Status: AC
  Administered 2018-11-05: 08:00:00 5 mL via OROMUCOSAL

## 2018-11-05 MED ORDER — KETAMINE HCL 50 MG/5ML IJ SOSY
PREFILLED_SYRINGE | INTRAMUSCULAR | Status: AC
  Filled 2018-11-05: qty 5

## 2018-11-05 MED ORDER — PROPOFOL 500 MG/50ML IV EMUL
INTRAVENOUS | Status: DC | PRN
  Administered 2018-11-05: 125 ug/kg/min via INTRAVENOUS

## 2018-11-05 MED ORDER — MIDAZOLAM HCL 2 MG/2ML IJ SOLN: 0.5000 mg | Freq: Once | INTRAMUSCULAR | Status: DC | PRN

## 2018-11-05 NOTE — Anesthesia Preprocedure Evaluation (Signed)
Anesthesia Evaluation  Patient identified by MRN, date of birth, ID band Patient awake    Reviewed: Allergy & Precautions, NPO status , Patient's Chart, lab work & pertinent test results  History of Anesthesia Complications (+) DIFFICULT AIRWAY and history of anesthetic complications  Airway Mallampati: IV  TM Distance: >3 FB Neck ROM: Limited    Dental no notable dental hx. (+) Teeth Intact   Pulmonary sleep apnea and Continuous Positive Airway Pressure Ventilation , pneumonia, resolved,    Pulmonary exam normal breath sounds clear to auscultation       Cardiovascular Exercise Tolerance: Poor hypertension, Pt. on medications negative cardio ROS Normal cardiovascular examII Rhythm:Regular Rate:Normal  Limited ET secondary to LBP/COPD On chronic pain patch-Buprenorphine   Neuro/Psych  Neuromuscular disease negative psych ROS   GI/Hepatic negative GI ROS, Neg liver ROS,   Endo/Other  negative endocrine ROS  Renal/GU Renal InsufficiencyRenal disease  negative genitourinary   Musculoskeletal  (+) Arthritis , Osteoarthritis,    Abdominal   Peds negative pediatric ROS (+)  Hematology negative hematology ROS (+)   Anesthesia Other Findings   Reproductive/Obstetrics negative OB ROS                             Anesthesia Physical Anesthesia Plan  ASA: III  Anesthesia Plan: General   Post-op Pain Management:    Induction: Intravenous  PONV Risk Score and Plan:   Airway Management Planned: Nasal Cannula and Simple Face Mask  Additional Equipment:   Intra-op Plan:   Post-operative Plan:   Informed Consent: I have reviewed the patients History and Physical, chart, labs and discussed the procedure including the risks, benefits and alternatives for the proposed anesthesia with the patient or authorized representative who has indicated his/her understanding and acceptance.     Dental  advisory given  Plan Discussed with: CRNA  Anesthesia Plan Comments: (Plan Full PPE use  Plan GA with GETA avail as back up, d/W Pt -AVU -WTP with same  Glidescope close by  Dr. Darrick Penna deemed this case appropriate in light of COVID concerns )        Anesthesia Quick Evaluation

## 2018-11-05 NOTE — Transfer of Care (Signed)
Immediate Anesthesia Transfer of Care Note  Patient: [name]  Procedure(s) Performed: ESOPHAGOGASTRODUODENOSCOPY (EGD) WITH PROPOFOL (N/A ) BIOPSY  Patient Location: PACU  Anesthesia Type:General  Level of Consciousness: awake and patient cooperative  Airway & Oxygen Therapy: Patient Spontanous Breathing and Patient connected to nasal cannula oxygen  Post-op Assessment: Report given to RN, Post -op Vital signs reviewed and stable and Patient moving all extremities  Post vital signs: Reviewed and stable  Last Vitals:  Vitals Value Taken Time  BP    Temp    Pulse 60 11/05/2018  9:03 AM  Resp 11 11/05/2018  9:03 AM  SpO2 100 % 11/05/2018  9:03 AM  Vitals shown include unvalidated device data.  Last Pain:  Vitals:   11/05/18 0729  TempSrc: Oral  PainSc:          Complications: No apparent anesthesia complications

## 2018-11-05 NOTE — Telephone Encounter (Signed)
Routing to triage nurse.

## 2018-11-05 NOTE — Anesthesia Postprocedure Evaluation (Signed)
Anesthesia Post Note  Patient: [name]  Procedure(s) Performed: ESOPHAGOGASTRODUODENOSCOPY (EGD) WITH PROPOFOL (N/A ) BIOPSY  Patient location during evaluation: PACU Anesthesia Type: General Level of consciousness: awake and alert Pain management: pain level controlled Vital Signs Assessment: post-procedure vital signs reviewed and stable Respiratory status: spontaneous breathing, nonlabored ventilation and respiratory function stable Cardiovascular status: blood pressure returned to baseline Postop Assessment: no apparent nausea or vomiting Anesthetic complications: no     Last Vitals:  Vitals:   11/05/18 0729 11/05/18 0905  BP:  110/66  Pulse:  (!) 57  Resp:  15  Temp: 36.4 C (P) 36.6 C  SpO2:  100%    Last Pain:  Vitals:   11/05/18 0729  TempSrc: Oral  PainSc:                  [name] J

## 2018-11-05 NOTE — Op Note (Signed)
[facility] Patient Name: [name] Procedure Date: 11/05/2018 8:12 AM MRN: [id number] Date of Birth: 1945/07/09 Attending MD: Jonette Eva MD, MD CSN: [id number] Age: 73 Admit Type: Outpatient Procedure:                Upper GI endoscopy WITH COLD FORCEPS BIOPSY Indications:              Abnormal CT of the GI tract Providers:                Jonette Eva MD, MD, Jannett Celestine, RN, Edythe Clarity,                            Technician, Pandora Leiter, Technician Referring MD:             Meredith Mody. Corrington, MD Medicines:                Propofol per Anesthesia Complications:            No immediate complications. Estimated Blood Loss:     Estimated blood loss was minimal. Procedure:                Pre-Anesthesia Assessment:                           - Prior to the procedure, a History and Physical                            was performed, and patient medications and                            allergies were reviewed. The patient's tolerance of                            previous anesthesia was also reviewed. The risks                            and benefits of the procedure and the sedation                            options and risks were discussed with the patient.                            All questions were answered, and informed consent                            was obtained. Prior Anticoagulants: The patient has                            taken no previous anticoagulant or antiplatelet                            agents except for NSAID medication. ASA Grade                            Assessment: II - A patient with mild systemic  disease. After reviewing the risks and benefits,                            the patient was deemed in satisfactory condition to                            undergo the procedure. After obtaining informed                            consent, the endoscope was passed under direct                            vision. Throughout  the procedure, the patient's                            blood pressure, pulse, and oxygen saturations were                            monitored continuously. The GIF-[id number] ([id number]                            scope was introduced through the mouth, and                            advanced to the second part of duodenum. The upper                            GI endoscopy was accomplished without difficulty.                            The patient tolerated the procedure well. Scope In: 8:47:18 AM Scope Out: 8:54:12 AM Total Procedure Duration: 0 hours 6 minutes 54 seconds  Findings:      The examined esophagus was normal.      Localized moderate inflammation characterized by congestion (edema),       erosions and erythema was found in the gastric body and in the gastric       antrum. Biopsies(2; BODY, 1: INCISURA, 2: ANTRUM) were taken with a cold       forceps for Helicobacter pylori testing.      A few small sessile polyps with no stigmata of recent bleeding were       found in the gastric body. The polyp was removed with a cold biopsy       forceps. Resection and retrieval were complete.      Localized mild inflammation characterized by congestion (edema) and       erythema was found in the duodenal bulb.      The second portion of the duodenum was normal. Impression:               - CT FINDING DUE TO NSAID DUODENITIS.                           - MODERATE NSAID Gastritis. Moderate Sedation:      Per Anesthesia Care Recommendation:           - Patient has a contact number  available for                            emergencies. The signs and symptoms of potential                            delayed complications were discussed with the                            patient. Return to normal activities tomorrow.                            Written discharge instructions were provided to the                            patient.                           - Low fat diet.                           -  Continue present medications.                           - Await pathology results.                           - Return to if needed AFTER PATH RESULTED.                           - NEXT COLONOSCOPY IN AUG 2021. Procedure Code(s):        --- Professional ---                           [phone], Esophagogastroduodenoscopy, flexible,                            transoral; with biopsy, single or multiple Diagnosis Code(s):        --- Professional ---                           K29.70, Gastritis, unspecified, without bleeding                           K31.7, Polyp of stomach and duodenum                           K29.80, Duodenitis without bleeding                           R93.3, Abnormal findings on diagnostic imaging of                            other parts of digestive tract CPT copyright 2019 American Medical Association. All rights reserved. The codes documented in this report are preliminary and upon coder review may  be revised to meet current compliance requirements. Jonette Eva, MD Jonette Eva  MD, MD 11/05/2018 10:16:00 AM This report has been signed electronically. Number of Addenda: 0

## 2018-11-05 NOTE — Discharge Instructions (Signed)
You have gastritis & DUODENITIS DUE TO YOUR NAPROXEN. I biopsied your stomach.   DRINK WATER TO KEEP YOUR URINE LIGHT YELLOW.  FOLLOW A LOW FAT DIET. MEATS SHOULD BE BAKED, BROILED, OR BOILED. AVOID FRIED FOODS.  TO PREVENT ULCERS AND TO TREAT GASTRITIS/DUODENITIS DUE TO NAPROXEN, CONTINUE PROTONIX. TAKE 30 MINUTES PRIOR TO BREAKFAST.  NAPROXEN SIDE EFFECTS INCLUDE GASTRITIS, ULCERS, INTERNAL BLEEDING, AND KIDNEY FAILURE. YOU SHOULD ONLY TAKE ONE TWICE DAILY WITH FOOD OR MILK WHEN NEEDED FOR PAIN  YOUR BIOPSY RESULTS WILL BE BACK IN 5 BUSINESS DAYS.  FOLLOW UP IN THE OFFICE WILL BE SCHEDULED IF NEEDED BIOPSY RESULTS ARE AVAILABLE.  I REVIEWED YOUR COLONOSCOPY REPORT  AND IT WAS DATED AUG 2011. YOU NEED ANOTHER COLONOSCOPY IN AUG 2021.   UPPER ENDOSCOPY AFTER CARE Read the instructions outlined below and refer to this sheet in the next week. These discharge instructions provide you with general information on caring for yourself after you leave the hospital. While your treatment has been planned according to the most current medical practices available, unavoidable complications occasionally occur. If you have any problems or questions after discharge, call DR. Yatzari Jonsson, [phone].  ACTIVITY  You may resume your regular activity, but move at a slower pace for the next 24 hours.   Take frequent rest periods for the next 24 hours.   Walking will help get rid of the air and reduce the bloated feeling in your belly (abdomen).   No driving for 24 hours (because of the medicine (anesthesia) used during the test).   You may shower.   Do not sign any important legal documents or operate any machinery for 24 hours (because of the anesthesia used during the test).    NUTRITION  Drink plenty of fluids.   You may resume your normal diet as instructed by your doctor.   Begin with a light meal and progress to your normal diet. Heavy or fried foods are harder to digest and may make you  feel sick to your stomach (nauseated).   Avoid alcoholic beverages for 24 hours or as instructed.    MEDICATIONS  You may resume your normal medications.   WHAT YOU CAN EXPECT TODAY  Some feelings of bloating in the abdomen.   Passage of more gas than usual.    IF YOU HAD A BIOPSY TAKEN DURING THE UPPER ENDOSCOPY:  Eat a soft diet IF YOU HAVE NAUSEA, BLOATING, ABDOMINAL PAIN, OR VOMITING.    FINDING OUT THE RESULTS OF YOUR TEST Not all test results are available during your visit. DR. Darrick Penna WILL CALL YOU WITHIN 7 DAYS OF YOUR PROCEDUE WITH YOUR RESULTS. Do not assume everything is normal if you have not heard from DR. Renatta Shrieves IN ONE WEEK, CALL HER OFFICE AT [phone].  SEEK IMMEDIATE MEDICAL ATTENTION AND CALL THE OFFICE: [phone] IF:  You have more than a spotting of blood in your stool.   Your belly is swollen (abdominal distention).   You are nauseated or vomiting.   You have a temperature over 101F.   You have abdominal pain or discomfort that is severe or gets worse throughout the day.   Gastritis/DUODENITIS  Gastritis is an inflammation (the body's way of reacting to injury and/or infection) of the stomach. DUODENITIS is an inflammation (the body's way of reacting to injury and/or infection) of the FIRST PART OF THE SMALL INTESTINES. It is often caused by bacterial (germ) infections. It can also be caused BY ASPIRIN, BC/GOODY POWDER'S, (IBUPROFEN) MOTRIN, OR ALEVE (  NAPROXEN), chemicals (including alcohol), SPICY FOODS, and medications. This illness may be associated with generalized malaise (feeling tired, not well), UPPER ABDOMINAL STOMACH cramps, and fever. One common bacterial cause of gastritis is an organism known as H. Pylori. This can be treated with antibiotics.     REFLUX   TREATMENT There are a number of non-prescription medicines used to treat reflux including: Antacids.  ZANTAC Proton-pump inhibitors: PRILOSEC OR NEXIUM  HOME CARE  INSTRUCTIONS Eat 2-3 hours before going to bed.  Try to reach and maintain a healthy weight. LOSE 10-20 LBS Do not eat just a few very large meals. Instead, eat 4 TO 6 smaller meals throughout the day.  Try to identify foods and beverages that make your symptoms worse, and avoid these.  Avoid tight clothing.  Do not exercise right after eating.   Low-Fat Diet BREADS, CEREALS, PASTA, RICE, DRIED PEAS, AND BEANS These products are high in carbohydrates and most are low in fat. Therefore, they can be increased in the diet as substitutes for fatty foods. They too, however, contain calories and should not be eaten in excess. Cereals can be eaten for snacks as well as for breakfast.  Include foods that contain fiber (fruits, vegetables, whole grains, and legumes). Research shows that fiber may lower blood cholesterol levels, especially the water-soluble fiber found in fruits, vegetables, oat products, and legumes. FRUITS AND VEGETABLES It is good to eat fruits and vegetables. Besides being sources of fiber, both are rich in vitamins and some minerals. They help you get the daily allowances of these nutrients. Fruits and vegetables can be used for snacks and desserts. MEATS Limit lean meat, chicken, [place], and fish to no more than 6 ounces per day. Beef, Pork, and Lamb Use lean cuts of beef, pork, and lamb. Lean cuts include:  Extra-lean ground beef.  Arm roast.  Sirloin tip.  Center-cut ham.  Round steak.  Loin chops.  Rump roast.  Tenderloin.  Trim all fat off the outside of meats before cooking. It is not necessary to severely decrease the intake of red meat, but lean choices should be made. Lean meat is rich in protein and contains a highly absorbable form of iron. Premenopausal women, in particular, should avoid reducing lean red meat because this could increase the risk for low red blood cells (iron-deficiency anemia).  Chicken and [place] These are good sources of protein. The fat of  poultry can be reduced by removing the skin and underlying fat layers before cooking. Chicken and [place] can be substituted for lean red meat in the diet. Poultry should not be fried or covered with high-fat sauces. Fish and Shellfish Fish is a good source of protein. Shellfish contain cholesterol, but they usually are low in saturated fatty acids. The preparation of fish is important. Like chicken and [place], they should not be fried or covered with high-fat sauces. EGGS Egg whites contain no fat or cholesterol. They can be eaten often. Try 1 to 2 egg whites instead of whole eggs in recipes or use egg substitutes that do not contain yolk.  MILK AND DAIRY PRODUCTS Use skim or 1% milk instead of 2% or whole milk. Decrease whole milk, natural, and processed cheeses. Use nonfat or low-fat (2%) cottage cheese or low-fat cheeses made from vegetable oils. Choose nonfat or low-fat (1 to 2%) yogurt. Experiment with evaporated skim milk in recipes that call for heavy cream. Substitute low-fat yogurt or low-fat cottage cheese for sour cream in dips and salad dressings. Have  at least 2 servings of low-fat dairy products, such as 2 glasses of skim (or 1%) milk each day to help get your daily calcium intake.  FATS AND OILS Butterfat, lard, and beef fats are high in saturated fat and cholesterol. These should be avoided.Vegetable fats do not contain cholesterol. AVOID coconut oil, palm oil, and palm kernel oil, WHICH are very high in saturated fats. These should be limited. These fats are often used in bakery goods, processed foods, popcorn, oils, and nondairy creamers. Vegetable shortenings and some peanut butters contain hydrogenated oils, which are also saturated fats. Read the labels on these foods and check for saturated vegetable oils.  Desirable liquid vegetable oils are corn oil, cottonseed oil, olive oil, canola oil, safflower oil, soybean oil, and sunflower oil. Peanut oil is not as good, but small amounts  are acceptable. Buy a heart-healthy tub margarine that has no partially hydrogenated oils in the ingredients. AVOID Mayonnaise and salad dressings often are made from unsaturated fats.  OTHER EATING TIPS Snacks  Most sweets should be limited as snacks. They tend to be rich in calories and fats, and their caloric content outweighs their nutritional value. Some good choices in snacks are graham crackers, melba toast, soda crackers, bagels (no egg), English muffins, fruits, and vegetables. These snacks are preferable to snack crackers, [place] fries, and chips. Popcorn should be air-popped or cooked in small amounts of liquid vegetable oil.  Desserts Eat fruit, low-fat yogurt, and fruit ices instead of pastries, cake, and cookies. Sherbet, angel food cake, gelatin dessert, frozen low-fat yogurt, or other frozen products that do not contain saturated fat (pure fruit juice bars, frozen ice pops) are also acceptable.   COOKING METHODS Choose those methods that use little or no fat. They include: Poaching.  Braising.  Steaming.  Grilling.  Baking.  Stir-frying.  Broiling.  Microwaving.  Foods can be cooked in a nonstick pan without added fat, or use a nonfat cooking spray in regular cookware. Limit fried foods and avoid frying in saturated fat. Add moisture to lean meats by using water, broth, cooking wines, and other nonfat or low-fat sauces along with the cooking methods mentioned above. Soups and stews should be chilled after cooking. The fat that forms on top after a few hours in the refrigerator should be skimmed off. When preparing meals, avoid using excess salt. Salt can contribute to raising blood pressure in some people.  EATING AWAY FROM HOME Order entres, potatoes, and vegetables without sauces or butter. When meat exceeds the size of a deck of cards (3 to 4 ounces), the rest can be taken home for another meal. Choose vegetable or fruit salads and ask for low-calorie salad dressings to be  served on the side. Use dressings sparingly. Limit high-fat toppings, such as bacon, crumbled eggs, cheese, sunflower seeds, and olives. Ask for heart-healthy tub margarine instead of butter.

## 2018-11-05 NOTE — Telephone Encounter (Signed)
TRIAGE FOR TCS W/ MAC IN AUG 2021, DX; SCREENING.

## 2018-11-05 NOTE — H&P (Addendum)
Primary Care Physician:  Vivien Prestoorrington, Kip A, MD Primary Gastroenterologist:  Dr. Darrick PennaFields  Pre-Procedure History & Physical: HPI:  [name] is a 73 y.o. male here for ABNORMAL CT: DUODENUM.  Past Medical History:  Diagnosis Date  . 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 Ankylosing spondylitis of multiple sites in spine (HCC)   . Chronic pain   . Chronically on opiate therapy    Pain Management d/c opiates 06/2018  . 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 pt has note from [facility] indicating difficulty  . First degree AV block    chronic  . Gait instability   . Hypertension   . Multiple falls   . OSA (obstructive sleep apnea)   . Pain management   . Peripheral axonal neuropathy   . Peripheral neuropathy 06/06/2018  . Tremor     Past Surgical History:  Procedure Laterality Date  . ANKLE SURGERY Right    orif  . BACK SURGERY    . CHOLECYSTECTOMY    . JOINT REPLACEMENT Bilateral    knees  . NECK SURGERY     cervical fusion  . SPINAL CORD STIMULATOR INSERTION  2017  . TOTAL KNEE ARTHROPLASTY      Prior to Admission medications   Medication Sig Start Date End Date Taking? Authorizing Provider  amLODipine (NORVASC) 10 MG tablet Take 10 mg by mouth every evening.    Yes [provider]  cetirizine (ZYRTEC) 10 MG tablet Take 10 mg by mouth daily as needed for allergies.   Yes [provider]  DULoxetine (CYMBALTA) 30 MG capsule Take 60 mg by mouth daily.   Yes [provider]  losartan (COZAAR) 100 MG tablet Take 100 mg by mouth every evening.   Yes [provider]  naproxen sodium (ALEVE) 220 MG tablet Take 220-440 mg by mouth every 8 (eight) hours as needed (pain).   Yes [provider]  pregabalin (LYRICA) 150 MG capsule Take 150 mg by mouth 3 (three) times daily.   Yes [provider]  tamsulosin (FLOMAX) 0.4 MG CAPS capsule Take 1  capsule (0.4 mg total) by mouth daily after supper. Patient taking differently: Take 0.4 mg by mouth at bedtime.  05/14/18  Yes Purohit, Salli QuarryShrey C, MD  Buprenorphine HCl (BELBUCA) 75 MCG FILM Place 75 mcg inside cheek 2 (two) times daily.    [provider]  dicyclomine (BENTYL) 10 MG capsule Take 1 capsule (10 mg total) by mouth 2 (two) times daily. Patient not taking: Reported on 09/17/2018 07/31/18   Erick BlinksMemon, Jehanzeb, MD  diphenoxylate-atropine (LOMOTIL) 2.5-0.025 MG tablet Take 2 tablets by mouth 2 (two) times daily. Patient not taking: Reported on 09/17/2018 07/31/18   Erick BlinksMemon, Jehanzeb, MD  hydrALAZINE (APRESOLINE) 50 MG tablet Take 1 tablet (50 mg total) by mouth every 8 (eight) hours. Patient not taking: Reported on 09/17/2018 07/31/18   Erick BlinksMemon, Jehanzeb, MD  methocarbamol (ROBAXIN) 500 MG tablet Take 1 tablet (500 mg total) by mouth every 6 (six) hours as needed for muscle spasms. Patient not taking: Reported on 09/17/2018 07/31/18   Erick BlinksMemon, Jehanzeb, MD  pantoprazole (PROTONIX) 40 MG tablet Take 1 tablet (40 mg total) by mouth daily. Patient not taking: Reported on 09/17/2018 07/31/18   Erick BlinksMemon, Jehanzeb, MD  saccharomyces boulardii (FLORASTOR) 250 MG capsule Take 1 capsule (250 mg total) by mouth 2 (two) times daily. Patient not taking: Reported on 09/17/2018 07/31/18  Erick Blinks, MD  traMADol (ULTRAM) 50 MG tablet Take 1 tablet (50 mg total) by mouth every 6 (six) hours as needed. Patient not taking: Reported on 09/17/2018 07/31/18 07/31/19  Erick Blinks, MD    Allergies as of 08/01/2018 - Review Complete 07/25/2018  Allergen Reaction Noted  . Codeine Nausea Only     Family History  Problem Relation Age of Onset  . Heart disease Mother     Social History   Socioeconomic History  . Marital status: Married    Spouse name: Not on file  . Number of children: Not on file  . Years of education: Not on file  . Highest education level: Not on file  Occupational History  . Not on  file  Social Needs  . Financial resource strain: Not on file  . Food insecurity:    Worry: Not on file    Inability: Not on file  . Transportation needs:    Medical: Not on file    Non-medical: Not on file  Tobacco Use  . Smoking status: Never Smoker  . Smokeless tobacco: Never Used  Substance and Sexual Activity  . Alcohol use: Not Currently  . Drug use: No  . Sexual activity: Yes    Birth control/protection: None  Lifestyle  . Physical activity:    Days per week: Not on file    Minutes per session: Not on file  . Stress: Not on file  Relationships  . Social connections:    Talks on phone: Not on file    Gets together: Not on file    Attends religious service: Not on file    Active member of club or organization: Not on file    Attends meetings of clubs or organizations: Not on file    Relationship status: Not on file  . Intimate partner violence:    Fear of current or ex partner: Not on file    Emotionally abused: Not on file    Physically abused: Not on file    Forced sexual activity: Not on file  Other Topics Concern  . Not on file  Social History Narrative  . Not on file    Review of Systems: See HPI, otherwise negative ROS   Physical Exam: BP (!) 161/79   Pulse (!) 58   Temp 97.6 F (36.4 C) (Oral)   Resp 12   Ht 6' (1.829 m)   Wt 108.9 kg   SpO2 98%   BMI 32.55 kg/m  General:   Alert,  pleasant and cooperative in NAD Head:  Normocephalic and atraumatic. Neck:  Supple; Lungs:  Clear throughout to auscultation.    Heart:  Regular rate and rhythm. Abdomen:  Soft, nontender and nondistended. Normal bowel sounds, without guarding, and without rebound.   Neurologic:  Alert and  oriented x4;  grossly normal neurologically.  Impression/Plan:     Abnormal CT scan: DUODENUM  Plan:  EGD TODAY.  DISCUSSED PROCEDURE, BENEFITS, & RISKS: < 1% chance of medication reaction, bleeding, perforation, or ASPIRATION.

## 2018-11-06 ENCOUNTER — Telehealth: Payer: Self-pay | Admitting: Gastroenterology

## 2018-11-06 NOTE — Telephone Encounter (Signed)
Please call pt. His stomach Bx shows gastritis due to ALEVE.   DRINK WATER TO KEEP YOUR URINE LIGHT YELLOW. FOLLOW A LOW FAT DIET. MEATS SHOULD BE BAKED, BROILED, OR BOILED. AVOID FRIED FOODS. TO PREVENT ULCERS AND TO TREAT GASTRITIS/DUODENITIS DUE TO NAPROXEN, CONTINUE PROTONIX. TAKE 30 MINUTES PRIOR TO BREAKFAST. YOU SHOULD ONLY TAKE ONE TWICE DAILY WITH FOOD OR MILK WHEN NEEDED FOR PAIN NEXT COLONOSCOPY IN AUG 2021.

## 2018-11-07 ENCOUNTER — Encounter (HOSPITAL_COMMUNITY): Payer: Self-pay | Admitting: Gastroenterology

## 2018-11-07 NOTE — Telephone Encounter (Signed)
Reminder in epic °

## 2018-11-07 NOTE — Telephone Encounter (Signed)
Lmom, waiting on a return call.  

## 2018-11-07 NOTE — Telephone Encounter (Addendum)
Spoke to Dr. Darrick Penna.  Pt will need a nurse visit to update information.  Please NIC for June 2021.

## 2018-11-08 ENCOUNTER — Other Ambulatory Visit (HOSPITAL_COMMUNITY): Payer: Self-pay

## 2018-11-08 NOTE — Telephone Encounter (Signed)
Pt notified of results. Please NIC TCS for 02/2020

## 2018-11-08 NOTE — Telephone Encounter (Signed)
On recall  °

## 2019-09-27 ENCOUNTER — Encounter (HOSPITAL_COMMUNITY): Payer: Self-pay | Admitting: *Deleted

## 2019-09-27 ENCOUNTER — Other Ambulatory Visit: Payer: Self-pay

## 2019-09-27 ENCOUNTER — Observation Stay (HOSPITAL_COMMUNITY)
Admission: EM | Admit: 2019-09-27 | Discharge: 2019-09-29 | Disposition: A | Discharge status: Home / Self Care | Payer: Medicare Other | Attending: Family Medicine | Admitting: Family Medicine

## 2019-09-27 ENCOUNTER — Emergency Department (HOSPITAL_COMMUNITY): Payer: Medicare Other

## 2019-09-27 DIAGNOSIS — F1010 Alcohol abuse, uncomplicated: Secondary | ICD-10-CM | POA: Diagnosis not present

## 2019-09-27 DIAGNOSIS — M459 Ankylosing spondylitis of unspecified sites in spine: Secondary | ICD-10-CM | POA: Diagnosis present

## 2019-09-27 DIAGNOSIS — Z20822 Contact with and (suspected) exposure to covid-19: Secondary | ICD-10-CM | POA: Diagnosis not present

## 2019-09-27 DIAGNOSIS — Z9181 History of falling: Secondary | ICD-10-CM | POA: Insufficient documentation

## 2019-09-27 DIAGNOSIS — Z885 Allergy status to narcotic agent status: Secondary | ICD-10-CM | POA: Diagnosis not present

## 2019-09-27 DIAGNOSIS — R296 Repeated falls: Secondary | ICD-10-CM

## 2019-09-27 DIAGNOSIS — Z79899 Other long term (current) drug therapy: Secondary | ICD-10-CM | POA: Insufficient documentation

## 2019-09-27 DIAGNOSIS — G894 Chronic pain syndrome: Secondary | ICD-10-CM | POA: Diagnosis present

## 2019-09-27 DIAGNOSIS — I10 Essential (primary) hypertension: Secondary | ICD-10-CM | POA: Diagnosis present

## 2019-09-27 DIAGNOSIS — R531 Weakness: Secondary | ICD-10-CM

## 2019-09-27 DIAGNOSIS — G459 Transient cerebral ischemic attack, unspecified: Secondary | ICD-10-CM

## 2019-09-27 DIAGNOSIS — R270 Ataxia, unspecified: Principal | ICD-10-CM

## 2019-09-27 DIAGNOSIS — G603 Idiopathic progressive neuropathy: Secondary | ICD-10-CM

## 2019-09-27 DIAGNOSIS — M450 Ankylosing spondylitis of multiple sites in spine: Secondary | ICD-10-CM | POA: Insufficient documentation

## 2019-09-27 DIAGNOSIS — G9009 Other idiopathic peripheral autonomic neuropathy: Secondary | ICD-10-CM | POA: Insufficient documentation

## 2019-09-27 DIAGNOSIS — R4182 Altered mental status, unspecified: Secondary | ICD-10-CM

## 2019-09-27 DIAGNOSIS — G4733 Obstructive sleep apnea (adult) (pediatric): Secondary | ICD-10-CM | POA: Diagnosis not present

## 2019-09-27 DIAGNOSIS — G629 Polyneuropathy, unspecified: Secondary | ICD-10-CM

## 2019-09-27 LAB — DIFFERENTIAL
Abs Immature Granulocytes: 0.02 10*3/uL (ref 0.00–0.07)
Basophils Absolute: 0.1 10*3/uL (ref 0.0–0.1)
Basophils Relative: 1 %
Eosinophils Absolute: 0.1 10*3/uL (ref 0.0–0.5)
Eosinophils Relative: 1 %
Immature Granulocytes: 0 %
Lymphocytes Relative: 23 %
Lymphs Abs: 1.4 10*3/uL (ref 0.7–4.0)
Monocytes Absolute: 0.3 10*3/uL (ref 0.1–1.0)
Monocytes Relative: 6 %
Neutro Abs: 4.1 10*3/uL (ref 1.7–7.7)
Neutrophils Relative %: 69 %

## 2019-09-27 LAB — CBC
HCT: 39.8 % (ref 39.0–52.0)
Hemoglobin: 13.3 g/dL (ref 13.0–17.0)
MCH: 31.9 pg (ref 26.0–34.0)
MCHC: 33.4 g/dL (ref 30.0–36.0)
MCV: 95.4 fL (ref 80.0–100.0)
Platelets: 241 10*3/uL (ref 150–400)
RBC: 4.17 MIL/uL — ABNORMAL LOW (ref 4.22–5.81)
RDW: 12.3 % (ref 11.5–15.5)
WBC: 5.9 10*3/uL (ref 4.0–10.5)
nRBC: 0 % (ref 0.0–0.2)

## 2019-09-27 LAB — COMPREHENSIVE METABOLIC PANEL
ALT: 42 U/L (ref 0–44)
AST: 27 U/L (ref 15–41)
Albumin: 4.2 g/dL (ref 3.5–5.0)
Alkaline Phosphatase: 121 U/L (ref 38–126)
Anion gap: 9 (ref 5–15)
BUN: 31 mg/dL — ABNORMAL HIGH (ref 8–23)
CO2: 23 mmol/L (ref 22–32)
Calcium: 9.5 mg/dL (ref 8.9–10.3)
Chloride: 111 mmol/L (ref 98–111)
Creatinine, Ser: 1.76 mg/dL — ABNORMAL HIGH (ref 0.61–1.24)
GFR calc Af Amer: 44 mL/min — ABNORMAL LOW (ref >60)
GFR calc non Af Amer: 38 mL/min — ABNORMAL LOW (ref >60)
Glucose, Bld: 112 mg/dL — ABNORMAL HIGH (ref 70–99)
Potassium: 4.8 mmol/L (ref 3.5–5.1)
Sodium: 143 mmol/L (ref 135–145)
Total Bilirubin: 0.7 mg/dL (ref 0.3–1.2)
Total Protein: 7.9 g/dL (ref 6.5–8.1)

## 2019-09-27 LAB — PROTIME-INR
INR: 1.1 (ref 0.8–1.2)
Prothrombin Time: 13.9 seconds (ref 11.4–15.2)

## 2019-09-27 LAB — ETHANOL: Alcohol, Ethyl (B): <10 mg/dL (ref <10)

## 2019-09-27 LAB — APTT: aPTT: 35 seconds (ref 24–36)

## 2019-09-27 MED ORDER — DULOXETINE HCL 60 MG PO CPEP
60.0000 mg | ORAL_CAPSULE | Freq: Every day | ORAL | Status: DC
  Administered 2019-09-28 – 2019-09-29 (×2): 60 mg via ORAL
  Filled 2019-09-27 (×2): qty 1

## 2019-09-27 MED ORDER — NAPROXEN SODIUM 275 MG PO TABS
275.0000 mg | ORAL_TABLET | Freq: Two times a day (BID) | ORAL | Status: DC | PRN
  Filled 2019-09-27: qty 1

## 2019-09-27 MED ORDER — TAMSULOSIN HCL 0.4 MG PO CAPS
0.4000 mg | ORAL_CAPSULE | Freq: Every day | ORAL | Status: DC
  Administered 2019-09-28 (×2): 0.4 mg via ORAL
  Filled 2019-09-27 (×2): qty 1

## 2019-09-27 MED ORDER — SENNOSIDES-DOCUSATE SODIUM 8.6-50 MG PO TABS: 1.0000 | ORAL_TABLET | Freq: Every evening | ORAL | Status: DC | PRN

## 2019-09-27 MED ORDER — ENOXAPARIN SODIUM 40 MG/0.4ML ~~LOC~~ SOLN
40.0000 mg | SUBCUTANEOUS | Status: DC
  Administered 2019-09-28 – 2019-09-29 (×2): 40 mg via SUBCUTANEOUS
  Filled 2019-09-27 (×2): qty 0.4

## 2019-09-27 MED ORDER — STROKE: EARLY STAGES OF RECOVERY BOOK: Freq: Once | Status: AC

## 2019-09-27 MED ORDER — OXYCODONE HCL 5 MG PO TABS
5.0000 mg | ORAL_TABLET | Freq: Four times a day (QID) | ORAL | Status: DC | PRN
  Administered 2019-09-28 (×3): 5 mg via ORAL
  Filled 2019-09-27 (×4): qty 1

## 2019-09-27 MED ORDER — OXYCODONE-ACETAMINOPHEN 5-325 MG PO TABS
2.0000 | ORAL_TABLET | Freq: Once | ORAL | Status: AC
  Administered 2019-09-27: 2 via ORAL
  Filled 2019-09-27: qty 2

## 2019-09-27 MED ORDER — PANTOPRAZOLE SODIUM 40 MG PO TBEC
40.0000 mg | DELAYED_RELEASE_TABLET | Freq: Every day | ORAL | Status: DC
  Administered 2019-09-28 – 2019-09-29 (×2): 40 mg via ORAL
  Filled 2019-09-27 (×2): qty 1

## 2019-09-27 MED ORDER — LORATADINE 10 MG PO TABS
10.0000 mg | ORAL_TABLET | Freq: Every day | ORAL | Status: DC
  Administered 2019-09-28: 10 mg via ORAL
  Filled 2019-09-27 (×2): qty 1

## 2019-09-27 MED ORDER — ACETAMINOPHEN 650 MG RE SUPP: 650.0000 mg | RECTAL | Status: DC | PRN

## 2019-09-27 MED ORDER — OXYCODONE-ACETAMINOPHEN 10-325 MG PO TABS: 1.0000 | ORAL_TABLET | Freq: Four times a day (QID) | ORAL | Status: DC | PRN

## 2019-09-27 MED ORDER — ACETAMINOPHEN 325 MG PO TABS: 650.0000 mg | ORAL_TABLET | ORAL | Status: DC | PRN

## 2019-09-27 MED ORDER — PREGABALIN 75 MG PO CAPS
150.0000 mg | ORAL_CAPSULE | Freq: Three times a day (TID) | ORAL | Status: DC
  Administered 2019-09-28 – 2019-09-29 (×5): 150 mg via ORAL
  Filled 2019-09-27 (×5): qty 2

## 2019-09-27 MED ORDER — MELOXICAM 7.5 MG PO TABS
7.5000 mg | ORAL_TABLET | Freq: Every day | ORAL | Status: DC
  Administered 2019-09-28 – 2019-09-29 (×2): 7.5 mg via ORAL
  Filled 2019-09-27 (×2): qty 1

## 2019-09-27 MED ORDER — ACETAMINOPHEN 160 MG/5ML PO SOLN: 650.0000 mg | ORAL | Status: DC | PRN

## 2019-09-27 MED ORDER — OXYCODONE-ACETAMINOPHEN 5-325 MG PO TABS
1.0000 | ORAL_TABLET | Freq: Four times a day (QID) | ORAL | Status: DC | PRN
  Administered 2019-09-28 – 2019-09-29 (×4): 1 via ORAL
  Filled 2019-09-27 (×5): qty 1

## 2019-09-27 NOTE — H&P (Signed)
History and Physical  CARLETON VANVALKENBURGH IRW:[id number] DOB: 11/26/1945 DOA: 09/27/2019  Referring physician: Dr Lacinda Axon, ED physician PCP: Curly Rim, MD  Outpatient Specialists:   Patient Coming From: home  Chief Complaint: AMS, slurred speech  HPI: REGINAL WOJCICKI is a 74 y.o. male with a history of ankylosing spondylitis, chronic pain, peripheral axonal neuropathy, gait instability with multiple falls, hypertension remote history of alcohol abuse.  He is managed by Dr. Merlene Laughter for chronic pain management.  Patient brought to the hospital due to concerns of altered mental status with slurred speech and right leg weakness.  Last seen well was last night.  Patient's wife woke him this morning around 1030, however he was not particularly responsive and coherent.  When she returned at 115, she noticed that he had not been out of bed.  When she woke him up, she noted that he was confused, had difficulty speaking and had weakness in his arms bilaterally.  EMS was called.  They noted no slurred speech, however they found that he was weak in his arms and right leg.  No fevers, chills, nausea, vomiting.  Of note, the patient recently started baclofen by [facility] yesterday and had his first dose last night  Emergency Department Course: CT of the head showed stable chronic small vessel changes with cortical atrophy, however no true cranial process.  Neurology was consulted and observation was recommended with repeat CT scan as the patient has a spinal cord stimulator  Review of Systems:   Pt denies any fevers, chills, nausea, vomiting, diarrhea, constipation, abdominal pain, shortness of breath, dyspnea on exertion, orthopnea, cough, wheezing, palpitations, headache, vision changes, lightheadedness, dizziness, melena, rectal bleeding.  Review of systems are otherwise negative  Past Medical History:  Diagnosis Date  . 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 Ankylosing spondylitis  of multiple sites in spine ([place])   . Chronic pain   . Chronically on opiate therapy    Pain Management d/c opiates 06/2018  . 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 pt has note from [facility] indicating difficulty  . First degree AV block    chronic  . Gait instability   . Hypertension   . Multiple falls   . OSA (obstructive sleep apnea)   . Pain management   . Peripheral axonal neuropathy   . Peripheral neuropathy 06/06/2018  . Tremor    Past Surgical History:  Procedure Laterality Date  . ANKLE SURGERY Right    orif  . BACK SURGERY    . BIOPSY  11/05/2018   Procedure: BIOPSY;  Surgeon: Danie Binder, MD;  Location: [facility];  Service: Endoscopy;;  . CHOLECYSTECTOMY    . ESOPHAGOGASTRODUODENOSCOPY (EGD) WITH PROPOFOL N/A 11/05/2018   Procedure: ESOPHAGOGASTRODUODENOSCOPY (EGD) WITH PROPOFOL;  Surgeon: Danie Binder, MD;  Location: [facility];  Service: Endoscopy;  Laterality: N/A;  10:30am-rescheduled to 4/7 @ 7:30am per office  . JOINT REPLACEMENT Bilateral    knees  . NECK SURGERY     cervical fusion  . SPINAL CORD STIMULATOR INSERTION  2017  . TOTAL KNEE ARTHROPLASTY     Social History:  reports that he has never smoked. He has never used smokeless tobacco. He reports previous alcohol use. He reports that he does not use drugs.   Allergies  Allergen Reactions  . Codeine Nausea Only    Family History  Problem Relation Age of  Onset  . Heart disease Mother       Prior to Admission medications   Medication Sig Start Date End Date Taking? Authorizing Provider  amLODipine (NORVASC) 10 MG tablet Take 10 mg by mouth every evening.    Yes [provider]  baclofen (LIORESAL) 10 MG tablet Take 10 mg by mouth 3 (three) times daily. 09/26/19  Yes [provider]  cetirizine (ZYRTEC) 10 MG tablet Take 10 mg by mouth daily as needed for allergies.   Yes [provider]  cyclobenzaprine (FLEXERIL) 5 MG tablet Take 5 mg by mouth 3 (three) times daily as needed. 09/12/19  Yes [provider]  DULoxetine (CYMBALTA) 30 MG capsule Take 60 mg by mouth daily.   Yes [provider]  losartan (COZAAR) 100 MG tablet Take 100 mg by mouth every evening.   Yes [provider]  meloxicam (MOBIC) 7.5 MG tablet Take 7.5 mg by mouth daily. 08/06/19  Yes [provider]  methocarbamol (ROBAXIN) 500 MG tablet Take 500 mg by mouth every 8 (eight) hours as needed. 08/06/19  Yes [provider]  naproxen sodium (ALEVE) 220 MG tablet Take 1-2 tablets (220-440 mg total) by mouth 2 (two) times daily as needed (pain). 11/05/18  Yes Fields, Darleene Cleaver, MD  oxyCODONE-acetaminophen (PERCOCET) 10-325 MG tablet Take 1 tablet by mouth every 6 (six) hours as needed for pain. 09/09/19  Yes [provider]  pantoprazole (PROTONIX) 40 MG tablet 1 po 30 mins prior to your first meal 11/05/18  Yes Fields, Sandi L, MD  pregabalin (LYRICA) 150 MG capsule Take 150 mg by mouth 3 (three) times daily.   Yes [provider]  tamsulosin (FLOMAX) 0.4 MG CAPS capsule Take 1 capsule (0.4 mg total) by mouth daily after supper. Patient taking differently: Take 0.4 mg by mouth at bedtime.  05/14/18  Yes Purohit, Salli Quarry, MD    Physical Exam: BP (!) 166/85   Pulse 73   Temp 97.7 F (36.5 C) (Oral)   Resp 19   Ht 6' (1.829 m)   Wt 113.4 kg   SpO2 98%   BMI 33.91 kg/m   . General: Elderly male. Awake and alert and oriented x3. No acute cardiopulmonary distress.  Marland Kitchen HEENT: Normocephalic atraumatic.  Right and left ears normal in appearance.  Pupils equal, round, reactive to light. Extraocular muscles are intact. Sclerae anicteric and noninjected.  Moist mucosal membranes. No mucosal lesions.  . Neck: Neck supple without lymphadenopathy. No carotid bruits. No masses palpated.  . Cardiovascular: Regular rate with normal S1-S2 sounds. No murmurs, rubs, gallops  auscultated. No JVD.  Marland Kitchen Respiratory: Good respiratory effort with no wheezes, rales, rhonchi. Lungs clear to auscultation bilaterally.  No accessory muscle use. . Abdomen: Soft, nontender, nondistended. Active bowel sounds. No masses or hepatosplenomegaly  . Skin: No rashes, lesions, or ulcerations.  Dry, warm to touch. 2+ dorsalis pedis and radial pulses. . Musculoskeletal: No calf or leg pain. All major joints not erythematous nontender.  No upper or lower joint deformation.  Good ROM.  No contractures  . Psychiatric: Intact judgment and insight. Pleasant and cooperative. . Neurologic: No focal neurological deficits. Strength is 5/5 and symmetric in upper and lower extremities. Cranial nerves II through XII are grossly intact.  Coordination diminished.           Labs on Admission: I have personally reviewed following labs and imaging studies  CBC: Recent Labs  Lab 09/27/19 1520  WBC 5.9  NEUTROABS  4.1  HGB 13.3  HCT 39.8  MCV 95.4  PLT 241   Basic Metabolic Panel: Recent Labs  Lab 09/27/19 1520  NA 143  K 4.8  CL 111  CO2 23  GLUCOSE 112*  BUN 31*  CREATININE 1.76*  CALCIUM 9.5   GFR: Estimated Creatinine Clearance: 49.3 mL/min (A) (by C-G formula based on SCr of 1.76 mg/dL (H)). Liver Function Tests: Recent Labs  Lab 09/27/19 1520  AST 27  ALT 42  ALKPHOS 121  BILITOT 0.7  PROT 7.9  ALBUMIN 4.2   No results for input(s): LIPASE, AMYLASE in the last 168 hours. No results for input(s): AMMONIA in the last 168 hours. Coagulation Profile: Recent Labs  Lab 09/27/19 1520  INR 1.1   Cardiac Enzymes: No results for input(s): CKTOTAL, CKMB, CKMBINDEX, TROPONINI in the last 168 hours. BNP (last 3 results) No results for input(s): PROBNP in the last 8760 hours. HbA1C: No results for input(s): HGBA1C in the last 72 hours. CBG: No results for input(s): GLUCAP in the last 168 hours. Lipid Profile: No results for input(s): CHOL, HDL, LDLCALC, TRIG, CHOLHDL,  LDLDIRECT in the last 72 hours. Thyroid Function Tests: No results for input(s): TSH, T4TOTAL, FREET4, T3FREE, THYROIDAB in the last 72 hours. Anemia Panel: No results for input(s): VITAMINB12, FOLATE, FERRITIN, TIBC, IRON, RETICCTPCT in the last 72 hours. Urine analysis:    Component Value Date/Time   COLORURINE YELLOW 07/20/2018 0928   APPEARANCEUR CLEAR 07/20/2018 0928   LABSPEC 1.012 07/20/2018 0928   PHURINE 5.0 07/20/2018 0928   GLUCOSEU NEGATIVE 07/20/2018 0928   HGBUR MODERATE (A) 07/20/2018 0928   BILIRUBINUR NEGATIVE 07/20/2018 0928   KETONESUR NEGATIVE 07/20/2018 0928   PROTEINUR NEGATIVE 07/20/2018 0928   UROBILINOGEN 0.2 02/28/2012 1652   NITRITE NEGATIVE 07/20/2018 0928   LEUKOCYTESUR MODERATE (A) 07/20/2018 0928   Sepsis Labs: @LABRCNTIP (procalcitonin:4,lacticidven:4) )No results found for this or any previous visit (from the past 240 hour(s)).   Radiological Exams on Admission: CT Head Wo Contrast  Result Date: 09/27/2019 CLINICAL DATA:  Altered level of consciousness, slurred speech, confusion EXAM: CT HEAD WITHOUT CONTRAST TECHNIQUE: Contiguous axial images were obtained from the base of the skull through the vertex without intravenous contrast. COMPARISON:  07/24/2018 FINDINGS: Brain: Limited evaluation of the posterior fossa due to streak artifact from occipital hardware. No acute infarct or hemorrhage. There are chronic small vessel ischemic changes within the periventricular and subcortical white matter. Stable cortical atrophy with ex vacuo dilatation of the lateral ventricles. Midline structures are otherwise unremarkable. No acute extra-axial fluid collections. No mass effect. Vascular: No hyperdense vessel or unexpected calcification. Skull: Normal. Negative for fracture or focal lesion. Stable postsurgical changes from craniocervical fusion. Sinuses/Orbits: No acute finding. Other: None IMPRESSION: 1. Stable chronic small vessel ischemic changes and cortical  atrophy. No acute intracranial process. Electronically Signed   By: 07/26/2018 M.D.   On: 09/27/2019 16:10    EKG: Independently reviewed.  Sinus rhythm with prolonged PR interval  Assessment/Plan: Principal Problem:   Weakness Active Problems:   OBSTRUCTIVE SLEEP APNEA   Chronic pain syndrome   Essential hypertension   Ankylosing spondylitis (HCC)   Multiple falls   Peripheral neuropathy    This patient was discussed with the ED physician, including pertinent vitals, physical exam findings, labs, and imaging.  We also discussed care given by the ED provider.  1. Weakness, rule out TIA/stroke Observation on telemetry MRI/MRA head Carotid Dopplers  Echocardiogram tomorrow Hemoglobin A1c, lipid panel in the  morning PT/OT/speech therapy consult Full aspirin 2. Hypertension a. Permissive hypertension 3. Chronic pain with peripheral neuropathy, ankylosing spondylitis a. We will hold sedating muscle relaxants and baclofen 4. Gait instability with multiple falls a. PT/OT consult  DVT prophylaxis: Lovenox Consultants: Neurology Code Status: Full code Family Communication: Wife Disposition Plan: Pending eval   Levie Heritage, DO

## 2019-09-27 NOTE — ED Notes (Signed)
Wife in room

## 2019-09-27 NOTE — ED Provider Notes (Addendum)
Discussed with hospitalist Dr. Adrian Blackwater.  Will call back after teleneurology consult obtained.   Donnetta Hutching, MD 09/27/19 1715   Medical screening examination/treatment/procedure(s) were conducted as a shared visit with non-physician practitioner(s) and myself.  I personally evaluated the patient during the encounter.  EKG Interpretation  Date/Time:  Friday September 27 2019 16:03:49 EDT Ventricular Rate:  70 PR Interval:    QRS Duration: 110 QT Interval:  403 QTC Calculation: 435 R Axis:   46 Text Interpretation: Sinus rhythm Prolonged PR interval Confirmed by Donnetta Hutching ([id number]) on 09/27/2019 5:00:56 PM  Consult to teleneurologist.  Suspect CVA.  Patient cannot have MRI.  Will admit to general medicine.   Donnetta Hutching, MD 09/27/19 2035

## 2019-09-27 NOTE — ED Provider Notes (Signed)
[facility] EMERGENCY DEPARTMENT Provider Note   CSN: [id number] Arrival date & time: 09/27/19  1412     History Chief Complaint  Patient presents with  . Altered Mental Status    [name] is a 74 y.o. male with a history as outlined below, most significant for alcohol dependency, chronic low back pain on daily oxycodone with pain management Dr. Gerilyn Pilgrim, who was seen by a back specialist yesterday and started on baclofen with his first dose yesterday evening, hypertension, sleep apnea, hypertension, history of acute encephalopathy presenting with confusion and weakness which his wife first noticed around 115 today.   She states she woke him around 1030 today before she went grocery shopping, he was not real responsive to her, he did not get out of bed, mumbled but she states he obviously was not ready to get up so she let him be.  When she came back at 115 she noted that he had not been out of bed to take his prednisone, woke him up at this time when she noted  he had difficulty speaking and was weak in his bilateral arms.  She called EMS and she reports during their initial evaluation he was speaking clearly, but still had difficulty with arm movement and weakness.  He denies headache or neck pain, no fevers or chills.  He does complain of severe low back pain but otherwise is without complaint.  Of note, he fell the first week of March causing pain in his thoracic back with radiation into his right ribs. Imaging studies via [facility] have been negative.  He was seen by  [facility] yesterday who started his baclofen.      The history is provided by the patient and the spouse.       Past Medical History:  Diagnosis Date  . 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 Ankylosing spondylitis of multiple sites in spine (HCC)   . Chronic pain   . Chronically on opiate therapy    Pain Management d/c opiates 06/2018  . 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 pt has note from [facility] indicating difficulty  . First degree AV block    chronic  . Gait instability   . Hypertension   . Multiple falls   . OSA (obstructive sleep apnea)   . Pain management   . Peripheral axonal neuropathy   . Peripheral neuropathy 06/06/2018  . Tremor     Patient Active Problem List   Diagnosis Date Noted  . Abnormal CT scan   . Nausea and vomiting   . Opiate use   . Diarrhea   . Compression fracture of T10 vertebra (HCC) 07/25/2018  . Duodenitis 07/25/2018  . Aspiration pneumonia of both lower lobes due to gastric secretions (HCC) 07/21/2018  . Acute respiratory failure with hypoxia (HCC) 07/21/2018  . Acute kidney injury (HCC) 07/20/2018  . Dehydration 07/20/2018  . Opiate overdose (HCC) 07/20/2018  . Peripheral neuropathy 06/06/2018  . Acute encephalopathy 05/09/2018  . Multiple falls 05/08/2018  . 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 OBSTRUCTIVE SLEEP APNEA 08/20/2007  . Chronic pain syndrome 07/30/2007  . Essential hypertension 07/30/2007  . Ankylosing spondylitis (HCC) 07/30/2007    Past Surgical History:  Procedure Laterality Date  . ANKLE SURGERY Right    orif  . BACK SURGERY    . BIOPSY  11/05/2018   Procedure: BIOPSY;  Surgeon: West Bali,  MD;  Location: [facility];  Service: Endoscopy;;  . CHOLECYSTECTOMY    . ESOPHAGOGASTRODUODENOSCOPY (EGD) WITH PROPOFOL N/A 11/05/2018   Procedure: ESOPHAGOGASTRODUODENOSCOPY (EGD) WITH PROPOFOL;  Surgeon: West Bali, MD;  Location: [facility] SUITE;  Service: Endoscopy;  Laterality: N/A;  10:30am-rescheduled to 4/7 @ 7:30am per office  . JOINT REPLACEMENT Bilateral    knees  . NECK SURGERY     cervical fusion  . SPINAL CORD STIMULATOR INSERTION  2017  . TOTAL KNEE ARTHROPLASTY         Family History  Problem Relation Age of Onset  . Heart disease Mother     Social History   Tobacco Use  . Smoking status: Never Smoker   . Smokeless tobacco: Never Used  Substance Use Topics  . Alcohol use: Not Currently  . Drug use: No    Home Medications Prior to Admission medications   Medication Sig Start Date End Date Taking? Authorizing Provider  amLODipine (NORVASC) 10 MG tablet Take 10 mg by mouth every evening.     [provider]  Buprenorphine HCl (BELBUCA) 75 MCG FILM Place 75 mcg inside cheek 2 (two) times daily.    [provider]  cetirizine (ZYRTEC) 10 MG tablet Take 10 mg by mouth daily as needed for allergies.    [provider]  dicyclomine (BENTYL) 10 MG capsule Take 1 capsule (10 mg total) by mouth 2 (two) times daily. Patient not taking: Reported on 09/17/2018 07/31/18   Erick Blinks, MD  DULoxetine (CYMBALTA) 30 MG capsule Take 60 mg by mouth daily.    [provider]  losartan (COZAAR) 100 MG tablet Take 100 mg by mouth every evening.    [provider]  naproxen sodium (ALEVE) 220 MG tablet Take 1-2 tablets (220-440 mg total) by mouth 2 (two) times daily as needed (pain). 11/05/18   Fields, Darleene Cleaver, MD  pantoprazole (PROTONIX) 40 MG tablet 1 po 30 mins prior to your first meal 11/05/18   Fields, Sandi L, MD  pregabalin (LYRICA) 150 MG capsule Take 150 mg by mouth 3 (three) times daily.    [provider]  tamsulosin (FLOMAX) 0.4 MG CAPS capsule Take 1 capsule (0.4 mg total) by mouth daily after supper. Patient taking differently: Take 0.4 mg by mouth at bedtime.  05/14/18   Purohit, Salli Quarry, MD    Allergies    Codeine  Review of Systems   Review of Systems  Constitutional: Positive for fatigue. Negative for chills.  HENT: Negative for congestion and sore throat.   Eyes: Negative.   Respiratory: Negative for chest tightness and shortness of breath.   Cardiovascular: Negative.  Negative for chest pain.  Gastrointestinal: Negative.   Genitourinary: Negative.   Musculoskeletal: Positive for back pain. Negative for arthralgias, joint swelling  and neck pain.  Skin: Negative.  Negative for wound.  Neurological: Positive for speech difficulty and weakness. Negative for dizziness, facial asymmetry, numbness and headaches.    Physical Exam Updated Vital Signs BP (!) 167/86   Pulse 67   Temp 97.7 F (36.5 C) (Oral)   Resp 14   Ht 6' (1.829 m)   Wt 113.4 kg   SpO2 95%   BMI 33.91 kg/m   Physical Exam Vitals and nursing note reviewed.  Constitutional:      Appearance: He is well-developed.  HENT:     Head: Normocephalic and atraumatic.  Eyes:     Conjunctiva/sclera: Conjunctivae normal.  Cardiovascular:     Rate  and Rhythm: Normal rate and regular rhythm.     Heart sounds: Normal heart sounds.  Pulmonary:     Effort: Pulmonary effort is normal.     Breath sounds: Normal breath sounds. No wheezing.  Abdominal:     General: Abdomen is protuberant. Bowel sounds are normal.     Palpations: Abdomen is soft.     Tenderness: There is no abdominal tenderness.  Musculoskeletal:        General: Tenderness present.     Cervical back: Normal range of motion.     Thoracic back: Tenderness present. No swelling or deformity.     Lumbar back: Tenderness present. No swelling or deformity.  Skin:    General: Skin is warm and dry.  Neurological:     Mental Status: He is alert and oriented to person, place, and time.     Cranial Nerves: Cranial nerves are intact.     Sensory: No sensory deficit.     Coordination: Coordination abnormal. Finger-Nose-Finger Test abnormal and Heel to [place] Test abnormal.     Comments: Cranial nerves intact.  Patient has equal but reduced grip strength bilaterally, 3+.  He is able to raise his arms bilaterally but is then is unable to control movements with his right arms and legs, he is uncoordinated with heel-to-shin with his right leg.  He can raise his arms but cannot control them to bring them back to his sides, right right jerky movements, then flops back onto bed.     ED Results / Procedures /  Treatments   Labs (all labs ordered are listed, but only abnormal results are displayed) Labs Reviewed  CBC - Abnormal; Notable for the following components:      Result Value   RBC 4.17 (*)    All other components within normal limits  COMPREHENSIVE METABOLIC PANEL - Abnormal; Notable for the following components:   Glucose, Bld 112 (*)    BUN 31 (*)    Creatinine, Ser 1.76 (*)    GFR calc non Af Amer 38 (*)    GFR calc Af Amer 44 (*)    All other components within normal limits  ETHANOL  PROTIME-INR  APTT  DIFFERENTIAL  RAPID URINE DRUG SCREEN, HOSP PERFORMED  URINALYSIS, ROUTINE W REFLEX MICROSCOPIC    EKG EKG Interpretation  Date/Time:  Friday September 27 2019 16:03:49 EDT Ventricular Rate:  70 PR Interval:    QRS Duration: 110 QT Interval:  403 QTC Calculation: 435 R Axis:   46 Text Interpretation: Sinus rhythm Prolonged PR interval Confirmed by Donnetta Hutching ([id number]) on 09/27/2019 5:00:56 PM   Radiology CT Head Wo Contrast  Result Date: 09/27/2019 CLINICAL DATA:  Altered level of consciousness, slurred speech, confusion EXAM: CT HEAD WITHOUT CONTRAST TECHNIQUE: Contiguous axial images were obtained from the base of the skull through the vertex without intravenous contrast. COMPARISON:  07/24/2018 FINDINGS: Brain: Limited evaluation of the posterior fossa due to streak artifact from occipital hardware. No acute infarct or hemorrhage. There are chronic small vessel ischemic changes within the periventricular and subcortical white matter. Stable cortical atrophy with ex vacuo dilatation of the lateral ventricles. Midline structures are otherwise unremarkable. No acute extra-axial fluid collections. No mass effect. Vascular: No hyperdense vessel or unexpected calcification. Skull: Normal. Negative for fracture or focal lesion. Stable postsurgical changes from craniocervical fusion. Sinuses/Orbits: No acute finding. Other: None IMPRESSION: 1. Stable chronic small vessel ischemic  changes and cortical atrophy. No acute intracranial process. Electronically Signed   By: Casimiro Needle  Owens Shark M.D.   On: 09/27/2019 16:10    Procedures Procedures (including critical care time)  Medications Ordered in ED Medications  oxyCODONE-acetaminophen (PERCOCET/ROXICET) 5-325 MG per tablet 2 tablet (has no administration in time range)    ED Course  I have reviewed the triage vital signs and the nursing notes.  Pertinent labs & imaging results that were available during my care of the patient were reviewed by me and considered in my medical decision making (see chart for details).    MDM Rules/Calculators/A&P                      Pt with AMS and ataxia right upper and lower extremities.  Pending teleneurology consult. CT imaging negative but suboptimal imaging of posterior fossa.  Unknown last known well, not candidate for tpa.  Will require admission for stroke work up. Pt has been admitted in the past for acute encephalopathy, also newly started baclofen.  Possible sx medication side effect vs metabolic process, but lower on differential.   Pending teleneuro consult.  Currently delayed due to cva on the floor, teleneuro not currently available in ed.  Will require admission.  Pt signed out to Dr. Lacinda Axon who also saw pt during this visit.   CRITICAL CARE Performed by: Evalee Jefferson Total critical care time: 40 minutes Critical care time was exclusive of separately billable procedures and treating other patients. Critical care was necessary to treat or prevent imminent or life-threatening deterioration. Critical care was time spent personally by me on the following activities: development of treatment plan with patient and/or surrogate as well as nursing, discussions with consultants, evaluation of patient's response to treatment, examination of patient, obtaining history from patient or surrogate, ordering and performing treatments and interventions, ordering and review of laboratory  studies, ordering and review of radiographic studies, pulse oximetry and re-evaluation of patient's condition.    Final Clinical Impression(s) / ED Diagnoses Final diagnoses:  Altered mental status, unspecified altered mental status type  Ataxia  Weakness    Rx / DC Orders ED Discharge Orders    None       Landis Martins 09/28/19 [place], MD 09/28/19 1530

## 2019-09-27 NOTE — ED Notes (Signed)
ED Provider at bedside. 

## 2019-09-27 NOTE — ED Triage Notes (Signed)
Pt with altered mental status since started new medication Baclofen for his back pain.  Wife left this am and came back, pt was still in bed with slurred speech.  After ems arrived and assisted pt his speech cleared up but pt still confused to date. Pt lives at home with wife. cbg 109 160/79 95% ra per ems

## 2019-09-27 NOTE — Consult Note (Signed)
[facility] Tele[facility]urology Consult Services  Stat Consult  Date of Service:   09/27/2019 19:13:51  Impression:     .  G93.49 - Encephalopathy Multifactorial     .  R53.1 - Weakness  Comments/Sign-Out: 74 yo M with acuteonset of encephalopathy, expressive aphasia and right leg weakness. He also has myoclonus with arms outstretched, and has been having issues with spasms resulting in taking his first dose of baclofen yesterday. In setting of alcohol dependence, renal insufficiency and opiate use/polypharmacy, it is possible the baclofen led to sedation and AMS this morning but should be resolved by now  CT HEAD: Showed No Acute Hemorrhage or Acute Core Infarct Right occipital plate from C spine fusion  Metrics: [facility] Notification Time: 09/27/2019 19:12:14 Stamp Time: 09/27/2019 19:13:51 Callback Response Time: 09/27/2019 19:15:07  Our recommendations are outlined below.  Recommendations:     .  Has plate in his head and spinal stimulator so do not think he can get an MRI     .  Repeat HCT non contrast tomorrow morning to rule out stroke  Imaging Studies:     .  Carotid Dopplers     .  Echocardiogram - Transthoracic Echocardiogram  Therapies:     .  Physical Therapy, Occupational Therapy, Speech Therapy Assessment When Applicable  Other WorkUp:     .  Check CMP     .  Check an ammonia level     .  Check B12 level     .  Check TSH     .  Check Urinalysis  Disposition: Neurology Follow Up Recommended  Sign Out:     .  Discussed with Emergency Department Provider  ----------------------------------------------------------------------------------------------------  Chief Complaint: AMS  History of Present Illness: Patient is a 74 year old Male.  74 yo M hx chronic back pain on oxycodone and recently started on baclofen after injury from a fall, daily alcohol, ankylosing spondylitis, HTN, OSA, neuropathy and tremor who presents with AMS,  possibly rt sided weakness. Unknown when last well. At 1030 am he was mumbly and wife felt he was more impaired and confused by afternoon. His gait has not been good for some time, but since the fall March 5th he has not been walking much even with his walker. He saw a spine specialist at [facility]       Examination: BP(140/73), Pulse(77), Blood Glucose(112) 1A: Level of Consciousness - Alert; keenly responsive + 0 1B: Ask Month and Age - Both Questions Right + 0 1C: Blink Eyes & Squeeze Hands - Performs Both Tasks + 0 2: Test Horizontal Extraocular Movements - Normal + 0 3: Test Visual Fields - No Visual Loss + 0 4: Test Facial Palsy (Use Grimace if Obtunded) - Normal symmetry + 0 5A: Test Left Arm Motor Drift - No Drift for 10 Seconds + 0 5B: Test Right Arm Motor Drift - No Drift for 10 Seconds + 0 6A: Test Left Leg Motor Drift - No Drift for 5 Seconds + 0 6B: Test Right Leg Motor Drift - No Drift for 5 Seconds + 0 7: Test Limb Ataxia (FNF/Heel-Shin) - No Ataxia + 0 8: Test Sensation - Normal; No sensory loss + 0 9: Test Language/Aphasia - Normal; No aphasia + 0 10: Test Dysarthria - Normal + 0 11: Test Extinction/Inattention - No abnormality + 0  NIHSS Score: 0   Patient/Family was informed the Neurology Consult would occur via TeleHealth consult by way of interactive audio and video telecommunications and consented  to receiving care in this manner.  Due to the immediate potential for life-threatening deterioration due to underlying acute neurologic illness, I spent 35 minutes providing critical care. This time includes time for face to face visit via telemedicine, review of medical records, imaging studies and discussion of findings with providers, the patient and/or family.   Dr Schuyler Amor   [facility] [phone]  Case [id number]

## 2019-09-28 ENCOUNTER — Observation Stay (HOSPITAL_COMMUNITY): Payer: Medicare Other

## 2019-09-28 ENCOUNTER — Observation Stay (HOSPITAL_BASED_OUTPATIENT_CLINIC_OR_DEPARTMENT_OTHER): Payer: Medicare Other

## 2019-09-28 ENCOUNTER — Encounter (HOSPITAL_COMMUNITY): Payer: Self-pay | Admitting: Family Medicine

## 2019-09-28 DIAGNOSIS — R531 Weakness: Secondary | ICD-10-CM | POA: Diagnosis not present

## 2019-09-28 DIAGNOSIS — G459 Transient cerebral ischemic attack, unspecified: Secondary | ICD-10-CM

## 2019-09-28 LAB — LIPID PANEL
Cholesterol: 159 mg/dL (ref 0–200)
HDL: 39 mg/dL — ABNORMAL LOW (ref >40)
LDL Cholesterol: 103 mg/dL — ABNORMAL HIGH (ref 0–99)
Total CHOL/HDL Ratio: 4.1 RATIO
Triglycerides: 84 mg/dL (ref <150)
VLDL: 17 mg/dL (ref 0–40)

## 2019-09-28 LAB — ECHOCARDIOGRAM COMPLETE
Height: 72 in
Weight: 3746.06 oz

## 2019-09-28 LAB — HEMOGLOBIN A1C
Hgb A1c MFr Bld: 5.4 % (ref 4.8–5.6)
Mean Plasma Glucose: 108.28 mg/dL

## 2019-09-28 LAB — BASIC METABOLIC PANEL
Anion gap: 12 (ref 5–15)
BUN: 28 mg/dL — ABNORMAL HIGH (ref 8–23)
CO2: 23 mmol/L (ref 22–32)
Calcium: 10 mg/dL (ref 8.9–10.3)
Chloride: 109 mmol/L (ref 98–111)
Creatinine, Ser: 1.5 mg/dL — ABNORMAL HIGH (ref 0.61–1.24)
GFR calc Af Amer: 53 mL/min — ABNORMAL LOW (ref >60)
GFR calc non Af Amer: 46 mL/min — ABNORMAL LOW (ref >60)
Glucose, Bld: 109 mg/dL — ABNORMAL HIGH (ref 70–99)
Potassium: 4.7 mmol/L (ref 3.5–5.1)
Sodium: 144 mmol/L (ref 135–145)

## 2019-09-28 MED ORDER — ATORVASTATIN CALCIUM 40 MG PO TABS
40.0000 mg | ORAL_TABLET | Freq: Every day | ORAL | Status: DC
  Administered 2019-09-28: 40 mg via ORAL
  Filled 2019-09-28: qty 1

## 2019-09-28 MED ORDER — PERFLUTREN LIPID MICROSPHERE
1.0000 mL | INTRAVENOUS | Status: AC | PRN
  Administered 2019-09-28: 2 mL via INTRAVENOUS
  Filled 2019-09-28: qty 10

## 2019-09-28 MED ORDER — ASPIRIN EC 81 MG PO TBEC
81.0000 mg | DELAYED_RELEASE_TABLET | Freq: Every day | ORAL | Status: DC
  Administered 2019-09-28 – 2019-09-29 (×2): 81 mg via ORAL
  Filled 2019-09-28 (×2): qty 1

## 2019-09-28 MED ORDER — MORPHINE SULFATE (PF) 2 MG/ML IV SOLN
2.0000 mg | Freq: Once | INTRAVENOUS | Status: AC
  Administered 2019-09-28: 2 mg via INTRAVENOUS
  Filled 2019-09-28: qty 1

## 2019-09-28 NOTE — Progress Notes (Signed)
  Echocardiogram 2D Echocardiogram has been performed.  [name] 09/28/2019, 3:25 PM

## 2019-09-28 NOTE — Plan of Care (Signed)
  Problem: Acute Rehab PT Goals(only PT should resolve) Goal: Pt Will Go Supine/Side To Sit Outcome: Progressing Flowsheets (Taken 09/28/2019 1005) Pt will go Supine/Side to Sit: with modified independence Goal: Patient Will Transfer Sit To/From Stand Outcome: Progressing Flowsheets (Taken 09/28/2019 1005) Patient will transfer sit to/from stand: with supervision Goal: Pt Will Transfer Bed To Chair/Chair To Bed Outcome: Progressing Flowsheets (Taken 09/28/2019 1005) Pt will Transfer Bed to Chair/Chair to Bed: with supervision Goal: Pt Will Ambulate Outcome: Progressing Flowsheets (Taken 09/28/2019 1005) Pt will Ambulate:  50 feet  with supervision   10:05 AM, 09/28/19 Ocie Bob, MPT Physical Therapist with [facility] [phone] office [phone] mobile phone

## 2019-09-28 NOTE — Progress Notes (Signed)
Patient Demographics:    [name], is a 74 y.o. male, DOB - February 21, 1946, DGL:[id number]  Admit date - 09/27/2019   Admitting Physician No admitting provider for patient encounter.  Outpatient Primary MD for the patient is [name], Neil A, MD  LOS - 0   Chief Complaint  Patient presents with  . Altered Mental Status        Subjective:    [name] today has no fevers, no emesis,  No chest pain,   --Generalized weakness, wife at bedside states patient not back to baseline yet -Gait concerns persist -As per wife patient talking out of his head from time to time  Assessment  & Plan :    Principal Problem:   Weakness Active Problems:   OBSTRUCTIVE SLEEP APNEA   Chronic pain syndrome   Essential hypertension   Ankylosing spondylitis ([place])   Multiple falls   Peripheral neuropathy  Brief Summary:- 74 y.o. male with a history of ankylosing spondylitis, chronic pain, peripheral axonal neuropathy, gait instability with multiple falls, hypertension remote history of alcohol abuse admitted on 09/27/2019 with acute encephalopathy, speech disturbance and weakness after starting baclofen the previous evening  A/p 1)Acute Metabolic Encephalopathy-----stroke versus baclofen side effect-----as per patient and wife appears to be improving but not back to baseline -Admission CT head without acute findings unable to do MRI due to neuro stimulator in situ -Telemetry neurology consult appreciated recommends completing inpatient stroke work-up prior to discharge home ---Awaiting repeat CT head on 09/29/2019 -Carotid artery Dopplers without hemodynamically significant stenosis -Echo pending -PT eval appreciated recommends home health physical therapy -LDL is 103, HDL is 39, A1c is 5.4 -Aspirin and Lipitor as ordered -Stop baclofen  2)HTN--allow permissive hypertension  3) generalized  weakness/ambulatory dysfunction/recurrent falls-----patient with chronic pain on multiple sedative agents, PT eval appreciated recommends home health PT  Disposition/Need for in-Hospital Stay- patient unable to be discharged at this time due to --- possible stroke/acute metabolic encephalopathy, not back to baseline, awaiting repeat CT head on 09/29/2019 to determine disposition -Anticipate home with [facility] in am   Code Status : FULL  Family Communication:   (Discussed with wife  Consults  :  na  DVT Prophylaxis  :  Lovenox - SCDs   Lab Results  Component Value Date   PLT 241 09/27/2019    Inpatient Medications  Scheduled Meds: . DULoxetine  60 mg Oral Daily  . enoxaparin (LOVENOX) injection  40 mg Subcutaneous Q24H  . loratadine  10 mg Oral Daily  . meloxicam  7.5 mg Oral Daily  . pantoprazole  40 mg Oral Daily  . pregabalin  150 mg Oral TID  . tamsulosin  0.4 mg Oral QHS   Continuous Infusions: PRN Meds:.acetaminophen **OR** acetaminophen (TYLENOL) oral liquid 160 mg/5 mL **OR** acetaminophen, oxyCODONE-acetaminophen **AND** oxyCODONE, senna-docusate   Anti-infectives (From admission, onward)   None        Objective:   Vitals:   09/28/19 0557 09/28/19 0900 09/28/19 1306 09/28/19 1517  BP: (!) 144/71 (!) 155/80 (!) 148/92 (!) 150/75  Pulse: 68 62 (!) 114 63  Resp: 18 20 20 20   Temp: 98.3 F (36.8 C) 98.3 F (36.8 C) 97.6 F (36.4 C) 98.3 F (36.8 C)  TempSrc: Oral Oral Oral  Oral  SpO2: 100% 98% 99% 98%  Weight:      Height:        Wt Readings from Last 3 Encounters:  09/27/19 106.2 kg  11/05/18 108.9 kg  11/01/18 108.9 kg    No intake or output data in the 24 hours ending 09/28/19 1615   Physical Exam  Gen:- Awake Alert, --- patient's wife believes is not quite back to his usual self HEENT:- [place].AT, No sclera icterus Neck-Supple Neck,No JVD,.  Lungs-  CTAB , fair symmetrical air movement CV- S1, S2 normal, regular  Abd-  +ve B.Sounds, Abd Soft, No  tenderness,    Extremity/Skin:- No  edema, pedal pulses present  Psych-affect is appropriate, -patient's wife believes is not quite back to his usual self, ??  Talking out of his head at times Neuro-generalized weakness no new focal deficits, no tremors   Data Review:   Micro Results No results found for this or any previous visit (from the past 240 hour(s)).  Radiology Reports CT Head Wo Contrast  Result Date: 09/27/2019 CLINICAL DATA:  Altered level of consciousness, slurred speech, confusion EXAM: CT HEAD WITHOUT CONTRAST TECHNIQUE: Contiguous axial images were obtained from the base of the skull through the vertex without intravenous contrast. COMPARISON:  07/24/2018 FINDINGS: Brain: Limited evaluation of the posterior fossa due to streak artifact from occipital hardware. No acute infarct or hemorrhage. There are chronic small vessel ischemic changes within the periventricular and subcortical white matter. Stable cortical atrophy with ex vacuo dilatation of the lateral ventricles. Midline structures are otherwise unremarkable. No acute extra-axial fluid collections. No mass effect. Vascular: No hyperdense vessel or unexpected calcification. Skull: Normal. Negative for fracture or focal lesion. Stable postsurgical changes from craniocervical fusion. Sinuses/Orbits: No acute finding. Other: None IMPRESSION: 1. Stable chronic small vessel ischemic changes and cortical atrophy. No acute intracranial process. Electronically Signed   By: Sharlet Salina M.D.   On: 09/27/2019 16:10   US Carotid Bilateral (at [facility] and [facility] only)  Result Date: 09/28/2019 CLINICAL DATA:  Recent TIA.  Hypertension, previous cervical fusion EXAM: BILATERAL CAROTID DUPLEX ULTRASOUND TECHNIQUE: Wallace Cullens scale imaging, color Doppler and duplex ultrasound were performed of bilateral carotid and vertebral arteries in the neck. Technologist describes technically difficult study secondary to patient being scanned while in chair, neck in  fixed position. COMPARISON:  None. FINDINGS: Criteria: Quantification of carotid stenosis is based on velocity parameters that correlate the residual internal carotid diameter with NASCET-based stenosis levels, using the diameter of the distal internal carotid lumen as the denominator for stenosis measurement. The following velocity measurements were obtained: RIGHT ICA: 60/13 cm/sec CCA: 68/10 cm/sec SYSTOLIC ICA/CCA RATIO:  0.9 ECA: 75 cm/sec LEFT ICA: 46/9 cm/sec CCA: 64/6 cm/sec SYSTOLIC ICA/CCA RATIO:  0.7 ECA: 95 cm/sec RIGHT CAROTID ARTERY: Moderate tortuosity. Eccentric calcified plaque in the bulb and proximal ICA without high-grade stenosis. Normal waveforms and color Doppler signal. RIGHT VERTEBRAL ARTERY:  Normal flow direction and waveform. LEFT CAROTID ARTERY: Mild tortuosity. Mild plaque in the bulb and ICA origin. No high-grade stenosis. Normal waveforms and color Doppler signal. LEFT VERTEBRAL ARTERY:  Normal flow direction and waveform. IMPRESSION: 1. Bilateral carotid bifurcation plaque resulting in less than 50% diameter ICA stenosis. 2. Antegrade bilateral vertebral arterial flow. Electronically Signed   By: Corlis Leak M.D.   On: 09/28/2019 11:31     CBC Recent Labs  Lab 09/27/19 1520  WBC 5.9  HGB 13.3  HCT 39.8  PLT 241  MCV 95.4  MCH  31.9  MCHC 33.4  RDW 12.3  LYMPHSABS 1.4  MONOABS 0.3  EOSABS 0.1  BASOSABS 0.1    Chemistries  Recent Labs  Lab 09/27/19 1520 09/28/19 0637  NA 143 144  K 4.8 4.7  CL 111 109  CO2 23 23  GLUCOSE 112* 109*  BUN 31* 28*  CREATININE 1.76* 1.50*  CALCIUM 9.5 10.0  AST 27  --   ALT 42  --   ALKPHOS 121  --   BILITOT 0.7  --    ------------------------------------------------------------------------------------------------------------------ Recent Labs    09/28/19 0637  CHOL 159  HDL 39*  LDLCALC 103*  TRIG 84  CHOLHDL 4.1    Lab Results  Component Value Date   HGBA1C 5.4 09/28/2019    ------------------------------------------------------------------------------------------------------------------ No results for input(s): TSH, T4TOTAL, T3FREE, THYROIDAB in the last 72 hours.  Invalid input(s): FREET3 ------------------------------------------------------------------------------------------------------------------ No results for input(s): VITAMINB12, FOLATE, FERRITIN, TIBC, IRON, RETICCTPCT in the last 72 hours.  Coagulation profile Recent Labs  Lab 09/27/19 1520  INR 1.1    No results for input(s): DDIMER in the last 72 hours.  Cardiac Enzymes No results for input(s): CKMB, TROPONINI, MYOGLOBIN in the last 168 hours.  Invalid input(s): CK ------------------------------------------------------------------------------------------------------------------    Component Value Date/Time   BNP 701.0 (H) 07/21/2018 7564     Shon Hale M.D on 09/28/2019 at 4:15 PM  Go to [email or web address] - for contact info  [facility] - Office  [phone]

## 2019-09-28 NOTE — Evaluation (Addendum)
Physical Therapy Evaluation Patient Details Name: [name] MRN: [id number] DOB: 1945-12-15 Today's Date: 09/28/2019   History of Present Illness  [name] is a 74 y.o. male with a history of ankylosing spondylitis, chronic pain, peripheral axonal neuropathy, gait instability with multiple falls, hypertension remote history of alcohol abuse.  He is managed by Dr. Gerilyn Pilgrim for chronic pain management.  Patient brought to the hospital due to concerns of altered mental status with slurred speech and right leg weakness.  Last seen well was last night.  Patient's wife woke him this morning around 1030, however he was not particularly responsive and coherent.  When she returned at 115, she noticed that he had not been out of bed.  When she woke him up, she noted that he was confused, had difficulty speaking and had weakness in his arms bilaterally.  EMS was called.  They noted no slurred speech, however they found that he was weak in his arms and right leg.  No fevers, chills, nausea, vomiting.    Clinical Impression  Patient functioning near baseline for functional mobility and gait, had difficulty locking knees during sit to stand secondary to weakness, after taking a few steps patient demonstrated improvement for supporting self with BLE, able to ambulate into hallway without loss of balance, limited secondary to c/o fatigue and tolerated sitting up in chair after therapy - nursing staff notified.  Patient had no significant difference in BUE or BLE strength and coordination during MMT.  Patient will benefit from continued physical therapy in hospital and recommended venue below to increase strength, balance, endurance for safe ADLs and gait.     Follow Up Recommendations Home health PT;Supervision for mobility/OOB;Supervision - Intermittent    Equipment Recommendations  None recommended by PT    Recommendations for Other Services       Precautions / Restrictions  Precautions Precautions: Fall Restrictions Weight Bearing Restrictions: No      Mobility  Bed Mobility Overal bed mobility: Modified Independent             General bed mobility comments: HOB raised, use of bed rail, slightly increased time  Transfers Overall transfer level: Needs assistance Equipment used: Rolling walker (2 wheeled) Transfers: Stand Pivot Transfers;Sit to/from Stand Sit to Stand: Min assist Stand pivot transfers: Min guard       General transfer comment: had diffiuclty locking knees during intial sit to stand due to weakness  Ambulation/Gait Ambulation/Gait assistance: Min guard Gait Distance (Feet): 35 Feet Assistive device: Rolling walker (2 wheeled) Gait Pattern/deviations: Decreased step length - right;Decreased step length - left;Decreased stride length Gait velocity: decreased   General Gait Details: slow labored cadence with difficulty fully locking knees due to weakness, increase time for makiing turns, no loss of balance, limited secondary to c/o fatigue  Stairs            Wheelchair Mobility    Modified Rankin (Stroke Patients Only)       Balance Overall balance assessment: Needs assistance Sitting-balance support: Feet supported;No upper extremity supported Sitting balance-Leahy Scale: Good Sitting balance - Comments: seated at EOB   Standing balance support: During functional activity;Bilateral upper extremity supported Standing balance-Leahy Scale: Fair Standing balance comment: using RW                             Pertinent Vitals/Pain Pain Assessment: Faces Faces Pain Scale: Hurts little more Pain Location: bilateral shoulders and across left side  of chest wall, chronic low back pain Pain Descriptors / Indicators: Sore;Aching Pain Intervention(s): Limited activity within patient's tolerance;Monitored during session;Repositioned;Patient requesting pain meds-RN notified    Home Living Family/patient  expects to be discharged to:: Private residence Living Arrangements: Spouse/significant other Available Help at Discharge: Family;Available 24 hours/day Type of Home: House Home Access: Ramped entrance     Home Layout: Two level;Able to live on main level with bedroom/bathroom Home Equipment: Gilford Rile - 2 wheels;Walker - 4 wheels;Cane - single point;Bedside commode;Shower seat;Wheelchair - manual;Hospital bed      Prior Function Level of Independence: Independent with assistive device(s)         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 Dominance   Dominant Hand: Right    Extremity/Trunk Assessment   Upper Extremity Assessment Upper Extremity Assessment: Defer to OT evaluation    Lower Extremity Assessment Lower Extremity Assessment: Generalized weakness    Cervical / Trunk Assessment Cervical / Trunk Assessment: Kyphotic  Communication   Communication: No difficulties  Cognition Arousal/Alertness: Awake/alert Behavior During Therapy: WFL for tasks assessed/performed Overall Cognitive Status: Within Functional Limits for tasks assessed                                        General Comments      Exercises     Assessment/Plan    PT Assessment Patient needs continued PT services  PT Problem List Decreased strength;Decreased activity tolerance;Decreased mobility;Decreased balance       PT Treatment Interventions Gait training;Functional mobility training;Therapeutic activities;Therapeutic exercise;Patient/family education;Stair training    PT Goals (Current goals can be found in the Care Plan section)  Acute Rehab PT Goals Patient Stated Goal: return home with family to assist PT Goal Formulation: With patient Time For Goal Achievement: 09/30/19 Potential to Achieve Goals: Good    Frequency Min 3X/week   Barriers to discharge        Co-evaluation               AM-PAC PT "6  Clicks" Mobility  Outcome Measure Help needed turning from your back to your side while in a flat bed without using bedrails?: None Help needed moving from lying on your back to sitting on the side of a flat bed without using bedrails?: A Little Help needed moving to and from a bed to a chair (including a wheelchair)?: A Little Help needed standing up from a chair using your arms (e.g., wheelchair or bedside chair)?: A Little Help needed to walk in hospital room?: A Little Help needed climbing 3-5 steps with a railing? : A Lot 6 Click Score: 18    End of Session   Activity Tolerance: Patient tolerated treatment well;Patient limited by fatigue Patient left: in chair;with call bell/phone within reach;with chair alarm set Nurse Communication: Mobility status PT Visit Diagnosis: Unsteadiness on feet (R26.81);Other abnormalities of gait and mobility (R26.89);Muscle weakness (generalized) (M62.81)    Time: 0174-9449 PT Time Calculation (min) (ACUTE ONLY): 31 min   Charges:   PT Evaluation $PT Eval Moderate Complexity: 1 Mod PT Treatments $Therapeutic Activity: 23-37 mins        10:01 AM, 09/28/19 Lonell Grandchild, MPT Physical Therapist with [facility] [address] [phone] office [phone] mobile phone

## 2019-09-28 NOTE — Care Management Obs Status (Signed)
MEDICARE OBSERVATION STATUS NOTIFICATION   Patient Details  Name: [name] MRN: [id number] Date of Birth: August 13, 1945   Medicare Observation Status Notification Given:  Yes    Leitha Bleak, RN 09/28/2019, 4:22 PM

## 2019-09-29 ENCOUNTER — Observation Stay (HOSPITAL_COMMUNITY): Payer: Medicare Other

## 2019-09-29 DIAGNOSIS — R531 Weakness: Secondary | ICD-10-CM | POA: Diagnosis not present

## 2019-09-29 LAB — BASIC METABOLIC PANEL
Anion gap: 11 (ref 5–15)
BUN: 29 mg/dL — ABNORMAL HIGH (ref 8–23)
CO2: 25 mmol/L (ref 22–32)
Calcium: 9.8 mg/dL (ref 8.9–10.3)
Chloride: 106 mmol/L (ref 98–111)
Creatinine, Ser: 1.59 mg/dL — ABNORMAL HIGH (ref 0.61–1.24)
GFR calc Af Amer: 50 mL/min — ABNORMAL LOW (ref >60)
GFR calc non Af Amer: 43 mL/min — ABNORMAL LOW (ref >60)
Glucose, Bld: 105 mg/dL — ABNORMAL HIGH (ref 70–99)
Potassium: 4.6 mmol/L (ref 3.5–5.1)
Sodium: 142 mmol/L (ref 135–145)

## 2019-09-29 MED ORDER — LOSARTAN POTASSIUM 25 MG PO TABS: 25.0000 mg | ORAL_TABLET | Freq: Every day | ORAL | 2 refills | Status: DC

## 2019-09-29 MED ORDER — TAMSULOSIN HCL 0.4 MG PO CAPS: 0.4000 mg | ORAL_CAPSULE | Freq: Every day | ORAL | 2 refills | Status: AC

## 2019-09-29 MED ORDER — ACETAMINOPHEN 325 MG PO TABS: 650.0000 mg | ORAL_TABLET | ORAL | 0 refills | Status: AC | PRN

## 2019-09-29 MED ORDER — MELOXICAM 7.5 MG PO TABS: 7.5000 mg | ORAL_TABLET | Freq: Every day | ORAL | 1 refills | Status: DC

## 2019-09-29 MED ORDER — AMLODIPINE BESYLATE 5 MG PO TABS: 5.0000 mg | ORAL_TABLET | Freq: Every day | ORAL | 1 refills | Status: DC

## 2019-09-29 MED ORDER — SENNOSIDES-DOCUSATE SODIUM 8.6-50 MG PO TABS: 2.0000 | ORAL_TABLET | Freq: Every day | ORAL | 11 refills | Status: AC

## 2019-09-29 NOTE — Discharge Summary (Signed)
[name], is a 74 y.o. male  DOB 09-Nov-1945  MRN [id number].  Admission date:  09/27/2019  Admitting Physician  No admitting provider for patient encounter.  Discharge Date:  09/29/2019   Primary MD  Corrington, Meredith Mody, MD  Recommendations for primary care physician for things to follow:    1) amlodipine has been decreased to 5 mg daily, losartan has been decreased to 25 mg daily 2) please follow-up with Dr. Gerilyn Pilgrim for ongoing pain management 3) avoid baclofen  Admission Diagnosis  Ataxia [R27.0] Weakness [R53.1] Altered mental status, unspecified altered mental status type [R41.82]   Discharge Diagnosis  Ataxia [R27.0] Weakness [R53.1] Altered mental status, unspecified altered mental status type [R41.82]    Principal Problem:   Weakness Active Problems:   Peripheral neuropathy   OBSTRUCTIVE SLEEP APNEA   Chronic pain syndrome   Essential hypertension   Ankylosing spondylitis (HCC)   Multiple falls      Past Medical History:  Diagnosis Date  . 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 Ankylosing spondylitis of multiple sites in spine (HCC)   . Chronic pain   . Chronically on opiate therapy    Pain Management d/c opiates 06/2018  . 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 pt has note from [facility] indicating difficulty  . First degree AV block    chronic  . Gait instability   . Hypertension   . Multiple falls   . OSA (obstructive sleep apnea)   . Pain management   . Peripheral axonal neuropathy   . Peripheral neuropathy 06/06/2018  . Tremor     Past Surgical History:  Procedure Laterality Date  . ANKLE SURGERY Right    orif  . BACK SURGERY    . BIOPSY  11/05/2018   Procedure: BIOPSY;  Surgeon: West Bali, MD;  Location: [facility];  Service: Endoscopy;;  . CHOLECYSTECTOMY    .  ESOPHAGOGASTRODUODENOSCOPY (EGD) WITH PROPOFOL N/A 11/05/2018   Procedure: ESOPHAGOGASTRODUODENOSCOPY (EGD) WITH PROPOFOL;  Surgeon: West Bali, MD;  Location: [facility];  Service: Endoscopy;  Laterality: N/A;  10:30am-rescheduled to 4/7 @ 7:30am per office  . JOINT REPLACEMENT Bilateral    knees  . NECK SURGERY     cervical fusion  . SPINAL CORD STIMULATOR INSERTION  2017  . TOTAL KNEE ARTHROPLASTY       HPI  from the history and physical done on the day of admission:    Patient Coming From: home  Chief Complaint: AMS, slurred speech  HPI: [name] is a 74 y.o. male with a history of ankylosing spondylitis, chronic pain, peripheral axonal neuropathy, gait instability with multiple falls, hypertension remote history of alcohol abuse.  He is managed by Dr. Gerilyn Pilgrim for chronic pain management.  Patient brought to the hospital due to concerns of altered mental status with slurred speech and right leg weakness.  Last seen well was last night.  Patient's wife  woke him this morning around 1030, however he was not particularly responsive and coherent.  When she returned at 115, she noticed that he had not been out of bed.  When she woke him up, she noted that he was confused, had difficulty speaking and had weakness in his arms bilaterally.  EMS was called.  They noted no slurred speech, however they found that he was weak in his arms and right leg.  No fevers, chills, nausea, vomiting.  Of note, the patient recently started baclofen by [facility] yesterday and had his first dose last night  Emergency Department Course: CT of the head showed stable chronic small vessel changes with cortical atrophy, however no true cranial process.  Neurology was consulted and observation was recommended with repeat CT scan as the patient has a spinal cord stimulator  Review of Systems:   Pt denies any fevers, chills, nausea, vomiting, diarrhea, constipation, abdominal pain,  shortness of breath, dyspnea on exertion, orthopnea, cough, wheezing, palpitations, headache, vision changes, lightheadedness, dizziness, melena, rectal bleeding.  Review of systems are otherwise negative   Hospital Course:    Brief Summary:- 74 y.o.malewith a history of ankylosing spondylitis, chronic pain, peripheral axonal neuropathy, gait instability with multiple falls, hypertension remote history of alcohol abuse admitted on 09/27/2019 with acute encephalopathy, speech disturbance and weakness after starting baclofen the previous evening  A/p 1)Acute Metabolic Encephalopathy-----suspect baclofen induced  --- Stroke work-up essentially negative  --CT head on 09/27/2019 and CT head on 09/29/2018 without acute findings  --Unable to do MRI due to neuro stimulator in situ -Telemetry neurology consult appreciated recommends completing inpatient stroke work-up prior to discharge home -Carotid artery Dopplers without hemodynamically significant stenosis -Echo with preserved EF of 60 to 65% -PT eval appreciated recommends home health physical therapy -LDL is 103, HDL is 39, A1c is 5.4 -Aspirin and Lipitor as ordered -Stopped baclofen --Neurologically and cognitively as per patient's wife patient appears to be back to baseline today  2)HTN--BP medications adjusted, specifically amlodipine has been decreased to 5 mg daily and losartan has been decreased to 25 mg daily  3) generalized weakness/ambulatory dysfunction/recurrent falls-----patient with chronic pain on multiple sedative agents, PT eval appreciated recommends home health PT  4) chronic pain syndrome--- patient will follow up with Dr. Gerilyn Pilgrim , as well as neuro spine specialist at [facility]  Disposition----Home with wife and home health services   -Anticipate home with [facility] in am   Code Status : FULL  Family Communication:   (Discussed with wife  Consults  :  na  Discharge Condition: stable  Follow UP  Follow-up  Information    Beryle Beams, MD. Schedule an appointment as soon as possible for a visit in 1 day(s).   Specialty: Neurology Contact information: [address] [place] [address] [phone]           Diet and Activity recommendation:  As advised  Discharge Instructions    Discharge Instructions    Call MD for:  difficulty breathing, headache or visual disturbances   Complete by: As directed    Call MD for:  persistant dizziness or light-headedness   Complete by: As directed    Call MD for:  persistant nausea and vomiting   Complete by: As directed    Call MD for:  severe uncontrolled pain   Complete by: As directed    Call MD for:  temperature >100.4   Complete by: As directed    Diet - low sodium heart healthy  Complete by: As directed    Discharge instructions   Complete by: As directed    1) amlodipine has been decreased to 5 mg daily, losartan has been decreased to 25 mg daily 2) please follow-up with Dr. Merlene Laughter for ongoing pain management 3) avoid baclofen   Increase activity slowly   Complete by: As directed         Discharge Medications     Allergies as of 09/29/2019      Reactions   Codeine Nausea Only      Medication List    STOP taking these medications   baclofen 10 MG tablet Commonly known as: LIORESAL   cetirizine 10 MG tablet Commonly known as: ZYRTEC   methocarbamol 500 MG tablet Commonly known as: ROBAXIN     TAKE these medications   acetaminophen 325 MG tablet Commonly known as: TYLENOL Take 2 tablets (650 mg total) by mouth every 4 (four) hours as needed for mild pain (or temp > 37.5 C (99.5 F)).   amLODipine 5 MG tablet Commonly known as: NORVASC Take 1 tablet (5 mg total) by mouth daily. What changed:   medication strength  how much to take  when to take this   cyclobenzaprine 5 MG tablet Commonly known as: FLEXERIL Take 5 mg by mouth 3 (three) times daily as needed.   DULoxetine 30 MG  capsule Commonly known as: CYMBALTA Take 60 mg by mouth daily.   losartan 25 MG tablet Commonly known as: COZAAR Take 1 tablet (25 mg total) by mouth daily. What changed:   medication strength  how much to take  when to take this   meloxicam 7.5 MG tablet Commonly known as: MOBIC Take 1 tablet (7.5 mg total) by mouth daily.   oxyCODONE-acetaminophen 10-325 MG tablet Commonly known as: PERCOCET Take 1 tablet by mouth every 6 (six) hours as needed for pain.   pantoprazole 40 MG tablet Commonly known as: PROTONIX 1 po 30 mins prior to your first meal   pregabalin 150 MG capsule Commonly known as: LYRICA Take 150 mg by mouth 3 (three) times daily.   senna-docusate 8.6-50 MG tablet Commonly known as: Senokot-S Take 2 tablets by mouth at bedtime.   tamsulosin 0.4 MG Caps capsule Commonly known as: FLOMAX Take 1 capsule (0.4 mg total) by mouth at bedtime.       Major procedures and Radiology Reports - PLEASE review detailed and final reports for all details, in brief -   CT HEAD WO CONTRAST  Result Date: 09/29/2019 CLINICAL DATA:  Patient with generalized weakness and unsteady gait. EXAM: CT HEAD WITHOUT CONTRAST TECHNIQUE: Contiguous axial images were obtained from the base of the skull through the vertex without intravenous contrast. COMPARISON:  Brain CT 09/27/2019 FINDINGS: Brain: Ventricles and sulci are prominent compatible with atrophy. Periventricular and subcortical white matter hypodensities compatible with chronic microvascular ischemic changes. No evidence for acute cortically based infarct, intracranial hemorrhage, mass lesion or mass-effect. Evaluation of the posterior fossa limited secondary to streak artifact. Vascular: Unremarkable. Skull: Postsurgical changes compatible with craniocervical fusion. Sinuses/Orbits: Paranasal sinuses are well aerated. Mastoid air cells are unremarkable. Visualized orbits are unremarkable. Other: None. IMPRESSION: 1. No acute  intracranial process. 2. Atrophy and chronic microvascular ischemic changes. Electronically Signed   By: Lovey Newcomer M.D.   On: 09/29/2019 10:46   CT Head Wo Contrast  Result Date: 09/27/2019 CLINICAL DATA:  Altered level of consciousness, slurred speech, confusion EXAM: CT HEAD WITHOUT CONTRAST TECHNIQUE: Contiguous axial images were  obtained from the base of the skull through the vertex without intravenous contrast. COMPARISON:  07/24/2018 FINDINGS: Brain: Limited evaluation of the posterior fossa due to streak artifact from occipital hardware. No acute infarct or hemorrhage. There are chronic small vessel ischemic changes within the periventricular and subcortical white matter. Stable cortical atrophy with ex vacuo dilatation of the lateral ventricles. Midline structures are otherwise unremarkable. No acute extra-axial fluid collections. No mass effect. Vascular: No hyperdense vessel or unexpected calcification. Skull: Normal. Negative for fracture or focal lesion. Stable postsurgical changes from craniocervical fusion. Sinuses/Orbits: No acute finding. Other: None IMPRESSION: 1. Stable chronic small vessel ischemic changes and cortical atrophy. No acute intracranial process. Electronically Signed   By: Sharlet Salina M.D.   On: 09/27/2019 16:10   US Carotid Bilateral (at [facility] and [facility] only)  Result Date: 09/28/2019 CLINICAL DATA:  Recent TIA.  Hypertension, previous cervical fusion EXAM: BILATERAL CAROTID DUPLEX ULTRASOUND TECHNIQUE: Wallace Cullens scale imaging, color Doppler and duplex ultrasound were performed of bilateral carotid and vertebral arteries in the neck. Technologist describes technically difficult study secondary to patient being scanned while in chair, neck in fixed position. COMPARISON:  None. FINDINGS: Criteria: Quantification of carotid stenosis is based on velocity parameters that correlate the residual internal carotid diameter with NASCET-based stenosis levels, using the diameter of the distal  internal carotid lumen as the denominator for stenosis measurement. The following velocity measurements were obtained: RIGHT ICA: 60/13 cm/sec CCA: 68/10 cm/sec SYSTOLIC ICA/CCA RATIO:  0.9 ECA: 75 cm/sec LEFT ICA: 46/9 cm/sec CCA: 64/6 cm/sec SYSTOLIC ICA/CCA RATIO:  0.7 ECA: 95 cm/sec RIGHT CAROTID ARTERY: Moderate tortuosity. Eccentric calcified plaque in the bulb and proximal ICA without high-grade stenosis. Normal waveforms and color Doppler signal. RIGHT VERTEBRAL ARTERY:  Normal flow direction and waveform. LEFT CAROTID ARTERY: Mild tortuosity. Mild plaque in the bulb and ICA origin. No high-grade stenosis. Normal waveforms and color Doppler signal. LEFT VERTEBRAL ARTERY:  Normal flow direction and waveform. IMPRESSION: 1. Bilateral carotid bifurcation plaque resulting in less than 50% diameter ICA stenosis. 2. Antegrade bilateral vertebral arterial flow. Electronically Signed   By: Corlis Leak M.D.   On: 09/28/2019 11:31   ECHOCARDIOGRAM COMPLETE  Result Date: 09/28/2019    ECHOCARDIOGRAM REPORT   Patient Name:   [name] Date of Exam: 09/28/2019 Medical Rec #:  [id number]            Height:       72.0 in Accession #:    [id number]           Weight:       234.1 lb Date of Birth:  [birth year]            BSA:          2.278 m Patient Age:    74 years             BP:           144/71 mmHg Patient Gender: M                    HR:           73 bpm. [facility] Procedure: 2D Echo, Cardiac Doppler, Color Doppler and Intracardiac            Opacification Agent Indications:    TIA 435.9  History:        Patient has no prior history of Echocardiogram examinations.  Risk Factors:Hypertension and Non-Smoker.  Sonographer:    Renella CunasJulia Swaim RDCS Referring Phys: [phone] JACOB J STINSON  Sonographer Comments: Technically challenging study due to limited acoustic windows. IMPRESSIONS  1. Left ventricular ejection fraction, by estimation, is 60 to 65%. The left ventricle has normal  function. The left ventricle has no regional wall motion abnormalities. Left ventricular diastolic parameters are indeterminate.  2. Right ventricular systolic function is normal. The right ventricular size is mildly enlarged.  3. The mitral valve is normal in structure. No evidence of mitral valve regurgitation.  4. The aortic valve is tricuspid. Aortic valve regurgitation is not visualized. No aortic stenosis is present. FINDINGS  Left Ventricle: Left ventricular ejection fraction, by estimation, is 60 to 65%. The left ventricle has normal function. The left ventricle has no regional wall motion abnormalities. Definity contrast agent was given IV to delineate the left ventricular  endocardial borders. The left ventricular internal cavity size was normal in size. There is no left ventricular hypertrophy. Left ventricular diastolic parameters are indeterminate. Right Ventricle: The right ventricular size is mildly enlarged. Right vetricular wall thickness was not assessed. Right ventricular systolic function is normal. Left Atrium: Left atrial size was normal in size. Right Atrium: Right atrial size was normal in size. Pericardium: Trivial pericardial effusion is present. Presence of pericardial fat pad. Mitral Valve: The mitral valve is normal in structure. No evidence of mitral valve regurgitation. Tricuspid Valve: The tricuspid valve is normal in structure. Tricuspid valve regurgitation is trivial. Aortic Valve: The aortic valve is tricuspid. Aortic valve regurgitation is not visualized. No aortic stenosis is present. Pulmonic Valve: The pulmonic valve was not well visualized. Pulmonic valve regurgitation is not visualized. Aorta: The aortic root is normal in size and structure. Venous: The inferior vena cava was not well visualized. IAS/Shunts: The interatrial septum was not well visualized.  LEFT VENTRICLE PLAX 2D LVIDd:         4.94 cm  Diastology LVIDs:         3.61 cm  LV e' lateral:   7.83 cm/s LV PW:          0.93 cm  LV E/e' lateral: 7.7 LV IVS:        0.93 cm  LV e' medial:    4.68 cm/s LVOT diam:     2.40 cm  LV E/e' medial:  12.9 LV SV:         63 LV SV Index:   28 LVOT Area:     4.52 cm  RIGHT VENTRICLE RV S prime:     8.16 cm/s TAPSE (M-mode): 2.0 cm LEFT ATRIUM             Index LA diam:        4.30 cm 1.89 cm/m LA Vol (A2C):   59.2 ml 25.99 ml/m LA Vol (A4C):   71.0 ml 31.17 ml/m LA Biplane Vol: 65.1 ml 28.58 ml/m  AORTIC VALVE LVOT Vmax:   72.50 cm/s LVOT Vmean:  52.400 cm/s LVOT VTI:    0.139 m  AORTA Ao Root diam: 3.80 cm MITRAL VALVE MV Area (PHT): 2.93 cm    SHUNTS MV Decel Time: 259 msec    Systemic VTI:  0.14 m MV E velocity: 60.40 cm/s  Systemic Diam: 2.40 cm MV A velocity: 65.60 cm/s MV E/A ratio:  0.92 Epifanio Lescheshristopher Schumann MD Electronically signed by Epifanio Lescheshristopher Schumann MD Signature Date/Time: 09/28/2019/9:24:31 PM    Final   Micro Results   No results found for this  or any previous visit (from the past 240 hour(s)).  Today   Subjective    [name] today has no new complaints -Ambulated with walker and minimum assist -Wife at bedside and states that patient is back to baseline from a cognitive standpoint          Patient has been seen and examined prior to discharge   Objective   Blood pressure 139/76, pulse 75, temperature 98.6 F (37 C), temperature source Oral, resp. rate 20, height 6' (1.829 m), weight 106.2 kg, SpO2 98 %.  No intake or output data in the 24 hours ending 09/29/19 1353  Exam Gen:- Awake Alert, no acute distress  HEENT:- [place].AT, No sclera icterus Neck-Supple Neck,No JVD,.  Lungs-  CTAB , good air movement bilaterally  CV- S1, S2 normal, regular Abd-  +ve B.Sounds, Abd Soft, No tenderness,   = Extremity/Skin:- No  edema,   good pulses Psych-affect is appropriate, oriented x3 Neuro-generalized weakness and some gait concerns which are not new, no new focal deficits, no tremors   Data Review   CBC w Diff:  Lab Results  Component  Value Date   WBC 5.9 09/27/2019   HGB 13.3 09/27/2019   HCT 39.8 09/27/2019   HCT 32.5 (L) 05/08/2018   PLT 241 09/27/2019   LYMPHOPCT 23 09/27/2019   MONOPCT 6 09/27/2019   EOSPCT 1 09/27/2019   BASOPCT 1 09/27/2019    CMP:  Lab Results  Component Value Date   NA 142 09/29/2019   K 4.6 09/29/2019   CL 106 09/29/2019   CO2 25 09/29/2019   BUN 29 (H) 09/29/2019   CREATININE 1.59 (H) 09/29/2019   PROT 7.9 09/27/2019   PROT 7.6 05/17/2018   ALBUMIN 4.2 09/27/2019   BILITOT 0.7 09/27/2019   ALKPHOS 121 09/27/2019   AST 27 09/27/2019   ALT 42 09/27/2019  .   Total Discharge time is about 33 minutes  Shon Hale M.D on 09/29/2019 at 1:53 PM  Go to [email or web address] -  for contact info  [facility] - Office  [phone]

## 2019-09-29 NOTE — Discharge Instructions (Signed)
1) amlodipine has been decreased to 5 mg daily, losartan has been decreased to 25 mg daily 2) please follow-up with Dr. Gerilyn Pilgrim for ongoing pain management 3) avoid baclofen

## 2019-09-29 NOTE — Progress Notes (Signed)
IV removed, 2x2 gauze and paper tape applied to site, patient tolerated well. VSS.  Reviewed AVS with patient and patient's wife, but verbalized understanding.  Patient taken to lobby via wheelchair and transported home by his wife.

## 2019-09-30 NOTE — Progress Notes (Signed)
CSW provided [name] with [facility] referral. CSW awaiting to hear back to see if he can accept. [name] states that patient has had [facility] in the past.   [name] [name] Transitions of Care  Clinical Social Worker  Ph: [phone]

## 2019-10-07 ENCOUNTER — Encounter (HOSPITAL_COMMUNITY): Payer: Self-pay | Admitting: Emergency Medicine

## 2019-10-07 ENCOUNTER — Other Ambulatory Visit: Payer: Self-pay

## 2019-10-07 ENCOUNTER — Emergency Department (HOSPITAL_COMMUNITY)
Admission: EM | Admit: 2019-10-07 | Discharge: 2019-10-07 | Disposition: A | Discharge status: Home / Self Care | Payer: Medicare Other | Attending: Emergency Medicine | Admitting: Emergency Medicine

## 2019-10-07 ENCOUNTER — Emergency Department (HOSPITAL_COMMUNITY): Payer: Medicare Other

## 2019-10-07 DIAGNOSIS — W19XXXA Unspecified fall, initial encounter: Secondary | ICD-10-CM | POA: Diagnosis not present

## 2019-10-07 DIAGNOSIS — I10 Essential (primary) hypertension: Secondary | ICD-10-CM | POA: Insufficient documentation

## 2019-10-07 DIAGNOSIS — R079 Chest pain, unspecified: Secondary | ICD-10-CM | POA: Diagnosis present

## 2019-10-07 DIAGNOSIS — Y999 Unspecified external cause status: Secondary | ICD-10-CM | POA: Insufficient documentation

## 2019-10-07 DIAGNOSIS — M549 Dorsalgia, unspecified: Secondary | ICD-10-CM

## 2019-10-07 DIAGNOSIS — Z79899 Other long term (current) drug therapy: Secondary | ICD-10-CM | POA: Diagnosis not present

## 2019-10-07 DIAGNOSIS — M5489 Other dorsalgia: Secondary | ICD-10-CM | POA: Diagnosis not present

## 2019-10-07 DIAGNOSIS — S22069A Unspecified fracture of T7-T8 vertebra, initial encounter for closed fracture: Secondary | ICD-10-CM | POA: Diagnosis not present

## 2019-10-07 DIAGNOSIS — Y9289 Other specified places as the place of occurrence of the external cause: Secondary | ICD-10-CM | POA: Insufficient documentation

## 2019-10-07 DIAGNOSIS — S3991XA Unspecified injury of abdomen, initial encounter: Secondary | ICD-10-CM | POA: Insufficient documentation

## 2019-10-07 DIAGNOSIS — Y9389 Activity, other specified: Secondary | ICD-10-CM | POA: Diagnosis not present

## 2019-10-07 LAB — COMPREHENSIVE METABOLIC PANEL
ALT: 55 U/L — ABNORMAL HIGH (ref 0–44)
AST: 25 U/L (ref 15–41)
Albumin: 4.3 g/dL (ref 3.5–5.0)
Alkaline Phosphatase: 156 U/L — ABNORMAL HIGH (ref 38–126)
Anion gap: 11 (ref 5–15)
BUN: 29 mg/dL — ABNORMAL HIGH (ref 8–23)
CO2: 28 mmol/L (ref 22–32)
Calcium: 9.5 mg/dL (ref 8.9–10.3)
Chloride: 103 mmol/L (ref 98–111)
Creatinine, Ser: 1.6 mg/dL — ABNORMAL HIGH (ref 0.61–1.24)
GFR calc Af Amer: 49 mL/min — ABNORMAL LOW (ref >60)
GFR calc non Af Amer: 42 mL/min — ABNORMAL LOW (ref >60)
Glucose, Bld: 122 mg/dL — ABNORMAL HIGH (ref 70–99)
Potassium: 4.5 mmol/L (ref 3.5–5.1)
Sodium: 142 mmol/L (ref 135–145)
Total Bilirubin: 1 mg/dL (ref 0.3–1.2)
Total Protein: 8.1 g/dL (ref 6.5–8.1)

## 2019-10-07 LAB — CBC WITH DIFFERENTIAL/PLATELET
Abs Immature Granulocytes: 0.07 10*3/uL (ref 0.00–0.07)
Basophils Absolute: 0.1 10*3/uL (ref 0.0–0.1)
Basophils Relative: 1 %
Eosinophils Absolute: 0.2 10*3/uL (ref 0.0–0.5)
Eosinophils Relative: 2 %
HCT: 45.1 % (ref 39.0–52.0)
Hemoglobin: 14.7 g/dL (ref 13.0–17.0)
Immature Granulocytes: 1 %
Lymphocytes Relative: 18 %
Lymphs Abs: 1.9 10*3/uL (ref 0.7–4.0)
MCH: 30.9 pg (ref 26.0–34.0)
MCHC: 32.6 g/dL (ref 30.0–36.0)
MCV: 94.9 fL (ref 80.0–100.0)
Monocytes Absolute: 0.8 10*3/uL (ref 0.1–1.0)
Monocytes Relative: 7 %
Neutro Abs: 7.4 10*3/uL (ref 1.7–7.7)
Neutrophils Relative %: 71 %
Platelets: 222 10*3/uL (ref 150–400)
RBC: 4.75 MIL/uL (ref 4.22–5.81)
RDW: 12.2 % (ref 11.5–15.5)
WBC: 10.4 10*3/uL (ref 4.0–10.5)
nRBC: 0 % (ref 0.0–0.2)

## 2019-10-07 LAB — TYPE AND SCREEN
ABO/RH(D): A POS
Antibody Screen: NEGATIVE

## 2019-10-07 MED ORDER — IOHEXOL 300 MG/ML  SOLN
100.0000 mL | Freq: Once | INTRAMUSCULAR | Status: AC | PRN
  Administered 2019-10-07: 80 mL via INTRAVENOUS

## 2019-10-07 MED ORDER — HYDROMORPHONE HCL 1 MG/ML IJ SOLN
0.5000 mg | Freq: Once | INTRAMUSCULAR | Status: AC
  Administered 2019-10-07: 0.5 mg via INTRAVENOUS
  Filled 2019-10-07: qty 1

## 2019-10-07 MED ORDER — SODIUM CHLORIDE 0.9 % IV BOLUS
500.0000 mL | Freq: Once | INTRAVENOUS | Status: AC
  Administered 2019-10-07: 500 mL via INTRAVENOUS

## 2019-10-07 MED ORDER — MORPHINE SULFATE (PF) 4 MG/ML IV SOLN
6.0000 mg | Freq: Once | INTRAVENOUS | Status: AC
  Administered 2019-10-07: 6 mg via INTRAVENOUS
  Filled 2019-10-07: qty 2

## 2019-10-07 MED ORDER — ONDANSETRON HCL 4 MG/2ML IJ SOLN
4.0000 mg | Freq: Once | INTRAMUSCULAR | Status: AC
  Administered 2019-10-07: 4 mg via INTRAVENOUS
  Filled 2019-10-07: qty 2

## 2019-10-07 MED ORDER — SODIUM CHLORIDE (PF) 0.9 % IJ SOLN
INTRAMUSCULAR | Status: AC
  Filled 2019-10-07: qty 50

## 2019-10-07 MED ORDER — SODIUM CHLORIDE 0.9 % IV SOLN: INTRAVENOUS | Status: DC

## 2019-10-07 MED ORDER — OXYCODONE-ACETAMINOPHEN 5-325 MG PO TABS
1.0000 | ORAL_TABLET | Freq: Once | ORAL | Status: AC
  Administered 2019-10-07: 1 via ORAL
  Filled 2019-10-07: qty 1

## 2019-10-07 NOTE — ED Provider Notes (Signed)
[facility] [place] DEPT Provider Note   CSN: [id number] Arrival date & time: 10/07/19  1310     History Chief Complaint  Patient presents with  . Chest Pain    left     [name] is a 74 y.o. male.  74 year old male who presents with left chest pain since suffering a fall about a month ago.  Patient does have a history of chronic pain and does have a neurostimulator due to ankylosing spondylitis.  No anginal qualities to this.  Pain is worse taking a deep breath or moving.  Some associated dyspnea due to his discomfort.  Has been seen for this by his physician had been referred to pain management.  Placed on baclofen and prednisone without relief of his symptoms.  Was hospitalized recently for possible CVA.  That was ruled out.  Patient continues to note discomfort without cough or congestion.  No leg pain or swelling.  Has had some associated nausea and vomiting due to the pain.  Unresponsive to his home medications.        Past Medical History:  Diagnosis Date  . 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 Ankylosing spondylitis of multiple sites in spine ([place])   . Chronic pain   . Chronically on opiate therapy    Pain Management d/c opiates 06/2018  . 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 pt has note from [facility] indicating difficulty  . First degree AV block    chronic  . Gait instability   . Hypertension   . Multiple falls   . OSA (obstructive sleep apnea)   . Pain management   . Peripheral axonal neuropathy   . Peripheral neuropathy 06/06/2018  . Tremor     Patient Active Problem List   Diagnosis Date Noted  . Weakness 09/27/2019  . Abnormal CT scan   . Nausea and vomiting   . Opiate use   . Diarrhea   . Compression fracture of T10 vertebra ([place]) 07/25/2018  . Duodenitis 07/25/2018  . Aspiration pneumonia of both lower lobes due to gastric secretions ([place])  07/21/2018  . Acute respiratory failure with hypoxia ([place]) 07/21/2018  . Acute kidney injury ([place]) 07/20/2018  . Dehydration 07/20/2018  . Opiate overdose ([place]) 07/20/2018  . Peripheral neuropathy 06/06/2018  . Acute encephalopathy 05/09/2018  . Multiple falls 05/08/2018  . 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 OBSTRUCTIVE SLEEP APNEA 08/20/2007  . Chronic pain syndrome 07/30/2007  . Essential hypertension 07/30/2007  . Ankylosing spondylitis ([place]) 07/30/2007    Past Surgical History:  Procedure Laterality Date  . ANKLE SURGERY Right    orif  . BACK SURGERY    . BIOPSY  11/05/2018   Procedure: BIOPSY;  Surgeon: Danie Binder, MD;  Location: [facility];  Service: Endoscopy;;  . CHOLECYSTECTOMY    . ESOPHAGOGASTRODUODENOSCOPY (EGD) WITH PROPOFOL N/A 11/05/2018   Procedure: ESOPHAGOGASTRODUODENOSCOPY (EGD) WITH PROPOFOL;  Surgeon: Danie Binder, MD;  Location: [facility];  Service: Endoscopy;  Laterality: N/A;  10:30am-rescheduled to 4/7 @ 7:30am per office  . JOINT REPLACEMENT Bilateral    knees  . NECK SURGERY     cervical fusion  . SPINAL CORD STIMULATOR INSERTION  2017  . TOTAL KNEE ARTHROPLASTY         Family History  Problem Relation Age of Onset  . Heart disease Mother     Social History   Tobacco Use  .  Smoking status: Never Smoker  . Smokeless tobacco: Never Used  Substance Use Topics  . Alcohol use: Not Currently  . Drug use: No    Home Medications Prior to Admission medications   Medication Sig Start Date End Date Taking? Authorizing Provider  acetaminophen (TYLENOL) 325 MG tablet Take 2 tablets (650 mg total) by mouth every 4 (four) hours as needed for mild pain (or temp > 37.5 C (99.5 F)). 09/29/19   Emokpae, Courage, MD  amLODipine (NORVASC) 5 MG tablet Take 1 tablet (5 mg total) by mouth daily. 09/29/19   Shon Hale, MD  cyclobenzaprine (FLEXERIL) 5 MG tablet Take 5 mg by mouth 3 (three) times daily as needed. 09/12/19   [provider]   DULoxetine (CYMBALTA) 30 MG capsule Take 60 mg by mouth daily.    [provider]  losartan (COZAAR) 25 MG tablet Take 1 tablet (25 mg total) by mouth daily. 09/29/19   Shon Hale, MD  meloxicam (MOBIC) 7.5 MG tablet Take 1 tablet (7.5 mg total) by mouth daily. 09/29/19   Shon Hale, MD  oxyCODONE-acetaminophen (PERCOCET) 10-325 MG tablet Take 1 tablet by mouth every 6 (six) hours as needed for pain. 09/09/19   [provider]  pantoprazole (PROTONIX) 40 MG tablet 1 po 30 mins prior to your first meal 11/05/18   Fields, Sandi L, MD  pregabalin (LYRICA) 150 MG capsule Take 150 mg by mouth 3 (three) times daily.    [provider]  senna-docusate (SENOKOT-S) 8.6-50 MG tablet Take 2 tablets by mouth at bedtime. 09/29/19 09/28/20  Shon Hale, MD  tamsulosin (FLOMAX) 0.4 MG CAPS capsule Take 1 capsule (0.4 mg total) by mouth at bedtime. 09/29/19   Shon Hale, MD    Allergies    Codeine  Review of Systems   Review of Systems  All other systems reviewed and are negative.   Physical Exam Updated Vital Signs BP (!) 148/100 (BP Location: Left Arm)   Pulse 80   Temp 97.8 F (36.6 C) (Oral)   Resp 18   SpO2 96%   Physical Exam Vitals and nursing note reviewed.  Constitutional:      General: He is not in acute distress.    Appearance: Normal appearance. He is well-developed. He is not toxic-appearing.  HENT:     Head: Normocephalic and atraumatic.  Eyes:     General: Lids are normal.     Conjunctiva/sclera: Conjunctivae normal.     Pupils: Pupils are equal, round, and reactive to light.  Neck:     Thyroid: No thyroid mass.     Trachea: No tracheal deviation.  Cardiovascular:     Rate and Rhythm: Normal rate and regular rhythm.     Heart sounds: Normal heart sounds. No murmur. No gallop.   Pulmonary:     Effort: Pulmonary effort is normal. No respiratory distress.     Breath sounds: Normal breath sounds. No stridor. No decreased breath  sounds, wheezing, rhonchi or rales.  Abdominal:     General: Bowel sounds are normal. There is no distension.     Palpations: Abdomen is soft.     Tenderness: There is no abdominal tenderness. There is no rebound.  Musculoskeletal:        General: No tenderness. Normal range of motion.     Cervical back: Normal range of motion and neck supple.  Skin:    General: Skin is warm and dry.     Findings: No abrasion or rash.  Neurological:  Mental Status: He is alert and oriented to person, place, and time.     GCS: GCS eye subscore is 4. GCS verbal subscore is 5. GCS motor subscore is 6.     Cranial Nerves: No cranial nerve deficit.     Sensory: No sensory deficit.  Psychiatric:        Speech: Speech normal.        Behavior: Behavior normal.     ED Results / Procedures / Treatments   Labs (all labs ordered are listed, but only abnormal results are displayed) Labs Reviewed - No data to display  EKG None  Radiology DG Ribs Unilateral W/Chest Left  Result Date: 10/07/2019 CLINICAL DATA:  Pain following fall EXAM: LEFT RIBS AND CHEST - 3+ VIEW COMPARISON:  July 21, 2018 FINDINGS: Frontal chest as well as oblique and cone-down rib images were obtained. There is mild left base atelectasis. The lungs elsewhere are clear. Heart size and pulmonary vascularity are normal. No adenopathy. Thoracic stimulator lead tips are in the midthoracic region. A linear radiopaque foreign body is either within or overlying the right upper hemithorax. No evident pneumothorax or pleural effusion. There are old healed fractures of the posterolateral left sixth, seventh, and eighth ribs. No acute appearing rib fracture is demonstrable. IMPRESSION: Prior healed rib fractures on the left noted. Left base atelectasis. No edema or airspace opacity. Cardiac silhouette within normal limits. Linear radiopaque foreign body in or overlying the right upper hemithorax. Stimulator leads in midthoracic region. Electronically  Signed   By: Bretta Bang III M.D.   On: 10/07/2019 13:53    Procedures Procedures (including critical care time)  Medications Ordered in ED Medications  sodium chloride 0.9 % bolus 500 mL (has no administration in time range)  0.9 %  sodium chloride infusion (has no administration in time range)  morphine 4 MG/ML injection 6 mg (has no administration in time range)  ondansetron (ZOFRAN) injection 4 mg (has no administration in time range)    ED Course  I have reviewed the triage vital signs and the nursing notes.  Pertinent labs & imaging results that were available during my care of the patient were reviewed by me and considered in my medical decision making (see chart for details).    MDM Rules/Calculators/A&P                      Patient medicated for pain here.  Will CT chest abdomen and pelvis for possibilities such as retroperitoneal hematoma.  Labs are pending in case signout to Dr. Rodena Medin Final Clinical Impression(s) / ED Diagnoses Final diagnoses:  None    Rx / DC Orders ED Discharge Orders    None       Lorre Nick, MD 10/07/19 1514

## 2019-10-07 NOTE — Discharge Instructions (Addendum)
Please return for any problem.  Follow-up with your regular care providers including specifically [facility] and your chronic pain management clinic.  Use the TLSO brace as instructed.

## 2019-10-07 NOTE — ED Triage Notes (Signed)
Pt reports that he fell about month ago in driveway. Reports that still having left rib pains and back pains. Hurts when takes a deep breath.

## 2019-10-07 NOTE — ED Provider Notes (Signed)
Patient seen after prior ED provider. CT imaging results discussed with both Dr. Carolynne Edouard of General Surgery and with the patient's already established spine specialist at [facility].  There is no indication at this time for further intervention for the patient's reported possible splenic injury.  Patient's reported trauma was at least 1 month prior to today.  No active bleeding noted.  Labs otherwise without significant abnormality.  Patient's spine specialist (Ly) at [facility] agree with plan for TLSO brace and additional pain medication.  Patient and his family desire discharge.  They are aware the need for close follow-up both with their already established spine specialist and also with their pain management clinic as outpatient.    Wynetta Fines, MD 10/07/19 1911

## 2019-10-07 NOTE — ED Notes (Signed)
Freida Busman MD at bedside. Pt moved to room 19

## 2019-10-15 ENCOUNTER — Encounter: Payer: Self-pay | Admitting: Gastroenterology

## 2020-01-22 IMAGING — CT CT ABD-PELV W/ CM
2 of 5 series · 16 of 46 positions shown, 18 images · IV contrast (Isovue)
Comparison: None.

CLINICAL DATA: Generalized weakness with loss of appetite.
Abdominal pain.

EXAM:
CT ABDOMEN AND PELVIS WITH CONTRAST
TECHNIQUE: Multidetector CT imaging of the abdomen and pelvis was performed
using the standard protocol following bolus administration of
intravenous contrast.
CONTRAST:  100mL 9A2UON-ODD IOPAMIDOL (9A2UON-ODD) INJECTION 61%

[Series 2: axial st · axial · 0.91mm/px · z∈[+749,+1209]mm · 13 of 104 slices shown, 15 images]
[im 6/104  soft-tissue]
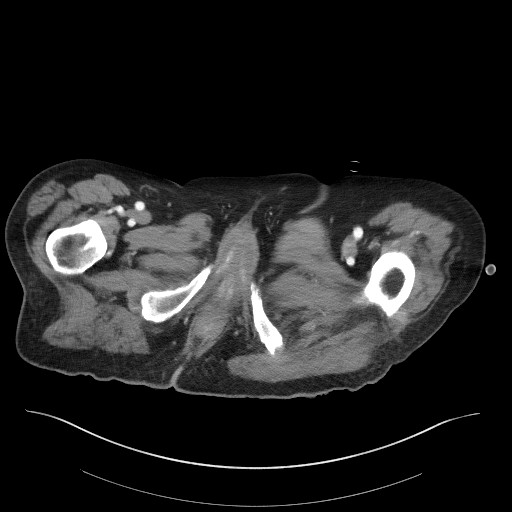
[im 6/104  bone]
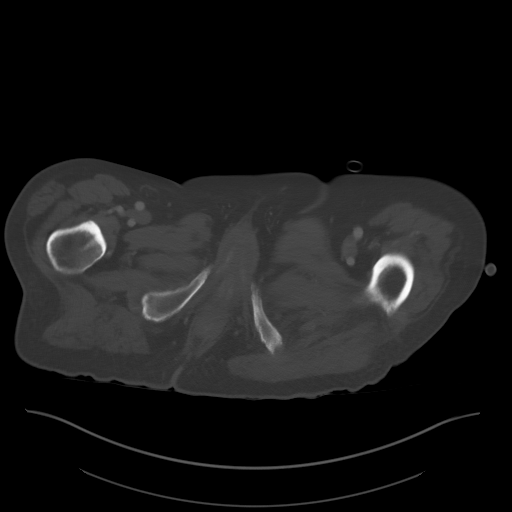
[im 12/104  soft-tissue]
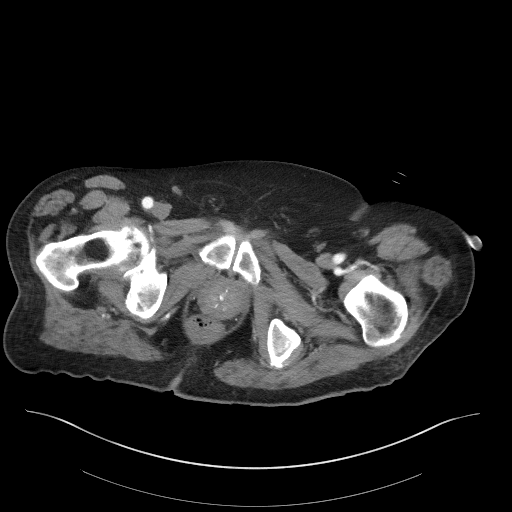
[im 23/104  soft-tissue]
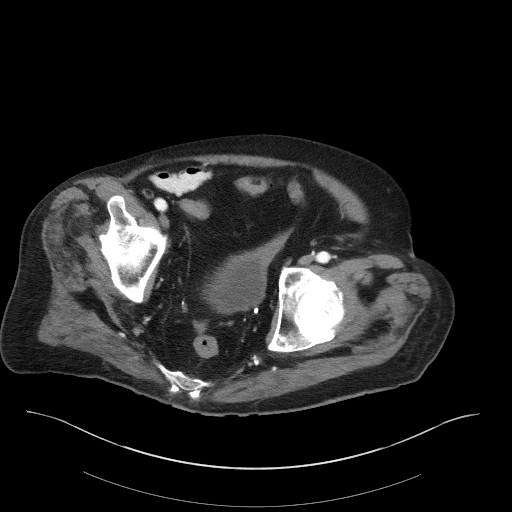
[im 29/104  soft-tissue]
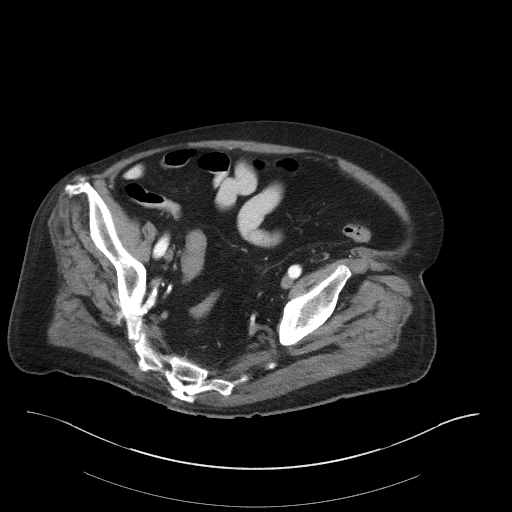
[im 35/104  soft-tissue]
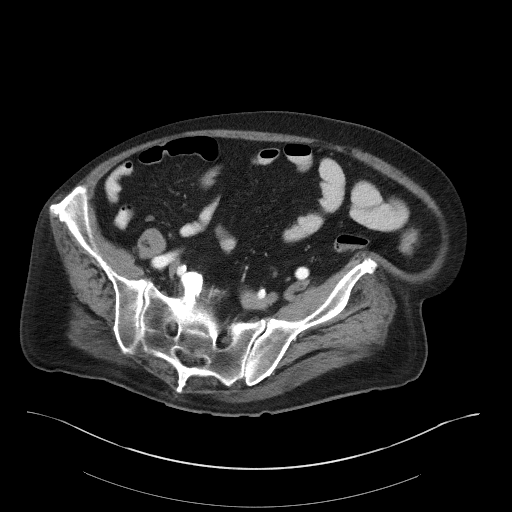
[im 46/104  soft-tissue]
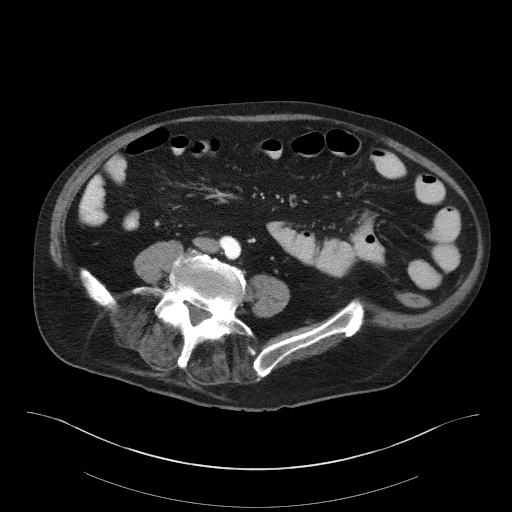
[im 52/104  soft-tissue]
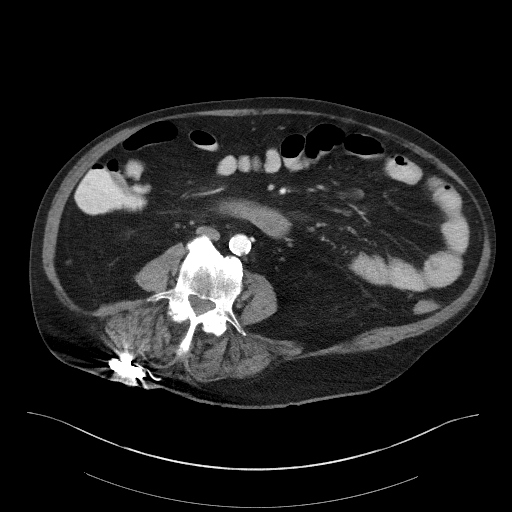
[im 58/104  soft-tissue]
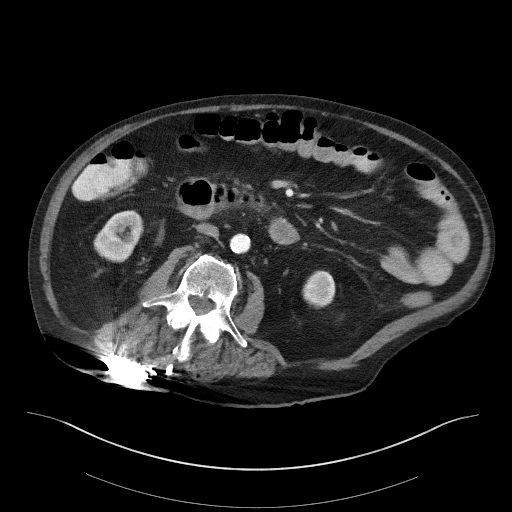
[im 69/104  soft-tissue]
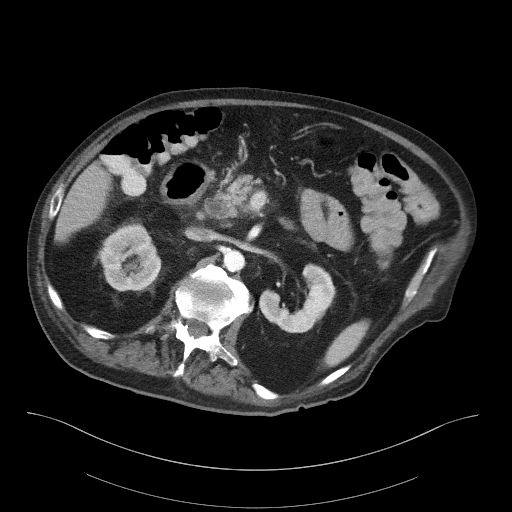
[im 69/104  bone]
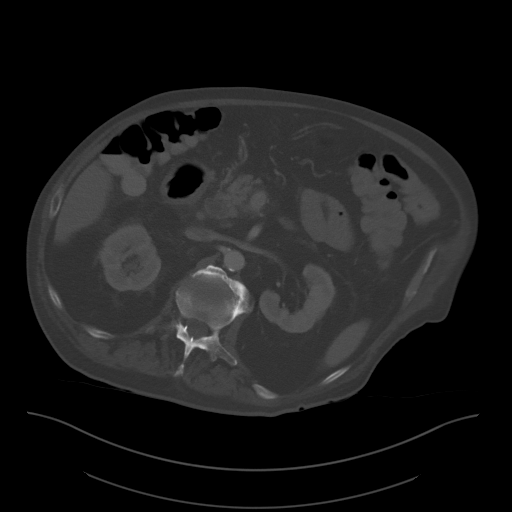
[im 75/104  soft-tissue]
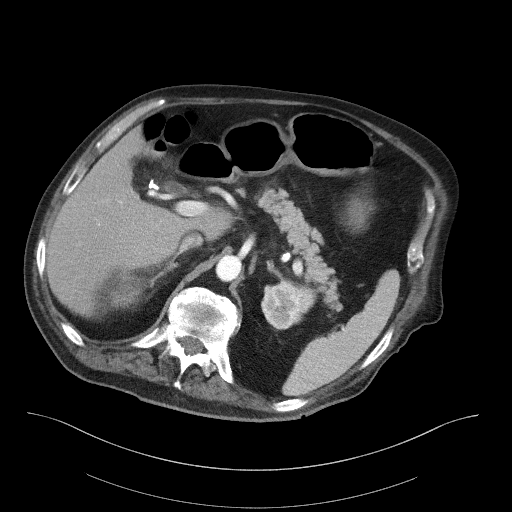
[im 81/104  soft-tissue]
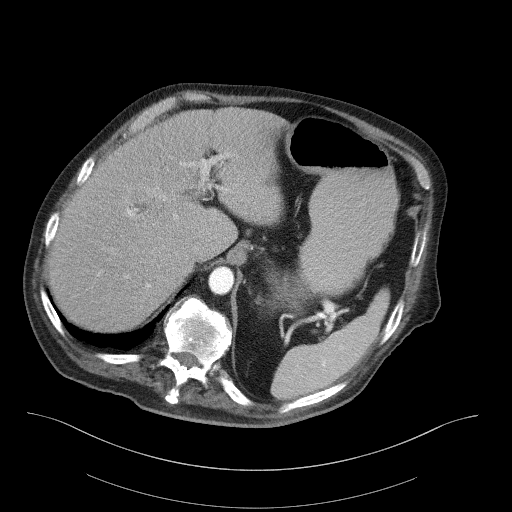
[im 92/104  soft-tissue]
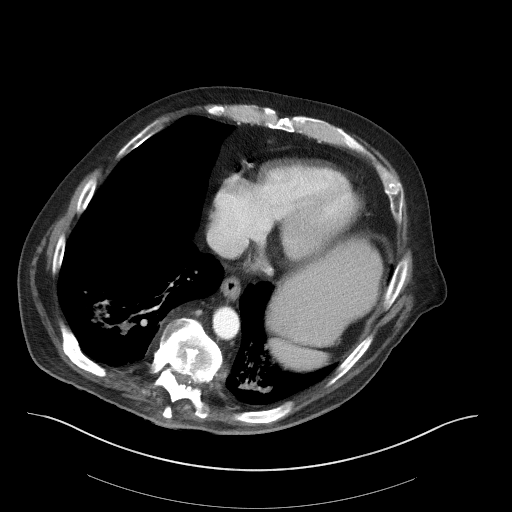
[im 98/104  soft-tissue]
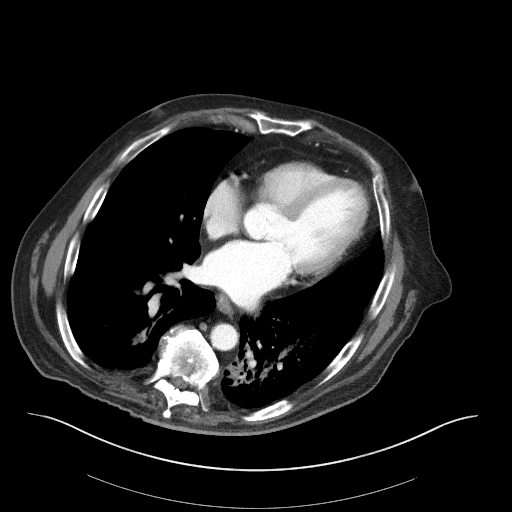

[Series 6: coronal st · coronal · 0.91mm/px · 3 of 105 slices shown]
[im 35/105  soft-tissue]
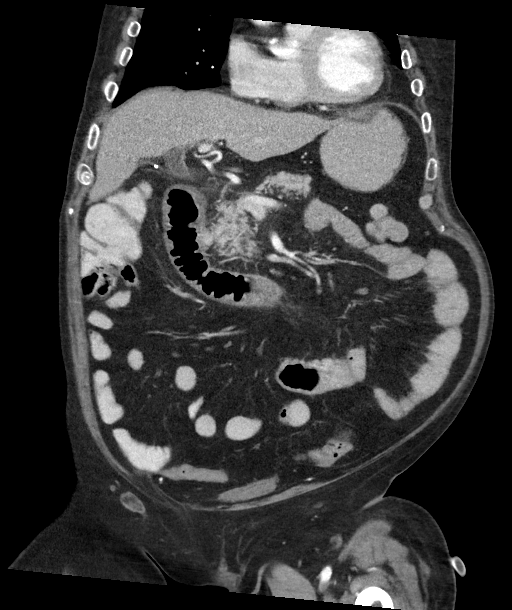
[im 47/105  soft-tissue]
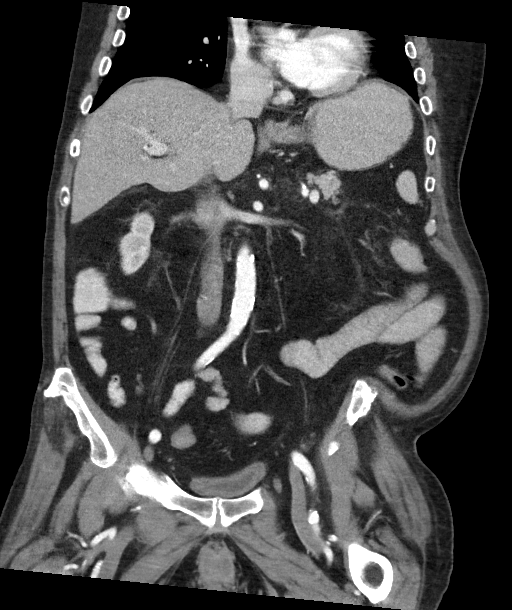
[im 58/105  soft-tissue]
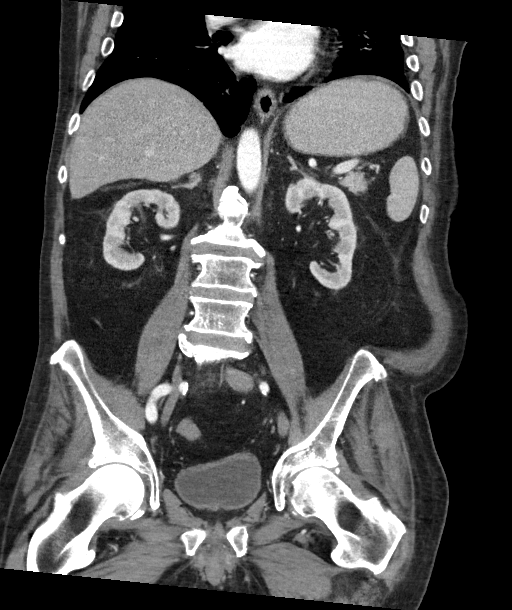

[16 of 46 positions shown; findings below may reference images not displayed]

FINDINGS: Lower chest: Heart is enlarged. Patchy airspace disease is
identified in both lower lungs, left greater than right suggesting
pneumonia.

Hepatobiliary: No suspicious focal abnormality within the liver
parenchyma. Gallbladder surgically absent. No intrahepatic or
extrahepatic biliary dilation.

Pancreas: No focal mass lesion. No dilatation of the main duct. No
intraparenchymal cyst. No peripancreatic edema.

Spleen: No splenomegaly. No focal mass lesion.

Adrenals/Urinary Tract: No adrenal nodule or mass. Kidneys
unremarkable. No evidence for hydroureter. Anterior bladder wall
appears tethered to the anterior peritoneum of the lower anterior
abdominal wall but is otherwise unremarkable.

Stomach/Bowel: Stomach is distended with contrast material. Duodenum
is normally positioned as is the ligament of Treitz. Mild
circumferential duodenal wall thickening evident (39/2) with subtle
periduodenal edema/inflammation. No small bowel wall thickening. No
small bowel dilatation. The terminal ileum is normal. The appendix
is normal. No gross colonic mass. No colonic wall thickening.

Vascular/Lymphatic: There is abdominal aortic atherosclerosis
without aneurysm. There is no gastrohepatic or hepatoduodenal
ligament lymphadenopathy. No intraperitoneal or retroperitoneal
lymphadenopathy. No pelvic sidewall lymphadenopathy.

Reproductive: The prostate gland and seminal vesicles are
unremarkable.

Other: No intraperitoneal free fluid.

Musculoskeletal: No worrisome lytic or sclerotic osseous
abnormality. Thoracic spinal stimulator device evident. T10 inferior
endplate compression fracture new since 03/14/2018.
IMPRESSION: 1. Mild circumferential duodenal wall thickening with subtle
periduodenal edema/inflammation. Imaging features suggest
duodenitis.
2. Patchy airspace disease in both lower lungs, left greater than
right, compatible with pneumonia.
3. T10 inferior endplate compression fracture new since 03/14/2018.
[DATE].  Aortic Atherosclerois ([id number]-170.0)

## 2020-08-03 ENCOUNTER — Telehealth: Payer: Self-pay

## 2020-08-03 ENCOUNTER — Other Ambulatory Visit: Payer: Self-pay | Admitting: Neurology

## 2020-08-03 DIAGNOSIS — G959 Disease of spinal cord, unspecified: Secondary | ICD-10-CM

## 2020-08-03 NOTE — Telephone Encounter (Signed)
Phone call to patient to verify medication list and allergies for myelogram procedure. Pt instructed to hold Cymbalta for 48hrs prior to myelogram appointment time and 24 hours after appointment. Pt reports he has a Spinal cord stimulator and pt was instructed to bring his remote for his Stimulator the day of the procedure.  Pt also instructed to have a driver the day of the procedure, the procedure would take around 2 hours, and discharge instructions discussed. Pt verbalized understanding.

## 2020-08-11 ENCOUNTER — Other Ambulatory Visit: Payer: Self-pay

## 2020-08-11 ENCOUNTER — Ambulatory Visit
Admission: RE | Admit: 2020-08-11 | Discharge: 2020-08-11 | Disposition: A | Discharge status: Home / Self Care | Payer: Medicare Other | Source: Ambulatory Visit | Attending: Neurology | Admitting: Neurology

## 2020-08-11 ENCOUNTER — Other Ambulatory Visit: Payer: Medicare Other

## 2020-08-11 DIAGNOSIS — G959 Disease of spinal cord, unspecified: Secondary | ICD-10-CM

## 2020-08-11 MED ORDER — IOPAMIDOL (ISOVUE-M 300) INJECTION 61%
10.0000 mL | Freq: Once | INTRAMUSCULAR | Status: AC
  Administered 2020-08-11: 10 mL via INTRATHECAL

## 2020-08-11 MED ORDER — DIAZEPAM 5 MG PO TABS
5.0000 mg | ORAL_TABLET | Freq: Once | ORAL | Status: AC
  Administered 2020-08-11: 5 mg via ORAL

## 2020-08-11 NOTE — Progress Notes (Signed)
Pt reports he has been off of his cymbalta for at least 48 hours and he also reports he has turned off his spinal cord stimulator for his myelogram procedure.

## 2020-08-11 NOTE — Discharge Instructions (Signed)
Myelogram Discharge Instructions  1. Go home and rest quietly for the next 24 hours.  It is important to lie flat for the next 24 hours.  Get up only to go to the restroom.  You may lie in the bed or on a couch on your back, your stomach, your left side or your right side.  You may have one pillow under your head.  You may have pillows between your knees while you are on your side or under your knees while you are on your back.  2. DO NOT drive today.  Recline the seat as far back as it will go, while still wearing your seat belt, on the way home.  3. You may get up to go to the bathroom as needed.  You may sit up for 10 minutes to eat.  You may resume your normal diet and medications unless otherwise indicated.  Drink lots of extra fluids today and tomorrow.  4. The incidence of headache, nausea, or vomiting is about 5% (one in 20 patients).  If you develop a headache, lie flat and drink plenty of fluids until the headache goes away.  Caffeinated beverages may be helpful.  If you develop severe nausea and vomiting or a headache that does not go away with flat bed rest, call [phone].  5. You may resume normal activities after your 24 hours of bed rest is over; however, do not exert yourself strongly or do any heavy lifting tomorrow. If when you get up you have a headache when standing, go back to bed and force fluids for another 24 hours.  6. Call your physician for a follow-up appointment.  The results of your myelogram will be sent directly to your physician by the following day.  7. If you have any questions or if complications develop after you arrive home, please call [phone].  Discharge instructions have been explained to the patient.  The patient, or the person responsible for the patient, fully understands these instructions  YOU MAY RESUME YOUR CYMBALTA TOMORROW 08/12/20 AT 1030 AM

## 2021-06-03 ENCOUNTER — Ambulatory Visit (INDEPENDENT_AMBULATORY_CARE_PROVIDER_SITE_OTHER): Payer: Medicare Other | Admitting: Clinical

## 2021-06-03 ENCOUNTER — Other Ambulatory Visit: Payer: Self-pay

## 2021-06-03 DIAGNOSIS — F321 Major depressive disorder, single episode, moderate: Secondary | ICD-10-CM

## 2021-06-03 NOTE — Progress Notes (Signed)
IN PERSON  I connected with [name] on 06/03/21 at 10:00 AM EST in person and verified that I am speaking with the correct person using two identifiers.  Location: Patient: Office Provider: Office    Comprehensive Clinical Assessment (CCA) Note  06/03/2021 [name] [id number]  Chief Complaint: Prior history of Depression  Visit Diagnosis: Moderate Depression   CCA Screening, Triage and Referral (STR)  Patient Reported Information How did you hear about [place]? No data recorded Referral name: No data recorded Referral phone number: No data recorded  Whom do you see for routine medical problems? No data recorded Practice/Facility Name: No data recorded Practice/Facility Phone Number: No data recorded Name of Contact: No data recorded Contact Number: No data recorded Contact Fax Number: No data recorded Prescriber Name: No data recorded Prescriber Address (if known): No data recorded  What Is the Reason for Your Visit/Call Today? No data recorded How Long Has This Been Causing You Problems? No data recorded What Do You Feel Would Help You the Most Today? No data recorded  Have You Recently Been in Any Inpatient Treatment (Hospital/Detox/Crisis Center/28-Day Program)? No data recorded Name/Location of Program/Hospital:No data recorded How Long Were You There? No data recorded When Were You Discharged? No data recorded  Have You Ever Received Services From [facility] Before? No data recorded Who Do You See at [facility]? No data recorded  Have You Recently Had Any Thoughts About Hurting Yourself? No data recorded Are You Planning to Commit Suicide/Harm Yourself At This time? No data recorded  Have you Recently Had Thoughts About [place]? No data recorded Explanation: No data recorded  Have You Used Any Alcohol or Drugs in the Past 24 Hours? No data recorded How Long Ago Did You Use Drugs or Alcohol? No data recorded What Did You Use  and How Much? No data recorded  Do You Currently Have a Therapist/Psychiatrist? No data recorded Name of Therapist/Psychiatrist: No data recorded  Have You Been Recently Discharged From Any Office Practice or Programs? No data recorded Explanation of Discharge From Practice/Program: No data recorded    CCA Screening Triage Referral Assessment Type of Contact: No data recorded Is this Initial or Reassessment? No data recorded Date Telepsych consult ordered in [facility]:  No data recorded Time Telepsych consult ordered in [facility]:  No data recorded  Patient Reported Information Reviewed? No data recorded Patient Left Without Being Seen? No data recorded Reason for Not Completing Assessment: No data recorded  Collateral Involvement: No data recorded  Does Patient Have a [place]? No data recorded Name and Contact of Legal Guardian: No data recorded If Minor and Not Living with Parent(s), Who has Custody? No data recorded Is CPS involved or ever been involved? No data recorded Is APS involved or ever been involved? No data recorded  Patient Determined To Be At Risk for Harm To Self or Others Based on Review of Patient Reported Information or Presenting Complaint? No data recorded Method: No data recorded Availability of Means: No data recorded Intent: No data recorded Notification Required: No data recorded Additional Information for Danger to Others Potential: No data recorded Additional Comments for Danger to Others Potential: No data recorded Are There Guns or Other Weapons in Your Home? No data recorded Types of Guns/Weapons: No data recorded Are These Weapons Safely Secured?  No data recorded Who Could Verify You Are Able To Have These Secured: No data recorded Do You Have any Outstanding Charges, Pending Court Dates, Parole/Probation? No data recorded Contacted To Inform of Risk of Harm To Self or Others: No data recorded  Location of  Assessment: No data recorded  Does Patient Present under Involuntary Commitment? No data recorded IVC Papers Initial File Date: No data recorded  [place] of Residence: No data recorded  Patient Currently Receiving the Following Services: No data recorded  Determination of Need: No data recorded  Options For Referral: No data recorded    CCA Biopsychosocial Intake/Chief Complaint:  The patient was reffered by PCP through the [place]  Current Symptoms/Problems: The patient notes prior history of counseling   Patient Reported Schizophrenia/Schizoaffective Diagnosis in Past: No   Strengths: Taking care of pets, cleaning home, self care  Preferences: Playing with the dogs (2 labs), Watching TV Westerns (Gunsmoke / Rifleman)  Abilities: No current hobbies identified   Type of Services Patient Feels are Needed: Individual Therapy   Initial Clinical Notes/Concerns: No recent [place] treatment. No current S/I or H/I   Mental Health Symptoms Depression:   Fatigue; Sleep (too much or little); Irritability; Difficulty Concentrating; Weight gain/loss (Ongoing difficulty adjusting to limitations with mobility and wheelchair)   Duration of Depressive symptoms:  Greater than two weeks   Mania:   None   Anxiety:    None   Psychosis:   None   Duration of Psychotic symptoms: No data recorded  Trauma:   None   Obsessions:   None   Compulsions:   None   Inattention:   None   Hyperactivity/Impulsivity:   None   Oppositional/Defiant Behaviors:   None   Emotional Irregularity:   None   Other Mood/Personality Symptoms:   No Additional    Mental Status Exam Appearance and self-care  Stature:   Average   Weight:   Overweight   Clothing:   Casual   Grooming:   Normal   Cosmetic use:   None   Posture/gait:   Normal   Motor activity:   Not Remarkable   Sensorium  Attention:   Normal   Concentration:   Scattered   Orientation:   X5    Recall/memory:   Defective in Short-term   Affect and Mood  Affect:   Appropriate   Mood:   Depressed   Relating  Eye contact:   Normal   Facial expression:   Responsive   Attitude toward examiner:   Cooperative   Thought and Language  Speech flow:  Normal   Thought content:   Appropriate to Mood and Circumstances   Preoccupation:   None   Hallucinations:   None   Organization:  Logical  Transport planner of Knowledge:   Good   Intelligence:   Average   Abstraction:   Normal   Judgement:   Good   Reality Testing:   Realistic   Insight:   Good   Decision Making:   Normal   Social Functioning  Social Maturity:   Responsible   Social Judgement:   Normal   Stress  Stressors:   Illness; Transitions (The patient notes back pain and medication for pain. Patient is wheelchair bound age 75)   Coping Ability:   Normal   Skill Deficits:   None   Supports:   Family (Wife and children , Family, Churchfamily)     Religion: Religion/Spirituality Are You A Religious Person?: Yes What is  Your Religious Affiliation?: Methodist How Might This Affect Treatment?: Protective Factor  Leisure/Recreation: Leisure / Recreation Do You Have Hobbies?: No  Exercise/Diet: Exercise/Diet Do You Exercise?: Yes (Current involved 2x per week in PT) What Type of Exercise Do You Do?: Other (Comment) (PT) How Many Times a Week Do You Exercise?: 1-3 times a week Have You Gained or Lost A Significant Amount of Weight in the Past Six Months?: No Do You Follow a Special Diet?: No Do You Have Any Trouble Sleeping?: Yes Explanation of Sleeping Difficulties: Difficulty with staying asleep   CCA Employment/Education Employment/Work Situation: Employment / Work Situation Employment Situation: Retired Why is Patient on Disability: SSI Patient's Job has Been Impacted by Current Illness: No What is the Longest Time Patient has Held a Job?: All his  life 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 Has Patient ever Been in the [employer]?: Yes (Describe in comment) Education officer, 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 [place])) Did You Receive Any Psychiatric Treatment/Services While in the [employer]?: No  Education: Education Last Grade Completed: 12 Name of High School: [employer] Did Teacher, adult education From [employer]?: No Did [place]?: No Did Heritage manager?: No Did You Have Any Special Interests In School?: NA Did You Have An Individualized Education Program (IIEP): No Did You Have Any Difficulty At School?: No Patient's Education Has Been Impacted by Current Illness: No   CCA Family/Childhood History Family and Relationship History: Family history Marital status: Married Number of Years Married: 96 What types of issues is patient dealing with in the relationship?: No current problems with wife Additional relationship information: No Additional Are you sexually active?: No What is your sexual orientation?: Heterosexual Has your sexual activity been affected by drugs, alcohol, medication, or emotional stress?: NA Does patient have children?: Yes How many children?: 3 How is patient's relationship with their children?: The patient notes having 2 boys and a girl . The patient notes having a good realtionship with his children  Childhood History:  Childhood History By whom was/is the patient raised?: Both parents Additional childhood history information: No Additional Information Description of patient's relationship with caregiver when they were a child: Good Patient's description of current relationship with people who raised him/her: Both parents Deceased How were you disciplined when you got in trouble as a child/adolescent?: Spankings /Grounding Does patient have siblings?: Yes Number of Siblings: 3 Description of patient's current relationship with  siblings: The patient has 1 brother and 2 sister. The patient notes, " I have a good relationship with my brothers and sisters". Did patient suffer any verbal/emotional/physical/sexual abuse as a child?: No Did patient suffer from severe childhood neglect?: No Has patient ever been sexually abused/assaulted/raped as an adolescent or adult?: No Was the patient ever a victim of a crime or a disaster?: No Witnessed domestic violence?: No Has patient been affected by domestic violence as an adult?: No  Child/Adolescent Assessment:     CCA Substance Use Alcohol/Drug Use: Alcohol / Drug Use Pain Medications: See MAR Prescriptions: See MAR Over the Counter: None History of alcohol / drug use?: No history of alcohol / drug abuse Longest period of sobriety (when/how long): NA                         ASAM's:  Six Dimensions of Multidimensional Assessment  Dimension 1:  Acute Intoxication and/or Withdrawal Potential:  Dimension 2:  Biomedical Conditions and Complications:      Dimension 3:  Emotional, Behavioral, or Cognitive Conditions and Complications:     Dimension 4:  Readiness to Change:     Dimension 5:  Relapse, Continued use, or Continued Problem Potential:     Dimension 6:  Recovery/Living Environment:     ASAM Severity Score:    ASAM Recommended Level of Treatment:     Substance use Disorder (SUD)    Recommendations for Services/Supports/Treatments: Recommendations for Services/Supports/Treatments Recommendations For Services/Supports/Treatments: Individual Therapy  DSM5 Diagnoses: Patient Active Problem List   Diagnosis Date Noted   Weakness 09/27/2019   Abnormal CT scan    Nausea and vomiting    Opiate use    Diarrhea    Compression fracture of T10 vertebra (HCC) 07/25/2018   Duodenitis 07/25/2018   Aspiration pneumonia of both lower lobes due to gastric secretions (HCC) 07/21/2018   Acute respiratory failure with hypoxia (HCC) 07/21/2018   Acute  kidney injury (HCC) 07/20/2018   Dehydration 07/20/2018   Opiate overdose (HCC) 07/20/2018   Peripheral neuropathy 06/06/2018   Acute encephalopathy 05/09/2018   Multiple falls 05/08/2018   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   OBSTRUCTIVE SLEEP APNEA 08/20/2007   Chronic pain syndrome 07/30/2007   Essential hypertension 07/30/2007   Ankylosing spondylitis (HCC) 07/30/2007    Patient Centered Plan: Patient is on the following Treatment Plan(s):  Depression, Moderate Single Episode   Referrals to Alternative Service(s): Referred to Alternative Service(s):   Place:   Date:   Time:    Referred to Alternative Service(s):   Place:   Date:   Time:    Referred to Alternative Service(s):   Place:   Date:   Time:    Referred to Alternative Service(s):   Place:   Date:   Time:     The patient was advised to call back or seek an in-person evaluation if the symptoms worsen or if the condition fails to improve as anticipated.  I provided 60 minutes of non-face-to-face time during this encounter.  Winfred Burn, LCSW  06/03/2021

## 2021-09-24 ENCOUNTER — Emergency Department (HOSPITAL_COMMUNITY)
Admission: EM | Admit: 2021-09-24 | Discharge: 2021-09-24 | Disposition: A | Discharge status: Home / Self Care | Payer: Medicare Other | Attending: Emergency Medicine | Admitting: Emergency Medicine

## 2021-09-24 ENCOUNTER — Emergency Department (HOSPITAL_COMMUNITY): Payer: Medicare Other

## 2021-09-24 ENCOUNTER — Other Ambulatory Visit: Payer: Self-pay

## 2021-09-24 ENCOUNTER — Encounter (HOSPITAL_COMMUNITY): Payer: Self-pay

## 2021-09-24 DIAGNOSIS — Z79899 Other long term (current) drug therapy: Secondary | ICD-10-CM | POA: Diagnosis not present

## 2021-09-24 DIAGNOSIS — R7989 Other specified abnormal findings of blood chemistry: Secondary | ICD-10-CM | POA: Diagnosis not present

## 2021-09-24 DIAGNOSIS — U071 COVID-19: Secondary | ICD-10-CM | POA: Insufficient documentation

## 2021-09-24 DIAGNOSIS — R531 Weakness: Secondary | ICD-10-CM | POA: Diagnosis present

## 2021-09-24 DIAGNOSIS — I10 Essential (primary) hypertension: Secondary | ICD-10-CM | POA: Diagnosis not present

## 2021-09-24 LAB — CBC WITH DIFFERENTIAL/PLATELET
Abs Immature Granulocytes: 0.04 10*3/uL (ref 0.00–0.07)
Basophils Absolute: 0 10*3/uL (ref 0.0–0.1)
Basophils Relative: 1 %
Eosinophils Absolute: 0 10*3/uL (ref 0.0–0.5)
Eosinophils Relative: 0 %
HCT: 45.8 % (ref 39.0–52.0)
Hemoglobin: 15 g/dL (ref 13.0–17.0)
Immature Granulocytes: 1 %
Lymphocytes Relative: 24 %
Lymphs Abs: 1 10*3/uL (ref 0.7–4.0)
MCH: 31.4 pg (ref 26.0–34.0)
MCHC: 32.8 g/dL (ref 30.0–36.0)
MCV: 96 fL (ref 80.0–100.0)
Monocytes Absolute: 0.7 10*3/uL (ref 0.1–1.0)
Monocytes Relative: 16 %
Neutro Abs: 2.4 10*3/uL (ref 1.7–7.7)
Neutrophils Relative %: 58 %
Platelets: 189 10*3/uL (ref 150–400)
RBC: 4.77 MIL/uL (ref 4.22–5.81)
RDW: 13.2 % (ref 11.5–15.5)
WBC: 4.1 10*3/uL (ref 4.0–10.5)
nRBC: 0 % (ref 0.0–0.2)

## 2021-09-24 LAB — BASIC METABOLIC PANEL
Anion gap: 9 (ref 5–15)
BUN: 29 mg/dL — ABNORMAL HIGH (ref 8–23)
CO2: 26 mmol/L (ref 22–32)
Calcium: 8.9 mg/dL (ref 8.9–10.3)
Chloride: 101 mmol/L (ref 98–111)
Creatinine, Ser: 1.75 mg/dL — ABNORMAL HIGH (ref 0.61–1.24)
GFR, Estimated: 40 mL/min — ABNORMAL LOW (ref >60)
Glucose, Bld: 110 mg/dL — ABNORMAL HIGH (ref 70–99)
Potassium: 4.7 mmol/L (ref 3.5–5.1)
Sodium: 136 mmol/L (ref 135–145)

## 2021-09-24 MED ORDER — HYDROCODONE-ACETAMINOPHEN 5-325 MG PO TABS
2.0000 | ORAL_TABLET | Freq: Once | ORAL | Status: AC
  Administered 2021-09-24: 2 via ORAL
  Filled 2021-09-24: qty 2

## 2021-09-24 MED ORDER — SODIUM CHLORIDE 0.9 % IV BOLUS
1000.0000 mL | Freq: Once | INTRAVENOUS | Status: AC
  Administered 2021-09-24: 1000 mL via INTRAVENOUS

## 2021-09-24 MED ORDER — ALBUTEROL SULFATE HFA 108 (90 BASE) MCG/ACT IN AERS
2.0000 | INHALATION_SPRAY | Freq: Once | RESPIRATORY_TRACT | Status: AC
Start: 2021-09-24 — End: 2021-09-24
  Administered 2021-09-24: 2 via RESPIRATORY_TRACT
  Filled 2021-09-24: qty 6.7

## 2021-09-24 MED ORDER — NIRMATRELVIR/RITONAVIR (PAXLOVID) TABLET (RENAL DOSING)
2.0000 | ORAL_TABLET | Freq: Two times a day (BID) | ORAL | Status: AC
  Administered 2021-09-24: 2 via ORAL
  Filled 2021-09-24: qty 20

## 2021-09-24 NOTE — ED Provider Triage Note (Signed)
Emergency Medicine Provider Triage Evaluation Note ? ?Elenore Paddy , a 76 y.o. male  was evaluated in triage.  Pt complains of productive cough with back pain that has been ongoing since he was diagnosed COVID-positive 5 days ago with the [place].  Patient also endorses nausea, mild abdominal pain and congestion.  He has been taking Mucinex at home without any relief.  [facility] for evaluation. ? ?Review of Systems  ?Positive: Cough, congestion, nausea and abdominal pain ?Negative: Fevers, chills ? ?Physical Exam  ?BP 124/72 (BP Location: Left Arm)   Pulse 74   Temp 98.1 ?F (36.7 ?C) (Oral)   Resp 20   Ht 6' (1.829 m)   Wt 111.1 kg   SpO2 98%   BMI 33.23 kg/m?  ?Gen:   Awake, no distress   ?Resp:  Normal effort, right-sided Rales ?MSK:   Moves extremities without difficulty  ?Other:   ? ?Medical Decision Making  ?Medically screening exam initiated at 3:28 PM.  Appropriate orders placed.  Elenore Paddy was informed that the remainder of the evaluation will be completed by another provider, this initial triage assessment does not replace that evaluation, and the importance of remaining in the ED until their evaluation is complete. ? ? ?  ?Janell Quiet, [place] ?09/24/21 1531 ? ?

## 2021-09-24 NOTE — Discharge Instructions (Signed)
Please take Paxlovid as prescribed. ? ?Please hold your blood pressure medicines when you are taking the Paxlovid ? ?Stay hydrated. ? ?Use albuterol every 6 hours as needed for cough or congestion ? ?See your doctor for follow-up ? ?You need to stay home per guidelines ? ?Return to ER if you have worse shortness of breath, cough, fever, trouble breathing ? ? ? ? ?Person Under Monitoring Name: [name] ? ?Location: [address] ?[place] [address] ? ? ?Infection Prevention Recommendations for Individuals Confirmed to have, ?or Being Evaluated for, 2019 Novel Coronavirus (COVID-19) Infection Who ?Receive Care at Home ? ?Individuals who are confirmed to have, or are being evaluated for, COVID-19 should follow the prevention steps below ?until a healthcare provider or local or state health department says they can return to normal activities. ? ?Stay home except to get medical care ?You should restrict activities outside your home, except for getting medical care. Do not go to work, school, or public ?areas, and do not use public transportation or taxis. ? ?Call ahead before visiting your doctor ?Before your medical appointment, call the healthcare provider and tell them that you have, or are being evaluated for, ?COVID-19 infection. This will help the healthcare provider?s office take steps to keep other people from getting infected. ?Ask your healthcare provider to call the local or state health department. ? ?Monitor your symptoms ?Seek prompt medical attention if your illness is worsening (e.g., difficulty breathing). Before going to your medical ?appointment, call the healthcare provider and tell them that you have, or are being evaluated for, COVID-19 infection. Ask ?your healthcare provider to call the local or state health department. ? ?Wear a facemask ?You should wear a facemask that covers your nose and mouth when you are in the same room with other people and ?when you visit a  healthcare provider. People who live with or visit you should also wear a facemask while they are in the ?same room with you. ? ?Separate yourself from other people in your home ?As much as possible, you should stay in a different room from other people in your home. Also, you should use a separate ?bathroom, if available. ? ?Avoid sharing household items ?You should not share dishes, drinking glasses, cups, eating utensils, towels, bedding, or other items with other people in ?your home. After using these items, you should wash them thoroughly with soap and water. ? ?Cover your coughs and sneezes ?Cover your mouth and nose with a tissue when you cough or sneeze, or you can cough or sneeze into your sleeve. Throw ?used tissues in a lined trash can, and immediately wash your hands with soap and water for at least 20 seconds or use an ?alcohol-based hand rub. ? ?Wash your hands ?Wash your hands often and thoroughly with soap and water for at least 20 seconds. You can use an alcohol-based hand ?sanitizer if soap and water are not available and if your hands are not visibly dirty. Avoid touching your eyes, nose, and ?mouth with unwashed hands. ? ? ?Prevention Steps for Caregivers and Household Members of ?Individuals Confirmed to have, or Being Evaluated for, COVID-19 Infection Being Cared for in the Home ? ?If you live with, or provide care at home for, a person confirmed to have, or being evaluated for, COVID-19 infection ?please follow these guidelines to prevent infection: ? ?Follow healthcare provider?s instructions ?Make sure that you understand and can help the patient follow any healthcare provider instructions for all care. ? ?  Provide for the patient?s basic needs ?You should help the patient with basic needs in the home and provide support for getting groceries, prescriptions, and ?other personal needs. ? ?Monitor the patient?s symptoms ?If they are getting sicker, call his or her medical provider and tell  them that the patient has, or is being evaluated for, ?COVID-19 infection. This will help the healthcare provider?s office take steps to keep other people from getting infected. ?Ask the healthcare provider to call the local or state health department. ? ?Limit the number of people who have contact with the patient ?If possible, have only one caregiver for the patient. ?Other household members should stay in another home or place of residence. If this is not possible, they should stay ?in another room, or be separated from the patient as much as possible. Use a separate bathroom, if available. ?Restrict visitors who do not have an essential need to be in the home. ? ?Keep older adults, very young children, and other sick people away from the patient ?Keep older adults, very young children, and those who have compromised immune systems or chronic health conditions away from the patient. This includes people with chronic heart, lung, or kidney conditions, diabetes, and cancer. ? ?Ensure good ventilation ?Make sure that shared spaces in the home have good air flow, such as from an air conditioner or an opened window, ?weather permitting. ? ?Wash your hands often ?Wash your hands often and thoroughly with soap and water for at least 20 seconds. You can use an alcohol based hand sanitizer if soap and water are not available and if your hands are not visibly dirty. ?Avoid touching your eyes, nose, and mouth with unwashed hands. ?Use disposable paper towels to dry your hands. If not available, use dedicated cloth towels and replace them when they become wet. ? ?Wear a facemask and gloves ?Wear a disposable facemask at all times in the room and gloves when you touch or have contact with the patient?s blood, body fluids, and/or secretions or excretions, such as sweat, saliva, sputum, nasal mucus, vomit, urine, or feces.  Ensure the mask fits over your nose and mouth tightly, and do not touch it during use. ?Throw out  disposable facemasks and gloves after using them. Do not reuse. ?Wash your hands immediately after removing your facemask and gloves. ?If your personal clothing becomes contaminated, carefully remove clothing and launder. Wash your hands after handling contaminated clothing. ?Place all used disposable facemasks, gloves, and other waste in a lined container before disposing them with other household waste. ?Remove gloves and wash your hands immediately after handling these items. ? ?Do not share dishes, glasses, or other household items with the patient ?Avoid sharing household items. You should not share dishes, drinking glasses, cups, eating utensils, towels, bedding, or other items with a patient who is confirmed to have, or being evaluated for, COVID-19 infection. ?After the person uses these items, you should wash them thoroughly with soap and water. ? ?[employer] thoroughly ?Immediately remove and wash clothes or bedding that have blood, body fluids, and/or secretions or excretions, such as sweat, saliva, sputum, nasal mucus, vomit, urine, or feces, on them. ?Wear gloves when handling laundry from the patient. ?Read and follow directions on labels of laundry or clothing items and detergent. In general, wash and dry with the warmest temperatures recommended on the label. ? ?Clean all areas the individual has used often ?Clean all touchable surfaces, such as counters, tabletops, doorknobs, bathroom fixtures, toilets, phones, keyboards, tablets,  and bedside tables, every day. Also, clean any surfaces that may have blood, body fluids, and/or secretions or excretions on them. ?Wear gloves when cleaning surfaces the patient has come in contact with. ?Use a diluted bleach solution (e.g., dilute bleach with 1 part bleach and 10 parts water) or a household disinfectant with a label that says EPA-registered for coronaviruses. To make a bleach solution at home, add 1 tablespoon of bleach to 1 quart (4 cups) of water.  For a larger supply, add ? cup of bleach to 1 gallon (16 cups) of water. ?Read labels of cleaning products and follow recommendations provided on product labels. Labels contain instructions for safe and effective use o

## 2021-09-24 NOTE — ED Provider Notes (Signed)
?[facility]-EMERGENCY DEPT ?Provider Note ? ? ?CSN: [id number] ?Arrival date & time: 09/24/21  1443 ? ?  ? ?History ? ?Chief Complaint  ?Patient presents with  ? Covid Positive  ? ? ?KWANE ROHL is a 76 y.o. male history of hypertension here presenting with weakness.  Patient was tested positive for COVID 5 days ago.  Patient's wife is also positive.  Patient did not receive any meds.  He has a very fatigued and tired.  Patient has minimal shortness of breath.  Patient chronic pain meds at baseline. ? ?The history is provided by the patient.  ? ?  ? ?Home Medications ?Prior to Admission medications   ?Medication Sig Start Date End Date Taking? Authorizing Provider  ?acetaminophen (TYLENOL) 325 MG tablet Take 2 tablets (650 mg total) by mouth every 4 (four) hours as needed for mild pain (or temp > 37.5 C (99.5 F)). 09/29/19   Emokpae, Courage, MD  ?amLODipine (NORVASC) 10 MG tablet Take 10 mg by mouth daily.    [provider]  ?baclofen (LIORESAL) 10 MG tablet Take 10 mg by mouth 3 (three) times daily.    [provider]  ?cyclobenzaprine (FLEXERIL) 5 MG tablet Take 5 mg by mouth 3 (three) times daily as needed. 09/12/19   [provider]  ?Docusate Sodium (DSS) 100 MG CAPS Take 100 mg by mouth.    [provider]  ?DULoxetine (CYMBALTA) 30 MG capsule Take 60 mg by mouth daily.    [provider]  ?hydrochlorothiazide (HYDRODIURIL) 25 MG tablet 25 mg. 03/12/20   [provider]  ?losartan (COZAAR) 25 MG tablet Take 1 tablet (25 mg total) by mouth daily. 09/29/19   Shon Hale, MD  ?meloxicam (MOBIC) 7.5 MG tablet Take 1 tablet (7.5 mg total) by mouth daily. 09/29/19   Shon Hale, MD  ?Naldemedine Tosylate (SYMPROIC) 0.2 MG TABS Take 0.2 mg by mouth daily.    [provider]  ?naloxegol oxalate (MOVANTIK) 25 MG TABS tablet Take 25 mg by mouth daily.    [provider]  ?oxyCODONE-acetaminophen (PERCOCET) 10-325 MG  tablet Take 1 tablet by mouth every 6 (six) hours as needed for pain. 09/09/19   [provider]  ?pantoprazole (PROTONIX) 40 MG tablet 1 po 30 mins prior to your first meal 11/05/18   Fields, Darleene Cleaver, MD  ?pregabalin (LYRICA) 150 MG capsule Take 150 mg by mouth 3 (three) times daily.    [provider]  ?tamsulosin (FLOMAX) 0.4 MG CAPS capsule Take 1 capsule (0.4 mg total) by mouth at bedtime. 09/29/19   Shon Hale, MD  ?   ? ?Allergies    ?Codeine   ? ?Review of Systems   ?Review of Systems  ?Neurological:  Positive for weakness.  ?All other systems reviewed and are negative. ? ?Physical Exam ?Updated Vital Signs ?BP (!) 141/74   Pulse (!) 57   Temp 98.1 ?F (36.7 ?C) (Oral)   Resp 16   Ht 6' (1.829 m)   Wt 111.1 kg   SpO2 97%   BMI 33.23 kg/m?  ?Physical Exam ?Vitals and nursing note reviewed.  ?Constitutional:   ?   Comments: Chronically ill, slightly dehydrated  ?HENT:  ?   Head: Normocephalic.  ?   Nose: Nose normal.  ?   Mouth/Throat:  ?   Mouth: Mucous membranes are dry.  ?Eyes:  ?   Extraocular Movements: Extraocular movements intact.  ?   Pupils: Pupils are equal, round, and reactive  to light.  ?Cardiovascular:  ?   Rate and Rhythm: Normal rate and regular rhythm.  ?   Pulses: Normal pulses.  ?   Heart sounds: Normal heart sounds.  ?Pulmonary:  ?   Effort: Pulmonary effort is normal.  ?   Breath sounds: Normal breath sounds.  ?Abdominal:  ?   General: Abdomen is flat.  ?   Palpations: Abdomen is soft.  ?Musculoskeletal:     ?   General: Normal range of motion.  ?   Cervical back: Normal range of motion.  ?Skin: ?   General: Skin is warm.  ?   Capillary Refill: Capillary refill takes less than 2 seconds.  ?Neurological:  ?   General: No focal deficit present.  ?   Mental Status: He is oriented to person, place, and time.  ?Psychiatric:     ?   Mood and Affect: Mood normal.     ?   Behavior: Behavior normal.  ? ? ?ED Results / Procedures / Treatments   ?Labs ?(all labs ordered are  listed, but only abnormal results are displayed) ?Labs Reviewed  ?BASIC METABOLIC PANEL - Abnormal; Notable for the following components:  ?    Result Value  ? Glucose, Bld 110 (*)   ? BUN 29 (*)   ? Creatinine, Ser 1.75 (*)   ? GFR, Estimated 40 (*)   ? All other components within normal limits  ?CBC WITH DIFFERENTIAL/PLATELET  ? ? ?EKG ?None ? ?Radiology ?DG Chest Port 1 View ? ?Result Date: 09/24/2021 ?CLINICAL DATA:  Shortness of breath and cough. EXAM: PORTABLE CHEST 1 VIEW COMPARISON:  October 07, 2019 FINDINGS: Low lung volumes are noted with mild, stable bibasilar atelectasis and/or scarring. There is no evidence of a pleural effusion or pneumothorax. The heart size and mediastinal contours are within normal limits. Stable spinal stimulator wire positioning is seen. Postoperative changes are noted within the cervical spine. Multilevel vertebroplasty is seen within the mid and lower thoracic spine. IMPRESSION: Low lung volumes with mild, stable bibasilar atelectasis and/or scarring. Electronically Signed   By: Aram Candelahaddeus  Houston M.D.   On: 09/24/2021 17:04   ? ?Procedures ?Procedures  ? ? ?Medications Ordered in ED ?Medications  ?sodium chloride 0.9 % bolus 1,000 mL (0 mLs Intravenous Stopped 09/24/21 1754)  ?albuterol (VENTOLIN HFA) 108 (90 Base) MCG/ACT inhaler 2 puff (2 puffs Inhalation Given 09/24/21 1627)  ?HYDROcodone-acetaminophen (NORCO/VICODIN) 5-325 MG per tablet 2 tablet (2 tablets Oral Given 09/24/21 1626)  ?nirmatrelvir/ritonavir EUA (renal dosing) (PAXLOVID) 2 tablet (2 tablets Oral Given 09/24/21 1752)  ? ? ?ED Course/ Medical Decision Making/ A&P ?  ?                        ?Medical Decision Making ?Elenore PaddyWilliam M Tufano is a 76 y.o. male here with weakness.  Patient is day 5 of COVID.  Patient does not have any oxygen requirement.  Patient is chronically ill but does not appear acutely ill.  We will get CBC and CMP and chest x-ray.  Will hydrate and patient will qualify for Paxlovid ? ?6:05  PM ?Patient's creatinine is 1.7 and baseline is 1.6.  Patient received 1 L bolus.  Chest x-ray showed low lung volumes.  At this point, patient is stable to be discharged on low-dose Paxlovid.  I told him to hold his blood pressure medicines for now ? ? ?Problems Addressed: ?COVID-19: acute illness or injury ? ?Amount and/or Complexity of Data Reviewed ?Independent  Historian: spouse ?External Data Reviewed: notes. ?Labs: ordered. Decision-making details documented in ED Course. ?Radiology: ordered and independent interpretation performed. Decision-making details documented in ED Course. ? ?Risk ?Prescription drug management. ? ? ?Final Clinical Impression(s) / ED Diagnoses ?Final diagnoses:  ?None  ? ? ?Rx / DC Orders ?ED Discharge Orders   ? ? None  ? ?  ? ? ?  ?Charlynne Pander, MD ?09/24/21 1806 ? ?

## 2021-09-24 NOTE — ED Triage Notes (Signed)
Patient reports covid positive x 5 days. Patient has SOB, weakness, cough. ?

## 2022-07-19 ENCOUNTER — Ambulatory Visit (HOSPITAL_BASED_OUTPATIENT_CLINIC_OR_DEPARTMENT_OTHER): Payer: Medicare Other | Admitting: Physical Therapy

## 2022-08-24 NOTE — Therapy (Signed)
OUTPATIENT PHYSICAL THERAPY THORACOLUMBAR EVALUATION   Patient Name: [name] MRN: [id number] DOB:02-01-1946, 77 y.o., male Today's Date: 08/26/2022  END OF SESSION:  PT End of Session - 08/26/22 2002     Visit Number 1    Number of Visits 16    Date for PT Re-Evaluation 10/20/22    Authorization Type [employer] MCR    PT Start Time [id number]    PT Stop Time 1408    PT Time Calculation (min) 60 min    Activity Tolerance Patient limited by fatigue;Patient tolerated treatment well    Behavior During Therapy [facility] for tasks assessed/performed             Past Medical History:  Diagnosis Date   Alcohol use 05/2018   daily   Ankylosing 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 pt has note from [facility] indicating difficulty   First degree AV block    chronic   Gait instability    Hypertension    Multiple falls    OSA (obstructive sleep apnea)    Pain management    Peripheral axonal neuropathy    Peripheral neuropathy 06/06/2018   Tremor    Past Surgical History:  Procedure Laterality Date   ANKLE SURGERY Right    orif   BACK SURGERY     BIOPSY  11/05/2018   Procedure: BIOPSY;  Surgeon: Danie Binder, MD;  Location: [facility] ENDO SUITE;  Service: Endoscopy;;   CHOLECYSTECTOMY     ESOPHAGOGASTRODUODENOSCOPY (EGD) WITH PROPOFOL N/A 11/05/2018   Procedure: ESOPHAGOGASTRODUODENOSCOPY (EGD) WITH PROPOFOL;  Surgeon: Danie Binder, MD;  Location: [facility] ENDO SUITE;  Service: Endoscopy;  Laterality: N/A;  10:30am-rescheduled to 4/7 @ 7:30am per office   JOINT REPLACEMENT Bilateral    knees   NECK SURGERY     cervical fusion   SPINAL CORD STIMULATOR INSERTION  2017   TOTAL KNEE ARTHROPLASTY     Patient Active Problem List   Diagnosis Date Noted   Weakness 09/27/2019    Abnormal CT scan    Nausea and vomiting    Opiate use    Diarrhea    Compression fracture of T10 vertebra ([place]) 07/25/2018   Duodenitis 07/25/2018   Aspiration pneumonia of both lower lobes due to gastric secretions ([place]) 07/21/2018   Acute respiratory failure with hypoxia ([place]) 07/21/2018   Acute kidney injury ([place]) 07/20/2018   Dehydration 07/20/2018   Opiate overdose (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   OBSTRUCTIVE SLEEP APNEA 08/20/2007   Chronic pain syndrome 07/30/2007   Essential hypertension 07/30/2007   Ankylosing spondylitis ([place]) 07/30/2007     REFERRING PROVIDER: Levy Pupa, PA-C   REFERRING DIAG: M54.40 (ICD-10-CM) - Lumbago with sciatica, unspecified side   Rationale for Evaluation and Treatment: Rehabilitation  THERAPY DIAG:  Other low back pain  Muscle weakness (generalized)  Difficulty in walking, not elsewhere classified  Other lack of coordination  ONSET DATE: Chronic pain ; MD order 06/09/2022  SUBJECTIVE:  SUBJECTIVE STATEMENT: "Goes by [name]"  Pt reports having back pain since high school with no specific MOI.  Pt states he has balance issues and reports he has fallen several times over the years.  Pt had spinal cord stimulator implanted in 2017 and reports no relief.  He states he still has it, but does not use it.  He has received injections and RF.  Pt reports short term relief from RF.    C/C on MD notes indicated weakness and chronic back pain.  PT order indicated aquatic therapy.  hyperostosis (DISH), spinal and joint pain, general debility per MD notes.   Pt states he is unable to stand and states he just falls over.  He hasn't taken any steps for 2-3  years.  Pt has much difficulty with taking a step to get into shower.  Pt unable to stand without using UE's.  Pt is significantly limited with standing duration.  He was unable to stand at the [place] to get his weight.     Pt had prior PT for balance and reports short term relief.  Pt received aquatic therapy approx 2 years ago.  Pt states it didn't help his gait.  Pt reports he has cramps in his back in AM.  Pt states he has numbness in bilat feet.  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 Yes NPRS:  6/10 current, 8/10 worst, 4-5/10 best Location:  Central lumbar Pt has increased pain with rainy weather.  PRECAUTIONS: Fall and Other: chronic back pain, DISH, prior kyphoplasties, peripheral neuropathy, bilat TKA, cervical suion  WEIGHT BEARING RESTRICTIONS: No  FALLS:  Has patient fallen in last 6 months? No  LIVING ENVIRONMENT: Lives with: lives with their family Lives in: 2 story home Stairs: ramp to enter home.  Pt hasn't gone on the 2nd floor in years.   Has following equipment at home: Gilford Rile - 2 wheeled, Environmental consultant - 4 wheeled, Wheelchair (manual), and shower chair   PLOF: Pt has a hx of chronic lumbar pain and co-morbidities that have limited his ability to perform functional mobility and limited his tolerance to activity.   PATIENT GOALS: to be able stand for > 2-3 sec, to be able to ambulate with holding on to something    OBJECTIVE:   DIAGNOSTIC FINDINGS:  X rays in 2022: Moderate to advanced disc space height loss L5/S1.  Mod to  mod dis space height loss t/o the remainder of the lumbar spine.  Prominent anterior osteophytes greatest L3/4 and L1/2.  Mod to advanced multilevel face deg changes. SP deg changes.  IMPRESSION: Multilevel deg changes of the lumbar spine.   MRI in 2015: IMPRESSION: 1. Multilevel degenerative disc disease and facet arthropathy, as above. The L2-L3 disc protrusion may produce left L3 radicular symptoms. The L5-S1 disc protrusion may produce bilateral S1 radicular symptoms, left greater than right. There is severe impingement of the exiting left L5 nerve root. Clinical correlation recommended.   PATIENT SURVEYS:  FOTO 14 with a goal of 35 at visit #14   COGNITION: Overall cognitive status: Within functional limits for tasks assessed      OBSERVATION/POSTURE:  Seated in W/C with increased FHP and rounded shoulder.  He is in cervical flexion in sitting.  PT checked bilat feet.  Skin was intact and PT didn't notice any wounds or any scabbing.      LOWER EXTREMITY ROM:     Active  Right eval Left eval  Hip flexion    Hip extension    Hip abduction    Hip adduction    Hip internal rotation    Hip external rotation    Knee flexion    Knee extension    Ankle dorsiflexion [facility]  Ankle plantarflexion    Ankle inversion    Ankle eversion     (Blank rows = not tested)  LOWER EXTREMITY MMT:    MMT Right eval Left eval  Hip flexion 13.4 13.1  Hip extension    Hip abduction 15.3 15.0  Hip adduction    Hip internal rotation    Hip external rotation    Knee flexion    Knee extension Unable to perform full knee extension seated ; 14.8 Unable to perform full knee extension seated ; 20.4  Ankle dorsiflexion 5/5 5/5  Ankle plantarflexion    Ankle inversion    Ankle eversion     (Blank rows = not tested)    FUNCTIONAL TESTS:  Sit to stand transfer:  min assist.  Pt only able to stand for approx 5 seconds  GAIT: Comments: pt ambulated with FWW x 3 steps with min assist  with W/C behind him.  Pt's legs were shaky.  He has bilat knee flexion and has decreased step length bilat.     TODAY'S TREATMENT:                                                                                                                               Reviewed current home exercises.  Pt performs seated marching, heel raises, toe raises, and W/C push ups.    PATIENT EDUCATION:  Education details: current HEP, dx, objective findings, rationale of aquatic therapy, and aquatic therapy process.   Pt's wife states pt had a wound on his feet approx 2-3 months ago and saw the podiatrist.  PT checked his feet and didn't see any wounds.  Pt's wife is going to contact podiatrist to make sure he is cleared for aquatic therapy.   Person educated: Patient and Spouse Education method: Explanation Education comprehension: verbalized understanding and needs further education  HOME EXERCISE PROGRAM: Did not give today.  Pt has a HEP.  ASSESSMENT:  CLINICAL IMPRESSION: Patient is a 77 y.o. male with a dx of Lumbago with sciatica presenting to the clinic with LBP, muscle weakness in bilat LE's, difficulty in walking, and balance deficits.  PT order indicated aquatic therapy.  MD notes indicated hyperostosis (DISH), spinal and joint pain, and general debility.  Pt has an extensive medical Hx and significant mobility deficits.  Pt has a hx of falling though has not  fallen in the past 6 months.  Though he has not fallen, he also hasn't been very mobile.  Pt states he hasn't taken any steps for 2-3 years except taking a step to ender the shower which is very difficult.  Pt states he is unable to stand and has to use his UE's.  He has significant limitations with standing duration.  Pt may benefit from skilled PT services to address impairments, improve tolerance to activity, and to improve overall function.         OBJECTIVE IMPAIRMENTS: Abnormal gait, decreased activity tolerance, decreased balance, decreased  coordination, decreased endurance, decreased mobility, difficulty walking, decreased ROM, decreased strength, hypomobility, impaired flexibility, postural dysfunction, and pain.   ACTIVITY LIMITATIONS: standing, squatting, stairs, transfers, and locomotion level  PARTICIPATION LIMITATIONS: meal prep, cleaning, laundry, driving, shopping, and community activity  PERSONAL FACTORS: Time since onset of injury/illness/exacerbation and 3+ comorbidities: kyphoplasties, gait imbalance, peripheral neuropathy, thoracic spondylosis, bilat TKA, cervical fusion  are also affecting patient's functional outcome.   REHAB POTENTIAL: Fair Pt has an extensive medical Hx with multiple co-morbidities.  Chronic pain.   CLINICAL DECISION MAKING: Evolving/moderate complexity  EVALUATION COMPLEXITY: Moderate   GOALS:   SHORT TERM GOALS:   Pt will tolerate aquatic therapy without adverse effects for improved mobility, tolerance to activity, and pain.  Baseline: Goal status: INITIAL Target date:  09/08/2022  2.  Pt will able to ambulate 25 feet with CGA with FWW.  Baseline:  Goal status: INITIAL Target date:  09/22/2022  3.  Pt will be able to progress aquatic exercises without adverse effects for improved tolerance to activity, mobility, and pain.  Baseline:  Goal status: INITIAL Target date:  09/29/2022  4.  Pt will be able to perform sit to stand independently.  Baseline:  Goal status: INITIAL Target date:  09/22/2022     LONG TERM GOALS: Target date: 10/20/2022  Pt will report at least a 70% reduction in his lumbar pain.  Baseline:  Goal status: INITIAL  2.  Pt will be able to perform shower transfers without difficulty.  Baseline:  Goal status: INITIAL  3.  Pt will be able to ambulate > 50 ft with no > CGA for improved mobility without significant lumbar pain Baseline:  Goal status: INITIAL  4.  Pt will demo improved bilat LE strength by 7-10 lbs in hip flex, abd, and knee extension for  improved performance of functional mobility.  Baseline:  Goal status: INITIAL    PLAN:  PT FREQUENCY: 2x/week  PT DURATION: 8 weeks  PLANNED INTERVENTIONS: Therapeutic exercises, Therapeutic activity, Neuromuscular re-education, Balance training, Gait training, Patient/Family education, Self Care, Joint mobilization, Stair training, Aquatic Therapy, Dry Needling, Cryotherapy, Moist heat, Taping, Ultrasound, Manual therapy, and Re-evaluation.  PLAN FOR NEXT SESSION: Cont with aquatic therapy.  No e-stim due to spinal cord stimulator.     Selinda Michaels III PT, DPT 08/28/22 9:09 AM

## 2022-08-25 ENCOUNTER — Other Ambulatory Visit: Payer: Self-pay

## 2022-08-25 ENCOUNTER — Ambulatory Visit (HOSPITAL_BASED_OUTPATIENT_CLINIC_OR_DEPARTMENT_OTHER): Payer: Medicare Other | Attending: Chiropractic Medicine | Admitting: Physical Therapy

## 2022-08-25 DIAGNOSIS — M5459 Other low back pain: Secondary | ICD-10-CM

## 2022-08-25 DIAGNOSIS — R278 Other lack of coordination: Secondary | ICD-10-CM | POA: Diagnosis present

## 2022-08-25 DIAGNOSIS — R262 Difficulty in walking, not elsewhere classified: Secondary | ICD-10-CM

## 2022-08-25 DIAGNOSIS — M6281 Muscle weakness (generalized): Secondary | ICD-10-CM

## 2022-08-26 ENCOUNTER — Encounter (HOSPITAL_BASED_OUTPATIENT_CLINIC_OR_DEPARTMENT_OTHER): Payer: Self-pay | Admitting: Physical Therapy

## 2022-09-07 ENCOUNTER — Encounter (HOSPITAL_BASED_OUTPATIENT_CLINIC_OR_DEPARTMENT_OTHER): Payer: Self-pay | Admitting: Physical Therapy

## 2022-09-07 ENCOUNTER — Ambulatory Visit (HOSPITAL_BASED_OUTPATIENT_CLINIC_OR_DEPARTMENT_OTHER): Payer: Medicare Other | Attending: Chiropractic Medicine | Admitting: Physical Therapy

## 2022-09-07 DIAGNOSIS — R262 Difficulty in walking, not elsewhere classified: Secondary | ICD-10-CM | POA: Diagnosis present

## 2022-09-07 DIAGNOSIS — M6281 Muscle weakness (generalized): Secondary | ICD-10-CM

## 2022-09-07 DIAGNOSIS — R278 Other lack of coordination: Secondary | ICD-10-CM | POA: Insufficient documentation

## 2022-09-07 DIAGNOSIS — M5459 Other low back pain: Secondary | ICD-10-CM | POA: Diagnosis present

## 2022-09-07 NOTE — Therapy (Signed)
OUTPATIENT PHYSICAL THERAPY THORACOLUMBAR TREATMENT   Patient Name: [name] MRN: [id number] DOB:[birth year], 77 y.o., male Today's Date: 09/07/2022  END OF SESSION:  PT End of Session - 09/07/22 1002     Visit Number 2    Number of Visits 16    Date for PT Re-Evaluation 10/20/22    Authorization Type UHC MCR    PT Start Time 0903    PT Stop Time 0944    PT Time Calculation (min) 41 min    Behavior During Therapy [facility] for tasks assessed/performed             Past Medical History:  Diagnosis Date   Alcohol use 05/2018   daily   Ankylosing spondylitis of multiple sites in spine [facility])    Chronic pain    Chronically on opiate therapy    Pain Management d/c opiates [id number]   Complication of anesthesia    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 pt has note from [facility] indicating difficulty   First degree AV block    chronic   Gait instability    Hypertension    Multiple falls    OSA (obstructive sleep apnea)    Pain management    Peripheral axonal neuropathy    Peripheral neuropathy 06/06/2018   Tremor    Past Surgical History:  Procedure Laterality Date   ANKLE SURGERY Right    orif   BACK SURGERY     BIOPSY  11/05/2018   Procedure: BIOPSY;  Surgeon: Danie Binder, MD;  [facility];  Service: Endoscopy;;   CHOLECYSTECTOMY     ESOPHAGOGASTRODUODENOSCOPY (EGD) WITH PROPOFOL N/A 11/05/2018   Procedure: ESOPHAGOGASTRODUODENOSCOPY (EGD) WITH PROPOFOL;  Surgeon: Danie Binder, MD;  Location: [facility];  Service: Endoscopy;  Laterality: N/A;  10:30am-rescheduled to 4/7 @ 7:30am per office   JOINT REPLACEMENT Bilateral    knees   NECK SURGERY     cervical fusion   SPINAL CORD STIMULATOR INSERTION  2017   TOTAL KNEE ARTHROPLASTY     Patient Active Problem List   Diagnosis Date Noted   Weakness 09/27/2019   Abnormal CT scan    Nausea and vomiting    Opiate use    Diarrhea    Compression  fracture of T10 vertebra ([place]) 07/25/2018   Duodenitis 07/25/2018   Aspiration pneumonia of both lower lobes due to gastric secretions ([place]) 07/21/2018   Acute respiratory failure with hypoxia ([place]) 07/21/2018   Acute kidney injury ([place]) 07/20/2018   Dehydration 07/20/2018   Opiate overdose ([place]) 07/20/2018   Peripheral neuropathy 06/06/2018   Acute encephalopathy 05/09/2018   Multiple falls 05/08/2018   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  "I just want to be able to get out and about".   Pt's wife, [name], present on deck during treatment.   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 Yes NPRS:  5/10 Location:  Central lumbar Pt has increased pain with rainy weather.  PRECAUTIONS: Fall and  Other: chronic back pain, DISH, prior kyphoplasties, peripheral neuropathy, bilat TKA, cervical suion  WEIGHT BEARING RESTRICTIONS: No  FALLS:  Has patient fallen in last 6 months? No  LIVING ENVIRONMENT: Lives with: lives with their family Lives in: 2 story home Stairs: ramp to enter home.  Pt hasn't gone on the 2nd floor in years.   Has following equipment at home: Gilford Rile - 2 wheeled, Environmental consultant - 4 wheeled, Wheelchair (manual), and shower chair   PLOF: Pt has a hx of chronic lumbar pain and co-morbidities that have limited his ability to perform functional mobility and limited his tolerance to activity.   PATIENT GOALS: to be able stand for > 2-3 sec, to be able to ambulate with holding on to something    OBJECTIVE:   DIAGNOSTIC FINDINGS:  X rays in 2022: Moderate to advanced disc space height loss L5/S1.  Mod to mod dis space height loss t/o the remainder of the lumbar spine.  Prominent anterior osteophytes greatest L3/4 and L1/2.  Mod to advanced multilevel face deg changes. SP deg changes.  IMPRESSION: Multilevel deg changes of the lumbar spine.   MRI in 2015: IMPRESSION: 1. Multilevel degenerative disc disease and facet arthropathy, as above. The L2-L3 disc protrusion may produce left L3 radicular symptoms. The L5-S1 disc protrusion may produce bilateral S1 radicular symptoms, left greater than right. There is severe impingement of the exiting left L5 nerve root. Clinical correlation recommended.   PATIENT SURVEYS:  FOTO 14 with a goal of 35 at visit #14   COGNITION: Overall cognitive status: Within functional limits for tasks assessed      OBSERVATION/POSTURE: Seated in W/C with increased FHP and rounded shoulder.  He is in cervical flexion in sitting.  PT checked bilat feet.  Skin was intact and PT didn't notice any wounds or any scabbing.      LOWER EXTREMITY ROM:     Active  Right eval Left eval  Hip flexion    Hip extension    Hip abduction    Hip  adduction    Hip internal rotation    Hip external rotation    Knee flexion    Knee extension    Ankle dorsiflexion [facility]  Ankle plantarflexion    Ankle inversion    Ankle eversion     (Blank rows = not tested)  LOWER EXTREMITY MMT:    MMT Right eval Left eval  Hip flexion 13.4 13.1  Hip extension    Hip abduction 15.3 15.0  Hip adduction    Hip internal rotation    Hip external rotation    Knee flexion    Knee extension Unable to perform full knee extension seated ; 14.8 Unable to perform full knee extension seated ; 20.4  Ankle dorsiflexion 5/5  5/5  Ankle plantarflexion    Ankle inversion    Ankle eversion     (Blank rows = not tested)    FUNCTIONAL TESTS:  Sit to stand transfer:  min assist.  Pt only able to stand for approx 5 seconds  GAIT: Comments: pt ambulated with FWW x 3 steps with min assist with W/C behind him.  Pt's legs were shaky.  He has bilat knee flexion and has decreased step length bilat.     TODAY'S TREATMENT:                                                                                                                               Pt seen for aquatic therapy today.  Treatment took place in water 3.25-4.5 ft in depth at the [facility]. Temp of water was 91.  Pt entered/exited the pool via chair lift with CGA for safety to/from WC (pt does squat pivot transfer).  *seated in lift chair, submerged: cycling LEs, alternating LAQ, long sitting hip abdct/ addct * holding wall squat to relaxed position and then trying to straight knees/hips x 5 * holding white barbell:  forward / backward gait with CGA to barbell 4 laps; side stepping R/L 1 lap * STS at bench with UE on barbell x 8 * back against wall:  row motion with barbell x 5 reps;  solid noodle pull down to thighs x 5  Pt requires the buoyancy and hydrostatic pressure of water for support, and to offload joints by unweighting joint load by at least 50 % in navel deep water and by  at least 75-80% in chest to neck deep water.  Viscosity of the water is needed for resistance of strengthening. Water current perturbations provides challenge to standing balance requiring increased core activation.    PATIENT EDUCATION:  Education details: reacquainting pt to aquatic therapy Person educated: Patient Education method: Explanation Education comprehension: verbalized understanding and needs further education  HOME EXERCISE PROGRAM: Did not give today.  Pt has a HEP.  ASSESSMENT:  CLINICAL IMPRESSION: Pt is not fearful of water, but due to his limited mobility, requires therapist in the water with him.   He requires min A for forward weight shift and foot placement to get out of lift chair once submerged into water, and CGA/min A with gait in water.  He reported increased back pain with attempts to straight LEs. He is observed walking with flexed knees and hips, and forward head posture.  When he attempts to bring trunk upright he has posterior LOB requiring Min A to correct.   He is given short rest breaks during session, leaning up against wall.   Pt may benefit from skilled PT services to address impairments, improve tolerance to activity, and to improve overall function.         OBJECTIVE IMPAIRMENTS: Abnormal gait, decreased activity tolerance, decreased balance, decreased coordination, decreased endurance, decreased mobility, difficulty walking, decreased ROM, decreased  strength, hypomobility, impaired flexibility, postural dysfunction, and pain.   ACTIVITY LIMITATIONS: standing, squatting, stairs, transfers, and locomotion level  PARTICIPATION LIMITATIONS: meal prep, cleaning, laundry, driving, shopping, and community activity  PERSONAL FACTORS: Time since onset of injury/illness/exacerbation and 3+ comorbidities: kyphoplasties, gait imbalance, peripheral neuropathy, thoracic spondylosis, bilat TKA, cervical fusion  are also affecting patient's functional outcome.    REHAB POTENTIAL: Fair Pt has an extensive medical Hx with multiple co-morbidities.  Chronic pain.   CLINICAL DECISION MAKING: Evolving/moderate complexity  EVALUATION COMPLEXITY: Moderate   GOALS:   SHORT TERM GOALS:   Pt will tolerate aquatic therapy without adverse effects for improved mobility, tolerance to activity, and pain.  Baseline: Goal status: INITIAL Target date:  09/08/2022  2.  Pt will able to ambulate 25 feet with CGA with FWW.  Baseline:  Goal status: INITIAL Target date:  09/22/2022  3.  Pt will be able to progress aquatic exercises without adverse effects for improved tolerance to activity, mobility, and pain.  Baseline:  Goal status: INITIAL Target date:  09/29/2022  4.  Pt will be able to perform sit to stand independently.  Baseline:  Goal status: INITIAL Target date:  09/22/2022     LONG TERM GOALS: Target date: 10/20/2022  Pt will report at least a 70% reduction in his lumbar pain.  Baseline:  Goal status: INITIAL  2.  Pt will be able to perform shower transfers without difficulty.  Baseline:  Goal status: INITIAL  3.  Pt will be able to ambulate > 50 ft with no > CGA for improved mobility without significant lumbar pain Baseline:  Goal status: INITIAL  4.  Pt will demo improved bilat LE strength by 7-10 lbs in hip flex, abd, and knee extension for improved performance of functional mobility.  Baseline:  Goal status: INITIAL    PLAN:  PT FREQUENCY: 2x/week  PT DURATION: 8 weeks  PLANNED INTERVENTIONS: Therapeutic exercises, Therapeutic activity, Neuromuscular re-education, Balance training, Gait training, Patient/Family education, Self Care, Joint mobilization, Stair training, Aquatic Therapy, Dry Needling, Cryotherapy, Moist heat, Taping, Ultrasound, Manual therapy, and Re-evaluation.  PLAN FOR NEXT SESSION: Cont with aquatic therapy.  No e-stim due to spinal cord stimulator.    Kerin Perna, PTA 09/07/22 1:44 PM [facility] [address] [place], [place], [address] Phone: [phone]   Fax:  [phone]

## 2022-09-09 ENCOUNTER — Ambulatory Visit (HOSPITAL_BASED_OUTPATIENT_CLINIC_OR_DEPARTMENT_OTHER): Payer: Medicare Other | Admitting: Physical Therapy

## 2022-09-09 ENCOUNTER — Encounter (HOSPITAL_BASED_OUTPATIENT_CLINIC_OR_DEPARTMENT_OTHER): Payer: Self-pay

## 2022-09-14 ENCOUNTER — Encounter (HOSPITAL_BASED_OUTPATIENT_CLINIC_OR_DEPARTMENT_OTHER): Payer: Self-pay | Admitting: Physical Therapy

## 2022-09-14 ENCOUNTER — Ambulatory Visit (HOSPITAL_BASED_OUTPATIENT_CLINIC_OR_DEPARTMENT_OTHER): Payer: Medicare Other | Admitting: Physical Therapy

## 2022-09-14 DIAGNOSIS — R262 Difficulty in walking, not elsewhere classified: Secondary | ICD-10-CM

## 2022-09-14 DIAGNOSIS — R278 Other lack of coordination: Secondary | ICD-10-CM

## 2022-09-14 DIAGNOSIS — M5459 Other low back pain: Secondary | ICD-10-CM | POA: Diagnosis not present

## 2022-09-14 DIAGNOSIS — M6281 Muscle weakness (generalized): Secondary | ICD-10-CM

## 2022-09-14 NOTE — Therapy (Signed)
OUTPATIENT PHYSICAL THERAPY THORACOLUMBAR TREATMENT   Patient Name: [name] MRN: [id number] DOB:04/03/1946, 77 y.o., male Today's Date: 09/14/2022  END OF SESSION:  PT End of Session - 09/14/22 0825     Visit Number 3    Number of Visits 16    Date for PT Re-Evaluation 10/20/22    Authorization Type UHC MCR    PT Start Time 0815    PT Stop Time 0900    PT Time Calculation (min) 45 min    Activity Tolerance Patient tolerated treatment well    Behavior During Therapy [facility] for tasks assessed/performed              Past Medical History:  Diagnosis Date   Alcohol use 05/2018   daily   Ankylosing spondylitis of multiple sites in spine [facility])    Chronic pain    Chronically on opiate therapy    Pain Management d/c opiates [id number]   Complication of anesthesia    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 pt has note from [facility] indicating difficulty   First degree AV block    chronic   Gait instability    Hypertension    Multiple falls    OSA (obstructive sleep apnea)    Pain management    Peripheral axonal neuropathy    Peripheral neuropathy 06/06/2018   Tremor    Past Surgical History:  Procedure Laterality Date   ANKLE SURGERY Right    orif   BACK SURGERY     BIOPSY  11/05/2018   Procedure: BIOPSY;  Surgeon: Danie Binder, MD;  [facility];  Service: Endoscopy;;   CHOLECYSTECTOMY     ESOPHAGOGASTRODUODENOSCOPY (EGD) WITH PROPOFOL N/A 11/05/2018   Procedure: ESOPHAGOGASTRODUODENOSCOPY (EGD) WITH PROPOFOL;  Surgeon: Danie Binder, MD;  [facility];  Service: Endoscopy;  Laterality: N/A;  10:30am-rescheduled to 4/7 @ 7:30am per office   JOINT REPLACEMENT Bilateral    knees   NECK SURGERY     cervical fusion   SPINAL CORD STIMULATOR INSERTION  2017   TOTAL KNEE ARTHROPLASTY     Patient Active Problem List   Diagnosis Date Noted   Weakness 09/27/2019   Abnormal CT scan    Nausea  and vomiting    Opiate use    Diarrhea    Compression fracture of T10 vertebra ([place]) 07/25/2018   Duodenitis 07/25/2018   Aspiration pneumonia of both lower lobes due to gastric secretions ([place]) 07/21/2018   Acute respiratory failure with hypoxia ([place]) 07/21/2018   Acute kidney injury ([place]) 07/20/2018   Dehydration 07/20/2018   Opiate overdose ([place]) 07/20/2018   Peripheral neuropathy 06/06/2018   Acute encephalopathy 05/09/2018   Multiple falls 05/08/2018   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   OBSTRUCTIVE SLEEP APNEA 08/20/2007   Chronic pain syndrome 07/30/2007   Essential hypertension 07/30/2007   Ankylosing spondylitis ([place]) 07/30/2007     REFERRING PROVIDER: Levy Pupa, PA-C   REFERRING DIAG: M54.40 (ICD-10-CM) - Lumbago with sciatica, unspecified side   Rationale for Evaluation and Treatment: Rehabilitation  THERAPY DIAG:  Other low back pain  Muscle weakness (generalized)  Difficulty in walking, not elsewhere classified  Other lack of coordination  ONSET DATE: Chronic pain ; MD order 06/09/2022  SUBJECTIVE:  SUBJECTIVE STATEMENT: Pt reports hurt so good after last session. Missed last session due to being sick.   Pt's wife, [name], present on deck during treatment.   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 Yes NPRS:  7/10 Location:  Central  lumbar Pt has increased pain with rainy weather.  PRECAUTIONS: Fall and Other: chronic back pain, DISH, prior kyphoplasties, peripheral neuropathy, bilat TKA, cervical suion  WEIGHT BEARING RESTRICTIONS: No  FALLS:  Has patient fallen in last 6 months? No  LIVING ENVIRONMENT: Lives with: lives with their family Lives in: 2 story home Stairs: ramp to enter home.  Pt hasn't gone on the 2nd floor in years.   Has following equipment at home: Gilford Rile - 2 wheeled, Environmental consultant - 4 wheeled, Wheelchair (manual), and shower chair   PLOF: Pt has a hx of chronic lumbar pain and co-morbidities that have limited his ability to perform functional mobility and limited his tolerance to activity.   PATIENT GOALS: to be able stand for > 2-3 sec, to be able to ambulate with holding on to something    OBJECTIVE:   DIAGNOSTIC FINDINGS:  X rays in 2022: Moderate to advanced disc space height loss L5/S1.  Mod to mod dis space height loss t/o the remainder of the lumbar spine.  Prominent anterior osteophytes greatest L3/4 and L1/2.  Mod to advanced multilevel face deg changes. SP deg changes.  IMPRESSION: Multilevel deg changes of the lumbar spine.   MRI in 2015: IMPRESSION: 1. Multilevel degenerative disc disease and facet arthropathy, as above. The L2-L3 disc protrusion may produce left L3 radicular symptoms. The L5-S1 disc protrusion may produce bilateral S1 radicular symptoms, left greater than right. There is severe impingement of the exiting left L5 nerve root. Clinical correlation recommended.   PATIENT SURVEYS:  FOTO 14 with a goal of 35 at visit #14   COGNITION: Overall cognitive status: Within functional limits for tasks assessed      OBSERVATION/POSTURE: Seated in W/C with increased FHP and rounded shoulder.  He is in cervical flexion in sitting.  PT checked bilat feet.  Skin was intact and PT didn't notice any wounds or any scabbing.      LOWER EXTREMITY ROM:     Active   Right eval Left eval  Hip flexion    Hip extension    Hip abduction    Hip adduction    Hip internal rotation    Hip external rotation    Knee flexion    Knee extension    Ankle dorsiflexion [facility]  Ankle plantarflexion    Ankle inversion    Ankle eversion     (Blank rows = not tested)  LOWER EXTREMITY MMT:    MMT Right eval Left eval  Hip flexion 13.4 13.1  Hip extension    Hip abduction 15.3 15.0  Hip adduction    Hip internal rotation    Hip external rotation    Knee flexion    Knee extension Unable to perform full knee extension seated ; 14.8 Unable to perform full knee extension seated ; 20.4  Ankle dorsiflexion 5/5 5/5  Ankle plantarflexion  Ankle inversion    Ankle eversion     (Blank rows = not tested)    FUNCTIONAL TESTS:  Sit to stand transfer:  min assist.  Pt only able to stand for approx 5 seconds  GAIT: Comments: pt ambulated with FWW x 3 steps with min assist with W/C behind him.  Pt's legs were shaky.  He has bilat knee flexion and has decreased step length bilat.     TODAY'S TREATMENT:                                                                                                                               Pt seen for aquatic therapy today.  Treatment took place in water 3.25-4.5 ft in depth at the [place]. Temp of water was 91.  Pt entered/exited the pool via chair lift with CGA for safety to/from WC (pt does squat pivot transfer).  *seated in lift chair, submerged: cycling LEs, alternating LAQ, long sitting hip abdct/ addct * holding wall squat to relaxed position; df; pf x 10 * holding white barbell then water walker varying depths:  forward / backward gait with CGA to barbell ; side stepping R/L multiple widths. Stopping; starting; changing directions. * suspended supin walker: hip extension R/L. VCues and manual cues for targeted muscle (glut/hamstring/LB)   Pt requires the buoyancy and hydrostatic pressure of water  for support, and to offload joints by unweighting joint load by at least 50 % in navel deep water and by at least 75-80% in chest to neck deep water.  Viscosity of the water is needed for resistance of strengthening. Water current perturbations provides challenge to standing balance requiring increased core activation.    PATIENT EDUCATION:  Education details: reacquainting pt to aquatic therapy Person educated: Patient Education method: Explanation Education comprehension: verbalized understanding and needs further education  HOME EXERCISE PROGRAM: Did not give today.  Pt has a HEP.  ASSESSMENT:  CLINICAL IMPRESSION: Pt is not fearful of water, but due to his limited mobility, requires therapist in the water with him.  Pt tolerates todays session very well.  Used water walker to encourage increased indep submerged. Allowed for good positioning for suspended supine to engaging posterior core (glutes, hamstrings and lb) which doubled to stretch hip flex and abdominals.  He reports "hurts so good" with all movement. He is challenged with controlling start and stop with amb forward and back requiring min-mod asst. Cues given for positioning changes for better control. Pt enjoys session and is looking forward to next.  Goals ongoing.     OBJECTIVE IMPAIRMENTS: Abnormal gait, decreased activity tolerance, decreased balance, decreased coordination, decreased endurance, decreased mobility, difficulty walking, decreased ROM, decreased strength, hypomobility, impaired flexibility, postural dysfunction, and pain.   ACTIVITY LIMITATIONS: standing, squatting, stairs, transfers, and locomotion level  PARTICIPATION LIMITATIONS: meal prep, cleaning, laundry, driving, shopping, and community activity  PERSONAL FACTORS: Time since onset of injury/illness/exacerbation and 3+ comorbidities: kyphoplasties, gait  imbalance, peripheral neuropathy, thoracic spondylosis, bilat TKA, cervical fusion  are also  affecting patient's functional outcome.   REHAB POTENTIAL: Fair Pt has an extensive medical Hx with multiple co-morbidities.  Chronic pain.   CLINICAL DECISION MAKING: Evolving/moderate complexity  EVALUATION COMPLEXITY: Moderate   GOALS:   SHORT TERM GOALS:   Pt will tolerate aquatic therapy without adverse effects for improved mobility, tolerance to activity, and pain.  Baseline: Goal status: INITIAL Target date:  09/08/2022  2.  Pt will able to ambulate 25 feet with CGA with FWW.  Baseline:  Goal status: INITIAL Target date:  09/22/2022  3.  Pt will be able to progress aquatic exercises without adverse effects for improved tolerance to activity, mobility, and pain.  Baseline:  Goal status: INITIAL Target date:  09/29/2022  4.  Pt will be able to perform sit to stand independently.  Baseline:  Goal status: INITIAL Target date:  09/22/2022     LONG TERM GOALS: Target date: 10/20/2022  Pt will report at least a 70% reduction in his lumbar pain.  Baseline:  Goal status: INITIAL  2.  Pt will be able to perform shower transfers without difficulty.  Baseline:  Goal status: INITIAL  3.  Pt will be able to ambulate > 50 ft with no > CGA for improved mobility without significant lumbar pain Baseline:  Goal status: INITIAL  4.  Pt will demo improved bilat LE strength by 7-10 lbs in hip flex, abd, and knee extension for improved performance of functional mobility.  Baseline:  Goal status: INITIAL    PLAN:  PT FREQUENCY: 2x/week  PT DURATION: 8 weeks  PLANNED INTERVENTIONS: Therapeutic exercises, Therapeutic activity, Neuromuscular re-education, Balance training, Gait training, Patient/Family education, Self Care, Joint mobilization, Stair training, Aquatic Therapy, Dry Needling, Cryotherapy, Moist heat, Taping, Ultrasound, Manual therapy, and Re-evaluation.  PLAN FOR NEXT SESSION: Cont with aquatic therapy.  No e-stim due to spinal cord stimulator.    [address] [place])  Abelina Ketron MPT 09/14/22 9:21 AM [facility] [address] [place], [place], [address] Phone: [phone]   Fax:  [phone]

## 2022-09-16 ENCOUNTER — Ambulatory Visit (HOSPITAL_BASED_OUTPATIENT_CLINIC_OR_DEPARTMENT_OTHER): Payer: Medicare Other | Admitting: Physical Therapy

## 2022-09-16 ENCOUNTER — Encounter (HOSPITAL_BASED_OUTPATIENT_CLINIC_OR_DEPARTMENT_OTHER): Payer: Self-pay | Admitting: Physical Therapy

## 2022-09-16 DIAGNOSIS — M5459 Other low back pain: Secondary | ICD-10-CM | POA: Diagnosis not present

## 2022-09-16 DIAGNOSIS — M6281 Muscle weakness (generalized): Secondary | ICD-10-CM

## 2022-09-16 DIAGNOSIS — R262 Difficulty in walking, not elsewhere classified: Secondary | ICD-10-CM

## 2022-09-16 NOTE — Therapy (Signed)
OUTPATIENT PHYSICAL THERAPY THORACOLUMBAR TREATMENT   Patient Name: [name] MRN: [id number] DOB:[birth year], 77 y.o., male Today's Date: 09/16/2022  END OF SESSION:  PT End of Session - 09/16/22 0852     Visit Number 4    Number of Visits 16    Date for PT Re-Evaluation 10/20/22    Authorization Type UHC MCR    PT Start Time 0852    PT Stop Time 0930    PT Time Calculation (min) 38 min    Activity Tolerance Patient tolerated treatment well              Past Medical History:  Diagnosis Date   Alcohol use 05/2018   daily   Ankylosing spondylitis of multiple sites in spine [facility])    Chronic pain    Chronically on opiate therapy    Pain Management d/c opiates [id number]   Complication of anesthesia    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 pt has note from [facility] indicating difficulty   First degree AV block    chronic   Gait instability    Hypertension    Multiple falls    OSA (obstructive sleep apnea)    Pain management    Peripheral axonal neuropathy    Peripheral neuropathy 06/06/2018   Tremor    Past Surgical History:  Procedure Laterality Date   ANKLE SURGERY Right    orif   BACK SURGERY     BIOPSY  11/05/2018   Procedure: BIOPSY;  Surgeon: Danie Binder, MD;  [facility];  Service: Endoscopy;;   CHOLECYSTECTOMY     ESOPHAGOGASTRODUODENOSCOPY (EGD) WITH PROPOFOL N/A 11/05/2018   Procedure: ESOPHAGOGASTRODUODENOSCOPY (EGD) WITH PROPOFOL;  Surgeon: Danie Binder, MD;  [facility];  Service: Endoscopy;  Laterality: N/A;  10:30am-rescheduled to 4/7 @ 7:30am per office   JOINT REPLACEMENT Bilateral    knees   NECK SURGERY     cervical fusion   SPINAL CORD STIMULATOR INSERTION  2017   TOTAL KNEE ARTHROPLASTY     Patient Active Problem List   Diagnosis Date Noted   Weakness 09/27/2019   Abnormal CT scan    Nausea and vomiting    Opiate use    Diarrhea    Compression  fracture of T10 vertebra ([place]) 07/25/2018   Duodenitis 07/25/2018   Aspiration pneumonia of both lower lobes due to gastric secretions ([place]) 07/21/2018   Acute respiratory failure with hypoxia ([place]) 07/21/2018   Acute kidney injury ([place]) 07/20/2018   Dehydration 07/20/2018   Opiate overdose ([place]) 07/20/2018   Peripheral neuropathy 06/06/2018   Acute encephalopathy 05/09/2018   Multiple falls 05/08/2018   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   OBSTRUCTIVE SLEEP APNEA 08/20/2007   Chronic pain syndrome 07/30/2007   Essential hypertension 07/30/2007   Ankylosing spondylitis ([place]) 07/30/2007     REFERRING PROVIDER: Levy Pupa, PA-C   REFERRING DIAG: M54.40 (ICD-10-CM) - Lumbago with sciatica, unspecified side   Rationale for Evaluation and Treatment: Rehabilitation  THERAPY DIAG:  Other low back pain  Muscle weakness (generalized)  Difficulty in walking, not elsewhere classified  ONSET DATE: Chronic pain ; MD order 06/09/2022  SUBJECTIVE:  SUBJECTIVE STATEMENT: Pt reports he did have soreness after last session, but did not get sick afterwards.    Pt's wife, [name], present on deck during treatment.   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 Yes NPRS:  6/10 Location:  Central lumbar Pt has increased pain with rainy weather.  PRECAUTIONS: Fall and Other:  chronic back pain, DISH, prior kyphoplasties, peripheral neuropathy, bilat TKA, cervical suion  WEIGHT BEARING RESTRICTIONS: No  FALLS:  Has patient fallen in last 6 months? No  LIVING ENVIRONMENT: Lives with: lives with their family Lives in: 2 story home Stairs: ramp to enter home.  Pt hasn't gone on the 2nd floor in years.   Has following equipment at home: Gilford Rile - 2 wheeled, Environmental consultant - 4 wheeled, Wheelchair (manual), and shower chair   PLOF: Pt has a hx of chronic lumbar pain and co-morbidities that have limited his ability to perform functional mobility and limited his tolerance to activity.   PATIENT GOALS: to be able stand for > 2-3 sec, to be able to ambulate with holding on to something    OBJECTIVE:   DIAGNOSTIC FINDINGS:  X rays in 2022: Moderate to advanced disc space height loss L5/S1.  Mod to mod dis space height loss t/o the remainder of the lumbar spine.  Prominent anterior osteophytes greatest L3/4 and L1/2.  Mod to advanced multilevel face deg changes. SP deg changes.  IMPRESSION: Multilevel deg changes of the lumbar spine.   MRI in 2015: IMPRESSION: 1. Multilevel degenerative disc disease and facet arthropathy, as above. The L2-L3 disc protrusion may produce left L3 radicular symptoms. The L5-S1 disc protrusion may produce bilateral S1 radicular symptoms, left greater than right. There is severe impingement of the exiting left L5 nerve root. Clinical correlation recommended.   PATIENT SURVEYS:  FOTO 14 with a goal of 35 at visit #14   COGNITION: Overall cognitive status: Within functional limits for tasks assessed      OBSERVATION/POSTURE: Seated in W/C with increased FHP and rounded shoulder.  He is in cervical flexion in sitting.  PT checked bilat feet.  Skin was intact and PT didn't notice any wounds or any scabbing.      LOWER EXTREMITY ROM:     Active  Right eval Left eval  Hip flexion    Hip extension    Hip abduction    Hip adduction     Hip internal rotation    Hip external rotation    Knee flexion    Knee extension    Ankle dorsiflexion [facility]  Ankle plantarflexion    Ankle inversion    Ankle eversion     (Blank rows = not tested)  LOWER EXTREMITY MMT:    MMT Right eval Left eval  Hip flexion 13.4 13.1  Hip extension    Hip abduction 15.3 15.0  Hip adduction    Hip internal rotation    Hip external rotation    Knee flexion    Knee extension Unable to perform full knee extension seated ; 14.8 Unable to perform full knee extension seated ; 20.4  Ankle dorsiflexion 5/5 5/5  Ankle plantarflexion  Ankle inversion    Ankle eversion     (Blank rows = not tested)    FUNCTIONAL TESTS:  Sit to stand transfer:  min assist.  Pt only able to stand for approx 5 seconds  GAIT: Comments: pt ambulated with FWW x 3 steps with min assist with W/C behind him.  Pt's legs were shaky.  He has bilat knee flexion and has decreased step length bilat.     TODAY'S TREATMENT:                                                                                                                               Pt seen for aquatic therapy today.  Treatment took place in water 3.5-4 ft in depth at the [facility]. Temp of water was 91.  Pt entered/exited the pool via chair lift with CGA for safety to/from WC (pt does squat-step- pivot transfer).  *seated in lift chair, submerged: cycling LEs, alternating LAQ, long sitting hip abdct/ addct * UE on water walker varying depths:  forward / backward gait with CGA to barbell ; side stepping R/L multiple widths. Stopping; starting; changing directions. * suspended supine in water walker with yellow noodle behind head: hip extension R/L. VC and tactile cues for targeted muscle (glut/hamstring/LB);  PF/DF; hip abdct/ addct  * STS at bench (while UE on water walker) x 3;  repeated with feet on blue step - working on standing balance once up (without UE support) x 5 - up to 8 sec *  return to walking forward / backward in water walker in 4 ft  Pt requires the buoyancy and hydrostatic pressure of water for support, and to offload joints by unweighting joint load by at least 50 % in navel deep water and by at least 75-80% in chest to neck deep water.  Viscosity of the water is needed for resistance of strengthening. Water current perturbations provides challenge to standing balance requiring increased core activation.    PATIENT EDUCATION:  Education details: reacquainting pt to aquatic therapy Person educated: Patient Education method: Explanation Education comprehension: verbalized understanding and needs further education  HOME EXERCISE PROGRAM: Did not give today.  Pt has a HEP.  ASSESSMENT:  CLINICAL IMPRESSION: Pt is not fearful of water, but due to his limited mobility, requires therapist in the water with him.  Pt tolerates todays session very well.  With water walker, pt able to complete some of gait with SBA instead of CGA to water walker.  Occasionally needs CGA due to posterior loss of balance. Pt requires min A for positional changes - STS from lift chair and supine to standing.  Pt reports some reduction of pain while submerged in water. He is able to stand unsupported after STS on blue step, next to bench for 5-8sec intervals before returning to sit. Goals ongoing.     OBJECTIVE IMPAIRMENTS: Abnormal gait, decreased activity tolerance, decreased balance, decreased coordination, decreased endurance, decreased mobility,  difficulty walking, decreased ROM, decreased strength, hypomobility, impaired flexibility, postural dysfunction, and pain.   ACTIVITY LIMITATIONS: standing, squatting, stairs, transfers, and locomotion level  PARTICIPATION LIMITATIONS: meal prep, cleaning, laundry, driving, shopping, and community activity  PERSONAL FACTORS: Time since onset of injury/illness/exacerbation and 3+ comorbidities: kyphoplasties, gait imbalance, peripheral  neuropathy, thoracic spondylosis, bilat TKA, cervical fusion  are also affecting patient's functional outcome.   REHAB POTENTIAL: Fair Pt has an extensive medical Hx with multiple co-morbidities.  Chronic pain.   CLINICAL DECISION MAKING: Evolving/moderate complexity  EVALUATION COMPLEXITY: Moderate   GOALS:   SHORT TERM GOALS:   Pt will tolerate aquatic therapy without adverse effects for improved mobility, tolerance to activity, and pain.  Baseline: Goal status: INITIAL Target date:  09/08/2022  2.  Pt will able to ambulate 25 feet with CGA with FWW.  Baseline:  Goal status: INITIAL Target date:  09/22/2022  3.  Pt will be able to progress aquatic exercises without adverse effects for improved tolerance to activity, mobility, and pain.  Baseline:  Goal status: INITIAL Target date:  09/29/2022  4.  Pt will be able to perform sit to stand independently.  Baseline:  Goal status: INITIAL Target date:  09/22/2022     LONG TERM GOALS: Target date: 10/20/2022  Pt will report at least a 70% reduction in his lumbar pain.  Baseline:  Goal status: INITIAL  2.  Pt will be able to perform shower transfers without difficulty.  Baseline:  Goal status: INITIAL  3.  Pt will be able to ambulate > 50 ft with no > CGA for improved mobility without significant lumbar pain Baseline:  Goal status: INITIAL  4.  Pt will demo improved bilat LE strength by 7-10 lbs in hip flex, abd, and knee extension for improved performance of functional mobility.  Baseline:  Goal status: INITIAL    PLAN:  PT FREQUENCY: 2x/week  PT DURATION: 8 weeks  PLANNED INTERVENTIONS: Therapeutic exercises, Therapeutic activity, Neuromuscular re-education, Balance training, Gait training, Patient/Family education, Self Care, Joint mobilization, Stair training, Aquatic Therapy, Dry Needling, Cryotherapy, Moist heat, Taping, Ultrasound, Manual therapy, and Re-evaluation.  PLAN FOR NEXT SESSION: Cont with aquatic  therapy.  No e-stim due to spinal cord stimulator.    Kerin Perna, PTA 09/16/22 1:22 PM [facility] [address] [place], [place], [address] Phone: [phone]   Fax:  [phone]

## 2022-09-19 ENCOUNTER — Encounter (HOSPITAL_BASED_OUTPATIENT_CLINIC_OR_DEPARTMENT_OTHER): Payer: Self-pay | Admitting: Physical Therapy

## 2022-09-19 ENCOUNTER — Ambulatory Visit (HOSPITAL_BASED_OUTPATIENT_CLINIC_OR_DEPARTMENT_OTHER): Payer: Medicare Other | Admitting: Physical Therapy

## 2022-09-19 DIAGNOSIS — M5459 Other low back pain: Secondary | ICD-10-CM | POA: Diagnosis not present

## 2022-09-19 DIAGNOSIS — R262 Difficulty in walking, not elsewhere classified: Secondary | ICD-10-CM

## 2022-09-19 DIAGNOSIS — R278 Other lack of coordination: Secondary | ICD-10-CM

## 2022-09-19 DIAGNOSIS — M6281 Muscle weakness (generalized): Secondary | ICD-10-CM

## 2022-09-19 NOTE — Therapy (Signed)
OUTPATIENT PHYSICAL THERAPY THORACOLUMBAR TREATMENT   Patient Name: [name] MRN: [id number] DOB:Feb 09, 1946, 76 y.o., male Today's Date: 09/19/2022  END OF SESSION:  PT End of Session - 09/19/22 0942     Visit Number 5    Number of Visits 16    Date for PT Re-Evaluation 10/20/22    Authorization Type UHC MCR    PT Start Time [id number]    PT Stop Time 1030    PT Time Calculation (min) 45 min    Activity Tolerance Patient tolerated treatment well    Behavior During Therapy [facility] for tasks assessed/performed              Past Medical History:  Diagnosis Date   Alcohol use 05/2018   daily   Ankylosing spondylitis of multiple sites in spine [facility])    Chronic pain    Chronically on opiate therapy    Pain Management d/c opiates [id number]   Complication of anesthesia    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 pt has note from [facility] indicating difficulty   First degree AV block    chronic   Gait instability    Hypertension    Multiple falls    OSA (obstructive sleep apnea)    Pain management    Peripheral axonal neuropathy    Peripheral neuropathy 06/06/2018   Tremor    Past Surgical History:  Procedure Laterality Date   ANKLE SURGERY Right    orif   BACK SURGERY     BIOPSY  11/05/2018   Procedure: BIOPSY;  Surgeon: Danie Binder, MD;  [facility];  Service: Endoscopy;;   CHOLECYSTECTOMY     ESOPHAGOGASTRODUODENOSCOPY (EGD) WITH PROPOFOL N/A 11/05/2018   Procedure: ESOPHAGOGASTRODUODENOSCOPY (EGD) WITH PROPOFOL;  Surgeon: Danie Binder, MD;  [facility];  Service: Endoscopy;  Laterality: N/A;  10:30am-rescheduled to 4/7 @ 7:30am per office   JOINT REPLACEMENT Bilateral    knees   NECK SURGERY     cervical fusion   SPINAL CORD STIMULATOR INSERTION  2017   TOTAL KNEE ARTHROPLASTY     Patient Active Problem List   Diagnosis Date Noted   Weakness 09/27/2019   Abnormal CT scan    Nausea  and vomiting    Opiate use    Diarrhea    Compression fracture of T10 vertebra ([place]) 07/25/2018   Duodenitis 07/25/2018   Aspiration pneumonia of both lower lobes due to gastric secretions ([place]) 07/21/2018   Acute respiratory failure with hypoxia ([place]) 07/21/2018   Acute kidney injury ([place]) 07/20/2018   Dehydration 07/20/2018   Opiate overdose ([place]) 07/20/2018   Peripheral neuropathy 06/06/2018   Acute encephalopathy 05/09/2018   Multiple falls 05/08/2018   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   OBSTRUCTIVE SLEEP APNEA 08/20/2007   Chronic pain syndrome 07/30/2007   Essential hypertension 07/30/2007   Ankylosing spondylitis ([place]) 07/30/2007     REFERRING PROVIDER: Levy Pupa, PA-C   REFERRING DIAG: M54.40 (ICD-10-CM) - Lumbago with sciatica, unspecified side   Rationale for Evaluation and Treatment: Rehabilitation  THERAPY DIAG:  Other low back pain  Muscle weakness (generalized)  Difficulty in walking, not elsewhere classified  Other lack of coordination  ONSET DATE: Chronic pain ; MD order 06/09/2022  SUBJECTIVE:  SUBJECTIVE STATEMENT: Pt reports feeling good after last session.   Pt's wife, [name], present on deck during treatment.   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 Yes NPRS:  6/10 Location:  Central lumbar Pt has increased pain with  rainy weather.  PRECAUTIONS: Fall and Other: chronic back pain, DISH, prior kyphoplasties, peripheral neuropathy, bilat TKA, cervical suion  WEIGHT BEARING RESTRICTIONS: No  FALLS:  Has patient fallen in last 6 months? No  LIVING ENVIRONMENT: Lives with: lives with their family Lives in: 2 story home Stairs: ramp to enter home.  Pt hasn't gone on the 2nd floor in years.   Has following equipment at home: Gilford Rile - 2 wheeled, Environmental consultant - 4 wheeled, Wheelchair (manual), and shower chair   PLOF: Pt has a hx of chronic lumbar pain and co-morbidities that have limited his ability to perform functional mobility and limited his tolerance to activity.   PATIENT GOALS: to be able stand for > 2-3 sec, to be able to ambulate with holding on to something    OBJECTIVE:   DIAGNOSTIC FINDINGS:  X rays in 2022: Moderate to advanced disc space height loss L5/S1.  Mod to mod dis space height loss t/o the remainder of the lumbar spine.  Prominent anterior osteophytes greatest L3/4 and L1/2.  Mod to advanced multilevel face deg changes. SP deg changes.  IMPRESSION: Multilevel deg changes of the lumbar spine.   MRI in 2015: IMPRESSION: 1. Multilevel degenerative disc disease and facet arthropathy, as above. The L2-L3 disc protrusion may produce left L3 radicular symptoms. The L5-S1 disc protrusion may produce bilateral S1 radicular symptoms, left greater than right. There is severe impingement of the exiting left L5 nerve root. Clinical correlation recommended.   PATIENT SURVEYS:  FOTO 14 with a goal of 35 at visit #14   COGNITION: Overall cognitive status: Within functional limits for tasks assessed      OBSERVATION/POSTURE: Seated in W/C with increased FHP and rounded shoulder.  He is in cervical flexion in sitting.  PT checked bilat feet.  Skin was intact and PT didn't notice any wounds or any scabbing.      LOWER EXTREMITY ROM:     Active  Right eval Left eval  Hip flexion    Hip  extension    Hip abduction    Hip adduction    Hip internal rotation    Hip external rotation    Knee flexion    Knee extension    Ankle dorsiflexion [facility]  Ankle plantarflexion    Ankle inversion    Ankle eversion     (Blank rows = not tested)  LOWER EXTREMITY MMT:    MMT Right eval Left eval  Hip flexion 13.4 13.1  Hip extension    Hip abduction 15.3 15.0  Hip adduction    Hip internal rotation    Hip external rotation    Knee flexion    Knee extension Unable to perform full knee extension seated ; 14.8 Unable to perform full knee extension seated ; 20.4  Ankle dorsiflexion 5/5 5/5  Ankle plantarflexion    Ankle inversion    Ankle  eversion     (Blank rows = not tested)    FUNCTIONAL TESTS:  Sit to stand transfer:  min assist.  Pt only able to stand for approx 5 seconds  GAIT: Comments: pt ambulated with FWW x 3 steps with min assist with W/C behind him.  Pt's legs were shaky.  He has bilat knee flexion and has decreased step length bilat.     TODAY'S TREATMENT:                                                                                                                               Pt seen for aquatic therapy today.  Treatment took place in water 3.5-4 ft in depth at the [facility]. Temp of water was 91.  Pt entered/exited the pool via chair lift with CGA for safety to/from WC (pt does squat-step- pivot transfer).  *seated in lift chair, submerged: cycling LEs, alternating LAQ, long sitting hip abdct/ addct * UE on water walker varying depths:  forward / backward gait with CGA to barbell ; side stepping R/L multiple widths. Stopping; starting; changing directions. * suspended supine in water walker with yellow noodle behind head: hip extension R/L. VC and tactile cues for targeted muscle (glut/hamstring/LB);  PF/DF; hip abdct/ addct  * STS at bench (while UE on water walker) onto blue step; working on immediate standing balance then static  balance. Maintains position x8-10s best * Standing ue support on wall: df; pf; add/abd and hip extension x5-8.  Pt requires the buoyancy and hydrostatic pressure of water for support, and to offload joints by unweighting joint load by at least 50 % in navel deep water and by at least 75-80% in chest to neck deep water.  Viscosity of the water is needed for resistance of strengthening. Water current perturbations provides challenge to standing balance requiring increased core activation.    PATIENT EDUCATION:  Education details: reacquainting pt to aquatic therapy Person educated: Patient Education method: Explanation Education comprehension: verbalized understanding and needs further education  HOME EXERCISE PROGRAM: Did not give today.  Pt has a HEP.  ASSESSMENT:  CLINICAL IMPRESSION: Pt is not fearful of water, but due to his limited mobility, requires therapist in the water with him.  Pt with good toleration to progression of therapy working posterior core strength and le engagement with balance. Pt with improved balance ability submerged. Some difficulty with stopping movement quickly but is able to change directions indep given extra time. Goals ongoing      OBJECTIVE IMPAIRMENTS: Abnormal gait, decreased activity tolerance, decreased balance, decreased coordination, decreased endurance, decreased mobility, difficulty walking, decreased ROM, decreased strength, hypomobility, impaired flexibility, postural dysfunction, and pain.   ACTIVITY LIMITATIONS: standing, squatting, stairs, transfers, and locomotion level  PARTICIPATION LIMITATIONS: meal prep, cleaning, laundry, driving, shopping, and community activity  PERSONAL FACTORS: Time since onset of injury/illness/exacerbation and 3+ comorbidities: kyphoplasties, gait imbalance, peripheral neuropathy, thoracic spondylosis, bilat TKA, cervical fusion  are  also affecting patient's functional outcome.   REHAB POTENTIAL: Fair Pt has  an extensive medical Hx with multiple co-morbidities.  Chronic pain.   CLINICAL DECISION MAKING: Evolving/moderate complexity  EVALUATION COMPLEXITY: Moderate   GOALS:   SHORT TERM GOALS:   Pt will tolerate aquatic therapy without adverse effects for improved mobility, tolerance to activity, and pain.  Baseline: Goal status: INITIAL Target date:  09/08/2022  2.  Pt will able to ambulate 25 feet with CGA with FWW.  Baseline:  Goal status: INITIAL Target date:  09/22/2022  3.  Pt will be able to progress aquatic exercises without adverse effects for improved tolerance to activity, mobility, and pain.  Baseline:  Goal status: INITIAL Target date:  09/29/2022  4.  Pt will be able to perform sit to stand independently.  Baseline:  Goal status: INITIAL Target date:  09/22/2022     LONG TERM GOALS: Target date: 10/20/2022  Pt will report at least a 70% reduction in his lumbar pain.  Baseline:  Goal status: INITIAL  2.  Pt will be able to perform shower transfers without difficulty.  Baseline:  Goal status: INITIAL  3.  Pt will be able to ambulate > 50 ft with no > CGA for improved mobility without significant lumbar pain Baseline:  Goal status: INITIAL  4.  Pt will demo improved bilat LE strength by 7-10 lbs in hip flex, abd, and knee extension for improved performance of functional mobility.  Baseline:  Goal status: INITIAL    PLAN:  PT FREQUENCY: 2x/week  PT DURATION: 8 weeks  PLANNED INTERVENTIONS: Therapeutic exercises, Therapeutic activity, Neuromuscular re-education, Balance training, Gait training, Patient/Family education, Self Care, Joint mobilization, Stair training, Aquatic Therapy, Dry Needling, Cryotherapy, Moist heat, Taping, Ultrasound, Manual therapy, and Re-evaluation.  PLAN FOR NEXT SESSION: Cont with aquatic therapy.  No e-stim due to spinal cord stimulator.    Stanton Kidney [place]) Midge Momon MPT 09/19/22 9:43 AM [facility] [address] [place], [place], [address] Phone: [phone]   Fax:  [phone]

## 2022-09-21 ENCOUNTER — Encounter (HOSPITAL_BASED_OUTPATIENT_CLINIC_OR_DEPARTMENT_OTHER): Payer: Self-pay | Admitting: Physical Therapy

## 2022-09-21 ENCOUNTER — Ambulatory Visit (HOSPITAL_BASED_OUTPATIENT_CLINIC_OR_DEPARTMENT_OTHER): Payer: Medicare Other | Admitting: Physical Therapy

## 2022-09-21 DIAGNOSIS — M6281 Muscle weakness (generalized): Secondary | ICD-10-CM

## 2022-09-21 DIAGNOSIS — M5459 Other low back pain: Secondary | ICD-10-CM

## 2022-09-21 DIAGNOSIS — R278 Other lack of coordination: Secondary | ICD-10-CM

## 2022-09-21 DIAGNOSIS — R262 Difficulty in walking, not elsewhere classified: Secondary | ICD-10-CM

## 2022-09-21 NOTE — Therapy (Signed)
OUTPATIENT PHYSICAL THERAPY THORACOLUMBAR TREATMENT   Patient Name: [name] MRN: [id number] DOB:1945/07/18, 77 y.o., male Today's Date: 09/21/2022  END OF SESSION:  PT End of Session - 09/21/22 1158     Visit Number 6    Number of Visits 16    Date for PT Re-Evaluation 10/20/22    Authorization Type UHC MCR    PT Start Time 1200    PT Stop Time 1245    PT Time Calculation (min) 45 min    Activity Tolerance Patient tolerated treatment well    Behavior During Therapy WFL for tasks assessed/performed              Past Medical History:  Diagnosis Date   Alcohol use 05/2018   daily   Ankylosing spondylitis of multiple sites in spine [facility])    Chronic pain    Chronically on opiate therapy    Pain Management d/c opiates [id number]   Complication of anesthesia    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 pt has note from [facility] indicating difficulty   First degree AV block    chronic   Gait instability    Hypertension    Multiple falls    OSA (obstructive sleep apnea)    Pain management    Peripheral axonal neuropathy    Peripheral neuropathy 06/06/2018   Tremor    Past Surgical History:  Procedure Laterality Date   ANKLE SURGERY Right    orif   BACK SURGERY     BIOPSY  11/05/2018   Procedure: BIOPSY;  Surgeon: Danie Binder, MD;  [facility];  Service: Endoscopy;;   CHOLECYSTECTOMY     ESOPHAGOGASTRODUODENOSCOPY (EGD) WITH PROPOFOL N/A 11/05/2018   Procedure: ESOPHAGOGASTRODUODENOSCOPY (EGD) WITH PROPOFOL;  Surgeon: Danie Binder, MD;  [facility];  Service: Endoscopy;  Laterality: N/A;  10:30am-rescheduled to 4/7 @ 7:30am per office   JOINT REPLACEMENT Bilateral    knees   NECK SURGERY     cervical fusion   SPINAL CORD STIMULATOR INSERTION  2017   TOTAL KNEE ARTHROPLASTY     Patient Active Problem List   Diagnosis Date Noted   Weakness 09/27/2019   Abnormal CT scan    Nausea  and vomiting    Opiate use    Diarrhea    Compression fracture of T10 vertebra ([place]) 07/25/2018   Duodenitis 07/25/2018   Aspiration pneumonia of both lower lobes due to gastric secretions ([place]) 07/21/2018   Acute respiratory failure with hypoxia ([place]) 07/21/2018   Acute kidney injury ([place]) 07/20/2018   Dehydration 07/20/2018   Opiate overdose ([place]) 07/20/2018   Peripheral neuropathy 06/06/2018   Acute encephalopathy 05/09/2018   Multiple falls 05/08/2018   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   OBSTRUCTIVE SLEEP APNEA 08/20/2007   Chronic pain syndrome 07/30/2007   Essential hypertension 07/30/2007   Ankylosing spondylitis ([place]) 07/30/2007     REFERRING PROVIDER: Levy Pupa, PA-C   REFERRING DIAG: M54.40 (ICD-10-CM) - Lumbago with sciatica, unspecified side   Rationale for Evaluation and Treatment: Rehabilitation  THERAPY DIAG:  Other low back pain  Muscle weakness (generalized)  Difficulty in walking, not elsewhere classified  Other lack of coordination  ONSET DATE: Chronic pain ; MD order 06/09/2022  SUBJECTIVE:  SUBJECTIVE STATEMENT: Pt reports a little soreness after last session but not bad   PERTINENT HISTORY:  -Hx of Chronic back pain, spinal cord stimulator, thoracic spondylosis  -Diffuse idiopathic skeletal hyperostosis (DISH)/ankylosing spondylitis -Prior compression Fx at T6-T7, T10 resulting in kyphoplasties for 2 of them on 11/01/19 -Gait imbalance and falls-followed by neurology -EMG/NCS showed axonal polyneuropathy and peripheral neuropathy without lumbosacral radiculopathy per MD notes -Bilat TKA, cervical fusion, and R Ankle surgery   PAIN:  Are you having pain? Yes NPRS:  7/10 arriving; 0/10 submerged; 4/10 leaving Location:  Central lumbar Pt has increased pain with  rainy weather.  PRECAUTIONS: Fall and Other: chronic back pain, DISH, prior kyphoplasties, peripheral neuropathy, bilat TKA, cervical suion  WEIGHT BEARING RESTRICTIONS: No  FALLS:  Has patient fallen in last 6 months? No  LIVING ENVIRONMENT: Lives with: lives with their family Lives in: 2 story home Stairs: ramp to enter home.  Pt hasn't gone on the 2nd floor in years.   Has following equipment at home: Gilford Rile - 2 wheeled, Environmental consultant - 4 wheeled, Wheelchair (manual), and shower chair   PLOF: Pt has a hx of chronic lumbar pain and co-morbidities that have limited his ability to perform functional mobility and limited his tolerance to activity.   PATIENT GOALS: to be able stand for > 2-3 sec, to be able to ambulate with holding on to something    OBJECTIVE:   DIAGNOSTIC FINDINGS:  X rays in 2022: Moderate to advanced disc space height loss L5/S1.  Mod to mod dis space height loss t/o the remainder of the lumbar spine.  Prominent anterior osteophytes greatest L3/4 and L1/2.  Mod to advanced multilevel face deg changes. SP deg changes.  IMPRESSION: Multilevel deg changes of the lumbar spine.   MRI in 2015: IMPRESSION: 1. Multilevel degenerative disc disease and facet arthropathy, as above. The L2-L3 disc protrusion may produce left L3 radicular symptoms. The L5-S1 disc protrusion may produce bilateral S1 radicular symptoms, left greater than right. There is severe impingement of the exiting left L5 nerve root. Clinical correlation recommended.   PATIENT SURVEYS:  FOTO 14 with a goal of 35 at visit #14   COGNITION: Overall cognitive status: Within functional limits for tasks assessed      OBSERVATION/POSTURE: Seated in W/C with increased FHP and rounded shoulder.  He is in cervical flexion in sitting.  PT checked bilat feet.  Skin was intact and PT didn't notice any wounds or any scabbing.      LOWER EXTREMITY ROM:     Active  Right eval Left eval  Hip flexion    Hip  extension    Hip abduction    Hip adduction    Hip internal rotation    Hip external rotation    Knee flexion    Knee extension    Ankle dorsiflexion [facility]  Ankle plantarflexion    Ankle inversion    Ankle eversion     (Blank rows = not tested)  LOWER EXTREMITY MMT:    MMT Right eval Left eval  Hip flexion 13.4 13.1  Hip extension    Hip abduction 15.3 15.0  Hip adduction    Hip internal rotation    Hip external rotation    Knee flexion    Knee extension Unable to perform full knee extension seated ; 14.8 Unable to perform full knee extension seated ; 20.4  Ankle dorsiflexion 5/5 5/5  Ankle plantarflexion    Ankle inversion    Ankle eversion     (  Blank rows = not tested)    FUNCTIONAL TESTS:  Sit to stand transfer:  min assist.  Pt only able to stand for approx 5 seconds  GAIT: Comments: pt ambulated with FWW x 3 steps with min assist with W/C behind him.  Pt's legs were shaky.  He has bilat knee flexion and has decreased step length bilat.     TODAY'S TREATMENT:                                                                                                                               Pt seen for aquatic therapy today.  Treatment took place in water 3.5-4 ft in depth at the [place]. Temp of water was 91.  Pt entered/exited the pool via chair lift with CGA for safety to/from WC (pt does squat-step- pivot transfer).  *seated in lift chair, submerged: cycling LEs, alternating LAQ, long sitting hip abdct/ addct * UE on water walker varying depths indep:  forward / backward gait ; side stepping R/L multiple widths.    - Stopping; starting; changing directions for balance * suspended supine in water walker using nekdoodle and other noodles to support under knees and waist: hip extension R/L; knee flex R/L. Added ankle foam and repeated. VC and tactile cues for targeted muscle (glut/hamstring/LB) and for breathing;   hip abdct/ addct 3 x20 reps cues for  increased speed for increased resistance; cycling *walking forward; back and side stepping return    Pt requires the buoyancy and hydrostatic pressure of water for support, and to offload joints by unweighting joint load by at least 50 % in navel deep water and by at least 75-80% in chest to neck deep water.  Viscosity of the water is needed for resistance of strengthening. Water current perturbations provides challenge to standing balance requiring increased core activation.    PATIENT EDUCATION:  Education details: reacquainting pt to aquatic therapy Person educated: Patient Education method: Explanation Education comprehension: verbalized understanding and needs further education  HOME EXERCISE PROGRAM: Did not give today.  Pt has a HEP.  ASSESSMENT:  CLINICAL IMPRESSION: Pt is not fearful of water, but due to his limited mobility, requires therapist in the water with him.   Pt trials indep in water walking with water walker. He is unafraid and completes very well, allowing for an increased sense of independence/wellbeing. Tolerates progression of hip extension and knee flex strengthening with increased resistance of ankle cuffs. Demonstrates improvement with standing balance and control with walking and changing directions with submerged walking. Pt greatly enjoys session.       OBJECTIVE IMPAIRMENTS: Abnormal gait, decreased activity tolerance, decreased balance, decreased coordination, decreased endurance, decreased mobility, difficulty walking, decreased ROM, decreased strength, hypomobility, impaired flexibility, postural dysfunction, and pain.   ACTIVITY LIMITATIONS: standing, squatting, stairs, transfers, and locomotion level  PARTICIPATION LIMITATIONS: meal prep, cleaning, laundry, driving, shopping, and community activity  PERSONAL FACTORS: Time since onset of injury/illness/exacerbation and  3+ comorbidities: kyphoplasties, gait imbalance, peripheral neuropathy,  thoracic spondylosis, bilat TKA, cervical fusion  are also affecting patient's functional outcome.   REHAB POTENTIAL: Fair Pt has an extensive medical Hx with multiple co-morbidities.  Chronic pain.   CLINICAL DECISION MAKING: Evolving/moderate complexity  EVALUATION COMPLEXITY: Moderate   GOALS:   SHORT TERM GOALS:   Pt will tolerate aquatic therapy without adverse effects for improved mobility, tolerance to activity, and pain.  Baseline: Goal status: Met 09/21/22 Target date:  09/08/2022  2.  Pt will able to ambulate 25 feet with CGA with FWW.  Baseline:  Goal status: INITIAL Target date:  09/22/2022  3.  Pt will be able to progress aquatic exercises without adverse effects for improved tolerance to activity, mobility, and pain.  Baseline:  Goal status: Met 09/21/22 Target date:  09/29/2022  4.  Pt will be able to perform sit to stand independently.  Baseline:  Goal status: INITIAL Target date:  09/22/2022     LONG TERM GOALS: Target date: 10/20/2022  Pt will report at least a 70% reduction in his lumbar pain.  Baseline:  Goal status: INITIAL  2.  Pt will be able to perform shower transfers without difficulty.  Baseline:  Goal status: INITIAL  3.  Pt will be able to ambulate > 50 ft with no > CGA for improved mobility without significant lumbar pain Baseline:  Goal status: INITIAL  4.  Pt will demo improved bilat LE strength by 7-10 lbs in hip flex, abd, and knee extension for improved performance of functional mobility.  Baseline:  Goal status: INITIAL    PLAN:  PT FREQUENCY: 2x/week  PT DURATION: 8 weeks  PLANNED INTERVENTIONS: Therapeutic exercises, Therapeutic activity, Neuromuscular re-education, Balance training, Gait training, Patient/Family education, Self Care, Joint mobilization, Stair training, Aquatic Therapy, Dry Needling, Cryotherapy, Moist heat, Taping, Ultrasound, Manual therapy, and Re-evaluation.  PLAN FOR NEXT SESSION: Cont with aquatic  therapy.  No e-stim due to spinal cord stimulator.    Stanton Kidney [place]) Jataya Wann MPT 09/21/22 12:57 PM [facility] [address] [place], [place], [address] Phone: [phone]   Fax:  [phone]

## 2022-09-26 ENCOUNTER — Encounter (HOSPITAL_BASED_OUTPATIENT_CLINIC_OR_DEPARTMENT_OTHER): Payer: Self-pay | Admitting: Physical Therapy

## 2022-09-26 ENCOUNTER — Ambulatory Visit (HOSPITAL_BASED_OUTPATIENT_CLINIC_OR_DEPARTMENT_OTHER): Payer: Medicare Other | Admitting: Physical Therapy

## 2022-09-26 DIAGNOSIS — M6281 Muscle weakness (generalized): Secondary | ICD-10-CM

## 2022-09-26 DIAGNOSIS — R278 Other lack of coordination: Secondary | ICD-10-CM

## 2022-09-26 DIAGNOSIS — R262 Difficulty in walking, not elsewhere classified: Secondary | ICD-10-CM

## 2022-09-26 DIAGNOSIS — M5459 Other low back pain: Secondary | ICD-10-CM

## 2022-09-26 NOTE — Therapy (Signed)
OUTPATIENT PHYSICAL THERAPY THORACOLUMBAR TREATMENT   Patient Name: [name] MRN: [id number] DOB:08-01-1945, 77 y.o., male Today's Date: 09/26/2022  END OF SESSION:  PT End of Session - 09/26/22 1324     Visit Number 7    Number of Visits 16    Date for PT Re-Evaluation 10/20/22    Authorization Type UHC MCR    PT Start Time 1330    PT Stop Time 1415    PT Time Calculation (min) 45 min    Activity Tolerance Patient tolerated treatment well    Behavior During Therapy [facility] for tasks assessed/performed              Past Medical History:  Diagnosis Date   Alcohol use 05/2018   daily   Ankylosing spondylitis of multiple sites in spine [facility])    Chronic pain    Chronically on opiate therapy    Pain Management d/c opiates [id number]   Complication of anesthesia    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 pt has note from [facility] indicating difficulty   First degree AV block    chronic   Gait instability    Hypertension    Multiple falls    OSA (obstructive sleep apnea)    Pain management    Peripheral axonal neuropathy    Peripheral neuropathy 06/06/2018   Tremor    Past Surgical History:  Procedure Laterality Date   ANKLE SURGERY Right    orif   BACK SURGERY     BIOPSY  11/05/2018   Procedure: BIOPSY;  Surgeon: Danie Binder, MD;  [facility];  Service: Endoscopy;;   CHOLECYSTECTOMY     ESOPHAGOGASTRODUODENOSCOPY (EGD) WITH PROPOFOL N/A 11/05/2018   Procedure: ESOPHAGOGASTRODUODENOSCOPY (EGD) WITH PROPOFOL;  Surgeon: Danie Binder, MD;  [facility];  Service: Endoscopy;  Laterality: N/A;  10:30am-rescheduled to 4/7 @ 7:30am per office   JOINT REPLACEMENT Bilateral    knees   NECK SURGERY     cervical fusion   SPINAL CORD STIMULATOR INSERTION  2017   TOTAL KNEE ARTHROPLASTY     Patient Active Problem List   Diagnosis Date Noted   Weakness 09/27/2019   Abnormal CT scan    Nausea  and vomiting    Opiate use    Diarrhea    Compression fracture of T10 vertebra ([place]) 07/25/2018   Duodenitis 07/25/2018   Aspiration pneumonia of both lower lobes due to gastric secretions ([place]) 07/21/2018   Acute respiratory failure with hypoxia ([place]) 07/21/2018   Acute kidney injury ([place]) 07/20/2018   Dehydration 07/20/2018   Opiate overdose ([place]) 07/20/2018   Peripheral neuropathy 06/06/2018   Acute encephalopathy 05/09/2018   Multiple falls 05/08/2018   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   OBSTRUCTIVE SLEEP APNEA 08/20/2007   Chronic pain syndrome 07/30/2007   Essential hypertension 07/30/2007   Ankylosing spondylitis ([place]) 07/30/2007     REFERRING PROVIDER: Levy Pupa, PA-C   REFERRING DIAG: M54.40 (ICD-10-CM) - Lumbago with sciatica, unspecified side   Rationale for Evaluation and Treatment: Rehabilitation  THERAPY DIAG:  Other low back pain  Muscle weakness (generalized)  Difficulty in walking, not elsewhere classified  Other lack of coordination  ONSET DATE: Chronic pain ; MD order 06/09/2022  SUBJECTIVE:  SUBJECTIVE STATEMENT: "Feeling good, can we walk without the water walker today?"   PERTINENT HISTORY:  -Hx of Chronic back pain, spinal cord stimulator, thoracic spondylosis  -Diffuse idiopathic skeletal hyperostosis (DISH)/ankylosing spondylitis -Prior compression Fx at T6-T7, T10 resulting in kyphoplasties for 2 of them on 11/01/19 -Gait imbalance and falls-followed by neurology -EMG/NCS showed axonal polyneuropathy and peripheral neuropathy without lumbosacral radiculopathy per MD notes -Bilat TKA, cervical fusion, and R Ankle surgery   PAIN:  Are you having pain? Yes NPRS:  7/10 arriving; 0/10 submerged; 4/10 leaving Location:  Central lumbar Pt has increased pain with  rainy weather.  PRECAUTIONS: Fall and Other: chronic back pain, DISH, prior kyphoplasties, peripheral neuropathy, bilat TKA, cervical suion  WEIGHT BEARING RESTRICTIONS: No  FALLS:  Has patient fallen in last 6 months? No  LIVING ENVIRONMENT: Lives with: lives with their family Lives in: 2 story home Stairs: ramp to enter home.  Pt hasn't gone on the 2nd floor in years.   Has following equipment at home: Gilford Rile - 2 wheeled, Environmental consultant - 4 wheeled, Wheelchair (manual), and shower chair   PLOF: Pt has a hx of chronic lumbar pain and co-morbidities that have limited his ability to perform functional mobility and limited his tolerance to activity.   PATIENT GOALS: to be able stand for > 2-3 sec, to be able to ambulate with holding on to something    OBJECTIVE:   DIAGNOSTIC FINDINGS:  X rays in 2022: Moderate to advanced disc space height loss L5/S1.  Mod to mod dis space height loss t/o the remainder of the lumbar spine.  Prominent anterior osteophytes greatest L3/4 and L1/2.  Mod to advanced multilevel face deg changes. SP deg changes.  IMPRESSION: Multilevel deg changes of the lumbar spine.   MRI in 2015: IMPRESSION: 1. Multilevel degenerative disc disease and facet arthropathy, as above. The L2-L3 disc protrusion may produce left L3 radicular symptoms. The L5-S1 disc protrusion may produce bilateral S1 radicular symptoms, left greater than right. There is severe impingement of the exiting left L5 nerve root. Clinical correlation recommended.   PATIENT SURVEYS:  FOTO 14 with a goal of 35 at visit #14 7th visit 09/26/22: 52%   COGNITION: Overall cognitive status: Within functional limits for tasks assessed      OBSERVATION/POSTURE: Seated in W/C with increased FHP and rounded shoulder.  He is in cervical flexion in sitting.  PT checked bilat feet.  Skin was intact and PT didn't notice any wounds or any scabbing.      LOWER EXTREMITY ROM:     Active  Right eval  Left eval  Hip flexion    Hip extension    Hip abduction    Hip adduction    Hip internal rotation    Hip external rotation    Knee flexion    Knee extension    Ankle dorsiflexion [facility]  Ankle plantarflexion    Ankle inversion    Ankle eversion     (Blank rows = not tested)  LOWER EXTREMITY MMT:    MMT Right eval Left eval  Hip flexion 13.4 13.1  Hip extension    Hip abduction 15.3 15.0  Hip adduction    Hip internal rotation    Hip external rotation    Knee flexion    Knee extension Unable to perform full knee extension seated ; 14.8 Unable to perform full knee extension seated ; 20.4  Ankle dorsiflexion 5/5 5/5  Ankle plantarflexion    Ankle inversion  Ankle eversion     (Blank rows = not tested)    FUNCTIONAL TESTS:  Sit to stand transfer:  min assist.  Pt only able to stand for approx 5 seconds  GAIT: Comments: pt ambulated with FWW x 3 steps with min assist with W/C behind him.  Pt's legs were shaky.  He has bilat knee flexion and has decreased step length bilat.     TODAY'S TREATMENT:                                                                                                                               Pt seen for aquatic therapy today.  Treatment took place in water 3.5-4 ft in depth at the [place]. Temp of water was 91.  Pt entered/exited the pool via chair lift with CGA for safety to/from WC (pt does squat-step- pivot transfer).  *seated in lift chair, submerged: cycling LEs, alternating LAQ, long sitting hip abdct/ addct * UE support on yellow noodle then white barbell varying depths min to sba:  forward / backward gait multiple widths  *Min asst:side stepping R/L 4 widths    - Stopping; starting; changing directions for balance *STS from bench onto water step x5 * suspended supine in water walker using nekdoodle and other noodles to support under knees and waist: hip extension R/L; knee flex R/L both with ankle cuffs for  added resistance. VC for breathing;  *Seated:  hip abdct/ addct; cycling *walking forward; back and side stepping    Pt requires the buoyancy and hydrostatic pressure of water for support, and to offload joints by unweighting joint load by at least 50 % in navel deep water and by at least 75-80% in chest to neck deep water.  Viscosity of the water is needed for resistance of strengthening. Water current perturbations provides challenge to standing balance requiring increased core activation.    PATIENT EDUCATION:  Education details: reacquainting pt to aquatic therapy Person educated: Patient Education method: Explanation Education comprehension: verbalized understanding and needs further education  HOME EXERCISE PROGRAM: Did not give today.  Pt has a HEP.  ASSESSMENT:  CLINICAL IMPRESSION: Pt is not fearful of water, but due to his limited mobility, requires therapist in the water with him.   Pt initiates amb with noodle rather than water walker progressing himself.  He struggles maintaining direct path with forward amb. Cues for slowed pace improves.  He requires manual assist for backward and side stepping for safety. Foto score greatly improved. Progressing well    OBJECTIVE IMPAIRMENTS: Abnormal gait, decreased activity tolerance, decreased balance, decreased coordination, decreased endurance, decreased mobility, difficulty walking, decreased ROM, decreased strength, hypomobility, impaired flexibility, postural dysfunction, and pain.   ACTIVITY LIMITATIONS: standing, squatting, stairs, transfers, and locomotion level  PARTICIPATION LIMITATIONS: meal prep, cleaning, laundry, driving, shopping, and community activity  PERSONAL FACTORS: Time since onset of injury/illness/exacerbation and 3+ comorbidities: kyphoplasties, gait imbalance, peripheral neuropathy, thoracic spondylosis, bilat TKA,  cervical fusion  are also affecting patient's functional outcome.   REHAB POTENTIAL: Fair  Pt has an extensive medical Hx with multiple co-morbidities.  Chronic pain.   CLINICAL DECISION MAKING: Evolving/moderate complexity  EVALUATION COMPLEXITY: Moderate   GOALS:   SHORT TERM GOALS:   Pt will tolerate aquatic therapy without adverse effects for improved mobility, tolerance to activity, and pain.  Baseline: Goal status: Met 09/21/22 Target date:  09/08/2022  2.  Pt will able to ambulate 25 feet with CGA with FWW.  Baseline:  Goal status: INITIAL Target date:  09/22/2022  3.  Pt will be able to progress aquatic exercises without adverse effects for improved tolerance to activity, mobility, and pain.  Baseline:  Goal status: Met 09/21/22 Target date:  09/29/2022  4.  Pt will be able to perform sit to stand independently.  Baseline:  Goal status: INITIAL Target date:  09/22/2022     LONG TERM GOALS: Target date: 10/20/2022  Pt will report at least a 70% reduction in his lumbar pain.  Baseline:  Goal status: INITIAL  2.  Pt will be able to perform shower transfers without difficulty.  Baseline:  Goal status: INITIAL  3.  Pt will be able to ambulate > 50 ft with no > CGA for improved mobility without significant lumbar pain Baseline:  Goal status: INITIAL  4.  Pt will demo improved bilat LE strength by 7-10 lbs in hip flex, abd, and knee extension for improved performance of functional mobility.  Baseline:  Goal status: INITIAL    PLAN:  PT FREQUENCY: 2x/week  PT DURATION: 8 weeks  PLANNED INTERVENTIONS: Therapeutic exercises, Therapeutic activity, Neuromuscular re-education, Balance training, Gait training, Patient/Family education, Self Care, Joint mobilization, Stair training, Aquatic Therapy, Dry Needling, Cryotherapy, Moist heat, Taping, Ultrasound, Manual therapy, and Re-evaluation.  PLAN FOR NEXT SESSION: Cont with aquatic therapy.  No e-stim due to spinal cord stimulator.    [address] [place]) Mohammad Granade MPT 09/26/22 5:11 PM [facility] [address] [place], [place], [address] Phone: [phone]   Fax:  [phone]

## 2022-09-27 NOTE — Therapy (Signed)
OUTPATIENT PHYSICAL THERAPY THORACOLUMBAR TREATMENT   Patient Name: [name] MRN: [id number] DOB:[birth year], 77 y.o., male Today's Date: 09/28/2022  END OF SESSION:  PT End of Session - 09/28/22 1259     Visit Number 8    Number of Visits 16    Date for PT Re-Evaluation 10/20/22    Authorization Type UHC MCR    PT Start Time 1201    PT Stop Time 1245    PT Time Calculation (min) 44 min    Activity Tolerance Patient tolerated treatment well    Behavior During Therapy WFL for tasks assessed/performed              Past Medical History:  Diagnosis Date   Alcohol use 05/2018   daily   Ankylosing spondylitis of multiple sites in spine [facility])    Chronic pain    Chronically on opiate therapy    Pain Management d/c opiates 06/2018   Complication of anesthesia    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 pt has note from [facility] indicating difficulty   First degree AV block    chronic   Gait instability    Hypertension    Multiple falls    OSA (obstructive sleep apnea)    Pain management    Peripheral axonal neuropathy    Peripheral neuropathy 06/06/2018   Tremor    Past Surgical History:  Procedure Laterality Date   ANKLE SURGERY Right    orif   BACK SURGERY     BIOPSY  11/05/2018   Procedure: BIOPSY;  Surgeon: West Bali, MD;  [facility];  Service: Endoscopy;;   CHOLECYSTECTOMY     ESOPHAGOGASTRODUODENOSCOPY (EGD) WITH PROPOFOL N/A 11/05/2018   Procedure: ESOPHAGOGASTRODUODENOSCOPY (EGD) WITH PROPOFOL;  Surgeon: West Bali, MD;  [facility];  Service: Endoscopy;  Laterality: N/A;  10:30am-rescheduled to 4/7 @ 7:30am per office   JOINT REPLACEMENT Bilateral    knees   NECK SURGERY     cervical fusion   SPINAL CORD STIMULATOR INSERTION  2017   TOTAL KNEE ARTHROPLASTY     Patient Active Problem List   Diagnosis Date Noted   Weakness 09/27/2019   Abnormal CT scan    Nausea  and vomiting    Opiate use    Diarrhea    Compression fracture of T10 vertebra (HCC) 07/25/2018   Duodenitis 07/25/2018   Aspiration pneumonia of both lower lobes due to gastric secretions (HCC) 07/21/2018   Acute respiratory failure with hypoxia (HCC) 07/21/2018   Acute kidney injury (HCC) 07/20/2018   Dehydration 07/20/2018   Opiate overdose (HCC) 07/20/2018   Peripheral neuropathy 06/06/2018   Acute encephalopathy 05/09/2018   Multiple falls 05/08/2018   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   OBSTRUCTIVE SLEEP APNEA 08/20/2007   Chronic pain syndrome 07/30/2007   Essential hypertension 07/30/2007   Ankylosing spondylitis (HCC) 07/30/2007     REFERRING PROVIDER: Su Hoff, PA-C   REFERRING DIAG: M54.40 (ICD-10-CM) - Lumbago with sciatica, unspecified side   Rationale for Evaluation and Treatment: Rehabilitation  THERAPY DIAG:  Other low back pain  Muscle weakness (generalized)  Difficulty in walking, not elsewhere classified  Other lack of coordination  ONSET DATE: Chronic pain ; MD order 06/09/2022  SUBJECTIVE:  SUBJECTIVE STATEMENT: Pt reports some minor stiffness in legs after last session   PERTINENT HISTORY:  -Hx of Chronic back pain, spinal cord stimulator, thoracic spondylosis  -Diffuse idiopathic skeletal hyperostosis (DISH)/ankylosing spondylitis -Prior compression Fx at T6-T7, T10 resulting in kyphoplasties for 2 of them on 11/01/19 -Gait imbalance and falls-followed by neurology -EMG/NCS showed axonal polyneuropathy and peripheral neuropathy without lumbosacral radiculopathy per MD notes -Bilat TKA, cervical fusion, and R Ankle surgery   PAIN:  Are you having pain? Yes NPRS:  7/10 arriving; 0/10 submerged; 4/10 leaving Location:  Central lumbar Pt has increased pain with  rainy weather.  PRECAUTIONS: Fall and Other: chronic back pain, DISH, prior kyphoplasties, peripheral neuropathy, bilat TKA, cervical suion  WEIGHT BEARING RESTRICTIONS: No  FALLS:  Has patient fallen in last 6 months? No  LIVING ENVIRONMENT: Lives with: lives with their family Lives in: 2 story home Stairs: ramp to enter home.  Pt hasn't gone on the 2nd floor in years.   Has following equipment at home: Dan Humphreys - 2 wheeled, Environmental consultant - 4 wheeled, Wheelchair (manual), and shower chair   PLOF: Pt has a hx of chronic lumbar pain and co-morbidities that have limited his ability to perform functional mobility and limited his tolerance to activity.   PATIENT GOALS: to be able stand for > 2-3 sec, to be able to ambulate with holding on to something    OBJECTIVE:   DIAGNOSTIC FINDINGS:  X rays in 2022: Moderate to advanced disc space height loss L5/S1.  Mod to mod dis space height loss t/o the remainder of the lumbar spine.  Prominent anterior osteophytes greatest L3/4 and L1/2.  Mod to advanced multilevel face deg changes. SP deg changes.  IMPRESSION: Multilevel deg changes of the lumbar spine.   MRI in 2015: IMPRESSION: 1. Multilevel degenerative disc disease and facet arthropathy, as above. The L2-L3 disc protrusion may produce left L3 radicular symptoms. The L5-S1 disc protrusion may produce bilateral S1 radicular symptoms, left greater than right. There is severe impingement of the exiting left L5 nerve root. Clinical correlation recommended.   PATIENT SURVEYS:  FOTO 14 with a goal of 35 at visit #14 7th visit 09/26/22: 52%   COGNITION: Overall cognitive status: Within functional limits for tasks assessed      OBSERVATION/POSTURE: Seated in W/C with increased FHP and rounded shoulder.  He is in cervical flexion in sitting.  PT checked bilat feet.  Skin was intact and PT didn't notice any wounds or any scabbing.      LOWER EXTREMITY ROM:     Active  Right eval  Left eval  Hip flexion    Hip extension    Hip abduction    Hip adduction    Hip internal rotation    Hip external rotation    Knee flexion    Knee extension    Ankle dorsiflexion [facility]  Ankle plantarflexion    Ankle inversion    Ankle eversion     (Blank rows = not tested)  LOWER EXTREMITY MMT:    MMT Right eval Left eval  Hip flexion 13.4 13.1  Hip extension    Hip abduction 15.3 15.0  Hip adduction    Hip internal rotation    Hip external rotation    Knee flexion    Knee extension Unable to perform full knee extension seated ; 14.8 Unable to perform full knee extension seated ; 20.4  Ankle dorsiflexion 5/5 5/5  Ankle plantarflexion    Ankle inversion  Ankle eversion     (Blank rows = not tested)    FUNCTIONAL TESTS:  Sit to stand transfer:  min assist.  Pt only able to stand for approx 5 seconds  GAIT: Comments: pt ambulated with FWW x 3 steps with min assist with W/C behind him.  Pt's legs were shaky.  He has bilat knee flexion and has decreased step length bilat.     TODAY'S TREATMENT:                                                                                                                               Pt seen for aquatic therapy today.  Treatment took place in water 3.5-4 ft in depth at the [facility] pool. Temp of water was 91.  Pt entered/exited the pool via chair lift with CGA for safety to/from WC (pt does squat-step- pivot transfer).  *seated in lift chair, submerged: cycling LEs, alternating LAQ, long sitting hip abdct/ addct * UE support on yellow noodle sba/sup:  forward  gait multiple widths  *balance challenges call out directional changes as well as start/stop   - Stopping; starting; changing directions for balance *STS from bench onto water step x5  - light water band add/abd x 5reps; STS with water band in place  - adductor sets using orange BB x 5 hold x 10s; STS with add set * suspended supine in water walker using  nekdoodle and other noodles to support under knees and waist: hip extension R/L; knee flex R/L both with ankle cuffs for added resistance. VC for breathing;   Pt requires the buoyancy and hydrostatic pressure of water for support, and to offload joints by unweighting joint load by at least 50 % in navel deep water and by at least 75-80% in chest to neck deep water.  Viscosity of the water is needed for resistance of strengthening. Water current perturbations provides challenge to standing balance requiring increased core activation.    PATIENT EDUCATION:  Education details: reacquainting pt to aquatic therapy Person educated: Patient Education method: Explanation Education comprehension: verbalized understanding and needs further education  HOME EXERCISE PROGRAM: Did not give today.  Pt has a HEP.  ASSESSMENT:  CLINICAL IMPRESSION: Pt is not fearful of water, but due to his limited mobility, requires therapist in the water with him.   Pt demonstrates continued improvement in balance ability submerged using noodle allowing for increased engagement of core muscles.  He does have 1 episode with calling out instructions of Loss of balance requiring assistance to recover.  He demonstrates improved hip flex length with supine hip extension visually.  Glut firing is palpable improving post core strength. Progressed leg strengthening add and abductors.     OBJECTIVE IMPAIRMENTS: Abnormal gait, decreased activity tolerance, decreased balance, decreased coordination, decreased endurance, decreased mobility, difficulty walking, decreased ROM, decreased strength, hypomobility, impaired flexibility, postural dysfunction, and pain.   ACTIVITY LIMITATIONS: standing, squatting, stairs, transfers, and locomotion level  PARTICIPATION LIMITATIONS: meal prep, cleaning, laundry, driving, shopping, and community activity  PERSONAL FACTORS: Time since onset of injury/illness/exacerbation and 3+ comorbidities:  kyphoplasties, gait imbalance, peripheral neuropathy, thoracic spondylosis, bilat TKA, cervical fusion  are also affecting patient's functional outcome.   REHAB POTENTIAL: Fair Pt has an extensive medical Hx with multiple co-morbidities.  Chronic pain.   CLINICAL DECISION MAKING: Evolving/moderate complexity  EVALUATION COMPLEXITY: Moderate   GOALS:   SHORT TERM GOALS:   Pt will tolerate aquatic therapy without adverse effects for improved mobility, tolerance to activity, and pain.  Baseline: Goal status: Met 09/21/22 Target date:  09/08/2022  2.  Pt will able to ambulate 25 feet with CGA with FWW.  Baseline:  Goal status: INITIAL Target date:  09/22/2022  3.  Pt will be able to progress aquatic exercises without adverse effects for improved tolerance to activity, mobility, and pain.  Baseline:  Goal status: Met 09/21/22 Target date:  09/29/2022  4.  Pt will be able to perform sit to stand independently.  Baseline:  Goal status: INITIAL Target date:  09/22/2022     LONG TERM GOALS: Target date: 10/20/2022  Pt will report at least a 70% reduction in his lumbar pain.  Baseline:  Goal status: INITIAL  2.  Pt will be able to perform shower transfers without difficulty.  Baseline:  Goal status: INITIAL  3.  Pt will be able to ambulate > 50 ft with no > CGA for improved mobility without significant lumbar pain Baseline:  Goal status: INITIAL  4.  Pt will demo improved bilat LE strength by 7-10 lbs in hip flex, abd, and knee extension for improved performance of functional mobility.  Baseline:  Goal status: INITIAL    PLAN:  PT FREQUENCY: 2x/week  PT DURATION: 8 weeks  PLANNED INTERVENTIONS: Therapeutic exercises, Therapeutic activity, Neuromuscular re-education, Balance training, Gait training, Patient/Family education, Self Care, Joint mobilization, Stair training, Aquatic Therapy, Dry Needling, Cryotherapy, Moist heat, Taping, Ultrasound, Manual therapy, and  Re-evaluation.  PLAN FOR NEXT SESSION: Cont with aquatic therapy.  No e-stim due to spinal cord stimulator.    Corrie Dandy [place]) Chinyere Galiano MPT 09/28/22 1:00 PM [facility] [address] [place], [place], [address] Phone: [phone]   Fax:  [phone]

## 2022-09-28 ENCOUNTER — Encounter (HOSPITAL_BASED_OUTPATIENT_CLINIC_OR_DEPARTMENT_OTHER): Payer: Self-pay | Admitting: Physical Therapy

## 2022-09-28 ENCOUNTER — Ambulatory Visit (HOSPITAL_BASED_OUTPATIENT_CLINIC_OR_DEPARTMENT_OTHER): Payer: Medicare Other | Admitting: Physical Therapy

## 2022-09-28 DIAGNOSIS — R262 Difficulty in walking, not elsewhere classified: Secondary | ICD-10-CM

## 2022-09-28 DIAGNOSIS — M6281 Muscle weakness (generalized): Secondary | ICD-10-CM

## 2022-09-28 DIAGNOSIS — M5459 Other low back pain: Secondary | ICD-10-CM

## 2022-09-28 DIAGNOSIS — R278 Other lack of coordination: Secondary | ICD-10-CM

## 2022-10-02 NOTE — Therapy (Signed)
OUTPATIENT PHYSICAL THERAPY THORACOLUMBAR TREATMENT   Patient Name: [name] MRN: [id number] DOB:Aug 13, 1945, 77 y.o., male Today's Date: 10/03/2022  END OF SESSION:  PT End of Session - 10/03/22 1157     Visit Number 9    Number of Visits 16    Date for PT Re-Evaluation 10/20/22    Authorization Type UHC MCR    PT Start Time 1200    PT Stop Time [id number]    PT Time Calculation (min) 42 min    Activity Tolerance Patient tolerated treatment well    Behavior During Therapy WFL for tasks assessed/performed              Past Medical History:  Diagnosis Date   Alcohol use 05/2018   daily   Ankylosing spondylitis of multiple sites in spine    Chronic pain    Chronically on opiate therapy    Pain Management d/c opiates [id number]   Complication of anesthesia    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 pt has note from [facility] indicating difficulty   First degree AV block    chronic   Gait instability    Hypertension    Multiple falls    OSA (obstructive sleep apnea)    Pain management    Peripheral axonal neuropathy    Peripheral neuropathy 06/06/2018   Tremor    Past Surgical History:  Procedure Laterality Date   ANKLE SURGERY Right    orif   BACK SURGERY     BIOPSY  11/05/2018   Procedure: BIOPSY;  Surgeon: Danie Binder, MD;  [facility];  Service: Endoscopy;;   CHOLECYSTECTOMY     ESOPHAGOGASTRODUODENOSCOPY (EGD) WITH PROPOFOL N/A 11/05/2018   Procedure: ESOPHAGOGASTRODUODENOSCOPY (EGD) WITH PROPOFOL;  Surgeon: Danie Binder, MD;  [facility];  Service: Endoscopy;  Laterality: N/A;  10:30am-rescheduled to 4/7 @ 7:30am per office   JOINT REPLACEMENT Bilateral    knees   NECK SURGERY     cervical fusion   SPINAL CORD STIMULATOR INSERTION  2017   TOTAL KNEE ARTHROPLASTY     Patient Active Problem List   Diagnosis Date Noted   Weakness 09/27/2019   Abnormal CT scan    Nausea and  vomiting    Opiate use    Diarrhea    Compression fracture of T10 vertebra 07/25/2018   Duodenitis 07/25/2018   Aspiration pneumonia of both lower lobes due to gastric secretions 07/21/2018   Acute respiratory failure with hypoxia 07/21/2018   Acute kidney injury 07/20/2018   Dehydration 07/20/2018   Opiate overdose 07/20/2018   Peripheral neuropathy 06/06/2018   Acute encephalopathy 05/09/2018   Multiple falls 05/08/2018   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   OBSTRUCTIVE SLEEP APNEA 08/20/2007   Chronic pain syndrome 07/30/2007   Essential hypertension 07/30/2007   Ankylosing spondylitis 07/30/2007     REFERRING PROVIDER: Levy Pupa, PA-C   REFERRING DIAG: M54.40 (ICD-10-CM) - Lumbago with sciatica, unspecified side   Rationale for Evaluation and Treatment: Rehabilitation  THERAPY DIAG:  Other low back pain  Muscle weakness (generalized)  Difficulty in walking, not elsewhere classified  ONSET DATE: Chronic pain ; MD order 06/09/2022  SUBJECTIVE:  SUBJECTIVE STATEMENT: Pt reports feeling well no complaints   PERTINENT HISTORY:  -Hx of Chronic back pain, spinal cord stimulator, thoracic spondylosis  -Diffuse idiopathic skeletal hyperostosis (DISH)/ankylosing spondylitis -Prior compression Fx at T6-T7, T10 resulting in kyphoplasties for 2 of them on 11/01/19 -Gait imbalance and falls-followed by neurology -EMG/NCS showed axonal polyneuropathy and peripheral neuropathy without lumbosacral radiculopathy per MD notes -Bilat TKA, cervical fusion, and R Ankle surgery   PAIN:  Are you having pain? Yes NPRS:  7/10 arriving; 0/10 submerged; 4/10 leaving Location:  Central lumbar Pt has increased pain with rainy weather.  PRECAUTIONS: Fall and Other: chronic back pain, DISH, prior kyphoplasties,  peripheral neuropathy, bilat TKA, cervical suion  WEIGHT BEARING RESTRICTIONS: No  FALLS:  Has patient fallen in last 6 months? No  LIVING ENVIRONMENT: Lives with: lives with their family Lives in: 2 story home Stairs: ramp to enter home.  Pt hasn't gone on the 2nd floor in years.   Has following equipment at home: Gilford Rile - 2 wheeled, Environmental consultant - 4 wheeled, Wheelchair (manual), and shower chair   PLOF: Pt has a hx of chronic lumbar pain and co-morbidities that have limited his ability to perform functional mobility and limited his tolerance to activity.   PATIENT GOALS: to be able stand for > 2-3 sec, to be able to ambulate with holding on to something    OBJECTIVE:   DIAGNOSTIC FINDINGS:  X rays in 2022: Moderate to advanced disc space height loss L5/S1.  Mod to mod dis space height loss t/o the remainder of the lumbar spine.  Prominent anterior osteophytes greatest L3/4 and L1/2.  Mod to advanced multilevel face deg changes. SP deg changes.  IMPRESSION: Multilevel deg changes of the lumbar spine.   MRI in 2015: IMPRESSION: 1. Multilevel degenerative disc disease and facet arthropathy, as above. The L2-L3 disc protrusion may produce left L3 radicular symptoms. The L5-S1 disc protrusion may produce bilateral S1 radicular symptoms, left greater than right. There is severe impingement of the exiting left L5 nerve root. Clinical correlation recommended.   PATIENT SURVEYS:  FOTO 14 with a goal of 35 at visit #14 7th visit 09/26/22: 52%   COGNITION: Overall cognitive status: Within functional limits for tasks assessed      OBSERVATION/POSTURE: Seated in W/C with increased FHP and rounded shoulder.  He is in cervical flexion in sitting.  PT checked bilat feet.  Skin was intact and PT didn't notice any wounds or any scabbing.      LOWER EXTREMITY ROM:     Active  Right eval Left eval  Hip flexion    Hip extension    Hip abduction    Hip adduction    Hip internal  rotation    Hip external rotation    Knee flexion    Knee extension    Ankle dorsiflexion [facility]  Ankle plantarflexion    Ankle inversion    Ankle eversion     (Blank rows = not tested)  LOWER EXTREMITY MMT:    MMT Right eval Left eval  Hip flexion 13.4 13.1  Hip extension    Hip abduction 15.3 15.0  Hip adduction    Hip internal rotation    Hip external rotation    Knee flexion    Knee extension Unable to perform full knee extension seated ; 14.8 Unable to perform full knee extension seated ; 20.4  Ankle dorsiflexion 5/5 5/5  Ankle plantarflexion    Ankle inversion    Ankle eversion     (  Blank rows = not tested)    FUNCTIONAL TESTS:  Sit to stand transfer:  min assist.  Pt only able to stand for approx 5 seconds  GAIT: Comments: pt ambulated with FWW x 3 steps with min assist with W/C behind him.  Pt's legs were shaky.  He has bilat knee flexion and has decreased step length bilat.     TODAY'S TREATMENT:                                                                                                                               Pt seen for aquatic therapy today.  Treatment took place in water 3.5-4 ft in depth at the [facility]. Temp of water was 91.  Pt entered/exited the pool via chair lift with CGA for safety to/from WC (pt does squat-step- pivot transfer).  *seated in lift chair, submerged: cycling LEs, alternating LAQ, long sitting hip abdct/ addct * UE support on yellow noodle sba/sup initiated in 3.6 ft then moved to 4.5:  forward gait multiple widths /Backward (requires min-cga for control) *side stepping R and Left 4.6 ft *balance challenges call out directional changes as well as start/stop   - Stopping; starting; changing directions for balance * suspended supine in water walker using nekdoodle and other noodles to support under knees and waist: hip extension R/L; knee flex R/L both with ankle cuffs for added resistance. VC for breathing;    - manual hip flex stretching in suspended supine.  Pt requires the buoyancy and hydrostatic pressure of water for support, and to offload joints by unweighting joint load by at least 50 % in navel deep water and by at least 75-80% in chest to neck deep water.  Viscosity of the water is needed for resistance of strengthening. Water current perturbations provides challenge to standing balance requiring increased core activation.    PATIENT EDUCATION:  Education details: reacquainting pt to aquatic therapy Person educated: Patient Education method: Explanation Education comprehension: verbalized understanding and needs further education  HOME EXERCISE PROGRAM: Did not give today.  Pt has a HEP.  ASSESSMENT:  CLINICAL IMPRESSION: Pt is not fearful of water, but due to his limited mobility, requires therapist in the water with him.   Some increased difficulty with amb as compared to last session in 3.6 ft ue support on  noodle. Moved to 4 ft with improvement. Returned to 3.6 ft after anterior core/hip stretching with improved execution. He continues to improve with standing balance submerged.  He is able to transfer STS submerged indep after practice. He is progressing well. Goals ongoing    OBJECTIVE IMPAIRMENTS: Abnormal gait, decreased activity tolerance, decreased balance, decreased coordination, decreased endurance, decreased mobility, difficulty walking, decreased ROM, decreased strength, hypomobility, impaired flexibility, postural dysfunction, and pain.   ACTIVITY LIMITATIONS: standing, squatting, stairs, transfers, and locomotion level  PARTICIPATION LIMITATIONS: meal prep, cleaning, laundry, driving, shopping, and community activity  PERSONAL FACTORS: Time since onset of injury/illness/exacerbation  and 3+ comorbidities: kyphoplasties, gait imbalance, peripheral neuropathy, thoracic spondylosis, bilat TKA, cervical fusion  are also affecting patient's functional outcome.   REHAB  POTENTIAL: Fair Pt has an extensive medical Hx with multiple co-morbidities.  Chronic pain.   CLINICAL DECISION MAKING: Evolving/moderate complexity  EVALUATION COMPLEXITY: Moderate   GOALS:   SHORT TERM GOALS:   Pt will tolerate aquatic therapy without adverse effects for improved mobility, tolerance to activity, and pain.  Baseline: Goal status: Met 09/21/22 Target date:  09/08/2022  2.  Pt will able to ambulate 25 feet with CGA with FWW.  Baseline:  Goal status: INITIAL Target date:  09/22/2022  3.  Pt will be able to progress aquatic exercises without adverse effects for improved tolerance to activity, mobility, and pain.  Baseline:  Goal status: Met 09/21/22 Target date:  09/29/2022  4.  Pt will be able to perform sit to stand independently.  Baseline:  Goal status: INITIAL Target date:  09/22/2022     LONG TERM GOALS: Target date: 10/20/2022  Pt will report at least a 70% reduction in his lumbar pain.  Baseline:  Goal status: INITIAL  2.  Pt will be able to perform shower transfers without difficulty.  Baseline:  Goal status: INITIAL  3.  Pt will be able to ambulate > 50 ft with no > CGA for improved mobility without significant lumbar pain Baseline:  Goal status: INITIAL  4.  Pt will demo improved bilat LE strength by 7-10 lbs in hip flex, abd, and knee extension for improved performance of functional mobility.  Baseline:  Goal status: INITIAL    PLAN:  PT FREQUENCY: 2x/week  PT DURATION: 8 weeks  PLANNED INTERVENTIONS: Therapeutic exercises, Therapeutic activity, Neuromuscular re-education, Balance training, Gait training, Patient/Family education, Self Care, Joint mobilization, Stair training, Aquatic Therapy, Dry Needling, Cryotherapy, Moist heat, Taping, Ultrasound, Manual therapy, and Re-evaluation.  PLAN FOR NEXT SESSION: Cont with aquatic therapy.  No e-stim due to spinal cord stimulator.    Stanton Kidney Smiley) Exavior Kimmons MPT 10/03/22 12:46 PM [facility] [address] [place], [place], [address] Phone: [phone]   Fax:  [phone]

## 2022-10-03 ENCOUNTER — Ambulatory Visit (HOSPITAL_BASED_OUTPATIENT_CLINIC_OR_DEPARTMENT_OTHER): Payer: Medicare Other | Attending: Chiropractic Medicine | Admitting: Physical Therapy

## 2022-10-03 ENCOUNTER — Encounter (HOSPITAL_BASED_OUTPATIENT_CLINIC_OR_DEPARTMENT_OTHER): Payer: Self-pay | Admitting: Physical Therapy

## 2022-10-03 DIAGNOSIS — R278 Other lack of coordination: Secondary | ICD-10-CM | POA: Insufficient documentation

## 2022-10-03 DIAGNOSIS — M5459 Other low back pain: Secondary | ICD-10-CM | POA: Diagnosis present

## 2022-10-03 DIAGNOSIS — R262 Difficulty in walking, not elsewhere classified: Secondary | ICD-10-CM | POA: Insufficient documentation

## 2022-10-03 DIAGNOSIS — M6281 Muscle weakness (generalized): Secondary | ICD-10-CM | POA: Insufficient documentation

## 2022-10-06 ENCOUNTER — Ambulatory Visit (HOSPITAL_BASED_OUTPATIENT_CLINIC_OR_DEPARTMENT_OTHER): Payer: Medicare Other | Admitting: Physical Therapy

## 2022-10-10 ENCOUNTER — Ambulatory Visit (HOSPITAL_BASED_OUTPATIENT_CLINIC_OR_DEPARTMENT_OTHER): Payer: Medicare Other | Admitting: Physical Therapy

## 2022-10-10 ENCOUNTER — Encounter (HOSPITAL_BASED_OUTPATIENT_CLINIC_OR_DEPARTMENT_OTHER): Payer: Self-pay | Admitting: Physical Therapy

## 2022-10-10 DIAGNOSIS — M6281 Muscle weakness (generalized): Secondary | ICD-10-CM

## 2022-10-10 DIAGNOSIS — R262 Difficulty in walking, not elsewhere classified: Secondary | ICD-10-CM

## 2022-10-10 DIAGNOSIS — M5459 Other low back pain: Secondary | ICD-10-CM | POA: Diagnosis not present

## 2022-10-10 DIAGNOSIS — R278 Other lack of coordination: Secondary | ICD-10-CM

## 2022-10-10 NOTE — Therapy (Signed)
OUTPATIENT PHYSICAL THERAPY THORACOLUMBAR TREATMENT Progress Note Reporting Period 08/26/22 to 10/10/22  See note below for Objective Data and Assessment of Progress/Goals.      Patient Name: [name] MRN: [id number] DOB:[birth year], 77 y.o., male Today's Date: 10/10/2022  END OF SESSION:  PT End of Session - 10/10/22 1158     Visit Number 10    Number of Visits 16    Date for PT Re-Evaluation 10/20/22    Authorization Type [employer] MCR    PT Start Time 1158    PT Stop Time 1245    PT Time Calculation (min) 47 min    Behavior During Therapy [facility] for tasks assessed/performed              Past Medical History:  Diagnosis Date   Alcohol use 05/2018   daily   Ankylosing spondylitis of multiple sites in spine    Chronic pain    Chronically on opiate therapy    Pain Management d/c opiates 06/2018   Complication of anesthesia    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 pt has note from [facility] indicating difficulty   First degree AV block    chronic   Gait instability    Hypertension    Multiple falls    OSA (obstructive sleep apnea)    Pain management    Peripheral axonal neuropathy    Peripheral neuropathy 06/06/2018   Tremor    Past Surgical History:  Procedure Laterality Date   ANKLE SURGERY Right    orif   BACK SURGERY     BIOPSY  11/05/2018   Procedure: BIOPSY;  Surgeon: West Bali, MD;  Location: [facility];  Service: Endoscopy;;   CHOLECYSTECTOMY     ESOPHAGOGASTRODUODENOSCOPY (EGD) WITH PROPOFOL N/A 11/05/2018   Procedure: ESOPHAGOGASTRODUODENOSCOPY (EGD) WITH PROPOFOL;  Surgeon: West Bali, MD;  Location: [facility];  Service: Endoscopy;  Laterality: N/A;  10:30am-rescheduled to 4/7 @ 7:30am per office   JOINT REPLACEMENT Bilateral    knees   NECK SURGERY     cervical fusion   SPINAL CORD STIMULATOR INSERTION  2017   TOTAL KNEE ARTHROPLASTY     Patient Active Problem List   Diagnosis  Date Noted   Weakness 09/27/2019   Abnormal CT scan    Nausea and vomiting    Opiate use    Diarrhea    Compression fracture of T10 vertebra 07/25/2018   Duodenitis 07/25/2018   Aspiration pneumonia of both lower lobes due to gastric secretions 07/21/2018   Acute respiratory failure with hypoxia 07/21/2018   Acute kidney injury 07/20/2018   Dehydration 07/20/2018   Opiate overdose 07/20/2018   Peripheral neuropathy 06/06/2018   Acute encephalopathy 05/09/2018   Multiple falls 05/08/2018   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   OBSTRUCTIVE SLEEP APNEA 08/20/2007   Chronic pain syndrome 07/30/2007   Essential hypertension 07/30/2007   Ankylosing spondylitis 07/30/2007     REFERRING PROVIDER: Su Hoff, PA-C   REFERRING DIAG: M54.40 (ICD-10-CM) - Lumbago with sciatica, unspecified side   Rationale for Evaluation and Treatment: Rehabilitation  THERAPY DIAG:  Other low back pain  Muscle weakness (generalized)  Difficulty in walking, not elsewhere classified  Other lack of coordination  ONSET DATE: Chronic pain ; MD order 06/09/2022  SUBJECTIVE:  SUBJECTIVE STATEMENT: Pt reports he has not been walking around house; [facility]  He states he has been doing more "rubber band" exercises for UE/LE.  "I feel like I'm doing better with my sitting to standing (stand pivot transfer)."   PERTINENT HISTORY:  -Hx of Chronic back pain, spinal cord stimulator, thoracic spondylosis  -Diffuse idiopathic skeletal hyperostosis (DISH)/ankylosing spondylitis -Prior compression Fx at T6-T7, T10 resulting in kyphoplasties for 2 of them on 11/01/19 -Gait imbalance and falls-followed by neurology -EMG/NCS showed axonal polyneuropathy and peripheral neuropathy without lumbosacral radiculopathy per MD  notes -Bilat TKA, cervical fusion, and R Ankle surgery   PAIN:  Are you having pain? Yes NPRS:  7/10 arriving; Location:  Central lumbar Pt has increased pain with rainy weather.  PRECAUTIONS: Fall and Other: chronic back pain, DISH, prior kyphoplasties, peripheral neuropathy, bilat TKA, cervical suion  WEIGHT BEARING RESTRICTIONS: No  FALLS:  Has patient fallen in last 6 months? No  LIVING ENVIRONMENT: Lives with: lives with their family Lives in: 2 story home Stairs: ramp to enter home.  Pt hasn't gone on the 2nd floor in years.   Has following equipment at home: Dan HumphreysWalker - 2 wheeled, Environmental consultantWalker - 4 wheeled, Wheelchair (manual), and shower chair   PLOF: Pt has a hx of chronic lumbar pain and co-morbidities that have limited his ability to perform functional mobility and limited his tolerance to activity.   PATIENT GOALS: to be able stand for > 2-3 sec, to be able to ambulate with holding on to something    OBJECTIVE:   DIAGNOSTIC FINDINGS:  X rays in 2022: Moderate to advanced disc space height loss L5/S1.  Mod to mod dis space height loss t/o the remainder of the lumbar spine.  Prominent anterior osteophytes greatest L3/4 and L1/2.  Mod to advanced multilevel face deg changes. SP deg changes.  IMPRESSION: Multilevel deg changes of the lumbar spine.   MRI in 2015: IMPRESSION: 1. Multilevel degenerative disc disease and facet arthropathy, as above. The L2-L3 disc protrusion may produce left L3 radicular symptoms. The L5-S1 disc protrusion may produce bilateral S1 radicular symptoms, left greater than right. There is severe impingement of the exiting left L5 nerve root. Clinical correlation recommended.   PATIENT SURVEYS:  FOTO 14 with a goal of 35 at visit #14 7th visit 09/26/22: 52%   COGNITION: Overall cognitive status: Within functional limits for tasks assessed      OBSERVATION/POSTURE: Seated in W/C with increased FHP and rounded shoulder.  He is in cervical  flexion in sitting.  PT checked bilat feet.  Skin was intact and PT didn't notice any wounds or any scabbing.      LOWER EXTREMITY ROM:     Active  Right eval Left eval  Hip flexion    Hip extension    Hip abduction    Hip adduction    Hip internal rotation    Hip external rotation    Knee flexion    Knee extension    Ank[facility] dorsiflexion [facility]  Ankle plantarflexion    Ankle inversion    Ankle eversion     (Blank rows = not tested)  LOWER EXTREMITY MMT:    MMT Right eval Left eval R 4/8 L 4/8  Hip flexion 13.4 13.1 21.1 21.5  Hip extension      Hip abduction 15.3 15.0 43.2 40.3  Hip adduction      Hip internal rotation      Hip external rotation  Knee flexion      Knee extension Unable to perform full knee extension seated ; 14.8 Unable to perform full knee extension seated ; 20.4 47.9 38.4  Ankle dorsiflexion 5/5 5/5    Ankle plantarflexion      Ankle inversion      Ankle eversion       (Blank rows = not tested)    FUNCTIONAL TESTS:  Sit to stand transfer:  min assist.  Pt only able to stand for approx 5 second  10/10/22: STS from transport chair with SBA,  stands independently (CGA for safey) ~8 sec  GAIT: Comments: pt ambulated with FWW x 3 steps with min assist with W/C behind him.  Pt's legs were shaky.  He has bilat knee flexion and has decreased step length bilat.     TODAY'S TREATMENT:                                                                                                                               Prior to entry into pool:  STS and MMT (see above)  Pt seen for aquatic therapy today.  Treatment took place in water 3.5-4 ft in depth at the [facility] pool. Temp of water was 91.  Pt entered/exited the pool via chair lift with SBA for safety to/from WC (pt does squat-step- pivot transfer).  *seated in lift chair, submerged: cycling LEs, alternating LAQ, long sitting hip abdct/ addct * UE support on yellow noodle sba/sup  initiated in 3.6 ft then changed to water walker in 4 ft:  forward gait multiple widths /Backward (requires min-cga for control) *side stepping R and Left 4 ft * suspended supine in water walker using nekdoodle and other noodles to support under  waist: hip extension R/L with ankle cuffs for added resistance. * STS at bench holding noodle with SBA x 5 sec standing, Min A to return to bench due to posterior lean;  repeated STS with 10 sec standing balance  with CGA to return to sitting * return to lift chair for cycling prior to exit  Pt requires the buoyancy and hydrostatic pressure of water for support, and to offload joints by unweighting joint load by at least 50 % in navel deep water and by at least 75-80% in chest to neck deep water.  Viscosity of the water is needed for resistance of strengthening. Water current perturbations provides challenge to standing balance requiring increased core activation.    PATIENT EDUCATION:  Education details: reacquainting pt to aquatic therapy Person educated: Patient Education method: Explanation Education comprehension: verbalized understanding and needs further education  HOME EXERCISE PROGRAM: Did not give today.  Pt has a HEP.  ASSESSMENT:  CLINICAL IMPRESSION: Pt is not fearful of water, but due to his limited mobility and unsteadiness, requires therapist in the water with him.   Some increased difficulty with amb as compared to last session in 3.6 ft UE support on  noodle. Moved  to 4 ft and using water walker with improved control of movement. Pt demonstrated improved LE strength (see chart); has met LTG 4. He can complete STS but requires SBA for safety.   He is progressing  gradually towards remaining goals.   PN: Pt demonstrating improvement albeit slow.  He is rising from chair and gaining immediate standing balance with improved execution and with less assistance but it is not yet consistent.  He has had a significant increase in LE  strength as noted in chart. He participates well with very good toleration in aquatic setting.  He will be ready to begin/progress to land based intervention in the next few weeks.  Pt will continue to benefit from skilled PT intervention to improve his functional mobility and safety by decreasing fall risk.    OBJECTIVE IMPAIRMENTS: Abnormal gait, decreased activity tolerance, decreased balance, decreased coordination, decreased endurance, decreased mobility, difficulty walking, decreased ROM, decreased strength, hypomobility, impaired flexibility, postural dysfunction, and pain.   ACTIVITY LIMITATIONS: standing, squatting, stairs, transfers, and locomotion level  PARTICIPATION LIMITATIONS: meal prep, cleaning, laundry, driving, shopping, and community activity  PERSONAL FACTORS: Time since onset of injury/illness/exacerbation and 3+ comorbidities: kyphoplasties, gait imbalance, peripheral neuropathy, thoracic spondylosis, bilat TKA, cervical fusion  are also affecting patient's functional outcome.   REHAB POTENTIAL: Fair Pt has an extensive medical Hx with multiple co-morbidities.  Chronic pain.   CLINICAL DECISION MAKING: Evolving/moderate complexity  EVALUATION COMPLEXITY: Moderate   GOALS:   SHORT TERM GOALS:   Pt will tolerate aquatic therapy without adverse effects for improved mobility, tolerance to activity, and pain.  Baseline: Goal status: Met 09/21/22 Target date:  09/08/2022  2.  Pt will able to ambulate 25 feet with CGA with FWW.  Baseline:  Goal status: INITIAL Target date:  09/22/2022  3.  Pt will be able to progress aquatic exercises without adverse effects for improved tolerance to activity, mobility, and pain.  Baseline:  Goal status: Met 09/21/22 Target date:  09/29/2022  4.  Pt will be able to perform sit to stand independently.  Baseline:  Goal status: ONGOING - 10/10/22 Target date:  09/22/2022     LONG TERM GOALS: Target date: 10/20/2022  Pt will report  at least a 70% reduction in his lumbar pain.  Baseline:  Goal status: INITIAL  2.  Pt will be able to perform shower transfers without difficulty.  Baseline:  Goal status: INITIAL  3.  Pt will be able to ambulate > 50 ft with no > CGA for improved mobility without significant lumbar pain Baseline:  Goal status: INITIAL  4.  Pt will demo improved bilat LE strength by 7-10 lbs in hip flex, abd, and knee extension for improved performance of functional mobility.  Baseline:  Goal status: MET - 10/10/22    PLAN:  PT FREQUENCY: 2x/week  PT DURATION: 8 weeks  PLANNED INTERVENTIONS: Therapeutic exercises, Therapeutic activity, Neuromuscular re-education, Balance training, Gait training, Patient/Family education, Self Care, Joint mobilization, Stair training, Aquatic Therapy, Dry Needling, Cryotherapy, Moist heat, Taping, Ultrasound, Manual therapy, and Re-evaluation.  PLAN FOR NEXT SESSION: Cont with aquatic therapy.  No e-stim due to spinal cord stimulator.    Mayer Camel, PTA 10/10/22 1:01 PM [facility] [address] [place], [place], [address] Phone: [phone]   Fax:  [phone]  Addend Rushie Chestnut) Ziemba MPT 10/12/22 109pm

## 2022-10-12 ENCOUNTER — Ambulatory Visit (HOSPITAL_BASED_OUTPATIENT_CLINIC_OR_DEPARTMENT_OTHER): Payer: Medicare Other | Admitting: Physical Therapy

## 2022-10-12 ENCOUNTER — Encounter (HOSPITAL_BASED_OUTPATIENT_CLINIC_OR_DEPARTMENT_OTHER): Payer: Self-pay | Admitting: Physical Therapy

## 2022-10-12 DIAGNOSIS — M6281 Muscle weakness (generalized): Secondary | ICD-10-CM

## 2022-10-12 DIAGNOSIS — R262 Difficulty in walking, not elsewhere classified: Secondary | ICD-10-CM

## 2022-10-12 DIAGNOSIS — M5459 Other low back pain: Secondary | ICD-10-CM

## 2022-10-12 DIAGNOSIS — R278 Other lack of coordination: Secondary | ICD-10-CM

## 2022-10-12 NOTE — Therapy (Signed)
OUTPATIENT PHYSICAL THERAPY THORACOLUMBAR TREATMENT   Patient Name: [name] MRN: [id number] DOB:10-17-1945, 77 y.o., male Today's Date: 10/12/2022  END OF SESSION:  PT End of Session - 10/12/22 1230     Visit Number 11    Number of Visits 16    Date for PT Re-Evaluation 10/20/22    Authorization Type [employer] MCR    PT Start Time 1205    PT Stop Time 1245    PT Time Calculation (min) 40 min    Behavior During Therapy WFL for tasks assessed/performed              Past Medical History:  Diagnosis Date   Alcohol use 05/2018   daily   Ankylosing spondylitis of multiple sites in spine    Chronic pain    Chronically on opiate therapy    Pain Management d/c opiates 06/2018   Complication of anesthesia    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 pt has note from [facility] indicating difficulty   First degree AV block    chronic   Gait instability    Hypertension    Multiple falls    OSA (obstructive sleep apnea)    Pain management    Peripheral axonal neuropathy    Peripheral neuropathy 06/06/2018   Tremor    Past Surgical History:  Procedure Laterality Date   ANKLE SURGERY Right    orif   BACK SURGERY     BIOPSY  11/05/2018   Procedure: BIOPSY;  Surgeon: West Bali, MD;  [facility];  Service: Endoscopy;;   CHOLECYSTECTOMY     ESOPHAGOGASTRODUODENOSCOPY (EGD) WITH PROPOFOL N/A 11/05/2018   Procedure: ESOPHAGOGASTRODUODENOSCOPY (EGD) WITH PROPOFOL;  Surgeon: West Bali, MD;  [facility];  Service: Endoscopy;  Laterality: N/A;  10:30am-rescheduled to 4/7 @ 7:30am per office   JOINT REPLACEMENT Bilateral    knees   NECK SURGERY     cervical fusion   SPINAL CORD STIMULATOR INSERTION  2017   TOTAL KNEE ARTHROPLASTY     Patient Active Problem List   Diagnosis Date Noted   Weakness 09/27/2019   Abnormal CT scan    Nausea and vomiting    Opiate use    Diarrhea    Compression  fracture of T10 vertebra 07/25/2018   Duodenitis 07/25/2018   Aspiration pneumonia of both lower lobes due to gastric secretions 07/21/2018   Acute respiratory failure with hypoxia 07/21/2018   Acute kidney injury 07/20/2018   Dehydration 07/20/2018   Opiate overdose 07/20/2018   Peripheral neuropathy 06/06/2018   Acute encephalopathy 05/09/2018   Multiple falls 05/08/2018   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   OBSTRUCTIVE SLEEP APNEA 08/20/2007   Chronic pain syndrome 07/30/2007   Essential hypertension 07/30/2007   Ankylosing spondylitis 07/30/2007     REFERRING PROVIDER: Su Hoff, PA-C   REFERRING DIAG: M54.40 (ICD-10-CM) - Lumbago with sciatica, unspecified side   Rationale for Evaluation and Treatment: Rehabilitation  THERAPY DIAG:  Other low back pain  Muscle weakness (generalized)  Difficulty in walking, not elsewhere classified  Other lack of coordination  ONSET DATE: Chronic pain ; MD order 06/09/2022  SUBJECTIVE:  SUBJECTIVE STATEMENT: Pt reports increase in LBP today   PERTINENT HISTORY:  -Hx of Chronic back pain, spinal cord stimulator, thoracic spondylosis  -Diffuse idiopathic skeletal hyperostosis (DISH)/ankylosing spondylitis -Prior compression Fx at T6-T7, T10 resulting in kyphoplasties for 2 of them on 11/01/19 -Gait imbalance and falls-followed by neurology -EMG/NCS showed axonal polyneuropathy and peripheral neuropathy without lumbosacral radiculopathy per MD notes -Bilat TKA, cervical fusion, and R Ankle surgery   PAIN:  Are you having pain? Yes NPRS:  8/10 arriving; Location:  Central lumbar Pt has increased pain with rainy weather.  PRECAUTIONS: Fall and Other: chronic back pain, DISH, prior kyphoplasties, peripheral neuropathy, bilat TKA, cervical  suion  WEIGHT BEARING RESTRICTIONS: No  FALLS:  Has patient fallen in last 6 months? No  LIVING ENVIRONMENT: Lives with: lives with their family Lives in: 2 story home Stairs: ramp to enter home.  Pt hasn't gone on the 2nd floor in years.   Has following equipment at home: Dan HumphreysWalker - 2 wheeled, Environmental consultantWalker - 4 wheeled, Wheelchair (manual), and shower chair   PLOF: Pt has a hx of chronic lumbar pain and co-morbidities that have limited his ability to perform functional mobility and limited his tolerance to activity.   PATIENT GOALS: to be able stand for > 2-3 sec, to be able to ambulate with holding on to something    OBJECTIVE:   DIAGNOSTIC FINDINGS:  X rays in 2022: Moderate to advanced disc space height loss L5/S1.  Mod to mod dis space height loss t/o the remainder of the lumbar spine.  Prominent anterior osteophytes greatest L3/4 and L1/2.  Mod to advanced multilevel face deg changes. SP deg changes.  IMPRESSION: Multilevel deg changes of the lumbar spine.   MRI in 2015: IMPRESSION: 1. Multilevel degenerative disc disease and facet arthropathy, as above. The L2-L3 disc protrusion may produce left L3 radicular symptoms. The L5-S1 disc protrusion may produce bilateral S1 radicular symptoms, left greater than right. There is severe impingement of the exiting left L5 nerve root. Clinical correlation recommended.   PATIENT SURVEYS:  FOTO 14 with a goal of 35 at visit #14 7th visit 09/26/22: 52%   COGNITION: Overall cognitive status: Within functional limits for tasks assessed      OBSERVATION/POSTURE: Seated in W/C with increased FHP and rounded shoulder.  He is in cervical flexion in sitting.  PT checked bilat feet.  Skin was intact and PT didn't notice any wounds or any scabbing.      LOWER EXTREMITY ROM:     Active  Right eval Left eval  Hip flexion    Hip extension    Hip abduction    Hip adduction    Hip internal rotation    Hip external rotation    Knee  flexion    Knee extension    [facility]lexion [facility]  Ankle plantarflexion    Ankle inversion    Ankle eversion     (Blank rows = not tested)  LOWER EXTREMITY MMT:    MMT Right eval Left eval R 4/8 L 4/8  Hip flexion 13.4 13.1 21.1 21.5  Hip extension      Hip abduction 15.3 15.0 43.2 40.3  Hip adduction      Hip internal rotation      Hip external rotation      Knee flexion      Knee extension Unable to perform full knee extension seated ; 14.8 Unable to perform full knee extension seated ; 20.4 47.9 38.4  Ankle dorsiflexion  5/5 5/5    Ankle plantarflexion      Ankle inversion      Ankle eversion       (Blank rows = not tested)    FUNCTIONAL TESTS:  Sit to stand transfer:  min assist.  Pt only able to stand for approx 5 second  10/10/22: STS from transport chair with SBA,  stands independently (CGA for safey) ~8 sec  GAIT: Comments: pt ambulated with FWW x 3 steps with min assist with W/C behind him.  Pt's legs were shaky.  He has bilat knee flexion and has decreased step length bilat.     TODAY'S TREATMENT:                                                                                                                              Self care: Instruction on pool access (where, how) pt given list of pools  - progressing from pool to land based intervention (why and importance)  - having assistance in pool and when using any land based equipment for safety and positioning.  - encouraged wife to assist in pool going forward  Pt seen for aquatic therapy today.  Treatment took place in water 3.5-4 ft in depth at the [facility] pool. Temp of water was 91.  Pt entered/exited the pool via chair lift with sup for safety to/from WC (pt does squat-step- pivot transfer).  *seated in lift chair, submerged: cycling LEs, alternating LAQ, long sitting hip abdct/ addct * UE support on yellow noodle sba/sup  in 4 ft:  forward gait multiple widths /Backward (improvement in  control) *side stepping R and Left 4 ft x4 widths * STS at bench reaching for noodle (to encourage forward weight shift) * return to lift chair for cycling prior to exit  Pt requires the buoyancy and hydrostatic pressure of water for support, and to offload joints by unweighting joint load by at least 50 % in navel deep water and by at least 75-80% in chest to neck deep water.  Viscosity of the water is needed for resistance of strengthening. Water current perturbations provides challenge to standing balance requiring increased core activation.    PATIENT EDUCATION:  Education details: reacquainting pt to aquatic therapy Person educated: Patient Education method: Explanation Education comprehension: verbalized understanding and needs further education  HOME EXERCISE PROGRAM: Did not give today.  Pt has a HEP.  ASSESSMENT:  CLINICAL IMPRESSION: Pt is not fearful of water, but due to his limited mobility and unsteadiness, requires therapist in the water with him.   Pt with improved control of amb in all directions. Able to slow gait with backward amb to maintain balance and direct path. Once fatigued (after several widths) he does lose control/balance requiring min asst to recover.  Pt to be seen for land based assessment next session.  Aquatic therapist recommends progressing to alternating land and aquatic based sessions going forward.  Pt and wife considering membership to  be able to access a pool (as well as gym equipment) beyond DC from therapy.  Goals ongoing     OBJECTIVE IMPAIRMENTS: Abnormal gait, decreased activity tolerance, decreased balance, decreased coordination, decreased endurance, decreased mobility, difficulty walking, decreased ROM, decreased strength, hypomobility, impaired flexibility, postural dysfunction, and pain.   ACTIVITY LIMITATIONS: standing, squatting, stairs, transfers, and locomotion level  PARTICIPATION LIMITATIONS: meal prep, cleaning, laundry,  driving, shopping, and community activity  PERSONAL FACTORS: Time since onset of injury/illness/exacerbation and 3+ comorbidities: kyphoplasties, gait imbalance, peripheral neuropathy, thoracic spondylosis, bilat TKA, cervical fusion  are also affecting patient's functional outcome.   REHAB POTENTIAL: Fair Pt has an extensive medical Hx with multiple co-morbidities.  Chronic pain.   CLINICAL DECISION MAKING: Evolving/moderate complexity  EVALUATION COMPLEXITY: Moderate   GOALS:   SHORT TERM GOALS:   Pt will tolerate aquatic therapy without adverse effects for improved mobility, tolerance to activity, and pain.  Baseline: Goal status: Met 09/21/22 Target date:  09/08/2022  2.  Pt will able to ambulate 25 feet with CGA with FWW.  Baseline:  Goal status: INITIAL Target date:  09/22/2022  3.  Pt will be able to progress aquatic exercises without adverse effects for improved tolerance to activity, mobility, and pain.  Baseline:  Goal status: Met 09/21/22 Target date:  09/29/2022  4.  Pt will be able to perform sit to stand independently.  Baseline:  Goal status: ONGOING - 10/10/22 Target date:  09/22/2022     LONG TERM GOALS: Target date: 10/20/2022  Pt will report at least a 70% reduction in his lumbar pain.  Baseline:  Goal status: INITIAL  2.  Pt will be able to perform shower transfers without difficulty.  Baseline:  Goal status: INITIAL  3.  Pt will be able to ambulate > 50 ft with no > CGA for improved mobility without significant lumbar pain Baseline:  Goal status: INITIAL  4.  Pt will demo improved bilat LE strength by 7-10 lbs in hip flex, abd, and knee extension for improved performance of functional mobility.  Baseline:  Goal status: MET - 10/10/22    PLAN:  PT FREQUENCY: 2x/week  PT DURATION: 8 weeks  PLANNED INTERVENTIONS: Therapeutic exercises, Therapeutic activity, Neuromuscular re-education, Balance training, Gait training, Patient/Family education, Self  Care, Joint mobilization, Stair training, Aquatic Therapy, Dry Needling, Cryotherapy, Moist heat, Taping, Ultrasound, Manual therapy, and Re-evaluation.  PLAN FOR NEXT SESSION: Cont with aquatic therapy.  No e-stim due to spinal cord stimulator.    Corrie Dandy Agnew) Kelyn Koskela MPT 10/12/22 12:31 PM [facility] [address] [place], [place], [address] Phone: [phone]   Fax:  [phone]

## 2022-10-18 ENCOUNTER — Ambulatory Visit (HOSPITAL_BASED_OUTPATIENT_CLINIC_OR_DEPARTMENT_OTHER): Payer: Medicare Other | Admitting: Physical Therapy

## 2022-10-18 ENCOUNTER — Encounter (HOSPITAL_BASED_OUTPATIENT_CLINIC_OR_DEPARTMENT_OTHER): Payer: Self-pay | Admitting: Physical Therapy

## 2022-10-18 DIAGNOSIS — M5459 Other low back pain: Secondary | ICD-10-CM | POA: Diagnosis not present

## 2022-10-18 DIAGNOSIS — M6281 Muscle weakness (generalized): Secondary | ICD-10-CM

## 2022-10-18 DIAGNOSIS — R262 Difficulty in walking, not elsewhere classified: Secondary | ICD-10-CM

## 2022-10-18 NOTE — Therapy (Signed)
OUTPATIENT PHYSICAL THERAPY THORACOLUMBAR TREATMENT   Patient Name: [name] MRN: [id number] DOB:1946/02/21, 77 y.o., male Today's Date: 10/18/2022  END OF SESSION:  PT End of Session - 10/18/22 1229     Visit Number 12    Number of Visits --    Date for PT Re-Evaluation 02/17/23    Authorization Type UHC MCR    Progress Note Due on Visit 20    PT Start Time 1230    PT Stop Time 1312    PT Time Calculation (min) 42 min    Activity Tolerance Patient tolerated treatment well    Behavior During Therapy [facility] for tasks assessed/performed              Past Medical History:  Diagnosis Date   Alcohol use 05/2018   daily   Ankylosing spondylitis of multiple sites in spine    Chronic pain    Chronically on opiate therapy    Pain Management d/c opiates 06/2018   Complication of anesthesia    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 pt has note from [facility] indicating difficulty   First degree AV block    chronic   Gait instability    Hypertension    Multiple falls    OSA (obstructive sleep apnea)    Pain management    Peripheral axonal neuropathy    Peripheral neuropathy 06/06/2018   Tremor    Past Surgical History:  Procedure Laterality Date   ANKLE SURGERY Right    orif   BACK SURGERY     BIOPSY  11/05/2018   Procedure: BIOPSY;  Surgeon: West Bali, MD;  [facility];  Service: Endoscopy;;   CHOLECYSTECTOMY     ESOPHAGOGASTRODUODENOSCOPY (EGD) WITH PROPOFOL N/A 11/05/2018   Procedure: ESOPHAGOGASTRODUODENOSCOPY (EGD) WITH PROPOFOL;  Surgeon: West Bali, MD;  [facility];  Service: Endoscopy;  Laterality: N/A;  10:30am-rescheduled to 4/7 @ 7:30am per office   JOINT REPLACEMENT Bilateral    knees   NECK SURGERY     cervical fusion   SPINAL CORD STIMULATOR INSERTION  2017   TOTAL KNEE ARTHROPLASTY     Patient Active Problem List   Diagnosis Date Noted   Weakness 09/27/2019    Abnormal CT scan    Nausea and vomiting    Opiate use    Diarrhea    Compression fracture of T10 vertebra 07/25/2018   Duodenitis 07/25/2018   Aspiration pneumonia of both lower lobes due to gastric secretions 07/21/2018   Acute respiratory failure with hypoxia 07/21/2018   Acute kidney injury 07/20/2018   Dehydration 07/20/2018   Opiate overdose 07/20/2018   Peripheral neuropathy 06/06/2018   Acute encephalopathy 05/09/2018   Multiple falls 05/08/2018   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 It seems like when I was active with the aquatic therapy, I had an easier time getting out of bed, taking a shower, etc. Son states that he is getting in and out of car better. Pt reports doing upper body work in [employer] and sit <> stand from [facility] at desk.   PERTINENT HISTORY:  -Hx of Chronic back pain, spinal cord stimulator, thoracic spondylosis  -Diffuse idiopathic skeletal hyperostosis (DISH)/ankylosing spondylitis -Prior compression Fx at T6-T7, T10 resulting in kyphoplasties for 2 of them on 11/01/19 -Gait imbalance and falls-followed by neurology -EMG/NCS showed axonal polyneuropathy and peripheral neuropathy without lumbosacral radiculopathy per MD  notes -Bilat TKA, cervical fusion, and R Ankle surgery   PAIN:  Are you having pain? Yes NPRS:  8/10 arriving; Agg: sitting in chair Relieving factors: moving around Location:  Central lumbar Pt has increased pain with rainy weather.  PRECAUTIONS: Fall and Other: chronic back pain, DISH, prior kyphoplasties, peripheral neuropathy, bilat TKA, cervical suion  WEIGHT BEARING RESTRICTIONS: No  FALLS:  Has patient fallen in last 6 months? No  LIVING ENVIRONMENT: Lives with: lives with their family Lives in: 2 story home Stairs: ramp to enter home.  Pt hasn't gone on the 2nd floor in years.   Has following equipment at home: Dan Humphreys - 2 wheeled, Environmental consultant - 4 wheeled, Wheelchair (manual), and shower chair   PLOF: Pt has a hx of chronic lumbar pain and co-morbidities that have limited his ability to perform functional mobility and limited his tolerance to activity.   PATIENT GOALS: to be able stand for > 2-3 sec, to be able to ambulate with holding on to something or even just a walker or something (been about 2-3 yrs)    OBJECTIVE:   DIAGNOSTIC FINDINGS:  X rays in 2022: Moderate to advanced disc space height loss L5/S1.  Mod to mod dis space height loss t/o the remainder of the lumbar spine.  Prominent anterior osteophytes greatest L3/4 and L1/2.  Mod to advanced multilevel face deg changes. SP deg changes.  IMPRESSION: Multilevel deg changes of the lumbar spine.   MRI in 2015: IMPRESSION: 1. Multilevel degenerative disc disease and facet arthropathy, as above. The L2-L3 disc protrusion may produce left L3 radicular symptoms. The L5-S1 disc protrusion may produce bilateral S1 radicular symptoms, left greater than right. There is severe impingement of the exiting left L5 nerve root. Clinical correlation recommended.   PATIENT SURVEYS:  FOTO 14 with a goal of 35 at visit #14 7th visit 09/26/22: 52% 10/18/22: 19   COGNITION: Overall cognitive status: Within functional limits  for tasks assessed      OBSERVATION/POSTURE: Seated in W/C with increased FHP and rounded shoulder.  He is in cervical flexion in sitting.  PT checked bilat feet.  Skin was intact and PT didn't notice any wounds or any scabbing.      LOWER EXTREMITY ROM:     Active  Right eval Left eval  Hip flexion    Hip extension    Hip abduction    Hip adduction    Hip internal rotation    Hip external rotation    Knee flexion    Knee extension    Ankle dorsiflexion [facility]  Ankle plantarflexion    Ankle inversion    Ankle eversion     (Blank rows = not tested)  LOWER EXTREMITY MMT:    MMT Right eval Left eval R 4/8 L 4/8  Hip flexion 13.4 13.1 21.1 21.5  Hip extension      Hip abduction 15.3 15.0 43.2 40.3  Hip adduction      Hip internal rotation      Hip external rotation      Knee flexion      Knee extension Unable to perform full knee extension seated ; 14.8 Unable to perform full knee extension seated ; 20.4 47.9 38.4  Ankle dorsiflexion 5/5 5/5    Ankle plantarflexion      Ankle inversion      Ankle eversion       (Blank rows = not tested)    FUNCTIONAL TESTS:  Sit to stand transfer:  min assist.  Pt only able to stand for approx 5 second  10/10/22: STS from transport chair with SBA,  stands independently (CGA for safey) ~8 sec  GAIT: Comments: pt ambulated with FWW x 3 steps with min assist with W/C behind him.  Pt's legs were shaky.  He has bilat knee flexion and has decreased step length bilat.     TODAY'S TREATMENT:                                                                                                                               Treatment                            10/18/22:  Standing at counter- quad and glut set Seated LAQ with hold for hamstring stretch Horiz abd for chest stretch Scap retraction   PATIENT EDUCATION:  Education details: Anatomy of condition, POC, HEP, exercise form/rationale Person educated: Patient Education method:  Explanation Education comprehension: verbalized understanding and needs further education  HOME EXERCISE PROGRAM: Continue with exercises but perform fewer reps/more frequently through the day.   ASSESSMENT:  CLINICAL IMPRESSION: Pt is not fearful of water, but due to his limited mobility and unsteadiness, requires therapist in the water with him.   Pt is lacking approx 30 deg of full knee extension as well as approx 20 deg hip ext in standing making it very challenging. Adding a hold to LAQ for hamstring stretch in HEP. Will continue 2/week alternating pool and land to continue challenging functional activity. While he is not experiencing improvement in pain levels, family is noting improvements in ADLs such as getting into and out of a vehicle. He is very motivated and will continue to benefit from skilled PT. Considering options for memberships and continued exercise following d/c. Would like to get to a point where he is independent in the pool, even if he has a float on his waist, to improve long term compliance with exercise.      OBJECTIVE IMPAIRMENTS: Abnormal gait, decreased activity tolerance, decreased balance, decreased coordination, decreased endurance, decreased mobility, difficulty walking, decreased ROM, decreased strength, hypomobility, impaired flexibility, postural dysfunction, and pain.   ACTIVITY LIMITATIONS: standing, squatting, stairs, transfers, and  locomotion level  PARTICIPATION LIMITATIONS: meal prep, cleaning, laundry, driving, shopping, and community activity  PERSONAL FACTORS: Time since onset of injury/illness/exacerbation and 3+ comorbidities: kyphoplasties, gait imbalance, peripheral neuropathy, thoracic spondylosis, bilat TKA, cervical fusion  are also affecting patient's functional outcome.   REHAB POTENTIAL: Fair Pt has an extensive medical Hx with multiple co-morbidities.  Chronic pain.   CLINICAL DECISION MAKING: Evolving/moderate  complexity  EVALUATION COMPLEXITY: Moderate   GOALS:   SHORT TERM GOALS:   Pt will tolerate aquatic therapy without adverse effects for improved mobility, tolerance to activity, and pain.  Baseline: Goal status: Met 09/21/22 Target date:  09/08/2022  2.  Pt will able to ambulate 25 feet with CGA with FWW.  Baseline:  Goal status: ongoing Target date:  09/22/2022  3.  Pt will be able to progress aquatic exercises without adverse effects for improved tolerance to activity, mobility, and pain.  Baseline:  Goal status: Met 09/21/22 Target date:  09/29/2022  4.  Pt will be able to perform sit to stand independently.  Baseline:  Goal status: ONGOING - 10/10/22 Target date:  09/22/2022     LONG TERM GOALS: Target date: POC date  Pt will report at least a 70% reduction in his lumbar pain.  Baseline:  Goal status: ongoing  2.  Pt will be able to perform shower transfers without difficulty.  Baseline:  Goal status: ongoing  3.  Pt will be able to ambulate > 50 ft with no > CGA for improved mobility without significant lumbar pain Baseline:  Goal status: ongoing  4.  Pt will demo improved bilat LE strength by 7-10 lbs in hip flex, abd, and knee extension for improved performance of functional mobility.  Baseline:  Goal status: MET - 10/10/22    PLAN:  PT FREQUENCY: 2x/week  PT DURATION: 8 weeks  PLANNED INTERVENTIONS: Therapeutic exercises, Therapeutic activity, Neuromuscular re-education, Balance training, Gait training, Patient/Family education, Self Care, Joint mobilization, Stair training, Aquatic Therapy, Dry Needling, Cryotherapy, Moist heat, Taping, Ultrasound, Manual therapy, and Re-evaluation.  PLAN FOR NEXT SESSION: Cont with aquatic therapy.  CKC strength on land. Hip flexor and HS flexibility. No e-stim due to spinal cord stimulator.    Lonia Roane C. Donovon Micheletti PT, DPT 10/18/22 1:35 PM  [facility] [address] [place], [place], [address] Phone: [phone]   Fax:  [phone]

## 2022-11-10 ENCOUNTER — Encounter (HOSPITAL_BASED_OUTPATIENT_CLINIC_OR_DEPARTMENT_OTHER): Payer: Medicare Other | Admitting: Physical Therapy

## 2022-11-15 ENCOUNTER — Ambulatory Visit (HOSPITAL_BASED_OUTPATIENT_CLINIC_OR_DEPARTMENT_OTHER): Payer: Medicare Other | Admitting: Physical Therapy

## 2022-11-16 ENCOUNTER — Ambulatory Visit (HOSPITAL_BASED_OUTPATIENT_CLINIC_OR_DEPARTMENT_OTHER): Payer: Medicare Other | Admitting: Physical Therapy

## 2022-11-18 ENCOUNTER — Encounter (HOSPITAL_BASED_OUTPATIENT_CLINIC_OR_DEPARTMENT_OTHER): Payer: Medicare Other | Admitting: Physical Therapy

## 2022-11-21 ENCOUNTER — Ambulatory Visit (HOSPITAL_BASED_OUTPATIENT_CLINIC_OR_DEPARTMENT_OTHER): Payer: Medicare Other | Admitting: Physical Therapy

## 2022-11-23 ENCOUNTER — Encounter (HOSPITAL_BASED_OUTPATIENT_CLINIC_OR_DEPARTMENT_OTHER): Payer: Medicare Other | Admitting: Physical Therapy

## 2022-11-29 ENCOUNTER — Ambulatory Visit (HOSPITAL_BASED_OUTPATIENT_CLINIC_OR_DEPARTMENT_OTHER): Payer: Medicare Other | Admitting: Physical Therapy

## 2022-12-01 ENCOUNTER — Encounter (HOSPITAL_BASED_OUTPATIENT_CLINIC_OR_DEPARTMENT_OTHER): Payer: Medicare Other | Admitting: Physical Therapy

## 2022-12-05 ENCOUNTER — Ambulatory Visit (HOSPITAL_BASED_OUTPATIENT_CLINIC_OR_DEPARTMENT_OTHER): Payer: Medicare Other | Admitting: Physical Therapy

## 2022-12-07 ENCOUNTER — Encounter (HOSPITAL_BASED_OUTPATIENT_CLINIC_OR_DEPARTMENT_OTHER): Payer: Medicare Other | Admitting: Physical Therapy

## 2022-12-13 ENCOUNTER — Ambulatory Visit (HOSPITAL_BASED_OUTPATIENT_CLINIC_OR_DEPARTMENT_OTHER): Payer: Medicare Other | Admitting: Physical Therapy

## 2022-12-16 ENCOUNTER — Encounter (HOSPITAL_BASED_OUTPATIENT_CLINIC_OR_DEPARTMENT_OTHER): Payer: Medicare Other | Admitting: Physical Therapy

## 2022-12-19 ENCOUNTER — Ambulatory Visit (HOSPITAL_BASED_OUTPATIENT_CLINIC_OR_DEPARTMENT_OTHER): Payer: Medicare Other | Admitting: Physical Therapy

## 2022-12-21 ENCOUNTER — Ambulatory Visit (HOSPITAL_BASED_OUTPATIENT_CLINIC_OR_DEPARTMENT_OTHER): Payer: Medicare Other | Admitting: Physical Therapy

## 2022-12-26 ENCOUNTER — Ambulatory Visit (HOSPITAL_BASED_OUTPATIENT_CLINIC_OR_DEPARTMENT_OTHER): Payer: Medicare Other | Admitting: Physical Therapy

## 2022-12-29 ENCOUNTER — Encounter (HOSPITAL_BASED_OUTPATIENT_CLINIC_OR_DEPARTMENT_OTHER): Payer: Medicare Other | Admitting: Physical Therapy

## 2023-01-02 ENCOUNTER — Ambulatory Visit (HOSPITAL_BASED_OUTPATIENT_CLINIC_OR_DEPARTMENT_OTHER): Payer: Medicare Other | Admitting: Physical Therapy

## 2023-01-04 ENCOUNTER — Encounter (HOSPITAL_BASED_OUTPATIENT_CLINIC_OR_DEPARTMENT_OTHER): Payer: Medicare Other | Admitting: Physical Therapy

## 2023-01-09 ENCOUNTER — Ambulatory Visit (HOSPITAL_BASED_OUTPATIENT_CLINIC_OR_DEPARTMENT_OTHER): Payer: Medicare Other | Admitting: Physical Therapy

## 2023-01-09 ENCOUNTER — Encounter (HOSPITAL_BASED_OUTPATIENT_CLINIC_OR_DEPARTMENT_OTHER): Payer: Medicare Other | Attending: General Surgery | Admitting: General Surgery

## 2023-01-09 DIAGNOSIS — L89613 Pressure ulcer of right heel, stage 3: Secondary | ICD-10-CM | POA: Insufficient documentation

## 2023-01-09 DIAGNOSIS — Z96653 Presence of artificial knee joint, bilateral: Secondary | ICD-10-CM | POA: Diagnosis not present

## 2023-01-09 DIAGNOSIS — L89623 Pressure ulcer of left heel, stage 3: Secondary | ICD-10-CM | POA: Diagnosis not present

## 2023-01-09 DIAGNOSIS — E11621 Type 2 diabetes mellitus with foot ulcer: Secondary | ICD-10-CM | POA: Insufficient documentation

## 2023-01-09 DIAGNOSIS — I10 Essential (primary) hypertension: Secondary | ICD-10-CM | POA: Insufficient documentation

## 2023-01-09 DIAGNOSIS — E1142 Type 2 diabetes mellitus with diabetic polyneuropathy: Secondary | ICD-10-CM | POA: Diagnosis not present

## 2023-01-09 DIAGNOSIS — M1990 Unspecified osteoarthritis, unspecified site: Secondary | ICD-10-CM | POA: Diagnosis not present

## 2023-01-09 DIAGNOSIS — L89523 Pressure ulcer of left ankle, stage 3: Secondary | ICD-10-CM | POA: Insufficient documentation

## 2023-01-09 DIAGNOSIS — L97322 Non-pressure chronic ulcer of left ankle with fat layer exposed: Secondary | ICD-10-CM | POA: Insufficient documentation

## 2023-01-09 DIAGNOSIS — G4730 Sleep apnea, unspecified: Secondary | ICD-10-CM | POA: Diagnosis not present

## 2023-01-09 NOTE — Progress Notes (Signed)
ZAKI, PLASKY ([id number]) [id number] Page 1 of 4 Visit Report for 01/09/2023 Chief Complaint Document Details Patient Name: Date of Service: [name] [name]. 01/09/2023 9:00 A [name] Medical Record Number: [id number] Patient Account Number: [id number] Date of Birth/Sex: Treating RN: [birth year] (77 y.o. Male) Primary Care Provider: Delano Metz Other Clinician: Referring Provider: Treating Provider/Extender: Duanne Guess Corrington, Kip Weeks in Treatment: 0 Information Obtained from: Patient Chief Complaint Patient presents to the wound care center with open non-healing surgical wound(s) Electronic Signature(s) Signed: 01/09/2023 9:34:34 AM By: Duanne Guess MD FACS Entered By: Duanne Guess on 01/09/2023 09:34:34 -------------------------------------------------------------------------------- HPI Details Patient Name: Date of Service: [name]. 01/09/2023 9:00 A [name] Medical Record Number: [id number] Patient Account Number: [id number] Date of Birth/Sex: Treating RN: 1946-02-15 (77 y.o. Male) Primary Care Provider: Delano Metz Other Clinician: Referring Provider: Treating Provider/Extender: Duanne Guess Corrington, Kip Weeks in Treatment: 0 History of Present Illness HPI Description: ADMISSION 01/09/2023 This is a 77 year old known diabetic who underwent a left ankle ORIF on Nov 26, 2022. He was seen by orthopedic surgery for follow-up on December 26, 2022 and was noted to have breakdown of his left lateral leg incision. He was also given a course of Keflex at that time. He was referred to the wound care center for further evaluation and management. Electronic Signature(s) Signed: 01/09/2023 9:36:08 AM By: Duanne Guess MD FACS Entered By: Duanne Guess on 01/09/2023 09:36:08 -------------------------------------------------------------------------------- Problem List Details Patient Name: Date of Service: [name]. 01/09/2023 9:00 A [name] Medical Record Number: [id number] Patient Account Number: [id number] Date of Birth/Sex: Treating RN: [birth year] (77 y.o. Male) Primary Care Provider: Delano Metz Other Clinician: Referring Provider: Treating Provider/Extender: Truc, Hetland ([id number]) [id number] Page 2 of 4 Weeks in Treatment: 0 Active Problems ICD-10 Encounter Code Description Active Date MDM Diagnosis L97.322 Non-pressure chronic ulcer of left ankle with fat layer exposed 01/09/2023 No Yes G62.9 Polyneuropathy, unspecified 01/09/2023 No Yes M45.9 Ankylosing spondylitis of unspecified sites in spine 01/09/2023 No Yes Inactive Problems Resolved Problems Electronic Signature(s) Signed: 01/09/2023 9:34:22 AM By: Duanne Guess MD FACS Entered By: Duanne Guess on 01/09/2023 09:34:22 -------------------------------------------------------------------------------- HxROS Details Patient Name: Date of Service: [name]. 01/09/2023 9:00 A [name] Medical Record Number: [id number] Patient Account Number: [id number] Date of Birth/Sex: Treating RN: August 26, 1945 (77 y.o. Male) [name] Primary Care Provider: Delano Metz Other Clinician: Referring Provider: Treating Provider/Extender: Duanne Guess Corrington, Kip Weeks in Treatment: 0 Information Obtained From Patient Caregiver Chart Constitutional Symptoms (General Health) Complaints and Symptoms: Negative for: Fatigue; Fever; Chills; Marked Weight Change Eyes Complaints and Symptoms: Negative for: Dry Eyes; Vision Changes; Glasses / Contacts Medical History: Positive for: Cataracts - bil extractions Negative for: Glaucoma; Optic Neuritis Ear/Nose/Mouth/Throat Complaints and Symptoms: Negative for: Chronic sinus problems or rhinitis Medical History: Negative for: Chronic sinus problems/congestion; Middle ear  problems Cardiovascular Complaints and Symptoms: Negative for: Chest pain Medical History: [name], [name] ([id number]) [id number] Page 3 of 4 Positive for: Hypertension Gastrointestinal Complaints and Symptoms: Negative for: Frequent diarrhea; Nausea; Vomiting Medical History: Past Medical History Notes: duodenitis, constipation Endocrine Complaints and Symptoms: Negative for: Heat/cold intolerance Medical History: Negative for: Type I Diabetes; Type II Diabetes Genitourinary Complaints and Symptoms: Negative for: Frequent urination Medical History: Negative for: End Stage Renal Disease Integumentary (Skin) Complaints and Symptoms: Positive for: Wounds - multiple on lower extremities Medical History: Negative for: History of Burn Musculoskeletal Complaints and Symptoms: Positive  for: Muscle Weakness Negative for: Muscle Pain Medical History: Positive for: Osteoarthritis Negative for: Gout; Rheumatoid Arthritis; Osteomyelitis Past Medical History Notes: ankylosing spondylitis, compression fx T10 Neurologic Complaints and Symptoms: Positive for: Numbness/parasthesias - feet Medical History: Negative for: Dementia; Neuropathy; Quadriplegia; Paraplegia; Seizure Disorder Psychiatric Complaints and Symptoms: Negative for: Claustrophobia Medical History: Negative for: Anorexia/bulimia; Confinement Anxiety Hematologic/Lymphatic Respiratory Medical History: Positive for: Sleep Apnea Past Medical History Notes: hx aspiration PNA, asbestosis Immunological Medical History: Negative for: Lupus Erythematosus; Raynauds; Scleroderma Oncologic Medical History: Negative for: Received Chemotherapy; Received Radiation [name], [name] ([id number]) [id number] Page 4 of 4 HBO Extended History Items Eyes: Cataracts Immunizations Pneumococcal Vaccine: Received Pneumococcal Vaccination: Yes Received  Pneumococcal Vaccination On or After 60th Birthday: Yes Implantable Devices Yes Hospitalization / Surgery History Type of Hospitalization/Surgery bil knee replacements ORIF left ankel 5/23 2024 cholecystectomy cervical fusion Family and Social History Cancer: No; Diabetes: No; Heart Disease: Yes - Mother,Father; Hereditary Spherocytosis: No; Hypertension: Yes - Mother,Father; Kidney Disease: No; Lung Disease: No; Seizures: No; Stroke: Yes - Father; Thyroid Problems: No; Tuberculosis: No; Never smoker; Marital Status - Married; Alcohol Use: Rarely; Drug Use: No History; Caffeine Use: Daily - coffee; Financial Concerns: No; Food, Clothing or Shelter Needs: No; Support System Lacking: No; Transportation Concerns: No Electronic Signature(s) Unsigned Entered By: [name] on 01/09/2023 09:48:02 Signature(s): Date(s):

## 2023-01-10 NOTE — Progress Notes (Signed)
DEKODA, FRANSSEN ([id number]) [id number] Page 1 of 13 Visit Report for 01/09/2023 Allergy List Details Patient Name: Date of Service: [name] [name]. 01/09/2023 9:00 A M Medical Record Number: [id number] Patient Account Number: [id number] Date of Birth/Sex: Treating RN: August 17, 1945 (77 y.o. [name] Primary Care Branda Chaudhary: Vilinda Boehringer, DHA NA [facility] Other Clinician: Referring Joffrey Kerce: Treating Daris Harkins/Extender: Duanne Guess [name], [name] Allergies Active Allergies codeine Reaction: nausea Allergy Notes Electronic Signature(s) Signed: 01/09/2023 5:51:31 PM By: Zenaida Deed RN, BSN Entered By: Zenaida Deed on 01/09/2023 09:38:58 -------------------------------------------------------------------------------- Arrival Information Details Patient Name: Date of Service: Neil Pearson M M. 01/09/2023 9:00 A M Medical Record Number: [id number] Patient Account Number: [id number] Date of Birth/Sex: Treating RN: 1945/09/13 (77 y.o. M) Primary Care Phoebe Marter: Vilinda Boehringer, DHA NA [facility] Other Clinician: Referring Dawnita Molner: Treating Stevens Magwood/Extender: Duanne Guess [name], [name] Visit Information Patient Arrived: Wheel Chair Arrival Time: 09:12 Accompanied By: self Transfer Assistance: None Patient Identification Verified: Yes Secondary Verification Process Completed: Yes Patient Requires Transmission-Based Precautions: No Patient Has Alerts: No Electronic Signature(s) Signed: 01/09/2023 5:51:31 PM By: Zenaida Deed RN, BSN Entered By: Zenaida Deed on 01/09/2023 09:37:52 -------------------------------------------------------------------------------- Clinic Level of Care Assessment Details Patient Name: Date of Service: Neil [facility] Rockne Coons M M. 01/09/2023 9:00 A KEMARRI, ARMENTROUT ([id number]) [id number] Page 2 of 13 Medical Record Number:  [id number] Patient Account Number: [id number] Date of Birth/Sex: Treating RN: 08/12/1945 (77 y.o. [name] Primary Care Bao Bazen: Vilinda Boehringer, DHA NA [facility] Other Clinician: Referring Hurman Ketelsen: Treating Channon Ambrosini/Extender: Duanne Guess [name], [name] Clinic Level of Care Assessment Items TOOL 1 Quantity Score []  - [name] Use when EandM and Procedure is performed on INITIAL visit ASSESSMENTS - Nursing Assessment / Reassessment X- 1 20 General Physical Exam (combine w/ comprehensive assessment (listed just below) when performed on new pt. evals) X- 1 25 Comprehensive Assessment (HX, ROS, Risk Assessments, Wounds Hx, etc.) ASSESSMENTS - Wound and Skin Assessment / Reassessment []  - [name] Dermatologic / Skin Assessment (not related to wound area) ASSESSMENTS - Ostomy and/or Continence Assessment and Care []  - [name] Incontinence Assessment and Management []  - [name] Ostomy Care Assessment and Management (repouching, etc.) PROCESS - Coordination of Care X - Simple Patient / Family Education for ongoing care 1 15 []  - [name] Complex (extensive) Patient / Family Education for ongoing care X- 1 10 Staff obtains Chiropractor, Records, T Results / Process Orders est X- 1 10 Staff telephones HHA, Nursing Homes / Clarify orders / etc []  - [name] Routine Transfer to another Facility (non-emergent condition) []  - [name] Routine Hospital Admission (non-emergent condition) X- 1 15 New Admissions / Manufacturing engineer / Ordering NPWT Apligraf, etc. , []  - [name] Emergency Hospital Admission (emergent condition) PROCESS - Special Needs []  - [name] Pediatric / Minor Patient Management []  - [name] Isolation Patient Management []  - [name] Hearing / Language / Visual special needs []  - [name] Assessment of Community assistance (transportation, D/C planning, etc.) []  - [name] Additional assistance / Altered mentation []  - [name] Support Surface(s) Assessment (bed, cushion, seat, etc.) INTERVENTIONS - Miscellaneous []  -  [name] External ear exam []  - [name] Patient Transfer (multiple staff / Nurse, adult / Similar devices) []  - [name] Simple Staple / Suture removal (25 or less) []  - [name] Complex Staple / Suture removal (26 or more) []  - [name] Hypo/Hyperglycemic Management (do not check if billed separately) X- 1 15 Ankle / Brachial Index (  ABI) - do not check if billed separately Has the patient been seen at the hospital within the last three years: Yes Total Score: 110 Level Of Care: New/Established - Level 3 Electronic Signature(s) Signed: 01/09/2023 5:51:31 PM By: Zenaida Deed RN, BSN Entered By: Zenaida Deed on 01/09/2023 16:50:52 [name] ([id number]) [id number] Page 3 of 13 -------------------------------------------------------------------------------- Lower Extremity Assessment Details Patient Name: Date of Service: [name]. 01/09/2023 9:00 A M Medical Record Number: [id number] Patient Account Number: [id number] Date of Birth/Sex: Treating RN: Nov 17, 1945 (77 y.o. [name] Primary Care Travez Stancil: Vilinda Boehringer, DHA NA [facility] Other Clinician: Referring Amandalynn Pitz: Treating Lamontae Ricardo/Extender: Duanne Guess [name], [name] Edema Assessment Assessed: [Left: No] [Right: No] Edema: [Left: Yes] [Right: Yes] Calf Left: Right: Point of Measurement: From Medial Instep 38 cm 39.5 cm Ankle Left: Right: Point of Measurement: From Medial Instep 28.2 cm 27 cm Knee To Floor Left: Right: From Medial Instep 39 cm Vascular Assessment Pulses: Dorsalis Pedis Palpable: [Left:No] [Right:Yes] Doppler Audible: [Left:Yes] [Right:Yes] Posterior Tibial Doppler Audible: [Left:Yes] [Right:Yes] Blood Pressure: Brachial: [Left:119] [Right:119] Dorsalis Pedis: 150 Ankle: Posterior Tibial: 60 Ankle Brachial Index: [Left:1.26] Notes Right DP pulse noncompressible Electronic Signature(s) Signed: 01/09/2023 5:51:31 PM By: Zenaida Deed RN,  BSN Entered By: Zenaida Deed on 01/09/2023 10:05:03 -------------------------------------------------------------------------------- Multi Wound Chart Details Patient Name: Date of Service: Neil Pearson M M. 01/09/2023 9:00 A M Medical Record Number: [id number] Patient Account Number: [id number] Date of Birth/Sex: Treating RN: 1946/05/09 (77 y.o. M) Primary Care Gray Maugeri: Vilinda Boehringer, DHA NA [facility] Other Clinician: Referring Amirr Achord: Treating Anais Koenen/Extender: Duanne Guess [name], [name] Vital Signs Height(in): 72 Pulse(bpm): 61 Weight(lbs): 240 Blood Pressure(mmHg): 119/61 [name], [name] ([id number]) [id number] Page 4 of 13 Body Mass Index(BMI): 32.5 Temperature(F): 98.[name] Respiratory Rate(breaths/min): 20 [1:Photos:] Left, Lateral Lower Leg Left, Posterior Lower Leg Left, Medial Ankle Wound Location: Surgical Injury Pressure Injury Pressure Injury Wounding Event: Open Surgical Wound Pressure Ulcer Pressure Ulcer Primary Etiology: Cataracts, Sleep Apnea, Hypertension, Cataracts, Sleep Apnea, Hypertension, Cataracts, Sleep Apnea, Hypertension, Comorbid History: Osteoarthritis Osteoarthritis Osteoarthritis 11/24/2022 12/03/2022 12/03/2022 Date Acquired: [name] [name] [name] Weeks of Treatment: Open Open Open Wound Status: No No No Wound Recurrence: 3.6x0.9x0.1 3x0.5x0.1 1.4x0.5x0.1 Measurements L x W x D (cm) 2.545 1.178 [name].55 A (cm) : rea [name].254 [name].118 [name].055 Volume (cm) : Full Thickness Without Exposed Category/Stage III Category/Stage III Classification: Support Structures Medium Medium Small Exudate A mount: Serosanguineous Serosanguineous Serosanguineous Exudate Type: red, brown red, brown red, brown Exudate Color: Distinct, outline attached Flat and Intact Flat and Intact Wound Margin: None Present ([name]%) Small (1-33%) Small (1-33%) Granulation A mount: N/A Red Pink Granulation Quality: Large (67-100%) Large  (67-100%) Large (67-100%) Necrotic A mount: Adherent Slough Adherent [employer] Necrotic Tissue: Fat Layer (Subcutaneous Tissue): Yes Fat Layer (Subcutaneous Tissue): Yes Fat Layer (Subcutaneous Tissue): Yes Exposed Structures: Fascia: No Fascia: No Fascia: No Tendon: No Tendon: No Tendon: No Muscle: No Muscle: No Muscle: No Joint: No Joint: No Joint: No Bone: No Bone: No Bone: No Small (1-33%) Small (1-33%) Small (1-33%) Epithelialization: Debridement - Excisional Debridement - Excisional Debridement - Excisional Debridement: Pre-procedure Verification/Time Out 10:25 10:25 10:25 Taken: Lidocaine 4% Topical Solution Lidocaine 4% Topical Solution Lidocaine 4% Topical Solution Pain Control: Subcutaneous, Slough Subcutaneous, Slough Subcutaneous, Slough Tissue Debrided: Skin/Subcutaneous Tissue Skin/Subcutaneous Tissue Skin/Subcutaneous Tissue Level: 2.54 1.18 [name].55 Debridement A (sq cm): rea Curette Curette Curette Instrument: Minimum Minimum Minimum Bleeding: Pressure Pressure Pressure Hemostasis  A chieved: 2 2 2  Procedural Pain: 1 1 1  Post Procedural Pain: Procedure was tolerated well Procedure was tolerated well Procedure was tolerated well Debridement Treatment Response: 3.6x0.9x0.1 3x0.5x0.1 1.4x0.5x0.1 Post Debridement Measurements L x W x D (cm) [name].254 [name].118 [name].055 Post Debridement Volume: (cm) N/A Category/Stage III Category/Stage III Post Debridement Stage: No Abnormalities Noted No Abnormalities Noted No Abnormalities Noted Periwound Skin Texture: No Abnormalities Noted No Abnormalities Noted No Abnormalities Noted Periwound Skin Moisture: No Abnormalities Noted No Abnormalities Noted No Abnormalities Noted Periwound Skin Color: No Abnormality No Abnormality No Abnormality Temperature: Debridement Debridement Debridement Procedures Performed: Wound Number: 4 5 6  Photos: Left, Lateral Foot Left Knee Right Calcaneus Wound  Location: Pressure Injury Trauma Pressure Injury Wounding Event: Pressure Ulcer Abrasion Pressure Ulcer Primary Etiology: Cataracts, Sleep Apnea, Hypertension,Cataracts, Sleep Apnea, Hypertension,Cataracts, Sleep Apnea, Hypertension, Comorbid History: NOAHJAMES, BAYLISS ([id number]) [id number].pdf Page 5 of 13 Osteoarthritis Osteoarthritis Osteoarthritis 12/03/2022 12/03/2022 12/03/2022 Date Acquired: [name] [name] [name] Weeks of Treatment: Open Open Open Wound Status: No No No Wound Recurrence: [name].8x0.9x0.1 [name].5x0.5x0.1 2.6x3.5x0.1 Measurements L x W x D (cm) [name].565 [name].196 7.147 A (cm) : rea [name].057 [name].02 [name].715 Volume (cm) : Category/Stage III Full Thickness Without Exposed Unstageable/Unclassified Classification: Support Structures Medium Medium Medium Exudate A mount: Serosanguineous Serosanguineous Serosanguineous Exudate Type: red, brown red, brown red, brown Exudate Color: Flat and Intact Flat and Intact Flat and Intact Wound Margin: Small (1-33%) Large (67-100%) Small (1-33%) Granulation A mount: Red Red Red Granulation Quality: Large (67-100%) None Present ([name]%) Large (67-100%) Necrotic A mount: Adherent Slough N/A Eschar, Adherent Slough Necrotic Tissue: Fat Layer (Subcutaneous Tissue): Yes Fat Layer (Subcutaneous Tissue): Yes Fat Layer (Subcutaneous Tissue): Yes Exposed Structures: Fascia: No Fascia: No Fascia: No Tendon: No Tendon: No Tendon: No Muscle: No Muscle: No Muscle: No Joint: No Joint: No Joint: No Bone: No Bone: No Bone: No Small (1-33%) Small (1-33%) Medium (34-66%) Epithelialization: Debridement - Excisional N/A Debridement - Excisional Debridement: Pre-procedure Verification/Time Out 10:25 N/A 10:25 Taken: Lidocaine 4% Topical Solution N/A Lidocaine 4% Topical Solution Pain Control: Subcutaneous, Slough N/A Subcutaneous, Slough Tissue Debrided: Skin/Subcutaneous Tissue N/A Skin/Subcutaneous Tissue Level: [name].57 N/A  7.69 Debridement A (sq cm): rea Curette N/A Curette Instrument: Minimum N/A Minimum Bleeding: Pressure N/A Pressure Hemostasis A chieved: 2 N/A 2 Procedural Pain: 1 N/A 1 Post Procedural Pain: Procedure was tolerated well N/A Procedure was tolerated well Debridement Treatment Response: [name].8x0.9x0.1 N/A 2.8x3.5x0.1 Post Debridement Measurements L x W x D (cm) [name].057 N/A [name].77 Post Debridement Volume: (cm) Category/Stage III N/A Unstageable/Unclassified Post Debridement Stage: No Abnormalities Noted No Abnormalities Noted No Abnormalities Noted Periwound Skin Texture: No Abnormalities Noted No Abnormalities Noted Dry/Scaly: Yes Periwound Skin Moisture: No Abnormalities Noted No Abnormalities Noted No Abnormalities Noted Periwound Skin Color: No Abnormality No Abnormality No Abnormality Temperature: Yes N/A N/A Tenderness on Palpation: Debridement N/A Debridement Procedures Performed: Treatment Notes Electronic Signature(s) Signed: 01/09/2023 11:06:15 AM By: Duanne Guess MD FACS Entered By: Duanne Guess on 01/09/2023 11:06:15 -------------------------------------------------------------------------------- Multi-Disciplinary Care Plan Details Patient Name: Date of Service: Neil Blalock MSO Rockne Coons M M. 01/09/2023 9:00 A M Medical Record Number: [id number] Patient Account Number: [id number] Date of Birth/Sex: Treating RN: [birth year] (77 y.o. [name] Primary Care Iyonna Rish: Vilinda Boehringer, [facility] NA [facility] Other Clinician: Referring Kaiven Vester: Treating Tobiah Celestine/Extender: Lendon Ka Weeks in Treatment: [name] Multidisciplinary Care Plan reviewed with physician Active Inactive Abuse / Safety / Falls / Self Care Management GRISHAM, FAIRLEY ([id number]) [id number] Page 6 of 13 Nursing Diagnoses:  Potential for falls Goals: Patient/caregiver will verbalize/demonstrate measures taken to prevent injury and/or falls Date Initiated:  01/09/2023 Target Resolution Date: 02/06/2023 Goal Status: Active Interventions: Assess fall risk on admission and as needed Assess: immobility, friction, shearing, incontinence upon admission and as needed Assess impairment of mobility on admission and as needed per policy Notes: Pressure Nursing Diagnoses: Knowledge deficit related to causes and risk factors for pressure ulcer development Knowledge deficit related to management of pressures ulcers Potential for impaired tissue integrity related to pressure, friction, moisture, and shear Goals: Patient/caregiver will verbalize understanding of pressure ulcer management Date Initiated: 01/09/2023 Target Resolution Date: 02/06/2023 Goal Status: Active Interventions: Assess: immobility, friction, shearing, incontinence upon admission and as needed Assess offloading mechanisms upon admission and as needed Assess potential for pressure ulcer upon admission and as needed Notes: Wound/Skin Impairment Nursing Diagnoses: Impaired tissue integrity Knowledge deficit related to ulceration/compromised skin integrity Goals: Patient/caregiver will verbalize understanding of skin care regimen Date Initiated: 01/09/2023 Target Resolution Date: 02/06/2023 Goal Status: Active Ulcer/skin breakdown will have a volume reduction of 30% by week 4 Date Initiated: 01/09/2023 Target Resolution Date: 02/06/2023 Goal Status: Active Interventions: Assess patient/caregiver ability to obtain necessary supplies Assess patient/caregiver ability to perform ulcer/skin care regimen upon admission and as needed Assess ulceration(s) every visit Provide education on ulcer and skin care Treatment Activities: Skin care regimen initiated : 01/09/2023 Topical wound management initiated : 01/09/2023 Notes: Electronic Signature(s) Signed: 01/09/2023 5:51:31 PM By: Zenaida Deed RN, BSN Entered By: Zenaida Deed on 01/09/2023  10:30:26 -------------------------------------------------------------------------------- Patient/Caregiver Education Details Patient Name: Date of Service: [name] 7/8/2024andnbsp9:00 A M Medical Record Number: [id number] Patient Account Number: [id number] SHUBH, REYNAUD ([id number]) [id number] Page 7 of 13 Date of Birth/Gender: Treating RN: 1945/09/02 (77 y.o. [name] Primary Care Physician: Vilinda Boehringer, DHA NA [facility] Other Clinician: Referring Physician: Treating Physician/Extender: Duanne Guess [name], [name] Education Assessment Education Provided To: Patient Education Topics Provided Pressure: Methods: Explain/Verbal Responses: Reinforcements needed, State content correctly Wound Debridement: Methods: Explain/Verbal Responses: Reinforcements needed, State content correctly Wound/Skin Impairment: Methods: Explain/Verbal Responses: Reinforcements needed, State content correctly Electronic Signature(s) Signed: 01/09/2023 5:51:31 PM By: Zenaida Deed RN, BSN Entered By: Zenaida Deed on 01/09/2023 16:48:07 -------------------------------------------------------------------------------- Wound Assessment Details Patient Name: Date of Service: Neil Pearson M M. 01/09/2023 9:00 A M Medical Record Number: [id number] Patient Account Number: [id number] Date of Birth/Sex: Treating RN: 09/17/1945 (77 y.o. [name] Primary Care Eligha Kmetz: Vilinda Boehringer, DHA NA [facility] Other Clinician: Referring Timofey Carandang: Treating Kyleigha Markert/Extender: Duanne Guess [name], [name] Wound Status Wound Number: 1 Primary Etiology: Open Surgical Wound Wound Location: Left, Lateral Lower Leg Wound Status: Open Wounding Event: Surgical Injury Comorbid History: Cataracts, Sleep Apnea, Hypertension, Osteoarthritis Date Acquired: 11/24/2022 Weeks Of Treatment: [name] Clustered Wound:  No Photos Wound Measurements Length: (cm) 3.6 Width: (cm) [name].9 Depth: (cm) [name].1 Area: (cm) 2.545 [name] ([id number]) Volume: (cm) [name].254 % Reduction in Area: % Reduction in Volume: Epithelialization: Small (1-33%) Tunneling: No [id number] Page 8 of 13 Undermining: No Wound Description Classification: Full Thickness Without Exposed Support Structures Wound Margin: Distinct, outline attached Exudate Amount: Medium Exudate Type: Serosanguineous Exudate Color: red, brown Foul Odor After Cleansing: No Slough/Fibrino Yes Wound Bed Granulation Amount: None Present ([name]%) Exposed Structure Necrotic Amount: Large (67-100%) Fascia Exposed: No Necrotic Quality: Adherent Slough Fat Layer (Subcutaneous Tissue) Exposed: Yes Tendon Exposed: No Muscle Exposed: No Joint Exposed: No Bone Exposed: No Periwound Skin Texture  Texture Color No Abnormalities Noted: Yes No Abnormalities Noted: Yes Moisture Temperature / Pain No Abnormalities Noted: Yes Temperature: No Abnormality Electronic Signature(s) Signed: 01/09/2023 5:51:31 PM By: Zenaida Deed RN, BSN Entered By: Zenaida Deed on 01/09/2023 10:18:35 -------------------------------------------------------------------------------- Wound Assessment Details Patient Name: Date of Service: Neil Pearson M M. 01/09/2023 9:00 A M Medical Record Number: [id number] Patient Account Number: [id number] Date of Birth/Sex: Treating RN: [birth year] (77 y.o. [name] Primary Care Fernanda Twaddell: Vilinda Boehringer, DHA NA [facility] Other Clinician: Referring Silus Lanzo: Treating Dakin Madani/Extender: Duanne Guess [name], [name] Wound Status Wound Number: 2 Primary Etiology: Pressure Ulcer Wound Location: Left, Posterior Lower Leg Wound Status: Open Wounding Event: Pressure Injury Comorbid History: Cataracts, Sleep Apnea, Hypertension, Osteoarthritis Date Acquired: 12/03/2022 Weeks Of  Treatment: [name] Clustered Wound: No Photos Wound Measurements Length: (cm) 3 Width: (cm) [name].5 Depth: (cm) [name].1 Area: (cm) 1.178 Volume: (cm) [name].118 [name] ([id number]) % Reduction in Area: % Reduction in Volume: Epithelialization: Small (1-33%) Tunneling: No Undermining: No [id number] Page 9 of 13 Wound Description Classification: Category/Stage III Wound Margin: Flat and Intact Exudate Amount: Medium Exudate Type: Serosanguineous Exudate Color: red, brown Foul Odor After Cleansing: No Slough/Fibrino Yes Wound Bed Granulation Amount: Small (1-33%) Exposed Structure Granulation Quality: Red Fascia Exposed: No Necrotic Amount: Large (67-100%) Fat Layer (Subcutaneous Tissue) Exposed: Yes Necrotic Quality: Adherent Slough Tendon Exposed: No Muscle Exposed: No Joint Exposed: No Bone Exposed: No Periwound Skin Texture Texture Color No Abnormalities Noted: Yes No Abnormalities Noted: Yes Moisture Temperature / Pain No Abnormalities Noted: Yes Temperature: No Abnormality Electronic Signature(s) Signed: 01/09/2023 5:51:31 PM By: Zenaida Deed RN, BSN Entered By: Zenaida Deed on 01/09/2023 10:19:02 -------------------------------------------------------------------------------- Wound Assessment Details Patient Name: Date of Service: Neil Pearson M M. 01/09/2023 9:00 A M Medical Record Number: [id number] Patient Account Number: [id number] Date of Birth/Sex: Treating RN: [birth year] (77 y.o. [name] Primary Care Dollene Mallery: Vilinda Boehringer, DHA NA [facility] Other Clinician: Referring Marques Ericson: Treating Rissa Turley/Extender: Duanne Guess [name], [name] Wound Status Wound Number: 3 Primary Etiology: Pressure Ulcer Wound Location: Left, Medial Ankle Wound Status: Open Wounding Event: Pressure Injury Comorbid History: Cataracts, Sleep Apnea, Hypertension, Osteoarthritis Date Acquired: 12/03/2022 Weeks Of  Treatment: [name] Clustered Wound: No Photos Wound Measurements Length: (cm) 1.4 Width: (cm) [name].5 Depth: (cm) [name].1 Area: (cm) [name].55 Volume: (cm) [name].055 WENCES, DELPOZO ([id number]) Wound Description Classification: Category/Stage III Wound Margin: Flat and Intact Exudate Amount: Small Exudate Type: Serosanguineous Exudate Color: red, brown Foul Odor After Cleansing: No Slough/Fibrino Yes % Reduction in Area: % Reduction in Volume: Epithelialization: Small (1-33%) Tunneling: No Undermining: No [id number] Page 10 of 13 Wound Bed Granulation Amount: Small (1-33%) Exposed Structure Granulation Quality: Pink Fascia Exposed: No Necrotic Amount: Large (67-100%) Fat Layer (Subcutaneous Tissue) Exposed: Yes Necrotic Quality: Adherent Slough Tendon Exposed: No Muscle Exposed: No Joint Exposed: No Bone Exposed: No Periwound Skin Texture Texture Color No Abnormalities Noted: Yes No Abnormalities Noted: Yes Moisture Temperature / Pain No Abnormalities Noted: Yes Temperature: No Abnormality Electronic Signature(s) Signed: 01/09/2023 5:51:31 PM By: Zenaida Deed RN, BSN Entered By: Zenaida Deed on 01/09/2023 10:19:25 -------------------------------------------------------------------------------- Wound Assessment Details Patient Name: Date of Service: Neil Pearson M M. 01/09/2023 9:00 A M Medical Record Number: [id number] Patient Account Number: [id number] Date of Birth/Sex: Treating RN: 1945-11-30 (77 y.o. [name] Primary Care Tiler Brandis: Vilinda Boehringer, [facility] NA [facility] Other Clinician: Referring Analyah Mcconnon: Treating Madisun Hargrove/Extender: Duanne Guess [name], [name]  Wound Status Wound Number: 4 Primary Etiology: Pressure Ulcer Wound Location: Left, Lateral Foot Wound Status: Open Wounding Event: Pressure Injury Comorbid History: Cataracts, Sleep Apnea, Hypertension, Osteoarthritis Date Acquired: 12/03/2022 Weeks Of  Treatment: [name] Clustered Wound: No Photos Wound Measurements Length: (cm) [name].8 Width: (cm) [name].9 Depth: (cm) [name].1 Area: (cm) [name].565 Volume: (cm) [name].057 BREVYN, ERSTAD ([id number]) Wound Description Classification: Category/Stage III Wound Margin: Flat and Intact Exudate Amount: Medium Exudate Type: Serosanguineous Exudate Color: red, brown Foul Odor After Cleansing: No Slough/Fibrino Yes % Reduction in Area: % Reduction in Volume: Epithelialization: Small (1-33%) Tunneling: No Undermining: No [id number] Page 11 of 13 Wound Bed Granulation Amount: Small (1-33%) Exposed Structure Granulation Quality: Red Fascia Exposed: No Necrotic Amount: Large (67-100%) Fat Layer (Subcutaneous Tissue) Exposed: Yes Necrotic Quality: Adherent Slough Tendon Exposed: No Muscle Exposed: No Joint Exposed: No Bone Exposed: No Periwound Skin Texture Texture Color No Abnormalities Noted: Yes No Abnormalities Noted: Yes Moisture Temperature / Pain No Abnormalities Noted: Yes Temperature: No Abnormality Tenderness on Palpation: Yes Electronic Signature(s) Signed: 01/09/2023 5:51:31 PM By: Zenaida Deed RN, BSN Entered By: Zenaida Deed on 01/09/2023 10:20:04 -------------------------------------------------------------------------------- Wound Assessment Details Patient Name: Date of Service: Neil Pearson M M. 01/09/2023 9:00 A M Medical Record Number: [id number] Patient Account Number: [id number] Date of Birth/Sex: Treating RN: Sep 15, 1945 (77 y.o. [name] Primary Care Kira Hartl: Vilinda Boehringer, DHA NA [facility] Other Clinician: Referring Tiaira Arambula: Treating Arvell Pulsifer/Extender: Duanne Guess [name], [name] Wound Status Wound Number: 5 Primary Etiology: Abrasion Wound Location: Left Knee Wound Status: Open Wounding Event: Trauma Comorbid History: Cataracts, Sleep Apnea, Hypertension, Osteoarthritis Date Acquired: 12/03/2022 Weeks  Of Treatment: [name] Clustered Wound: No Photos Wound Measurements Length: (cm) [name].5 Width: (cm) [name].5 Depth: (cm) [name].1 Area: (cm) [name].196 Volume: (cm) [name].02 KEVAUGHN, CUTBIRTH ([id number]) Wound Description Classification: Full Thickness Without Exposed Suppor Wound Margin: Flat and Intact Exudate Amount: Medium Exudate Type: Serosanguineous Exudate Color: red, brown t Structures Foul Odor After Cleansing: Slough/Fibrino % Reduction in Area: % Reduction in Volume: Epithelialization: Small (1-33%) Tunneling: No Undermining: No [id number] Page 12 of 13 No No Wound Bed Granulation Amount: Large (67-100%) Exposed Structure Granulation Quality: Red Fascia Exposed: No Necrotic Amount: None Present ([name]%) Fat Layer (Subcutaneous Tissue) Exposed: Yes Tendon Exposed: No Muscle Exposed: No Joint Exposed: No Bone Exposed: No Periwound Skin Texture Texture Color No Abnormalities Noted: Yes No Abnormalities Noted: Yes Moisture Temperature / Pain No Abnormalities Noted: Yes Temperature: No Abnormality Electronic Signature(s) Signed: 01/09/2023 5:51:31 PM By: Zenaida Deed RN, BSN Entered By: Zenaida Deed on 01/09/2023 10:20:40 -------------------------------------------------------------------------------- Wound Assessment Details Patient Name: Date of Service: Neil Pearson M M. 01/09/2023 9:00 A M Medical Record Number: [id number] Patient Account Number: [id number] Date of Birth/Sex: Treating RN: 09-06-1945 (77 y.o. [name] Primary Care Jahna Liebert: Vilinda Boehringer, DHA NA [facility] Other Clinician: Referring Keshun Berrett: Treating Bo Rogue/Extender: Duanne Guess [name], [name] Wound Status Wound Number: 6 Primary Etiology: Pressure Ulcer Wound Location: Right Calcaneus Wound Status: Open Wounding Event: Pressure Injury Comorbid History: Cataracts, Sleep Apnea, Hypertension, Osteoarthritis Date Acquired: 12/03/2022 Weeks Of  Treatment: [name] Clustered Wound: No Photos Wound Measurements Length: (cm) 2.6 Width: (cm) 3.5 Depth: (cm) [name].1 Area: (cm) 7.147 Volume: (cm) [name].715 JASE, VOLKOV ([id number]) Wound Description Classification: Unstageable/Unclassified Wound Margin: Flat and Intact Exudate Amount: Medium Exudate Type: Serosanguineous Exudate Color: red, brown Foul Odor After Cleansing: No Slough/Fibrino Yes % Reduction in Area: % Reduction in Volume: Epithelialization: Medium (34-66%)  Tunneling: No Undermining: No [id number] Page 13 of 13 Wound Bed Granulation Amount: Small (1-33%) Exposed Structure Granulation Quality: Red Fascia Exposed: No Necrotic Amount: Large (67-100%) Fat Layer (Subcutaneous Tissue) Exposed: Yes Necrotic Quality: Eschar, Adherent Slough Tendon Exposed: No Muscle Exposed: No Joint Exposed: No Bone Exposed: No Periwound Skin Texture Texture Color No Abnormalities Noted: Yes No Abnormalities Noted: Yes Moisture Temperature / Pain No Abnormalities Noted: No Temperature: No Abnormality Dry / Scaly: Yes Electronic Signature(s) Signed: 01/09/2023 5:51:31 PM By: Zenaida Deed RN, BSN Entered By: Zenaida Deed on 01/09/2023 10:21:14 -------------------------------------------------------------------------------- Vitals Details Patient Name: Date of Service: Neil Blalock MSO Rockne Coons M M. 01/09/2023 9:00 A M Medical Record Number: [id number] Patient Account Number: [id number] Date of Birth/Sex: Treating RN: [birth year] (77 y.o. M) Primary Care Avia Merkley: Vilinda Boehringer, DHA NA [facility] Other Clinician: Referring Jordin Vicencio: Treating Jermond Burkemper/Extender: Duanne Guess [name], [name] Vital Signs Time Taken: 09:13 Temperature (F): 98.[name] Height (in): 72 Pulse (bpm): 61 Source: Stated Respiratory Rate (breaths/min): 20 Weight (lbs): 240 Blood Pressure (mmHg): 119/61 Source: Stated Reference Range: 80 - 120 mg / dl Body Mass  Index (BMI): 32.5 Electronic Signature(s) Signed: 01/09/2023 5:51:31 PM By: Zenaida Deed RN, BSN Entered By: Zenaida Deed on 01/09/2023 09:38:03

## 2023-01-10 NOTE — Progress Notes (Signed)
[name], [name] ([id number]) [id number] Page 1 of 4 Visit Report for 01/09/2023 Abuse Risk Screen Details Patient Name: Date of Service: [facility] [name]. 01/09/2023 9:00 A [name] Medical Record Number: [id number] Patient Account Number: [id number] Date of Birth/Sex: Treating RN: 1945/09/08 (77 y.o. [name] Primary Care Viviann Broyles: Vilinda Boehringer, DHA NA [facility] Other Clinician: Referring Tabby Beaston: Treating Aleana Fifita/Extender: Duanne Guess Corrington, Kip Weeks in Treatment: 0 Abuse Risk Screen Items Answer ABUSE RISK SCREEN: Has anyone close to you tried to hurt or harm you recentlyo No Do you feel uncomfortable with anyone in your familyo No Has anyone forced you do things that you didnt want to doo No Electronic Signature(s) Signed: 01/09/2023 5:51:31 PM By: Zenaida Deed RN, BSN Entered By: Zenaida Deed on 01/09/2023 09:49:38 -------------------------------------------------------------------------------- Activities of Daily Living Details Patient Name: Date of Service: [facility] [name]. 01/09/2023 9:00 A [name] Medical Record Number: [id number] Patient Account Number: [id number] Date of Birth/Sex: Treating RN: September 04, 1945 (77 y.o. [name] Primary Care Eliud Polo: Vilinda Boehringer, DHA NA [facility] Other Clinician: Referring Estes Lehner: Treating Chanze Teagle/Extender: Duanne Guess Corrington, Kip Weeks in Treatment: 0 Activities of Daily Living Items Answer Activities of Daily Living (Please select one for each item) Drive Automobile Not Able T Medications ake Need Assistance Use T elephone Completely Able Care for Appearance Need Assistance Use T oilet Need Assistance Bath / Shower Need Assistance Dress Self Need Assistance Feed Self Completely Able Walk Not Able Get In / Out Bed Need Assistance Housework Not Able Prepare Meals Not Able Handle Money Need Assistance Shop for Self Not Able Electronic Signature(s) Signed:  01/09/2023 5:51:31 PM By: Zenaida Deed RN, BSN Entered By: Zenaida Deed on 01/09/2023 09:50:16 [name] ([id number]) [id number] Page 2 of 4 -------------------------------------------------------------------------------- Education Screening Details Patient Name: Date of Service: [facility] MSO [name]. 01/09/2023 9:00 A [name] Medical Record Number: [id number] Patient Account Number: [id number] Date of Birth/Sex: Treating RN: Jan 22, 1946 (77 y.o. [name] Primary Care Chloeann Alfred: Vilinda Boehringer, DHA NA [facility] Other Clinician: Referring Nic Lampe: Treating Desmon Hitchner/Extender: Duanne Guess Corrington, Kip Weeks in Treatment: 0 Primary Learner Assessed: Patient Learning Preferences/Education Level/Primary Language Learning Preference: Explanation, Demonstration, Printed Material Highest Education Level: High School Preferred Language: English Cognitive Barrier Language Barrier: No Translator Needed: No Memory Deficit: No Emotional Barrier: No Cultural/Religious Beliefs Affecting Medical Care: No Physical Barrier Impaired Vision: No Impaired Hearing: No Decreased Hand dexterity: No Knowledge/Comprehension Knowledge Level: High Comprehension Level: High Ability to understand written instructions: High Ability to understand verbal instructions: High Motivation Anxiety Level: Calm Cooperation: Cooperative Education Importance: Acknowledges Need Interest in Health Problems: Asks Questions Perception: Coherent Willingness to Engage in Self-Management High Activities: Readiness to Engage in Self-Management High Activities: Electronic Signature(s) Signed: 01/09/2023 5:51:31 PM By: Zenaida Deed RN, BSN Entered By: Zenaida Deed on 01/09/2023 09:51:02 -------------------------------------------------------------------------------- Fall Risk Assessment Details Patient Name: Date of Service: Orlinda Blalock MSO [name]. 01/09/2023 9:00 A  [name] Medical Record Number: [id number] Patient Account Number: [id number] Date of Birth/Sex: Treating RN: Nov 03, 1945 (77 y.o. [name] Primary Care Janari Gagner: Vilinda Boehringer, DHA NA [facility] Other Clinician: Referring Vilma Will: Treating Prosperity Darrough/Extender: Duanne Guess Corrington, Kip Weeks in Treatment: 0 Fall Risk Assessment Items Have you had 2 or more falls in the last [address] [name], [name] ([id number]) [id number] Page 3 of 4 Have you had any fall that resulted in injury in the last 12 monthso 0 Yes FALLS RISK SCREEN History of  falling - immediate or within 3 months 25 Yes Secondary diagnosis (Do you have 2 or more medical diagnoseso) 15 Yes Ambulatory aid None/bed rest/wheelchair/nurse 0 Yes Crutches/cane/walker 0 No Furniture 0 No Intravenous therapy Access/Saline/Heparin Lock 0 No Gait/Transferring Normal/ bed rest/ wheelchair 0 No Weak (short steps with or without shuffle, stooped but able to lift head while walking, may seek 0 No support from furniture) Impaired (short steps with shuffle, may have difficulty arising from chair, head down, impaired 20 Yes balance) Mental Status Oriented to own ability 0 Yes Electronic Signature(s) Signed: 01/09/2023 5:51:31 PM By: Zenaida Deed RN, BSN Entered By: Zenaida Deed on 01/09/2023 09:51:25 -------------------------------------------------------------------------------- Foot Assessment Details Patient Name: Date of Service: [name]. 01/09/2023 9:00 A [name] Medical Record Number: [id number] Patient Account Number: [id number] Date of Birth/Sex: Treating RN: July 09, 1945 (77 y.o. [name] Primary Care Andreea Arca: Vilinda Boehringer, DHA NA [facility] Other Clinician: Referring Nilay Mangrum: Treating Londan Coplen/Extender: Duanne Guess Corrington, Kip Weeks in Treatment: 0 Foot Assessment Items Site Locations + = Sensation present, - = Sensation absent, C = Callus, U = Ulcer R =  Redness, W = Warmth, [name] = Maceration, PU = Pre-ulcerative lesion F = Fissure, S = Swelling, D = Dryness Assessment Right: Left: Other Deformity: No No Prior Foot Ulcer: No No Prior Amputation: No No Charcot Joint: No No Ambulatory Status: Non-ambulatory Assistance Device: Wheelchair [name], [name] Prescott ([id number]) [id number] Page 4 of 4 Gait: Electronic Signature(s) Signed: 01/09/2023 5:51:31 PM By: Zenaida Deed RN, BSN Entered By: Zenaida Deed on 01/09/2023 09:54:29 -------------------------------------------------------------------------------- Nutrition Risk Screening Details Patient Name: Date of Service: [facility] [name]. 01/09/2023 9:00 A [name] Medical Record Number: [id number] Patient Account Number: [id number] Date of Birth/Sex: Treating RN: 1946-02-07 (77 y.o. [name] Primary Care Rocky Gladden: Vilinda Boehringer, DHA NA [facility] Other Clinician: Referring Salena Ortlieb: Treating Chosen Geske/Extender: Duanne Guess Corrington, Kip Weeks in Treatment: 0 Height (in): 72 Weight (lbs): 240 Body Mass Index (BMI): 32.5 Nutrition Risk Screening Items Score Screening NUTRITION RISK SCREEN: I have an illness or condition that made me change the kind and/or amount of food I eat 0 No I eat fewer than two meals per day 0 No I eat few fruits and vegetables, or milk products 0 No I have three or more drinks of beer, liquor or wine almost every day 0 No I have tooth or mouth problems that make it hard for me to eat 0 No I don't always have enough money to buy the food I need 0 No I eat alone most of the time 0 No I take three or more different prescribed or over-the-counter drugs a day 1 Yes Without wanting to, I have lost or gained 10 pounds in the last six months 0 No I am not always physically able to shop, cook and/or feed myself 2 Yes Nutrition Protocols Good Risk Protocol Moderate Risk Protocol 0 Provide education on nutrition High Risk  Proctocol Risk Level: Moderate Risk Score: 3 Electronic Signature(s) Signed: 01/09/2023 5:51:31 PM By: Zenaida Deed RN, BSN Entered By: Zenaida Deed on 01/09/2023 09:51:55

## 2023-01-11 ENCOUNTER — Encounter (HOSPITAL_BASED_OUTPATIENT_CLINIC_OR_DEPARTMENT_OTHER): Payer: Medicare Other | Admitting: Physical Therapy

## 2023-01-16 ENCOUNTER — Ambulatory Visit (HOSPITAL_BASED_OUTPATIENT_CLINIC_OR_DEPARTMENT_OTHER): Payer: Medicare Other | Admitting: Physical Therapy

## 2023-01-17 ENCOUNTER — Encounter (HOSPITAL_BASED_OUTPATIENT_CLINIC_OR_DEPARTMENT_OTHER): Payer: Medicare Other | Attending: General Surgery | Admitting: General Surgery

## 2023-01-17 DIAGNOSIS — L89623 Pressure ulcer of left heel, stage 3: Secondary | ICD-10-CM | POA: Insufficient documentation

## 2023-01-17 DIAGNOSIS — G629 Polyneuropathy, unspecified: Secondary | ICD-10-CM | POA: Diagnosis not present

## 2023-01-17 DIAGNOSIS — L89523 Pressure ulcer of left ankle, stage 3: Secondary | ICD-10-CM | POA: Insufficient documentation

## 2023-01-17 DIAGNOSIS — L97322 Non-pressure chronic ulcer of left ankle with fat layer exposed: Secondary | ICD-10-CM | POA: Insufficient documentation

## 2023-01-17 DIAGNOSIS — L89613 Pressure ulcer of right heel, stage 3: Secondary | ICD-10-CM | POA: Insufficient documentation

## 2023-01-17 DIAGNOSIS — I10 Essential (primary) hypertension: Secondary | ICD-10-CM | POA: Insufficient documentation

## 2023-01-17 NOTE — Progress Notes (Signed)
GAHEL, SAFLEY ([id number]) [id number] Page 1 of 15 Visit Report for 01/17/2023 Arrival Information Details Patient Name: Date of Service: [facility] [name]. 01/17/2023 1:00 PM Medical Record Number: [id number] Patient Account Number: [id number] Date of Birth/Sex: Treating [name]: 01-17-1946 (77 y.o. [name] Primary Care [name]: [name], DHA NA [facility] Other Clinician: Referring [name]: Treating [name]/Extender: [name] Weeks in [name]: 1 Visit Information History Since Last Visit Added or deleted any medications: No Patient Arrived: Wheel Chair Any new allergies or adverse reactions: No Arrival Time: 12:47 Had a fall or experienced change in No Accompanied By: spouse activities of daily living that may affect Transfer Assistance: Manual risk of falls: Patient Identification Verified: Yes Signs or symptoms of abuse/neglect since last visito No Patient Requires Transmission-Based Precautions: No Hospitalized since last visit: No Patient Has Alerts: No Implantable device outside of the clinic excluding No cellular tissue based products placed in the center since last visit: Has Dressing in Place as Prescribed: Yes Has Compression in Place as Prescribed: Yes Pain Present Now: No Electronic Signature(s) Signed: 01/17/2023 4:25:31 PM By: [name] Entered By: Neil Schwalbe on 01/17/2023 12:47:36 -------------------------------------------------------------------------------- Encounter Discharge Information Details Patient Name: Date of Service: Neil Blalock [name] Rockne Coons M M. 01/17/2023 1:00 PM Medical Record Number: [id number] Patient Account Number: [id number] Date of Birth/Sex: Treating [name]: 1946-01-21 (77 y.o. [name] Primary Care Sondi Desch: [name], DHA NA [facility] Other Clinician: Referring Braxen Dobek: Treating Darlys Buis/Extender: [name] Weeks in [name]:  1 Encounter Discharge Information Items Post Procedure Vitals Discharge Condition: Stable Temperature (F): 98.6 Ambulatory Status: Wheelchair Pulse (bpm): 60 Discharge Destination: Home Respiratory Rate (breaths/min): 18 Transportation: Private Auto Blood Pressure (mmHg): 124/66 Accompanied By: spouse Schedule Follow-up Appointment: Yes Clinical Summary of Care: Patient Declined Electronic Signature(s) Signed: 01/17/2023 4:25:31 PM By: [name] Entered By: Neil Schwalbe on 01/17/2023 16:15:31 [name] ([id number]) [id number] Page 2 of 15 -------------------------------------------------------------------------------- Lower Extremity Assessment Details Patient Name: Date of Service: [name] [name]. 01/17/2023 1:00 PM Medical Record Number: [id number] Patient Account Number: [id number] Date of Birth/Sex: Treating [name]: 1946-03-27 (77 y.o. [name] Primary Care [name]: [name], DHA NA [name] Other Clinician: Referring Aziah Kaiser: Treating Brenda Samano/Extender: [name] Weeks in [name]: 1 Edema Assessment Assessed: [Left: No] [Right: No] Edema: [Left: Yes] [Right: Yes] Calf Left: Right: Point of Measurement: From Medial Instep 38.5 cm 39.5 cm Ankle Left: Right: Point of Measurement: From Medial Instep 28.1 cm 27 cm Vascular Assessment Pulses: Dorsalis Pedis Palpable: [Left:Yes] [Right:Yes] Extremity colors, hair growth, and conditions: Extremity Color: [Left:Normal] [Right:Normal] Temperature of Extremity: [Left:Warm] [Right:Warm] Capillary Refill: [Left:< 3 seconds] [Right:< 3 seconds] Dependent Rubor: [Left:No] [Right:No] Blanched when Elevated: [Left:No No] [Right:No No] Toe Nail Assessment Left: Right: Thick: Yes Yes Discolored: Yes No Deformed: No No Improper Length and Hygiene: No No Electronic Signature(s) Signed: 01/17/2023 4:25:31 PM By: [name] Entered  By: Neil Schwalbe on 01/17/2023 12:56:21 -------------------------------------------------------------------------------- Multi Wound Chart Details Patient Name: Date of Service: Neil Blalock [name] Rockne Coons M M. 01/17/2023 1:00 PM Medical Record Number: [id number] Patient Account Number: [id number] Date of Birth/Sex: Treating [name]: 1945-10-18 (77 y.o. M) Primary Care Shylee Durrett: [name], DHA NA [facility] [name] Other Clinician: Referring Harlean Regula: Treating Antavius Sperbeck/Extender: [name] Weeks in [name]: 1 Vital Signs Height(in): 72 Pulse(bpm): 60 Weight(lbs): 240 Blood Pressure(mmHg): 124/66 Body Mass Index(BMI): 32.5 [name]  [name] ([id number]) [id number] Page 3 of 15 Temperature(F): 98.6 Respiratory Rate(breaths/min): 18 [1:Photos:] Left, Lateral Lower Leg Left, Posterior Lower Leg Left, Medial Ankle Wound Location: Surgical Injury Pressure Injury Pressure Injury Wounding Event: Open Surgical Wound Pressure Ulcer Pressure Ulcer Primary Etiology: Cataracts, Sleep Apnea, Hypertension, Cataracts, Sleep Apnea, Hypertension, Cataracts, Sleep Apnea, Hypertension, Comorbid History: Osteoarthritis Osteoarthritis Osteoarthritis 11/24/2022 12/03/2022 12/03/2022 Date Acquired: 1 1 1  Weeks of [name]: Open Open Open Wound Status: No No No Wound Recurrence: 3.4x0.8x0.1 4x1x0.1 1.3x0.5x0.1 Measurements L x W x D (cm) 2.136 3.142 0.511 A (cm) : rea 0.214 0.314 0.051 Volume (cm) : 16.10% -166.70% 7.10% % Reduction in A rea: 15.70% -166.10% 7.30% % Reduction in Volume: Full Thickness Without Exposed Category/Stage III Category/Stage III Classification: Support Structures Medium Medium Small Exudate A mount: Serosanguineous Serosanguineous Serosanguineous Exudate Type: red, brown red, brown red, brown Exudate Color: Distinct, outline attached Flat and Intact Flat and Intact Wound Margin: Large (67-100%) Small (1-33%) Small  (1-33%) Granulation A mount: Pink Red, Pink Pink Granulation Quality: Small (1-33%) Large (67-100%) Large (67-100%) Necrotic A mount: Eschar, Adherent Slough Adherent [employer] Necrotic Tissue: Fat Layer (Subcutaneous Tissue): Yes Fat Layer (Subcutaneous Tissue): Yes Fat Layer (Subcutaneous Tissue): Yes Exposed Structures: Fascia: No Fascia: No Fascia: No Tendon: No Tendon: No Tendon: No Muscle: No Muscle: No Muscle: No Joint: No Joint: No Joint: No Bone: No Bone: No Bone: No Small (1-33%) Small (1-33%) Small (1-33%) Epithelialization: Debridement - Excisional Debridement - Selective/Open Wound Debridement - Excisional Debridement: Pre-procedure Verification/Time Out 13:17 13:17 13:17 Taken: Lidocaine 5% topical ointment Lidocaine 5% topical ointment Lidocaine 5% topical ointment Pain Control: Subcutaneous, Ambulance person, [employer] Tissue Debrided: Skin/Subcutaneous Tissue Non-Viable Tissue Skin/Subcutaneous Tissue Level: 2.14 3.14 0.51 Debridement A (sq cm): rea Curette Curette Curette Instrument: Minimum Minimum Minimum Bleeding: Pressure Pressure Pressure Hemostasis A chieved: 0 0 0 Procedural Pain: 0 0 0 Post Procedural Pain: Procedure was tolerated well Procedure was tolerated well Procedure was tolerated well Debridement [name] Response: 3.4x0.8x0.1 4x1x0.1 1.3x0.5x0.1 Post Debridement Measurements L x W x D (cm) 0.214 0.314 0.051 Post Debridement Volume: (cm) N/A Category/Stage III Category/Stage III Post Debridement Stage: No Abnormalities Noted No Abnormalities Noted No Abnormalities Noted Periwound Skin Texture: No Abnormalities Noted No Abnormalities Noted No Abnormalities Noted Periwound Skin Moisture: No Abnormalities Noted No Abnormalities Noted No Abnormalities Noted Periwound Skin Color: No Abnormality No Abnormality No Abnormality Temperature: Debridement Debridement Debridement Procedures  Performed: Wound Number: 4 5 6  Photos: Left, Lateral Foot Left Knee Right Calcaneus Wound Location: Pressure Injury Trauma Pressure Injury Wounding Event: Pressure Ulcer Abrasion Pressure Ulcer Primary Etiology: JOBY, RICHART ([id number]) [id number] Page 4 of 15 Cataracts, Sleep Apnea, Hypertension, Cataracts, Sleep Apnea, Hypertension, Cataracts, Sleep Apnea, Hypertension, Comorbid History: Osteoarthritis Osteoarthritis Osteoarthritis 12/03/2022 12/03/2022 12/03/2022 Date Acquired: 1 1 1  Weeks of [name]: Open Healed - Epithelialized Open Wound Status: No No No Wound Recurrence: 0.8x0.9x0.1 0x0x0 1.8x2.4x0.1 Measurements L x W x D (cm) 0.565 0 3.393 A (cm) : rea 0.057 0 0.339 Volume (cm) : 0.00% 100.00% 52.50% % Reduction in A rea: 0.00% 100.00% 52.60% % Reduction in Volume: Category/Stage III Full Thickness Without Exposed Unstageable/Unclassified Classification: Support Structures Medium None Present Medium Exudate A mount: Serosanguineous N/A Serosanguineous Exudate Type: red, brown N/A red, brown Exudate Color: Flat and Intact Flat and Intact Flat and Intact Wound Margin: Small (1-33%) None Present (0%) Medium (34-66%) Granulation A mount: Red, Pink N/A Red Granulation Quality: Large (67-100%) None Present (0%) Medium (34-66%) Necrotic A  mount: Eschar, Adherent Slough N/A Eschar, Adherent Slough Necrotic Tissue: Fat Layer (Subcutaneous Tissue): Yes Fascia: No Fat Layer (Subcutaneous Tissue): Yes Exposed Structures: Fascia: No Fat Layer (Subcutaneous Tissue): No Fascia: No Tendon: No Tendon: No Tendon: No Muscle: No Muscle: No Muscle: No Joint: No Joint: No Joint: No Bone: No Bone: No Bone: No Small (1-33%) Small (1-33%) Medium (34-66%) Epithelialization: Debridement - Selective/Open Wound N/A Debridement - Excisional Debridement: Pre-procedure Verification/Time Out 13:17 N/A 13:17 Taken: Lidocaine 5% topical  ointment N/A Lidocaine 5% topical ointment Pain Control: Necrotic/Eschar, Slough N/A Subcutaneous, Slough Tissue Debrided: Non-Viable Tissue N/A Skin/Subcutaneous Tissue Level: 0.57 N/A 3.39 Debridement A (sq cm): rea Curette N/A Curette Instrument: Minimum N/A Minimum Bleeding: Pressure N/A Pressure Hemostasis A chieved: 0 N/A 0 Procedural Pain: 0 N/A 0 Post Procedural Pain: Procedure was tolerated well N/A Procedure was tolerated well Debridement [name] Response: 0.8x0.9x0.1 N/A 1.8x2.4x0.1 Post Debridement Measurements L x W x D (cm) 0.057 N/A 0.339 Post Debridement Volume: (cm) Category/Stage III N/A Unstageable/Unclassified Post Debridement Stage: No Abnormalities Noted No Abnormalities Noted Callus: Yes Periwound Skin Texture: No Abnormalities Noted No Abnormalities Noted Dry/Scaly: Yes Periwound Skin Moisture: No Abnormalities Noted No Abnormalities Noted No Abnormalities Noted Periwound Skin Color: No Abnormality No Abnormality No Abnormality Temperature: Yes N/A N/A Tenderness on Palpation: Debridement N/A Debridement Procedures Performed: [name] Notes Electronic Signature(s) Signed: 01/17/2023 2:06:12 PM By: Duanne Guess MD FACS Entered By: Duanne Guess on 01/17/2023 14:06:12 -------------------------------------------------------------------------------- Multi-Disciplinary Care Plan Details Patient Name: Date of Service: Neil Blalock [name] Rockne Coons M M. 01/17/2023 1:00 PM Medical Record Number: [id number] Patient Account Number: [id number] Date of Birth/Sex: Treating [name]: January 21, 1946 (77 y.o. [name] Primary Care Brecken Dewoody: [name], [facility] NA [facility] Other Clinician: Referring Karter Haire: Treating Kyuss Hale/Extender: [name] Weeks in [name]: 1 Multidisciplinary Care Plan reviewed with physician Active Inactive MANDEL, SEIDEN ([id number]) [id number] Page 5 of 15 Abuse / Safety /  Falls / Self Care Management Nursing Diagnoses: Potential for falls Goals: Patient/caregiver will verbalize/demonstrate measures taken to prevent injury and/or falls Date Initiated: 01/09/2023 Target Resolution Date: 02/06/2023 Goal Status: Active Interventions: Assess fall risk on admission and as needed Assess: immobility, friction, shearing, incontinence upon admission and as needed Assess impairment of mobility on admission and as needed per policy Notes: Pressure Nursing Diagnoses: Knowledge deficit related to causes and risk factors for pressure ulcer development Knowledge deficit related to management of pressures ulcers Potential for impaired tissue integrity related to pressure, friction, moisture, and shear Goals: Patient/caregiver will verbalize understanding of pressure ulcer management Date Initiated: 01/09/2023 Target Resolution Date: 02/06/2023 Goal Status: Active Interventions: Assess: immobility, friction, shearing, incontinence upon admission and as needed Assess offloading mechanisms upon admission and as needed Assess potential for pressure ulcer upon admission and as needed Notes: Wound/Skin Impairment Nursing Diagnoses: Impaired tissue integrity Knowledge deficit related to ulceration/compromised skin integrity Goals: Patient/caregiver will verbalize understanding of skin care regimen Date Initiated: 01/09/2023 Target Resolution Date: 02/06/2023 Goal Status: Active Ulcer/skin breakdown will have a volume reduction of 30% by week 4 Date Initiated: 01/09/2023 Target Resolution Date: 02/06/2023 Goal Status: Active Interventions: Assess patient/caregiver ability to obtain necessary supplies Assess patient/caregiver ability to perform ulcer/skin care regimen upon admission and as needed Assess ulceration(s) every visit Provide education on ulcer and skin care [name] Activities: Skin care regimen initiated : 01/09/2023 Topical wound management initiated :  01/09/2023 Notes: Electronic Signature(s) Signed: 01/17/2023 4:25:31 PM By: [name] Entered By: Neil Schwalbe on 01/17/2023 16:13:16 Pain  Assessment Details -------------------------------------------------------------------------------- [name] ([id number]) [id number] Page 6 of 15 Patient Name: Date of Service: [name] [name]. 01/17/2023 1:00 PM Medical Record Number: [id number] Patient Account Number: [id number] Date of Birth/Sex: Treating [name]: 03-10-1946 (77 y.o. [name] Primary Care Tyresa Prindiville: [name], DHA NA [facility] Other Clinician: Referring Mailey Landstrom: Treating Marigrace Mccole/Extender: [name] Weeks in [name]: 1 Active Problems Location of Pain Severity and Description of Pain Patient Has Paino No Site Locations Pain Management and Medication Current Pain Management: Electronic Signature(s) Signed: 01/17/2023 4:25:31 PM By: [name] Entered By: Neil Schwalbe on 01/17/2023 12:48:12 -------------------------------------------------------------------------------- Patient/Caregiver Education Details Patient Name: Date of Service: [name] 7/16/2024andnbsp1:00 PM Medical Record Number: [id number] Patient Account Number: [id number] Date of Birth/Gender: Treating [name]: [birth year] (77 y.o. [name] Primary Care Physician: [name], DHA NA [facility] Other Clinician: Referring Physician: Treating Physician/Extender: [name] Weeks in [name]: 1 Education Assessment Education Provided To: Patient Education Topics Provided Wound/Skin Impairment: Methods: Explain/Verbal Responses: Return demonstration correctly Electronic Signature(s) Signed: 01/17/2023 4:25:31 PM By: [name] Entered By: Neil Schwalbe on 01/17/2023 16:13:31 [name] ([id number]) [id number] Page 7 of  15 -------------------------------------------------------------------------------- Wound Assessment Details Patient Name: Date of Service: [name] [name]. 01/17/2023 1:00 PM Medical Record Number: [id number] Patient Account Number: [id number] Date of Birth/Sex: Treating [name]: 1945-11-18 (77 y.o. [name] Primary Care Jonni Oelkers: [name], DHA NA [name] Other Clinician: Referring Amritha Yorke: Treating Shevaun Lovan/Extender: [name] Weeks in [name]: 1 Wound Status Wound Number: 1 Primary Etiology: Open Surgical Wound Wound Location: Left, Lateral Lower Leg Wound Status: Open Wounding Event: Surgical Injury Comorbid History: Cataracts, Sleep Apnea, Hypertension, Osteoarthritis Date Acquired: 11/24/2022 Weeks Of [name]: 1 Clustered Wound: No Photos Wound Measurements Length: (cm) 3.4 Width: (cm) 0.8 Depth: (cm) 0.1 Area: (cm) 2.136 Volume: (cm) 0.214 % Reduction in Area: 16.1% % Reduction in Volume: 15.7% Epithelialization: Small (1-33%) Tunneling: No Undermining: No Wound Description Classification: Full Thickness Without Exposed Suppor Wound Margin: Distinct, outline attached Exudate Amount: Medium Exudate Type: Serosanguineous Exudate Color: red, brown t Structures Foul Odor After Cleansing: No Slough/Fibrino Yes Wound Bed Granulation Amount: Large (67-100%) Exposed Structure Granulation Quality: Pink Fascia Exposed: No Necrotic Amount: Small (1-33%) Fat Layer (Subcutaneous Tissue) Exposed: Yes Necrotic Quality: Eschar, Adherent Slough Tendon Exposed: No Muscle Exposed: No Joint Exposed: No Bone Exposed: No Periwound Skin Texture Texture Color No Abnormalities Noted: Yes No Abnormalities Noted: Yes Moisture Temperature / Pain No Abnormalities Noted: Yes Temperature: No Abnormality [name] Notes Wound #1 (Lower Leg) Wound Laterality: Left, Lateral Cleanser Peri-Wound Care HERMEN, MARIO ([id number])  [id number] Page 8 of 15 Topical Primary Dressing Promogran Prisma Matrix, 4.34 (sq in) (silver collagen) Discharge Instruction: Moisten collagen with saline or hydrogel Secondary Dressing Woven Gauze Sponge, Non-Sterile 4x4 in Discharge Instruction: Apply over primary dressing as directed. Secured With Elastic Bandage 4 inch (ACE bandage) Discharge Instruction: Secure with ACE bandage as directed. Kerlix Roll Sterile, 4.5x3.1 (in/yd) Discharge Instruction: Secure with Kerlix as directed. Compression Wrap Compression Stockings Add-Ons Electronic Signature(s) Signed: 01/17/2023 4:25:31 PM By: [name] Entered By: Neil Schwalbe on 01/17/2023 13:01:44 -------------------------------------------------------------------------------- Wound Assessment Details Patient Name: Date of Service: Neil Pearson M M. 01/17/2023 1:00 PM Medical Record Number: [id number] Patient Account Number: [id number] Date of Birth/Sex: Treating [name]: 1946-06-25 (77 y.o. [name] Primary Care Juliani Laduke: [name], [facility]  NA [name] Other Clinician: Referring Lukisha Procida: Treating Camia Dipinto/Extender: [name] Weeks in [name]: 1 Wound Status Wound Number: 2 Primary Etiology: Pressure Ulcer Wound Location: Left, Posterior Lower Leg Wound Status: Open Wounding Event: Pressure Injury Comorbid History: Cataracts, Sleep Apnea, Hypertension, Osteoarthritis Date Acquired: 12/03/2022 Weeks Of [name]: 1 Clustered Wound: No Photos Wound Measurements Length: (cm) 4 Width: (cm) 1 Depth: (cm) 0.1 Area: (cm) 3.142 Volume: (cm) 0.314 % Reduction in Area: -166.7% % Reduction in Volume: -166.1% Epithelialization: Small (1-33%) Tunneling: No Undermining: No Wound Description Classification: Category/Stage III KENTA, LASTER ([id number]) Wound Margin: Flat and Intact Exudate Amount: Medium Exudate Type: Serosanguineous Exudate Color:  red, brown Foul Odor After Cleansing: No [id number] Page 9 of 15 Slough/Fibrino Yes Wound Bed Granulation Amount: Small (1-33%) Exposed Structure Granulation Quality: Red, Pink Fascia Exposed: No Necrotic Amount: Large (67-100%) Fat Layer (Subcutaneous Tissue) Exposed: Yes Necrotic Quality: Adherent Slough Tendon Exposed: No Muscle Exposed: No Joint Exposed: No Bone Exposed: No Periwound Skin Texture Texture Color No Abnormalities Noted: Yes No Abnormalities Noted: Yes Moisture Temperature / Pain No Abnormalities Noted: Yes Temperature: No Abnormality [name] Notes Wound #2 (Lower Leg) Wound Laterality: Left, Posterior Cleanser Peri-Wound Care Topical Primary Dressing Hydrofera Blue Ready Transfer Foam, 2.5x2.5 (in/in) Discharge Instruction: Apply directly to wound bed as directed Santyl Ointment Discharge Instruction: Apply nickel thick amount to wound bed as instructed Secondary Dressing Woven Gauze Sponge, Non-Sterile 4x4 in Discharge Instruction: Apply over primary dressing as directed. Secured With Elastic Bandage 4 inch (ACE bandage) Discharge Instruction: Secure with ACE bandage as directed. Kerlix Roll Sterile, 4.5x3.1 (in/yd) Discharge Instruction: Secure with Kerlix as directed. Compression Wrap Compression Stockings Add-Ons Electronic Signature(s) Signed: 01/17/2023 4:25:31 PM By: [name] Entered By: Neil Schwalbe on 01/17/2023 13:02:25 -------------------------------------------------------------------------------- Wound Assessment Details Patient Name: Date of Service: Neil Pearson M M. 01/17/2023 1:00 PM Medical Record Number: [id number] Patient Account Number: [id number] Date of Birth/Sex: Treating [name]: [birth year] (77 y.o. [name] Primary Care Beauford Lando: [name], DHA NA [facility] [name] Other Clinician: Referring Bricia Taher: Treating Rola Lennon/Extender: [name] Weeks in  [name]: 1 Wound Status Wound Number: 3 Primary Etiology: Pressure Ulcer [name], [name] ([id number]) [id number] Page 10 of 15 Wound Location: Left, Medial Ankle Wound Status: Open Wounding Event: Pressure Injury Comorbid History: Cataracts, Sleep Apnea, Hypertension, Osteoarthritis Date Acquired: 12/03/2022 Weeks Of [name]: 1 Clustered Wound: No Photos Wound Measurements Length: (cm) 1.3 Width: (cm) 0.5 Depth: (cm) 0.1 Area: (cm) 0.511 Volume: (cm) 0.051 % Reduction in Area: 7.1% % Reduction in Volume: 7.3% Epithelialization: Small (1-33%) Tunneling: No Undermining: No Wound Description Classification: Category/Stage III Wound Margin: Flat and Intact Exudate Amount: Small Exudate Type: Serosanguineous Exudate Color: red, brown Foul Odor After Cleansing: No Slough/Fibrino Yes Wound Bed Granulation Amount: Small (1-33%) Exposed Structure Granulation Quality: Pink Fascia Exposed: No Necrotic Amount: Large (67-100%) Fat Layer (Subcutaneous Tissue) Exposed: Yes Necrotic Quality: Adherent Slough Tendon Exposed: No Muscle Exposed: No Joint Exposed: No Bone Exposed: No Periwound Skin Texture Texture Color No Abnormalities Noted: Yes No Abnormalities Noted: Yes Moisture Temperature / Pain No Abnormalities Noted: Yes Temperature: No Abnormality [name] Notes Wound #3 (Ankle) Wound Laterality: Left, Medial Cleanser Peri-Wound Care Topical Primary Dressing Promogran Prisma Matrix, 4.34 (sq in) (silver collagen) Discharge Instruction: Moisten collagen with saline or hydrogel Secondary Dressing Woven Gauze Sponge, Non-Sterile 4x4 in Discharge Instruction: Apply over primary dressing as directed. Secured With Elastic Bandage 4 inch (ACE bandage) Discharge Instruction: Secure  with ACE bandage as directed. Kerlix Roll Sterile, 4.5x3.1 (in/yd) Discharge Instruction: Secure with Kerlix as directed. [name], [name] ([id number])  [id number] Page 11 of 15 Compression Wrap Compression Stockings Add-Ons Electronic Signature(s) Signed: 01/17/2023 4:25:31 PM By: [name] Entered By: Neil Schwalbe on 01/17/2023 13:03:00 -------------------------------------------------------------------------------- Wound Assessment Details Patient Name: Date of Service: Neil Pearson M M. 01/17/2023 1:00 PM Medical Record Number: [id number] Patient Account Number: [id number] Date of Birth/Sex: Treating [name]: Oct 12, 1945 (77 y.o. [name] Primary Care Ringo Sherod: [name], DHA NA [facility] Other Clinician: Referring Yitzhak Awan: Treating Annamaria Salah/Extender: [name] Weeks in [name]: 1 Wound Status Wound Number: 4 Primary Etiology: Pressure Ulcer Wound Location: Left, Lateral Foot Wound Status: Open Wounding Event: Pressure Injury Comorbid History: Cataracts, Sleep Apnea, Hypertension, Osteoarthritis Date Acquired: 12/03/2022 Weeks Of [name]: 1 Clustered Wound: No Photos Wound Measurements Length: (cm) 0.8 Width: (cm) 0.9 Depth: (cm) 0.1 Area: (cm) 0.565 Volume: (cm) 0.057 % Reduction in Area: 0% % Reduction in Volume: 0% Epithelialization: Small (1-33%) Tunneling: No Undermining: No Wound Description Classification: Category/Stage III Wound Margin: Flat and Intact Exudate Amount: Medium Exudate Type: Serosanguineous Exudate Color: red, brown Foul Odor After Cleansing: No Slough/Fibrino Yes Wound Bed Granulation Amount: Small (1-33%) Exposed Structure Granulation Quality: Red, Pink Fascia Exposed: No Necrotic Amount: Large (67-100%) Fat Layer (Subcutaneous Tissue) Exposed: Yes Necrotic Quality: Eschar, Adherent Slough Tendon Exposed: No Muscle Exposed: No Joint Exposed: No Bone Exposed: No [id number] SAMIL, [name] ([id number]) [id number] Page 12 of 15 No Abnormalities Noted:  Yes No Abnormalities Noted: Yes Moisture Temperature / Pain No Abnormalities Noted: Yes Temperature: No Abnormality Tenderness on Palpation: Yes [name] Notes Wound #4 (Foot) Wound Laterality: Left, Lateral Cleanser Peri-Wound Care Topical Primary Dressing Promogran Prisma Matrix, 4.34 (sq in) (silver collagen) Discharge Instruction: Moisten collagen with saline or hydrogel Secondary Dressing Woven Gauze Sponge, Non-Sterile 4x4 in Discharge Instruction: Apply over primary dressing as directed. Secured With Elastic Bandage 4 inch (ACE bandage) Discharge Instruction: Secure with ACE bandage as directed. Kerlix Roll Sterile, 4.5x3.1 (in/yd) Discharge Instruction: Secure with Kerlix as directed. Compression Wrap Compression Stockings Add-Ons Electronic Signature(s) Signed: 01/17/2023 4:25:31 PM By: [name] Entered By: Neil Schwalbe on 01/17/2023 13:03:38 -------------------------------------------------------------------------------- Wound Assessment Details Patient Name: Date of Service: Neil Pearson M M. 01/17/2023 1:00 PM Medical Record Number: [id number] Patient Account Number: [id number] Date of Birth/Sex: Treating [name]: Mar 09, 1946 (77 y.o. [name] Primary Care Wissam Resor: [name], DHA NA [facility] Other Clinician: Referring Ellinore Merced: Treating Jillane Po/Extender: [name] Weeks in [name]: 1 Wound Status Wound Number: 5 Primary Etiology: Abrasion Wound Location: Left Knee Wound Status: Healed - Epithelialized Wounding Event: Trauma Comorbid History: Cataracts, Sleep Apnea, Hypertension, Osteoarthritis Date Acquired: 12/03/2022 Weeks Of [name]: 1 Clustered Wound: No Photos [name], [name] ([id number]) [id number] Page 13 of 15 Wound Measurements Length: (cm) Width: (cm) Depth: (cm) Area: (cm) Volume: (cm) 0 % Reduction in Area: 100% 0 % Reduction in Volume: 100% 0  Epithelialization: Small (1-33%) 0 Tunneling: No 0 Undermining: No Wound Description Classification: Full Thickness Without Exposed Support Wound Margin: Flat and Intact Exudate Amount: None Present Structures Foul Odor After Cleansing: No Slough/Fibrino No Wound Bed Granulation Amount: None Present (0%) Exposed Structure Necrotic Amount: None Present (0%) Fascia Exposed: No Fat Layer (Subcutaneous Tissue) Exposed: No Tendon Exposed: No Muscle Exposed: No Joint Exposed: No Bone Exposed: No Periwound Skin Texture Texture Color No Abnormalities Noted:  Yes No Abnormalities Noted: Yes Moisture Temperature / Pain No Abnormalities Noted: Yes Temperature: No Abnormality Electronic Signature(s) Signed: 01/17/2023 4:25:31 PM By: [name] Entered By: Neil Schwalbe on 01/17/2023 13:19:28 -------------------------------------------------------------------------------- Wound Assessment Details Patient Name: Date of Service: Neil Pearson M M. 01/17/2023 1:00 PM Medical Record Number: [id number] Patient Account Number: [id number] Date of Birth/Sex: Treating [name]: [birth year] (77 y.o. [name] Primary Care Ramaj Frangos: [name], DHA NA [facility] Other Clinician: Referring Rayme Bui: Treating Rossie Bretado/Extender: [name] Weeks in [name]: 1 Wound Status Wound Number: 6 Primary Etiology: Pressure Ulcer Wound Location: Right Calcaneus Wound Status: Open Wounding Event: Pressure Injury Comorbid History: Cataracts, Sleep Apnea, Hypertension, Osteoarthritis Date Acquired: 12/03/2022 Weeks Of [name]: 1 Clustered Wound: No Photos [name], [name] ([id number]) [id number] Page 14 of 15 Wound Measurements Length: (cm) 1.8 Width: (cm) 2.4 Depth: (cm) 0.1 Area: (cm) 3.393 Volume: (cm) 0.339 % Reduction in Area: 52.5% % Reduction in Volume: 52.6% Epithelialization: Medium (34-66%) Tunneling: No Undermining:  No Wound Description Classification: Unstageable/Unclassified Wound Margin: Flat and Intact Exudate Amount: Medium Exudate Type: Serosanguineous Exudate Color: red, brown Foul Odor After Cleansing: No Slough/Fibrino Yes Wound Bed Granulation Amount: Medium (34-66%) Exposed Structure Granulation Quality: Red Fascia Exposed: No Necrotic Amount: Medium (34-66%) Fat Layer (Subcutaneous Tissue) Exposed: Yes Necrotic Quality: Eschar, Adherent Slough Tendon Exposed: No Muscle Exposed: No Joint Exposed: No Bone Exposed: No Periwound Skin Texture Texture Color No Abnormalities Noted: No No Abnormalities Noted: Yes Callus: Yes Temperature / Pain Temperature: No Abnormality Moisture No Abnormalities Noted: No Dry / Scaly: Yes [name] Notes Wound #6 (Calcaneus) Wound Laterality: Right Cleanser Peri-Wound Care Topical Primary Dressing Promogran Prisma Matrix, 4.34 (sq in) (silver collagen) Discharge Instruction: Moisten collagen with saline or hydrogel Secondary Dressing Woven Gauze Sponge, Non-Sterile 4x4 in Discharge Instruction: Apply over primary dressing as directed. Secured With Elastic Bandage 4 inch (ACE bandage) Discharge Instruction: Secure with ACE bandage as directed. Kerlix Roll Sterile, 4.5x3.1 (in/yd) Discharge Instruction: Secure with Kerlix as directed. Compression Wrap Compression Stockings Add-Ons Electronic Signature(s) [name], [name] ([id number]) [id number] Page 15 of 15 Signed: 01/17/2023 4:25:31 PM By: [name] Entered By: Neil Schwalbe on 01/17/2023 13:08:26 -------------------------------------------------------------------------------- Vitals Details Patient Name: Date of Service: Neil Blalock [name] Rockne Coons M M. 01/17/2023 1:00 PM Medical Record Number: [id number] Patient Account Number: [id number] Date of Birth/Sex: Treating [name]: Jul 21, 1945 (77 y.o. [name] Primary Care Laroy Mustard: [name], DHA NA  [facility] Other Clinician: Referring Sudie Bandel: Treating Alessander Sikorski/Extender: [name] Weeks in [name]: 1 Vital Signs Time Taken: 12:47 Temperature (F): 98.6 Height (in): 72 Pulse (bpm): 60 Weight (lbs): 240 Respiratory Rate (breaths/min): 18 Body Mass Index (BMI): 32.5 Blood Pressure (mmHg): 124/66 Reference Range: 80 - 120 mg / dl Electronic Signature(s) Signed: 01/17/2023 4:25:31 PM By: [name] Entered By: Neil Schwalbe on 01/17/2023 12:47:59

## 2023-01-17 NOTE — Progress Notes (Signed)
RHEA, THRUN ([id number]) [id number] Page 1 of 15 Visit Report for 01/17/2023 Chief Complaint Document Details Patient Name: Date of Service: [name] 01/17/2023 1:00 PM Medical Record Number: [id number] Patient Account Number: [id number] Date of Birth/Sex: Treating RN: 1945/08/03 (77 y.o. M) Primary Care Provider: Vilinda Boehringer, DHA NA [facility] Other Clinician: Referring Provider: Treating Provider/Extender: Estrella Myrtle SHI, DHA NA [facility] Weeks in Treatment: 1 Information Obtained from: Patient Chief Complaint Patient presents to the wound care center with open non-healing surgical wound(s) and pressure ulcers Electronic Signature(s) Signed: 01/17/2023 2:06:21 PM By: Neil Guess MD FACS Entered By: [name] on 01/17/2023 14:06:21 -------------------------------------------------------------------------------- Debridement Details Patient Name: Date of Service: [name] M M. 01/17/2023 1:00 PM Medical Record Number: [id number] Patient Account Number: [id number] Date of Birth/Sex: Treating RN: 16-Jan-1946 (77 y.o. Dianna Limbo Primary Care Provider: Vilinda Boehringer, DHA NA [facility] Other Clinician: Referring Provider: Treating Provider/Extender: Estrella Myrtle SHI, DHA NA [facility] Weeks in Treatment: 1 Debridement Performed for Assessment: Wound #6 Right Calcaneus Performed By: Physician Neil Guess, MD Debridement Type: Debridement Level of Consciousness (Pre-procedure): Awake and Alert Pre-procedure Verification/Time Out Yes - 13:17 Taken: Start Time: 13:17 Pain Control: Lidocaine 5% topical ointment Percent of Wound Bed Debrided: 100% T Area Debrided (cm): otal 3.39 Tissue and other material debrided: Non-Viable, Slough, Subcutaneous, Slough Level: Skin/Subcutaneous Tissue Debridement Description: Excisional Instrument: Curette Bleeding: Minimum Hemostasis Achieved: Pressure End Time:  13:19 Procedural Pain: 0 Post Procedural Pain: 0 Response to Treatment: Procedure was tolerated well Level of Consciousness (Post- Awake and Alert procedure): Post Debridement Measurements of Total Wound Length: (cm) 1.8 Stage: Unstageable/Unclassified Width: (cm) 2.4 Depth: (cm) 0.1 Volume: (cm) 0.339 Character of Wound/Ulcer Post Debridement: Improved [name] ([id number]) [id number] Page 2 of 15 Post Procedure Diagnosis Same as Pre-procedure Notes Scribed for Dr. Lady Gary by [name] Electronic Signature(s) Signed: 01/17/2023 3:28:51 PM By: Neil Guess MD FACS Signed: 01/17/2023 4:25:31 PM By: Neil Schwalbe RN Entered By: [name] on 01/17/2023 13:24:29 -------------------------------------------------------------------------------- Debridement Details Patient Name: Date of Service: [name] MSO Rockne Coons M M. 01/17/2023 1:00 PM Medical Record Number: [id number] Patient Account Number: [id number] Date of Birth/Sex: Treating RN: March 29, 1946 (77 y.o. Dianna Limbo Primary Care Provider: Vilinda Boehringer, DHA NA [facility] Other Clinician: Referring Provider: Treating Provider/Extender: Estrella Myrtle SHI, DHA NA [facility] Weeks in Treatment: 1 Debridement Performed for Assessment: Wound #1 Left,Lateral Lower Leg Performed By: Physician Neil Guess, MD Debridement Type: Debridement Level of Consciousness (Pre-procedure): Awake and Alert Pre-procedure Verification/Time Out Yes - 13:17 Taken: Start Time: 13:17 Pain Control: Lidocaine 5% topical ointment Percent of Wound Bed Debrided: 100% T Area Debrided (cm): otal 2.14 Tissue and other material debrided: Non-Viable, Slough, Subcutaneous, Slough Level: Skin/Subcutaneous Tissue Debridement Description: Excisional Instrument: Curette Bleeding: Minimum Hemostasis Achieved: Pressure End Time: 13:19 Procedural Pain: 0 Post Procedural Pain: 0 Response to Treatment: Procedure was  tolerated well Level of Consciousness (Post- Awake and Alert procedure): Post Debridement Measurements of Total Wound Length: (cm) 3.4 Width: (cm) 0.8 Depth: (cm) 0.1 Volume: (cm) 0.214 Character of Wound/Ulcer Post Debridement: Improved Post Procedure Diagnosis Same as Pre-procedure Notes Scribed for Dr. Lady Gary by [name] Electronic Signature(s) Signed: 01/17/2023 3:28:51 PM By: Neil Guess MD FACS Signed: 01/17/2023 4:25:31 PM By: Neil Schwalbe RN Entered By: [name] on 01/17/2023 13:29:13 [name] ([id number]) [id number] Page 3 of 15 -------------------------------------------------------------------------------- Debridement Details Patient Name: Date of Service: [facility] MSO Rockne Coons M M. 01/17/2023 1:00 PM Medical Record  Number: [id number] Patient Account Number: [id number] Date of Birth/Sex: Treating RN: [birth year] (77 y.o. Dianna Limbo Primary Care Provider: Vilinda Boehringer, DHA NA [facility] Other Clinician: Referring Provider: Treating Provider/Extender: Estrella Myrtle SHI, DHA NA [facility] Weeks in Treatment: 1 Debridement Performed for Assessment: Wound #2 Left,Posterior Lower Leg Performed By: Physician Neil Guess, MD Debridement Type: Debridement Level of Consciousness (Pre-procedure): Awake and Alert Pre-procedure Verification/Time Out Yes - 13:17 Taken: Start Time: 13:17 Pain Control: Lidocaine 5% topical ointment Percent of Wound Bed Debrided: 100% T Area Debrided (cm): otal 3.14 Tissue and other material debrided: Non-Viable, Eschar, Slough, Slough Level: Non-Viable Tissue Debridement Description: Selective/Open Wound Instrument: Curette Bleeding: Minimum Hemostasis Achieved: Pressure End Time: 13:19 Procedural Pain: 0 Post Procedural Pain: 0 Response to Treatment: Procedure was tolerated well Level of Consciousness (Post- Awake and Alert procedure): Post Debridement Measurements of Total  Wound Length: (cm) 4 Stage: Category/Stage III Width: (cm) 1 Depth: (cm) 0.1 Volume: (cm) 0.314 Character of Wound/Ulcer Post Debridement: Improved Post Procedure Diagnosis Same as Pre-procedure Notes Scribed for Dr. Lady Gary by [name] Electronic Signature(s) Signed: 01/17/2023 3:28:51 PM By: Neil Guess MD FACS Signed: 01/17/2023 4:25:31 PM By: Neil Schwalbe RN Entered By: [name] on 01/17/2023 13:30:34 -------------------------------------------------------------------------------- Debridement Details Patient Name: Date of Service: [name] MSO Rockne Coons M M. 01/17/2023 1:00 PM Medical Record Number: [id number] Patient Account Number: [id number] Date of Birth/Sex: Treating RN: 1945-12-10 (77 y.o. Dianna Limbo Primary Care Provider: Vilinda Boehringer, DHA NA [facility] Other Clinician: Referring Provider: Treating Provider/Extender: Estrella Myrtle SHI, DHA NA [facility] Weeks in Treatment: 1 Debridement Performed for Assessment: Wound #4 Left,Lateral Foot Performed By: Physician Neil Guess, MD Debridement Type: Debridement Level of Consciousness (Pre-procedure): Awake and Alert Pre-procedure Verification/Time Out [name], [name] ([id number]) [id number] Page 4 of 15 Pre-procedure Verification/Time Out Yes - 13:17 Taken: Start Time: 13:17 Pain Control: Lidocaine 5% topical ointment Percent of Wound Bed Debrided: 100% T Area Debrided (cm): otal 0.57 Tissue and other material debrided: Non-Viable, Eschar, Slough, Slough Level: Non-Viable Tissue Debridement Description: Selective/Open Wound Instrument: Curette Bleeding: Minimum Hemostasis Achieved: Pressure End Time: 13:19 Procedural Pain: 0 Post Procedural Pain: 0 Response to Treatment: Procedure was tolerated well Level of Consciousness (Post- Awake and Alert procedure): Post Debridement Measurements of Total Wound Length: (cm) 0.8 Stage: Category/Stage III Width: (cm)  0.9 Depth: (cm) 0.1 Volume: (cm) 0.057 Character of Wound/Ulcer Post Debridement: Improved Post Procedure Diagnosis Same as Pre-procedure Notes Scribed for Dr. Lady Gary by [name] Electronic Signature(s) Signed: 01/17/2023 3:28:51 PM By: Neil Guess MD FACS Signed: 01/17/2023 4:25:31 PM By: Neil Schwalbe RN Entered By: [name] on 01/17/2023 13:31:39 -------------------------------------------------------------------------------- Debridement Details Patient Name: Date of Service: [name] MSO Rockne Coons M M. 01/17/2023 1:00 PM Medical Record Number: [id number] Patient Account Number: [id number] Date of Birth/Sex: Treating RN: 01-07-1946 (77 y.o. Dianna Limbo Primary Care Provider: Vilinda Boehringer, DHA NA [facility] Other Clinician: Referring Provider: Treating Provider/Extender: Estrella Myrtle SHI, DHA NA [facility] Weeks in Treatment: 1 Debridement Performed for Assessment: Wound #3 Left,Medial Ankle Performed By: Physician Neil Guess, MD Debridement Type: Debridement Level of Consciousness (Pre-procedure): Awake and Alert Pre-procedure Verification/Time Out Yes - 13:17 Taken: Start Time: 13:17 Pain Control: Lidocaine 5% topical ointment Percent of Wound Bed Debrided: 100% T Area Debrided (cm): otal 0.51 Tissue and other material debrided: Non-Viable, Slough, Subcutaneous, Slough Level: Skin/Subcutaneous Tissue Debridement Description: Excisional Instrument: Curette Bleeding: Minimum Hemostasis Achieved: Pressure End Time: 13:19 Procedural Pain: 0 Post Procedural Pain: 0 Response to Treatment: Procedure was  tolerated well Level of Consciousness (Post- Awake and Alert procedure): [name], [name] ([id number]) [id number] Page 5 of 15 Post Debridement Measurements of Total Wound Length: (cm) 1.3 Stage: Category/Stage III Width: (cm) 0.5 Depth: (cm) 0.1 Volume: (cm) 0.051 Character of Wound/Ulcer Post Debridement: Improved Post  Procedure Diagnosis Same as Pre-procedure Notes Scribed for Dr. Lady Gary by [name] Electronic Signature(s) Signed: 01/17/2023 3:28:51 PM By: Neil Guess MD FACS Signed: 01/17/2023 4:25:31 PM By: Neil Schwalbe RN Entered By: [name] on 01/17/2023 13:33:23 -------------------------------------------------------------------------------- HPI Details Patient Name: Date of Service: [name] MSO Rockne Coons M M. 01/17/2023 1:00 PM Medical Record Number: [id number] Patient Account Number: [id number] Date of Birth/Sex: Treating RN: [birth year] (77 y.o. M) Primary Care Provider: Vilinda Boehringer, DHA NA [facility] Other Clinician: Referring Provider: Treating Provider/Extender: Estrella Myrtle SHI, DHA NA [facility] Weeks in Treatment: 1 History of Present Illness HPI Description: ADMISSION 01/09/2023 This is a 77 year old known diabetic who underwent a left ankle ORIF on Nov 26, 2022. He was seen by orthopedic surgery for follow-up on December 26, 2022 and was noted to have breakdown of his left lateral leg incision. He was also given a course of Keflex at that time. He also developed pressure ulcers on his posterior Achilles area, his lateral calcaneus, and his medial malleolus from the boot he was wearing. He has a pressure ulcer on his right heel, as well. He was referred to the wound care center for further evaluation and management. 01/17/2023: All of the wounds are little bit smaller today with the exception of the one over his posterior Achilles on the left. This wound is bigger and has a fibrous layer to it that is densely adherent. There is slough accumulation on all of the wounds. He says he has not been using the Prevalon boot because he frequently has to get up and urinate but he does report that he is floating his heels on pillows. Electronic Signature(s) Signed: 01/17/2023 2:07:33 PM By: Neil Guess MD FACS Entered By: [name] on 01/17/2023  14:07:33 -------------------------------------------------------------------------------- Physical Exam Details Patient Name: Date of Service: [name] M M. 01/17/2023 1:00 PM Medical Record Number: [id number] Patient Account Number: [id number] Date of Birth/Sex: Treating RN: [birth year] (77 y.o. M) Primary Care Provider: Vilinda Boehringer, DHA NA [facility] Other Clinician: Referring Provider: Treating Provider/Extender: Estrella Myrtle SHI, DHA NA [facility] Weeks in Treatment: 1 Constitutional . . . . no acute distress. Respiratory CHRISHUN, SCHEER ([id number]) [id number] Page 6 of 15 Normal work of breathing on room air. Notes 01/17/2023: All of the wounds are little bit smaller today with the exception of the one over his posterior Achilles on the left. This wound is bigger and has a fibrous layer to it that is densely adherent. There is slough accumulation on all of the wounds. Electronic Signature(s) Signed: 01/17/2023 2:08:08 PM By: Neil Guess MD FACS Entered By: [name] on 01/17/2023 14:08:08 -------------------------------------------------------------------------------- Physician Orders Details Patient Name: Date of Service: [name] M M. 01/17/2023 1:00 PM Medical Record Number: [id number] Patient Account Number: [id number] Date of Birth/Sex: Treating RN: 1945-11-26 (77 y.o. Dianna Limbo Primary Care Provider: Vilinda Boehringer, DHA NA [facility] Other Clinician: Referring Provider: Treating Provider/Extender: Estrella Myrtle SHI, DHA NA [facility] Weeks in Treatment: 1 Verbal / Phone Orders: No Diagnosis Coding ICD-10 Coding Code Description L97.322 Non-pressure chronic ulcer of left ankle with fat layer exposed L89.623 Pressure ulcer of left heel, stage 3 L89.523 Pressure ulcer of left ankle, stage 3 L89.613  Pressure ulcer of right heel, stage 3 G62.9 Polyneuropathy, unspecified Follow-up Appointments ppointment in 1 week.  - Dr. Lady Gary RM 1 Friday 01/27/23 at 8:45am Return A Anesthetic Wound #1 Left,Lateral Lower Leg (In clinic) Topical Lidocaine 4% applied to wound bed Wound #2 Left,Posterior Lower Leg (In clinic) Topical Lidocaine 4% applied to wound bed Wound #3 Left,Medial Ankle (In clinic) Topical Lidocaine 4% applied to wound bed Wound #4 Left,Lateral Foot (In clinic) Topical Lidocaine 4% applied to wound bed Wound #6 Right Calcaneus (In clinic) Topical Lidocaine 4% applied to wound bed Bathing/ Shower/ Hygiene May shower and wash wound with soap and water. Off-Loading DH Walker Boot to: - left foot per ortho Prevalon Boot - Prevalon boot to right foot especially while in bed Other: - Avoid pressure on the posterior (back ) of left lower leg Home Health New wound care orders this week; continue Home Health for wound care. May utilize formulary equivalent dressing for wound treatment orders unless otherwise specified. - Santyl to Left Posterior Lower leg. (May use Medihoney if Melburn Popper is not available). Then cover over the santyl in the wound bed with [facility] Blue Ready. Continue to use Collagen to all the other wounds. Dressing changes to be completed by Home Health on Monday / Wednesday / Friday except when patient has scheduled visit at [facility]. Other Home Health Orders/Instructions: - Verna Czech fax- [phone] Wound Treatment Wound #1 - Lower Leg Wound Laterality: Left, Lateral Prim Dressing: Promogran Prisma Matrix, 4.34 (sq in) (silver collagen) ary 3 x Per Week/30 Days SUHEYB, RAUCCI ([id number]) [id number] Page 7 of 15 Discharge Instructions: Moisten collagen with saline or hydrogel Secondary Dressing: Woven Gauze Sponge, Non-Sterile 4x4 in 3 x Per Week/30 Days Discharge Instructions: Apply over primary dressing as directed. Secured With: Elastic Bandage 4 inch (ACE bandage) 3 x Per Week/30 Days Discharge Instructions: Secure  with ACE bandage as directed. Secured With: [employer], 4.5x3.1 (in/yd) 3 x Per Week/30 Days Discharge Instructions: Secure with Kerlix as directed. Wound #2 - Lower Leg Wound Laterality: Left, Posterior Prim Dressing: Hydrofera Blue Ready Transfer Foam, 2.5x2.5 (in/in) 3 x Per Week/30 Days ary Discharge Instructions: Apply directly to wound bed as directed Prim Dressing: Santyl Ointment 3 x Per Week/30 Days ary Discharge Instructions: Apply nickel thick amount to wound bed as instructed Secondary Dressing: Woven Gauze Sponge, Non-Sterile 4x4 in 3 x Per Week/30 Days Discharge Instructions: Apply over primary dressing as directed. Secured With: Elastic Bandage 4 inch (ACE bandage) 3 x Per Week/30 Days Discharge Instructions: Secure with ACE bandage as directed. Secured With: [employer], 4.5x3.1 (in/yd) 3 x Per Week/30 Days Discharge Instructions: Secure with Kerlix as directed. Wound #3 - Ankle Wound Laterality: Left, Medial Prim Dressing: Promogran Prisma Matrix, 4.34 (sq in) (silver collagen) 3 x Per Week/30 Days ary Discharge Instructions: Moisten collagen with saline or hydrogel Secondary Dressing: Woven Gauze Sponge, Non-Sterile 4x4 in 3 x Per Week/30 Days Discharge Instructions: Apply over primary dressing as directed. Secured With: Elastic Bandage 4 inch (ACE bandage) 3 x Per Week/30 Days Discharge Instructions: Secure with ACE bandage as directed. Secured With: [employer], 4.5x3.1 (in/yd) 3 x Per Week/30 Days Discharge Instructions: Secure with Kerlix as directed. Wound #4 - Foot Wound Laterality: Left, Lateral Prim Dressing: Promogran Prisma Matrix, 4.34 (sq in) (silver collagen) 3 x Per Week/30 Days ary Discharge Instructions: Moisten collagen with saline or hydrogel Secondary Dressing: Woven Gauze Sponge, Non-Sterile 4x4 in 3 x Per Week/30 Days Discharge  Instructions: Apply over primary dressing as directed. Secured With: Elastic Bandage 4 inch  (ACE bandage) 3 x Per Week/30 Days Discharge Instructions: Secure with ACE bandage as directed. Secured With: [employer], 4.5x3.1 (in/yd) 3 x Per Week/30 Days Discharge Instructions: Secure with Kerlix as directed. Wound #6 - Calcaneus Wound Laterality: Right Prim Dressing: Promogran Prisma Matrix, 4.34 (sq in) (silver collagen) 3 x Per Week/30 Days ary Discharge Instructions: Moisten collagen with saline or hydrogel Secondary Dressing: Woven Gauze Sponge, Non-Sterile 4x4 in 3 x Per Week/30 Days Discharge Instructions: Apply over primary dressing as directed. Secured With: Elastic Bandage 4 inch (ACE bandage) 3 x Per Week/30 Days Discharge Instructions: Secure with ACE bandage as directed. Secured With: [employer], 4.5x3.1 (in/yd) 3 x Per Week/30 Days Discharge Instructions: Secure with Kerlix as directed. Patient Medications llergies: codeine A Notifications Medication Indication Start End 01/17/2023 Santyl DOSE topical 250 unit/gram ointment - Apply to posterior Achilles wound with dressing changes [name], [name] ([id number]) [id number] Page 8 of 15 Electronic Signature(s) Signed: 01/17/2023 3:28:51 PM By: Neil Guess MD FACS Previous Signature: 01/17/2023 2:11:24 PM Version By: Neil Guess MD FACS Entered By: [name] on 01/17/2023 14:11:36 -------------------------------------------------------------------------------- Problem List Details Patient Name: Date of Service: [name] M M. 01/17/2023 1:00 PM Medical Record Number: [id number] Patient Account Number: [id number] Date of Birth/Sex: Treating RN: 05-31-1946 (77 y.o. M) Primary Care Provider: Vilinda Boehringer, DHA NA [facility] Other Clinician: Referring Provider: Treating Provider/Extender: Estrella Myrtle SHI, DHA NA [facility] Weeks in Treatment: 1 Active Problems ICD-10 Encounter Code Description Active Date MDM Diagnosis L97.322 Non-pressure chronic  ulcer of left ankle with fat layer exposed 01/09/2023 No Yes L89.623 Pressure ulcer of left heel, stage 3 01/09/2023 No Yes L89.523 Pressure ulcer of left ankle, stage 3 01/09/2023 No Yes L89.613 Pressure ulcer of right heel, stage 3 01/09/2023 No Yes G62.9 Polyneuropathy, unspecified 01/09/2023 No Yes Inactive Problems Resolved Problems Electronic Signature(s) Signed: 01/17/2023 2:06:06 PM By: Neil Guess MD FACS Entered By: [name] on 01/17/2023 14:06:06 -------------------------------------------------------------------------------- Progress Note Details Patient Name: Date of Service: [name] M M. 01/17/2023 1:00 PM Medical Record Number: [id number] Patient Account Number: [id number] Date of Birth/Sex: Treating RN: April 22, 1946 (77 y.o. M) Primary Care Provider: Vilinda Boehringer, DHA NA [facility] Other Clinician: Referring Provider: Treating Provider/Extender: Estrella Myrtle SHI, DHA NA [facility] Weeks in Treatment: 1 [name], [name] ([id number]) [id number] Page 9 of 15 Subjective Chief Complaint Information obtained from Patient Patient presents to the wound care center with open non-healing surgical wound(s) and pressure ulcers History of Present Illness (HPI) ADMISSION 01/09/2023 This is a 77 year old known diabetic who underwent a left ankle ORIF on Nov 26, 2022. He was seen by orthopedic surgery for follow-up on December 26, 2022 and was noted to have breakdown of his left lateral leg incision. He was also given a course of Keflex at that time. He also developed pressure ulcers on his posterior Achilles area, his lateral calcaneus, and his medial malleolus from the boot he was wearing. He has a pressure ulcer on his right heel, as well. He was referred to the wound care center for further evaluation and management. 01/17/2023: All of the wounds are little bit smaller today with the exception of the one over his posterior Achilles on the left. This  wound is bigger and has a fibrous layer to it that is densely adherent. There is slough accumulation on all of the wounds. He says he has not been  using the Prevalon boot because he frequently has to get up and urinate but he does report that he is floating his heels on pillows. Patient History Information obtained from Patient, Caregiver, Chart. Family History Heart Disease - Mother,Father, Hypertension - Mother,Father, Stroke - Father, No family history of Cancer, Diabetes, Hereditary Spherocytosis, Kidney Disease, Lung Disease, Seizures, Thyroid Problems, Tuberculosis. Social History Never smoker, Marital Status - Married, Alcohol Use - Rarely, Drug Use - No History, Caffeine Use - Daily - coffee. Medical History Eyes Patient has history of Cataracts - bil extractions Denies history of Glaucoma, Optic Neuritis Ear/Nose/Mouth/Throat Denies history of Chronic sinus problems/congestion, Middle ear problems Respiratory Patient has history of Sleep Apnea Cardiovascular Patient has history of Hypertension Endocrine Denies history of Type I Diabetes, Type II Diabetes Genitourinary Denies history of End Stage Renal Disease Immunological Denies history of Lupus Erythematosus, Raynauds, Scleroderma Integumentary (Skin) Denies history of History of Burn Musculoskeletal Patient has history of Osteoarthritis Denies history of Gout, Rheumatoid Arthritis, Osteomyelitis Neurologic Denies history of Dementia, Neuropathy, Quadriplegia, Paraplegia, Seizure Disorder Oncologic Denies history of Received Chemotherapy, Received Radiation Psychiatric Denies history of Anorexia/bulimia, Confinement Anxiety Hospitalization/Surgery History - bil knee replacements. - ORIF left ankel 5/23 2024. - cholecystectomy. - cervical fusion. Medical A Surgical History Notes nd Respiratory hx aspiration PNA, asbestosis Gastrointestinal duodenitis, constipation Musculoskeletal ankylosing spondylitis,  compression fx T10 Objective Constitutional no acute distress. Vitals Time Taken: 12:47 PM, Height: 72 in, Weight: 240 lbs, BMI: 32.5, Temperature: 98.6 F, Pulse: 60 bpm, Respiratory Rate: 18 breaths/min, Blood Pressure: 124/66 mmHg. Respiratory Normal work of breathing on room air. [name], [name] ([id number]) [id number] Page 10 of 15 General Notes: 01/17/2023: All of the wounds are little bit smaller today with the exception of the one over his posterior Achilles on the left. This wound is bigger and has a fibrous layer to it that is densely adherent. There is slough accumulation on all of the wounds. Integumentary (Hair, Skin) Wound #1 status is Open. Original cause of wound was Surgical Injury. The date acquired was: 11/24/2022. The wound has been in treatment 1 weeks. The wound is located on the Left,Lateral Lower Leg. The wound measures 3.4cm length x 0.8cm width x 0.1cm depth; 2.136cm^2 area and 0.214cm^3 volume. There is Fat Layer (Subcutaneous Tissue) exposed. There is no tunneling or undermining noted. There is a medium amount of serosanguineous drainage noted. The wound margin is distinct with the outline attached to the wound base. There is large (67-100%) pink granulation within the wound bed. There is a small (1-33%) amount of necrotic tissue within the wound bed including Eschar and Adherent Slough. The periwound skin appearance had no abnormalities noted for texture. The periwound skin appearance had no abnormalities noted for moisture. The periwound skin appearance had no abnormalities noted for color. Periwound temperature was noted as No Abnormality. Wound #2 status is Open. Original cause of wound was Pressure Injury. The date acquired was: 12/03/2022. The wound has been in treatment 1 weeks. The wound is located on the Left,Posterior Lower Leg. The wound measures 4cm length x 1cm width x 0.1cm depth; 3.142cm^2 area and 0.314cm^3 volume. There is  Fat Layer (Subcutaneous Tissue) exposed. There is no tunneling or undermining noted. There is a medium amount of serosanguineous drainage noted. The wound margin is flat and intact. There is small (1-33%) red, pink granulation within the wound bed. There is a large (67-100%) amount of necrotic tissue within the wound bed including Adherent Slough. The periwound skin appearance had no  abnormalities noted for texture. The periwound skin appearance had no abnormalities noted for moisture. The periwound skin appearance had no abnormalities noted for color. Periwound temperature was noted as No Abnormality. Wound #3 status is Open. Original cause of wound was Pressure Injury. The date acquired was: 12/03/2022. The wound has been in treatment 1 weeks. The wound is located on the Left,Medial Ankle. The wound measures 1.3cm length x 0.5cm width x 0.1cm depth; 0.511cm^2 area and 0.051cm^3 volume. There is Fat Layer (Subcutaneous Tissue) exposed. There is no tunneling or undermining noted. There is a small amount of serosanguineous drainage noted. The wound margin is flat and intact. There is small (1-33%) pink granulation within the wound bed. There is a large (67-100%) amount of necrotic tissue within the wound bed including Adherent Slough. The periwound skin appearance had no abnormalities noted for texture. The periwound skin appearance had no abnormalities noted for moisture. The periwound skin appearance had no abnormalities noted for color. Periwound temperature was noted as No Abnormality. Wound #4 status is Open. Original cause of wound was Pressure Injury. The date acquired was: 12/03/2022. The wound has been in treatment 1 weeks. The wound is located on the Left,Lateral Foot. The wound measures 0.8cm length x 0.9cm width x 0.1cm depth; 0.565cm^2 area and 0.057cm^3 volume. There is Fat Layer (Subcutaneous Tissue) exposed. There is no tunneling or undermining noted. There is a medium amount of  serosanguineous drainage noted. The wound margin is flat and intact. There is small (1-33%) red, pink granulation within the wound bed. There is a large (67-100%) amount of necrotic tissue within the wound bed including Eschar and Adherent Slough. The periwound skin appearance had no abnormalities noted for texture. The periwound skin appearance had no abnormalities noted for moisture. The periwound skin appearance had no abnormalities noted for color. Periwound temperature was noted as No Abnormality. The periwound has tenderness on palpation. Wound #5 status is Healed - Epithelialized. Original cause of wound was Trauma. The date acquired was: 12/03/2022. The wound has been in treatment 1 weeks. The wound is located on the Left Knee. The wound measures 0cm length x 0cm width x 0cm depth; 0cm^2 area and 0cm^3 volume. There is no tunneling or undermining noted. There is a none present amount of drainage noted. The wound margin is flat and intact. There is no granulation within the wound bed. There is no necrotic tissue within the wound bed. The periwound skin appearance had no abnormalities noted for texture. The periwound skin appearance had no abnormalities noted for moisture. The periwound skin appearance had no abnormalities noted for color. Periwound temperature was noted as No Abnormality. Wound #6 status is Open. Original cause of wound was Pressure Injury. The date acquired was: 12/03/2022. The wound has been in treatment 1 weeks. The wound is located on the Right Calcaneus. The wound measures 1.8cm length x 2.4cm width x 0.1cm depth; 3.393cm^2 area and 0.339cm^3 volume. There is Fat Layer (Subcutaneous Tissue) exposed. There is no tunneling or undermining noted. There is a medium amount of serosanguineous drainage noted. The wound margin is flat and intact. There is medium (34-66%) red granulation within the wound bed. There is a medium (34-66%) amount of necrotic tissue within the wound bed  including Eschar and Adherent Slough. The periwound skin appearance had no abnormalities noted for color. The periwound skin appearance exhibited: Callus, Dry/Scaly. Periwound temperature was noted as No Abnormality. 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 There was a Excisional Skin/Subcutaneous Tissue Debridement with a total area of 2.14 sq cm performed by Neil Guess, MD. With the following instrument(s): Curette to remove Non-Viable tissue/material. Material removed includes Subcutaneous Tissue and Slough and after achieving pain control using Lidocaine 5% topical ointment. No specimens were taken. A time out was conducted at 13:17, prior to the start of the procedure. A Minimum amount of bleeding was controlled with Pressure. The procedure was tolerated well with a pain level of 0 throughout and a pain level of 0 following the procedure. Post Debridement Measurements: 3.4cm length x 0.8cm width x 0.1cm depth; 0.214cm^3 volume. Character of Wound/Ulcer Post Debridement is improved. Post procedure Diagnosis Wound #1: Same as Pre-Procedure General Notes: Scribed for Dr. Lady Gary by [name]. Wound #2 Pre-procedure diagnosis of Wound #2 is a Pressure Ulcer located on the Left,Posterior Lower Leg . There was a Selective/Open Wound Non-Viable Tissue Debridement with a total area of 3.14 sq cm performed by Neil Guess, MD. With the following instrument(s): Curette to remove Non-Viable tissue/material. Material removed includes Eschar and Slough and after achieving pain control using Lidocaine 5% topical ointment. No specimens were taken. A time out was conducted at  13:17, prior to the start of the procedure. A Minimum amount of bleeding was controlled with Pressure. The procedure was tolerated well with a pain level of 0 throughout and a pain level of 0 following the procedure. Post Debridement Measurements: 4cm length x 1cm width x 0.1cm depth; 0.314cm^3 volume. Post debridement Stage noted as Category/Stage III. Character of Wound/Ulcer Post Debridement is improved. Post procedure Diagnosis Wound #2: Same as Pre-Procedure [name], SELSOR ([id number]) [id number] Page 11 of 15 General Notes: Scribed for Dr. Lady Gary by [name]. Wound #3 Pre-procedure diagnosis of Wound #3 is a Pressure Ulcer located on the Left,Medial Ankle . There was a Excisional Skin/Subcutaneous Tissue Debridement with a total area of 0.51 sq cm performed by Neil Guess, MD. With the following instrument(s): Curette to remove Non-Viable tissue/material. Material removed includes Subcutaneous Tissue and Slough and after achieving pain control using Lidocaine 5% topical ointment. No specimens were taken. A time out was conducted at 13:17, prior to the start of the procedure. A Minimum amount of bleeding was controlled with Pressure. The procedure was tolerated well with a pain level of 0 throughout and a pain level of 0 following the procedure. Post Debridement Measurements: 1.3cm length x 0.5cm width x 0.1cm depth; 0.051cm^3 volume. Post debridement Stage noted as Category/Stage III. Character of Wound/Ulcer Post Debridement is improved. Post procedure Diagnosis Wound #3: Same as Pre-Procedure General Notes: Scribed for Dr. Lady Gary by [name]. Wound #4 Pre-procedure diagnosis of Wound #4 is a Pressure Ulcer located on the Left,Lateral Foot . There was a Selective/Open Wound Non-Viable Tissue Debridement with a total area of 0.57 sq cm performed by Neil Guess, MD. With the following instrument(s): Curette to remove Non-Viable tissue/material.  Material removed includes Eschar and Slough and after achieving pain control using Lidocaine 5% topical ointment. No specimens were taken. A time out was conducted at 13:17, prior to the start of the procedure. A Minimum amount of bleeding was controlled with Pressure. The procedure was tolerated well with a pain level of 0 throughout and a pain level of 0 following  the procedure. Post Debridement Measurements: 0.8cm length x 0.9cm width x 0.1cm depth; 0.057cm^3 volume. Post debridement Stage noted as Category/Stage III. Character of Wound/Ulcer Post Debridement is improved. Post procedure Diagnosis Wound #4: Same as Pre-Procedure General Notes: Scribed for Dr. Lady Gary by [name]. Wound #6 Pre-procedure diagnosis of Wound #6 is a Pressure Ulcer located on the Right Calcaneus . There was a Excisional Skin/Subcutaneous Tissue Debridement with a total area of 3.39 sq cm performed by Neil Guess, MD. With the following instrument(s): Curette to remove Non-Viable tissue/material. Material removed includes Subcutaneous Tissue and Slough and after achieving pain control using Lidocaine 5% topical ointment. No specimens were taken. A time out was conducted at 13:17, prior to the start of the procedure. A Minimum amount of bleeding was controlled with Pressure. The procedure was tolerated well with a pain level of 0 throughout and a pain level of 0 following the procedure. Post Debridement Measurements: 1.8cm length x 2.4cm width x 0.1cm depth; 0.339cm^3 volume. Post debridement Stage noted as Unstageable/Unclassified. Character of Wound/Ulcer Post Debridement is improved. Post procedure Diagnosis Wound #6: Same as Pre-Procedure General Notes: Scribed for Dr. Lady Gary by [name]. Plan Follow-up Appointments: Return Appointment in 1 week. - Dr. Lady Gary RM 1 Friday 01/27/23 at 8:45am Anesthetic: Wound #1 Left,Lateral Lower Leg: (In clinic) Topical Lidocaine 4% applied to wound bed Wound #2  Left,Posterior Lower Leg: (In clinic) Topical Lidocaine 4% applied to wound bed Wound #3 Left,Medial Ankle: (In clinic) Topical Lidocaine 4% applied to wound bed Wound #4 Left,Lateral Foot: (In clinic) Topical Lidocaine 4% applied to wound bed Wound #6 Right Calcaneus: (In clinic) Topical Lidocaine 4% applied to wound bed Bathing/ Shower/ Hygiene: May shower and wash wound with soap and water. Off-Loading: DH Walker Boot to: - left foot per ortho Prevalon Boot - Prevalon boot to right foot especially while in bed Other: - Avoid pressure on the posterior (back ) of left lower leg Home Health: New wound care orders this week; continue Home Health for wound care. May utilize formulary equivalent dressing for wound treatment orders unless otherwise specified. - Santyl to Left Posterior Lower leg. (May use Medihoney if Melburn Popper is not available). Then cover over the santyl in the wound bed with [facility] Blue Ready. Continue to use Collagen to all the other wounds. Dressing changes to be completed by Home Health on Monday / Wednesday / Friday except when patient has scheduled visit at [facility]. Other Home Health Orders/Instructions: Verna Czech fax- [phone] The following medication(s) was prescribed: Santyl topical 250 unit/gram ointment Apply to posterior Achilles wound with dressing changes starting 01/17/2023 WOUND #1: - Lower Leg Wound Laterality: Left, Lateral Prim Dressing: Promogran Prisma Matrix, 4.34 (sq in) (silver collagen) 3 x Per Week/30 Days ary Discharge Instructions: Moisten collagen with saline or hydrogel Secondary Dressing: Woven Gauze Sponge, Non-Sterile 4x4 in 3 x Per Week/30 Days Discharge Instructions: Apply over primary dressing as directed. Secured With: Elastic Bandage 4 inch (ACE bandage) 3 x Per Week/30 Days Discharge Instructions: Secure with ACE bandage as directed. Secured With: [employer], 4.5x3.1 (in/yd) 3 x Per Week/30  Days Discharge Instructions: Secure with Kerlix as directed. WOUND #2: - Lower Leg Wound Laterality: Left, Posterior Prim Dressing: Hydrofera Blue Ready Transfer Foam, 2.5x2.5 (in/in) 3 x Per Week/30 Days ary Discharge Instructions: Apply directly to wound bed as directed Prim Dressing: Santyl Ointment 3 x Per Week/30 Days ary Discharge Instructions: Apply nickel thick amount to wound bed as instructed Secondary Dressing: Woven  Gauze Sponge, Non-Sterile 4x4 in 3 x Per Week/30 Days Discharge Instructions: Apply over primary dressing as directed. Secured With: Elastic Bandage 4 inch (ACE bandage) 3 x Per Week/30 Days Discharge Instructions: Secure with ACE bandage as directed. Secured With: [employer], 4.5x3.1 (in/yd) 3 x Per Week/30 Days Discharge Instructions: Secure with Kerlix as directed. WOUND #3: - Ankle Wound Laterality: Left, Medial Prim Dressing: Promogran Prisma Matrix, 4.34 (sq in) (silver collagen) 3 x Per Week/30 Days ary Discharge Instructions: Moisten collagen with saline or hydrogel [name], [name] ([id number]) [id number] Page 12 of 15 Secondary Dressing: Woven Gauze Sponge, Non-Sterile 4x4 in 3 x Per Week/30 Days Discharge Instructions: Apply over primary dressing as directed. Secured With: Elastic Bandage 4 inch (ACE bandage) 3 x Per Week/30 Days Discharge Instructions: Secure with ACE bandage as directed. Secured With: [employer], 4.5x3.1 (in/yd) 3 x Per Week/30 Days Discharge Instructions: Secure with Kerlix as directed. WOUND #4: - Foot Wound Laterality: Left, Lateral Prim Dressing: Promogran Prisma Matrix, 4.34 (sq in) (silver collagen) 3 x Per Week/30 Days ary Discharge Instructions: Moisten collagen with saline or hydrogel Secondary Dressing: Woven Gauze Sponge, Non-Sterile 4x4 in 3 x Per Week/30 Days Discharge Instructions: Apply over primary dressing as directed. Secured With: Elastic Bandage 4 inch (ACE  bandage) 3 x Per Week/30 Days Discharge Instructions: Secure with ACE bandage as directed. Secured With: [employer], 4.5x3.1 (in/yd) 3 x Per Week/30 Days Discharge Instructions: Secure with Kerlix as directed. WOUND #6: - Calcaneus Wound Laterality: Right Prim Dressing: Promogran Prisma Matrix, 4.34 (sq in) (silver collagen) 3 x Per Week/30 Days ary Discharge Instructions: Moisten collagen with saline or hydrogel Secondary Dressing: Woven Gauze Sponge, Non-Sterile 4x4 in 3 x Per Week/30 Days Discharge Instructions: Apply over primary dressing as directed. Secured With: Elastic Bandage 4 inch (ACE bandage) 3 x Per Week/30 Days Discharge Instructions: Secure with ACE bandage as directed. Secured With: [employer], 4.5x3.1 (in/yd) 3 x Per Week/30 Days Discharge Instructions: Secure with Kerlix as directed. 01/17/2023: All of the wounds are little bit smaller today with the exception of the one over his posterior Achilles on the left. This wound is bigger and has a fibrous layer to it that is densely adherent. There is slough accumulation on all of the wounds. I used a curette to debride slough from each of his wounds. I also debrided some subcutaneous tissue from the right calcaneal ulcer. The fibrous layer on the Achilles wound did not come away very easily so I am going to apply Santyl to this site. If Santyl is unavailable, we will use Medihoney. I am going to use Hydrofera Blue over this wound but continue collagen to the other sites. He needs to avoid applying any pressure to the Achilles wound in particular, but also continue to float his heels. He will follow-up in 1 week. Electronic Signature(s) Signed: 01/17/2023 2:13:58 PM By: Neil Guess MD FACS Entered By: [name] on 01/17/2023 14:13:58 -------------------------------------------------------------------------------- HxROS Details Patient Name: Date of Service: [name] M M. 01/17/2023 1:00  PM Medical Record Number: [id number] Patient Account Number: [id number] Date of Birth/Sex: Treating RN: Aug 21, 1945 (77 y.o. M) Primary Care Provider: Vilinda Boehringer, DHA NA [facility] Other Clinician: Referring Provider: Treating Provider/Extender: Estrella Myrtle SHI, DHA NA [facility] Weeks in Treatment: 1 Information Obtained From Patient Caregiver Chart Eyes Medical History: Positive for: Cataracts - bil extractions Negative for: Glaucoma; Optic Neuritis Ear/Nose/Mouth/Throat Medical History: Negative for: Chronic sinus problems/congestion; Middle ear problems Respiratory  Medical History: Positive for: Sleep Apnea Past Medical History Notes: hx aspiration PNA, asbestosis Cardiovascular CHARLI, LIBERATORE ([id number]) [id number] Page 13 of 15 Medical History: Positive for: Hypertension Gastrointestinal Medical History: Past Medical History Notes: duodenitis, constipation Endocrine Medical History: Negative for: Type I Diabetes; Type II Diabetes Genitourinary Medical History: Negative for: End Stage Renal Disease Immunological Medical History: Negative for: Lupus Erythematosus; Raynauds; Scleroderma Integumentary (Skin) Medical History: Negative for: History of Burn Musculoskeletal Medical History: Positive for: Osteoarthritis Negative for: Gout; Rheumatoid Arthritis; Osteomyelitis Past Medical History Notes: ankylosing spondylitis, compression fx T10 Neurologic Medical History: Negative for: Dementia; Neuropathy; Quadriplegia; Paraplegia; Seizure Disorder Oncologic Medical History: Negative for: Received Chemotherapy; Received Radiation Psychiatric Medical History: Negative for: Anorexia/bulimia; Confinement Anxiety HBO Extended History Items Eyes: Cataracts Immunizations Pneumococcal Vaccine: Received Pneumococcal Vaccination: Yes Received Pneumococcal Vaccination On or After 60th Birthday: Yes Implantable  Devices Yes Hospitalization / Surgery History Type of Hospitalization/Surgery bil knee replacements ORIF left ankel 5/23 2024 cholecystectomy cervical fusion Family and Social History Cancer: No; Diabetes: No; Heart Disease: Yes - Mother,Father; Hereditary Spherocytosis: No; Hypertension: Yes - Mother,Father; Kidney Disease: No; Lung Disease: No; Seizures: No; Stroke: Yes - Father; Thyroid Problems: No; Tuberculosis: No; Never smoker; Marital Status - Married; Alcohol Use: Rarely; Drug Use: No History; Caffeine Use: Daily - coffee; Financial Concerns: No; Food, Clothing or Shelter Needs: No; Support System Lacking: No; Transportation Concerns: No DAMARIAN, PRIOLA ([id number]) [id number] Page 14 of 15 Electronic Signature(s) Signed: 01/17/2023 3:28:51 PM By: Neil Guess MD FACS Entered By: [name] on 01/17/2023 14:07:42 -------------------------------------------------------------------------------- SuperBill Details Patient Name: Date of Service: [name] MSO [name]. 01/17/2023 Medical Record Number: [id number] Patient Account Number: [id number] Date of Birth/Sex: Treating RN: 1945/11/14 (77 y.o. M) Primary Care Provider: Vilinda Boehringer, DHA NA [facility] Other Clinician: Referring Provider: Treating Provider/Extender: Estrella Myrtle SHI, DHA NA [facility] Weeks in Treatment: 1 Diagnosis Coding ICD-10 Codes Code Description [phone] Non-pressure chronic ulcer of left ankle with fat layer exposed L89.623 Pressure ulcer of left heel, stage 3 L89.523 Pressure ulcer of left ankle, stage 3 L89.613 Pressure ulcer of right heel, stage 3 G62.9 Polyneuropathy, unspecified Facility Procedures : CPT4 Code: [id number] Description: 11042 - DEB SUBQ TISSUE 20 SQ CM/< ICD-10 Diagnosis Description L89.613 Pressure ulcer of right heel, stage 3 Modifier: Quantity: 1 : CPT4 Code: [id number] Description: 97597 - DEBRIDE WOUND 1ST 20 SQ CM OR < ICD-10 Diagnosis  Description L97.322 Non-pressure chronic ulcer of left ankle with fat layer exposed L89.623 Pressure ulcer of left heel, stage 3 L89.523 Pressure ulcer of left ankle, stage 3 Modifier: Quantity: 1 Physician Procedures : CPT4 Code Description Modifier [id number] - WC PHYS LEVEL 4 - EST PT 25 ICD-10 Diagnosis Description L97.322 Non-pressure chronic ulcer of left ankle with fat layer exposed L89.623 Pressure ulcer of left heel, stage 3 L89.523 Pressure ulcer of left  ankle, stage 3 L89.613 Pressure ulcer of right heel, stage 3 Quantity: 1 : [id number] 11042 - WC PHYS SUBQ TISS 20 SQ CM ICD-10 Diagnosis Description L89.613 Pressure ulcer of right heel, stage 3 Quantity: 1 : [id number] 97597 - WC PHYS DEBR WO ANESTH 20 SQ CM ICD-10 Diagnosis Description L97.322 Non-pressure chronic ulcer of left ankle with fat layer exposed L89.623 Pressure ulcer of left heel, stage 3 L89.523 Pressure ulcer of left ankle, stage 3 Quantity: 1 Electronic Signature(s) Signed: 01/17/2023 2:14:26 PM By: Neil Guess MD FACS Entered By: [name] on 01/17/2023 14:14:26 [name] ([id number]) [id number] Page 15 of 15

## 2023-01-18 ENCOUNTER — Encounter (HOSPITAL_BASED_OUTPATIENT_CLINIC_OR_DEPARTMENT_OTHER): Payer: Medicare Other | Admitting: Physical Therapy

## 2023-01-23 ENCOUNTER — Ambulatory Visit (HOSPITAL_BASED_OUTPATIENT_CLINIC_OR_DEPARTMENT_OTHER): Payer: Medicare Other | Admitting: Physical Therapy

## 2023-01-25 ENCOUNTER — Encounter (HOSPITAL_BASED_OUTPATIENT_CLINIC_OR_DEPARTMENT_OTHER): Payer: Medicare Other | Admitting: Physical Therapy

## 2023-01-27 ENCOUNTER — Encounter (HOSPITAL_BASED_OUTPATIENT_CLINIC_OR_DEPARTMENT_OTHER): Payer: Medicare Other | Admitting: General Surgery

## 2023-01-27 DIAGNOSIS — E11621 Type 2 diabetes mellitus with foot ulcer: Secondary | ICD-10-CM | POA: Diagnosis not present

## 2023-01-27 NOTE — Progress Notes (Signed)
HEBERTO, FERENZ ([id number]) [id number] Page 1 of 13 Visit Report for 01/27/2023 Arrival Information Details Patient Name: Date of Service: [facility] [name]. 01/27/2023 8:45 A [name] Medical Record Number: [id number] Patient Account Number: [id number] Date of Birth/Sex: Treating RN: 02/22/1946 (77 y.o. [name] Primary Care Odell Fasching: Vilinda Boehringer, DHA NA [facility] Other Clinician: Referring Rocky Rishel: Treating Diavion Labrador/Extender: Estrella Myrtle SHI, DHA NA [facility] Weeks in Treatment: 2 Visit Information History Since Last Visit Added or deleted any medications: No Patient Arrived: Wheel Chair Any new allergies or adverse reactions: No Arrival Time: 08:29 Had a fall or experienced change in No Accompanied By: spouse activities of daily living that may affect Transfer Assistance: None risk of falls: Patient Identification Verified: Yes Signs or symptoms of abuse/neglect since No Secondary Verification Process Completed: Yes last visito Patient Requires Transmission-Based Precautions: No Hospitalized since last visit: No Patient Has Alerts: No Implantable device outside of the clinic No excluding cellular tissue based products placed in the center since last visit: Has Dressing in Place as Prescribed: Yes Has Footwear/Offloading in Place as Yes Prescribed: Left: Surgical Shoe with Pressure Relief Insole Right: Surgical Shoe with Pressure Relief Insole Pain Present Now: No Electronic Signature(s) Signed: 01/27/2023 12:12:40 PM By: Zenaida Deed RN, BSN Entered By: Zenaida Deed on 01/27/2023 08:34:43 -------------------------------------------------------------------------------- Encounter Discharge Information Details Patient Name: Date of Service: [name]. 01/27/2023 8:45 A [name] Medical Record Number: [id number] Patient Account Number: [id number] Date of Birth/Sex: Treating RN: 1945/09/20 (77 y.o. [name] Primary  Care Vikas Wegmann: Vilinda Boehringer, DHA NA [facility] Other Clinician: Referring Montague Corella: Treating Ankit Degregorio/Extender: Estrella Myrtle SHI, DHA NA [facility] Weeks in Treatment: 2 Encounter Discharge Information Items Post Procedure Vitals Discharge Condition: Stable Temperature (F): 98.7 Ambulatory Status: Wheelchair Pulse (bpm): 59 Discharge Destination: Home Respiratory Rate (breaths/min): 18 Transportation: Private Auto Blood Pressure (mmHg): 126/66 Accompanied By: spouse Schedule Follow-up Appointment: Yes Clinical Summary of Care: Patient Declined Electronic Signature(s) Signed: 01/27/2023 12:12:40 PM By: Zenaida Deed RN, BSN Entered By: Zenaida Deed on 01/27/2023 09:27:05 [name] ([id number]) [id number] Page 2 of 13 -------------------------------------------------------------------------------- Lower Extremity Assessment Details Patient Name: Date of Service: [facility] [name]. 01/27/2023 8:45 A [name] Medical Record Number: [id number] Patient Account Number: [id number] Date of Birth/Sex: Treating RN: [birth year] (77 y.o. [name] Primary Care Tashica Provencio: Vilinda Boehringer, DHA NA [facility] Other Clinician: Referring Kebra Lowrimore: Treating Lynae Pederson/Extender: Estrella Myrtle SHI, DHA NA [facility] Weeks in Treatment: 2 Edema Assessment Assessed: [Left: No] [Right: No] Edema: [Left: Yes] [Right: Yes] Calf Left: Right: Point of Measurement: From Medial Instep 38.5 cm 39.5 cm Ankle Left: Right: Point of Measurement: From Medial Instep 28.1 cm 27 cm Vascular Assessment Pulses: Dorsalis Pedis Palpable: [Left:Yes] [Right:Yes] Extremity colors, hair growth, and conditions: Extremity Color: [Left:Normal] [Right:Normal] Temperature of Extremity: [Left:Warm] [Right:Warm] Capillary Refill: [Left:< 3 seconds] [Right:< 3 seconds] Dependent Rubor: [Left:No No] [Right:No No] Electronic Signature(s) Signed: 01/27/2023 12:12:40 PM By: Zenaida Deed RN,  BSN Entered By: Zenaida Deed on 01/27/2023 08:40:12 -------------------------------------------------------------------------------- Multi Wound Chart Details Patient Name: Date of Service: [name]. 01/27/2023 8:45 A [name] Medical Record Number: [id number] Patient Account Number: [id number] Date of Birth/Sex: Treating RN: [birth year] (77 y.o. [name])) Primary Care Jadira Nierman: Vilinda Boehringer, DHA NA [facility] Other Clinician: Referring Tasman Zapata: Treating Robbert Langlinais/Extender: Estrella Myrtle SHI, DHA NA [facility] Weeks in Treatment: 2 Vital Signs Height(in): 72 Pulse(bpm): 59 Weight(lbs): 240 Blood Pressure(mmHg): 126/66 Body Mass Index(BMI): 32.5 Temperature(F):  98.7 Respiratory Rate(breaths/min): 18 [1:Photos:] [3:No Photos [id number] Page 3 of 13] Left, Lateral Lower Leg Left, Posterior Lower Leg Left, Medial Ankle Wound Location: Surgical Injury Pressure Injury Pressure Injury Wounding Event: Open Surgical Wound Pressure Ulcer Pressure Ulcer Primary Etiology: Cataracts, Sleep Apnea, Hypertension, Cataracts, Sleep Apnea, Hypertension, N/A Comorbid History: Osteoarthritis Osteoarthritis 11/24/2022 12/03/2022 12/03/2022 Date Acquired: 2 2 2  Weeks of Treatment: Open Open Open Wound Status: No No No Wound Recurrence: 0.1x0.1x0.1 3.6x2x0.2 0.3x0.3x0.1 Measurements L x W x D (cm) 0.008 5.655 0.071 A (cm) : rea 0.001 1.131 0.007 Volume (cm) : 99.70% -380.10% 87.10% % Reduction in Area: 99.60% -858.50% 87.30% % Reduction in Volume: Full Thickness Without Exposed Category/Stage III Category/Stage III Classification: Support Structures None Present Medium Small Exudate Amount: N/A Serosanguineous Serosanguineous Exudate Type: N/A red, brown red, brown Exudate Color: Distinct, outline attached Epibole N/A Wound Margin: None Present (0%) Small (1-33%) N/A Granulation Amount: N/A Red, Pink N/A Granulation Quality: None Present (0%) Large (67-100%)  N/A Necrotic Amount: Fascia: No Fat Layer (Subcutaneous Tissue): Yes N/A Exposed Structures: Fat Layer (Subcutaneous Tissue): No Fascia: No Tendon: No Tendon: No Muscle: No Muscle: No Joint: No Joint: No Bone: No Bone: No Large (67-100%) None N/A Epithelialization: No Abnormalities Noted No Abnormalities Noted No Abnormalities Noted Periwound Skin Texture: No Abnormalities Noted No Abnormalities Noted No Abnormalities Noted Periwound Skin Moisture: No Abnormalities Noted No Abnormalities Noted No Abnormalities Noted Periwound Skin Color: No Abnormality No Abnormality N/A Temperature: Wound Number: 4 6 N/A Photos: No Photos No Photos N/A Left, Lateral Foot Right Calcaneus N/A Wound Location: Pressure Injury Pressure Injury N/A Wounding Event: Pressure Ulcer Pressure Ulcer N/A Primary Etiology: N/A N/A N/A Comorbid History: 12/03/2022 12/03/2022 N/A Date Acquired: 2 2 N/A Weeks of Treatment: Open Open N/A Wound Status: No No N/A Wound Recurrence: 0.4x0.4x0.1 0.8x1.4x0.1 N/A Measurements L x W x D (cm) 0.126 0.88 N/A A (cm) : rea 0.013 0.088 N/A Volume (cm) : 77.70% 87.70% N/A % Reduction in A rea: 77.20% 87.70% N/A % Reduction in Volume: Category/Stage III Unstageable/Unclassified N/A Classification: Medium Medium N/A Exudate A mount: Serosanguineous Serosanguineous N/A Exudate Type: red, brown red, brown N/A Exudate Color: N/A N/A N/A Wound Margin: N/A N/A N/A Granulation A mount: N/A N/A N/A Granulation Quality: N/A N/A N/A Necrotic A mount: N/A N/A N/A Exposed Structures: N/A N/A N/A Epithelialization: N/A N/A N/A Temperature: Treatment Notes Electronic Signature(s) Signed: 01/27/2023 8:52:55 AM By: Duanne Guess MD FACS Entered By: Duanne Guess on 01/27/2023 08:52:55 [name] ([id number]) [id number] Page 4 of  13 -------------------------------------------------------------------------------- Multi-Disciplinary Care Plan Details Patient Name: Date of Service: [facility] [name]. 01/27/2023 8:45 A [name] Medical Record Number: [id number] Patient Account Number: [id number] Date of Birth/Sex: Treating RN: 1945/08/02 (77 y.o. [name], [name] Primary Care Bonifacio Pruden: Vilinda Boehringer, DHA NA [facility] Other Clinician: Referring Regene Mccarthy: Treating Burris Matherne/Extender: Estrella Myrtle SHI, DHA NA [facility] Weeks in Treatment: 2 Multidisciplinary Care Plan reviewed with physician Active Inactive Abuse / Safety / Falls / Self Care Management Nursing Diagnoses: Potential for falls Goals: Patient/caregiver will verbalize/demonstrate measures taken to prevent injury and/or falls Date Initiated: 01/09/2023 Target Resolution Date: 02/06/2023 Goal Status: Active Interventions: Assess fall risk on admission and as needed Assess: immobility, friction, shearing, incontinence upon admission and as needed Assess impairment of mobility on admission and as needed per policy Notes: Pressure Nursing Diagnoses: Knowledge deficit related to causes and risk factors for pressure ulcer development Knowledge deficit related to management of pressures ulcers Potential for  impaired tissue integrity related to pressure, friction, moisture, and shear Goals: Patient/caregiver will verbalize understanding of pressure ulcer management Date Initiated: 01/09/2023 Target Resolution Date: 02/06/2023 Goal Status: Active Interventions: Assess: immobility, friction, shearing, incontinence upon admission and as needed Assess offloading mechanisms upon admission and as needed Assess potential for pressure ulcer upon admission and as needed Notes: Wound/Skin Impairment Nursing Diagnoses: Impaired tissue integrity Knowledge deficit related to ulceration/compromised skin integrity Goals: Patient/caregiver will verbalize understanding of skin care  regimen Date Initiated: 01/09/2023 Target Resolution Date: 02/06/2023 Goal Status: Active Ulcer/skin breakdown will have a volume reduction of 30% by week 4 Date Initiated: 01/09/2023 Target Resolution Date: 02/06/2023 Goal Status: Active Interventions: Assess patient/caregiver ability to obtain necessary supplies Assess patient/caregiver ability to perform ulcer/skin care regimen upon admission and as needed Assess ulceration(s) every visit Provide education on ulcer and skin care Treatment Activities: Skin care regimen initiated : 01/09/2023 Topical wound management initiated : 01/09/2023 [name] ([id number]) [id number] Page 5 of 13 Notes: Electronic Signature(s) Signed: 01/27/2023 12:12:40 PM By: Zenaida Deed RN, BSN Entered By: Zenaida Deed on 01/27/2023 08:55:43 -------------------------------------------------------------------------------- Pain Assessment Details Patient Name: Date of Service: [name]. 01/27/2023 8:45 A [name] Medical Record Number: [id number] Patient Account Number: [id number] Date of Birth/Sex: Treating RN: 1946-02-28 (77 y.o. [name] Primary Care Ekansh Sherk: Vilinda Boehringer, DHA NA [facility] Other Clinician: Referring Sayyid Harewood: Treating Markiesha Delia/Extender: Estrella Myrtle SHI, DHA NA [facility] Weeks in Treatment: 2 Active Problems Location of Pain Severity and Description of Pain Patient Has Paino No Site Locations Rate the pain. Current Pain Level: 0 Pain Management and Medication Current Pain Management: Electronic Signature(s) Signed: 01/27/2023 12:12:40 PM By: Zenaida Deed RN, BSN Entered By: Zenaida Deed on 01/27/2023 08:35:22 -------------------------------------------------------------------------------- Patient/Caregiver Education Details Patient Name: Date of Service: [name] 7/26/2024andnbsp8:45 A [name] Medical Record Number: [id number] Patient Account Number: [id number] Date  of Birth/Gender: Treating RN: 11-23-1945 (77 y.o. [name] Primary Care Physician: Vilinda Boehringer, [facility] Other Clinician: Referring Physician: Treating Physician/Extender: Jeni Salles, DHA NA [facility] Weeks in Treatment: 2 Education Assessment EYAL, TANTILLO ([id number]) [id number] Page 6 of 13 Education Provided To: Patient Education Topics Provided Pressure: Methods: Explain/Verbal Responses: Reinforcements needed, State content correctly Wound/Skin Impairment: Methods: Explain/Verbal Responses: Reinforcements needed, State content correctly Electronic Signature(s) Signed: 01/27/2023 12:12:40 PM By: Zenaida Deed RN, BSN Entered By: Zenaida Deed on 01/27/2023 08:56:05 -------------------------------------------------------------------------------- Wound Assessment Details Patient Name: Date of Service: [name]. 01/27/2023 8:45 A [name] Medical Record Number: [id number] Patient Account Number: [id number] Date of Birth/Sex: Treating RN: 1946/05/31 (77 y.o. [name] Primary Care Ruddy Swire: Vilinda Boehringer, DHA NA [facility] Other Clinician: Referring Nevaan Bunton: Treating Gervis Gaba/Extender: Estrella Myrtle SHI, DHA NA [facility] Weeks in Treatment: 2 Wound Status Wound Number: 1 Primary Etiology: Open Surgical Wound Wound Location: Left, Lateral Lower Leg Wound Status: Healed - Epithelialized Wounding Event: Surgical Injury Comorbid History: Cataracts, Sleep Apnea, Hypertension, Osteoarthritis Date Acquired: 11/24/2022 Weeks Of Treatment: 2 Clustered Wound: No Photos Wound Measurements Length: (cm) Width: (cm) Depth: (cm) Area: (cm) Volume: (cm) 0 % Reduction in Area: 100% 0 % Reduction in Volume: 100% 0 Epithelialization: Large (67-100%) 0 Tunneling: No 0 Undermining: No Wound Description Classification: Full Thickness Without Exposed Support Wound Margin: Distinct, outline attached Exudate Amount: None  Present Structures Foul Odor After Cleansing: No Slough/Fibrino No Wound Bed Granulation Amount: None Present (0%) Exposed Structure Necrotic Amount: None Present (0%) Fascia Exposed:  No Fat Layer (Subcutaneous Tissue) Exposed: No Neil, [name] ([id number]) [id number] Page 7 of 13 Tendon Exposed: No Muscle Exposed: No Joint Exposed: No Bone Exposed: No Periwound Skin Texture Texture Color No Abnormalities Noted: Yes No Abnormalities Noted: Yes Moisture Temperature / Pain No Abnormalities Noted: Yes Temperature: No Abnormality Treatment Notes Wound #1 (Lower Leg) Wound Laterality: Left, Lateral Cleanser Peri-Wound Care Topical Primary Dressing Secondary Dressing Secured With Compression Wrap Compression Stockings Add-Ons Electronic Signature(s) Signed: 01/27/2023 12:12:40 PM By: Zenaida Deed RN, BSN Entered By: Zenaida Deed on 01/27/2023 09:02:51 -------------------------------------------------------------------------------- Wound Assessment Details Patient Name: Date of Service: [name]. 01/27/2023 8:45 A [name] Medical Record Number: [id number] Patient Account Number: [id number] Date of Birth/Sex: Treating RN: 08/20/1945 (77 y.o. [name] Primary Care Anastasio Wogan: Vilinda Boehringer, DHA NA [facility] Other Clinician: Referring Carissa Musick: Treating Brently Voorhis/Extender: Estrella Myrtle SHI, DHA NA [facility] Weeks in Treatment: 2 Wound Status Wound Number: 2 Primary Etiology: Pressure Ulcer Wound Location: Left, Posterior Lower Leg Wound Status: Open Wounding Event: Pressure Injury Comorbid History: Cataracts, Sleep Apnea, Hypertension, Osteoarthritis Date Acquired: 12/03/2022 Weeks Of Treatment: 2 Clustered Wound: No Photos Wound Measurements Neil, [name] ([id number]) Length: (cm) 3.6 Width: (cm) 2 Depth: (cm) 0.2 Area: (cm) 5.655 Volume: (cm) 1.131 [id number] Page 8 of 13 % Reduction  in Area: -380.1% % Reduction in Volume: -858.5% Epithelialization: None Tunneling: No Undermining: No Wound Description Classification: Category/Stage III Wound Margin: Epibole Exudate Amount: Medium Exudate Type: Serosanguineous Exudate Color: red, brown Foul Odor After Cleansing: No Slough/Fibrino Yes Wound Bed Granulation Amount: Small (1-33%) Exposed Structure Granulation Quality: Red, Pink Fascia Exposed: No Necrotic Amount: Large (67-100%) Fat Layer (Subcutaneous Tissue) Exposed: Yes Necrotic Quality: Adherent Slough Tendon Exposed: No Muscle Exposed: No Joint Exposed: No Bone Exposed: No Periwound Skin Texture Texture Color No Abnormalities Noted: Yes No Abnormalities Noted: Yes Moisture Temperature / Pain No Abnormalities Noted: Yes Temperature: No Abnormality Treatment Notes Wound #2 (Lower Leg) Wound Laterality: Left, Posterior Cleanser Peri-Wound Care Topical Primary Dressing Hydrofera Blue Ready Transfer Foam, 2.5x2.5 (in/in) Discharge Instruction: Apply directly to wound bed as directed Santyl Ointment Discharge Instruction: Apply nickel thick amount to wound bed as instructed Secondary Dressing Woven Gauze Sponge, Non-Sterile 4x4 in Discharge Instruction: Apply over primary dressing as directed. Secured With Elastic Bandage 4 inch (ACE bandage) Discharge Instruction: Secure with ACE bandage as directed. Kerlix Roll Sterile, 4.5x3.1 (in/yd) Discharge Instruction: Secure with Kerlix as directed. Compression Wrap Compression Stockings Add-Ons Electronic Signature(s) Signed: 01/27/2023 12:12:40 PM By: Zenaida Deed RN, BSN Entered By: Zenaida Deed on 01/27/2023 08:52:37 Wound Assessment Details -------------------------------------------------------------------------------- [name] ([id number]) [id number] Page 9 of 13 Patient Name: Date of Service: [name]. 01/27/2023 8:45 A [name] Medical  Record Number: [id number] Patient Account Number: [id number] Date of Birth/Sex: Treating RN: Dec 14, 1945 (77 y.o. [name] Primary Care Jaelin Fackler: Vilinda Boehringer, DHA NA [facility] Other Clinician: Referring Tejay Hubert: Treating Leanndra Pember/Extender: Estrella Myrtle SHI, DHA NA [facility] Weeks in Treatment: 2 Wound Status Wound Number: 3 Primary Etiology: Pressure Ulcer Wound Location: Left, Medial Ankle Wound Status: Open Wounding Event: Pressure Injury Comorbid History: Cataracts, Sleep Apnea, Hypertension, Osteoarthritis Date Acquired: 12/03/2022 Weeks Of Treatment: 2 Clustered Wound: No Photos Wound Measurements Length: (cm) 0.3 % Reduction in Area: 87.1% Width: (cm) 0.3 % Reduction in Volume: 87.3% Depth: (cm) 0.1 Epithelialization: Medium (34-66%) Area: (cm) 0.071 Tunneling: No Volume: (cm) 0.007 Undermining: No Wound Description Classification: Category/Stage III Foul Odor After Cleansing: No Wound  Margin: Flat and Intact Slough/Fibrino Yes Exudate Amount: Small Exudate Type: Serosanguineous Exudate Color: red, brown Wound Bed Granulation Amount: Small (1-33%) Exposed Structure Granulation Quality: Pink Fascia Exposed: No Necrotic Amount: Large (67-100%) Fat Layer (Subcutaneous Tissue) Exposed: Yes Necrotic Quality: Adherent Slough Tendon Exposed: No Muscle Exposed: No Joint Exposed: No Bone Exposed: No Periwound Skin Texture Texture Color No Abnormalities Noted: Yes No Abnormalities Noted: Yes Moisture Temperature / Pain No Abnormalities Noted: Yes Temperature: No Abnormality Treatment Notes Wound #3 (Ankle) Wound Laterality: Left, Medial Cleanser Peri-Wound Care Topical Primary Dressing Promogran Prisma Matrix, 4.34 (sq in) (silver collagen) Discharge Instruction: Moisten collagen with saline or hydrogel Secondary Dressing Woven Gauze Sponge, Non-Sterile 4x4 in Neil, [name] ([id number]) [id number] Page 10 of 13 Discharge  Instruction: Apply over primary dressing as directed. Secured With Elastic Bandage 4 inch (ACE bandage) Discharge Instruction: Secure with ACE bandage as directed. Kerlix Roll Sterile, 4.5x3.1 (in/yd) Discharge Instruction: Secure with Kerlix as directed. Compression Wrap Compression Stockings Add-Ons Electronic Signature(s) Signed: 01/27/2023 12:12:40 PM By: Zenaida Deed RN, BSN Entered By: Zenaida Deed on 01/27/2023 08:53:14 -------------------------------------------------------------------------------- Wound Assessment Details Patient Name: Date of Service: [name]. 01/27/2023 8:45 A [name] Medical Record Number: [id number] Patient Account Number: [id number] Date of Birth/Sex: Treating RN: 12-11-1945 (77 y.o. [name] Primary Care Micheal Sheen: Vilinda Boehringer, DHA NA [facility] Other Clinician: Referring Rosalie Gelpi: Treating Joron Velis/Extender: Estrella Myrtle SHI, DHA NA [facility] Weeks in Treatment: 2 Wound Status Wound Number: 4 Primary Etiology: Pressure Ulcer Wound Location: Left, Lateral Foot Wound Status: Open Wounding Event: Pressure Injury Comorbid History: Cataracts, Sleep Apnea, Hypertension, Osteoarthritis Date Acquired: 12/03/2022 Weeks Of Treatment: 2 Clustered Wound: No Photos Wound Measurements Length: (cm) 0.4 Width: (cm) 0.4 Depth: (cm) 0.1 Area: (cm) 0.126 Volume: (cm) 0.013 % Reduction in Area: 77.7% % Reduction in Volume: 77.2% Epithelialization: Small (1-33%) Tunneling: No Undermining: No Wound Description Classification: Category/Stage III Wound Margin: Flat and Intact Exudate Amount: Small Exudate Type: Serosanguineous Exudate Color: red, brown Foul Odor After Cleansing: No Slough/Fibrino Yes Wound Bed Granulation Amount: Medium (34-66%) Exposed Structure Granulation Quality: Red, Pink Fascia Exposed: No Neil, [name] ([id number]) [id number] Page 11 of 13 Necrotic Amount: Medium (34-66%) Fat  Layer (Subcutaneous Tissue) Exposed: Yes Necrotic Quality: Adherent Slough Tendon Exposed: No Muscle Exposed: No Joint Exposed: No Bone Exposed: No Periwound Skin Texture Texture Color No Abnormalities Noted: Yes No Abnormalities Noted: Yes Moisture Temperature / Pain No Abnormalities Noted: Yes Temperature: No Abnormality Tenderness on Palpation: Yes Treatment Notes Wound #4 (Foot) Wound Laterality: Left, Lateral Cleanser Peri-Wound Care Topical Primary Dressing Promogran Prisma Matrix, 4.34 (sq in) (silver collagen) Discharge Instruction: Moisten collagen with saline or hydrogel Secondary Dressing Woven Gauze Sponge, Non-Sterile 4x4 in Discharge Instruction: Apply over primary dressing as directed. Secured With Elastic Bandage 4 inch (ACE bandage) Discharge Instruction: Secure with ACE bandage as directed. Kerlix Roll Sterile, 4.5x3.1 (in/yd) Discharge Instruction: Secure with Kerlix as directed. Compression Wrap Compression Stockings Add-Ons Electronic Signature(s) Signed: 01/27/2023 12:12:40 PM By: Zenaida Deed RN, BSN Entered By: Zenaida Deed on 01/27/2023 08:54:14 -------------------------------------------------------------------------------- Wound Assessment Details Patient Name: Date of Service: [name]. 01/27/2023 8:45 A [name] Medical Record Number: [id number] Patient Account Number: [id number] Date of Birth/Sex: Treating RN: [birth year] (77 y.o. [name] Primary Care Channie Bostick: Vilinda Boehringer, DHA NA [facility] Other Clinician: Referring Tierre Netto: Treating Kathrine Rieves/Extender: Estrella Myrtle SHI, DHA NA [facility] Weeks in Treatment: 2 Wound Status Wound Number: 6 Primary Etiology: Pressure Ulcer  Wound Location: Right Calcaneus Wound Status: Open Wounding Event: Pressure Injury Comorbid History: Cataracts, Sleep Apnea, Hypertension, Osteoarthritis Date Acquired: 12/03/2022 Weeks Of Treatment: 2 Clustered Wound: No Photos Neil, [name] ([id number]) [id number] Page 12 of 13 Wound Measurements Length: (cm) 0.8 Width: (cm) 1.4 Depth: (cm) 0.1 Area: (cm) 0.88 Volume: (cm) 0.088 % Reduction in Area: 87.7% % Reduction in Volume: 87.7% Epithelialization: Small (1-33%) Tunneling: No Undermining: No Wound Description Classification: Category/Stage III Wound Margin: Flat and Intact Exudate Amount: Small Exudate Type: Serosanguineous Exudate Color: red, brown Foul Odor After Cleansing: No Slough/Fibrino Yes Wound Bed Granulation Amount: Large (67-100%) Exposed Structure Granulation Quality: Red Fascia Exposed: No Necrotic Amount: Small (1-33%) Fat Layer (Subcutaneous Tissue) Exposed: Yes Necrotic Quality: Adherent Slough Tendon Exposed: No Muscle Exposed: No Joint Exposed: No Bone Exposed: No Periwound Skin Texture Texture Color No Abnormalities Noted: Yes No Abnormalities Noted: Yes Moisture Temperature / Pain No Abnormalities Noted: No Temperature: No Abnormality Dry / Scaly: Yes Treatment Notes Wound #6 (Calcaneus) Wound Laterality: Right Cleanser Peri-Wound Care Topical Primary Dressing Promogran Prisma Matrix, 4.34 (sq in) (silver collagen) Discharge Instruction: Moisten collagen with saline or hydrogel Secondary Dressing Woven Gauze Sponge, Non-Sterile 4x4 in Discharge Instruction: Apply over primary dressing as directed. Secured With Elastic Bandage 4 inch (ACE bandage) Discharge Instruction: Secure with ACE bandage as directed. Kerlix Roll Sterile, 4.5x3.1 (in/yd) Discharge Instruction: Secure with Kerlix as directed. Compression Wrap Compression Stockings Add-Ons Electronic Signature(s) Signed: 01/27/2023 12:12:40 PM By: Zenaida Deed RN, BSN [name] 12:12:40 PM By: Zenaida Deed RN, BSN Signed: 01/27/2023 ([id number]) [phone].pdf Page 13 of 13 Entered By: Zenaida Deed on 01/27/2023  08:55:03 -------------------------------------------------------------------------------- Vitals Details Patient Name: Date of Service: [facility] [name]. 01/27/2023 8:45 A [name] Medical Record Number: [id number] Patient Account Number: [id number] Date of Birth/Sex: Treating RN: 1946-04-05 (77 y.o. [name] Primary Care Cortny Bambach: Vilinda Boehringer, DHA NA [facility] Other Clinician: Referring Maisha Bogen: Treating Maryclaire Stoecker/Extender: Estrella Myrtle SHI, DHA NA [facility] Weeks in Treatment: 2 Vital Signs Time Taken: 08:34 Temperature (F): 98.7 Height (in): 72 Pulse (bpm): 59 Weight (lbs): 240 Respiratory Rate (breaths/min): 18 Body Mass Index (BMI): 32.5 Blood Pressure (mmHg): 126/66 Reference Range: 80 - 120 mg / dl Electronic Signature(s) Signed: 01/27/2023 12:12:40 PM By: Zenaida Deed RN, BSN Entered By: Zenaida Deed on 01/27/2023 08:35:07

## 2023-01-27 NOTE — Progress Notes (Addendum)
Neil [name] ([id number]) [id number] Page 1 of 12 Visit Report for 01/27/2023 Chief Complaint Document Details Patient Name: Date of Service: [facility] Neil [name]. 01/27/2023 8:45 A [name] Medical Record Number: [id number] Patient Account Number: [id number] Date of Birth/Sex: Treating RN: 10-Mar-1946 (77 y.o. [name]) Primary Care Provider: Vilinda Boehringer, DHA NA [facility] Other Clinician: Referring Provider: Treating Provider/Extender: Estrella Myrtle SHI, DHA NA Neil [name] Weeks in Treatment: 2 Information Obtained from: Patient Chief Complaint Patient presents to the wound care center with open non-healing surgical wound(s) and pressure ulcers Electronic Signature(s) Signed: 01/27/2023 8:53:07 AM By: Duanne Guess MD FACS Entered By: Duanne Guess on 01/27/2023 08:53:07 -------------------------------------------------------------------------------- Debridement Details Patient Name: Date of Service: Neil [name]. 01/27/2023 8:45 A [name] Medical Record Number: [id number] Patient Account Number: [id number] Date of Birth/Sex: Treating RN: 11-Sep-1945 (77 y.o. Neil [name] Primary Care Provider: Vilinda Boehringer, DHA NA [facility] Other Clinician: Referring Provider: Treating Provider/Extender: Estrella Myrtle SHI, DHA NA Neil [name] Weeks in Treatment: 2 Debridement Performed for Assessment: Wound #4 Left,Lateral Foot Performed By: Physician Duanne Guess, MD Debridement Type: Debridement Level of Consciousness (Pre-procedure): Awake and Alert Pre-procedure Verification/Time Out Yes - 09:00 Taken: Start Time: 09:00 Pain Control: Lidocaine 4% T opical Solution Percent of Wound Bed Debrided: 100% T Area Debrided (cm): otal 0.13 Tissue and other material debrided: Non-Viable, Slough, Slough Level: Non-Viable Tissue Debridement Description: Selective/Open Wound Instrument: Curette Bleeding: Minimum Hemostasis Achieved: Pressure Procedural Pain: 0 Post  Procedural Pain: 0 Response to Treatment: Procedure was tolerated well Level of Consciousness (Post- Awake and Alert procedure): Post Debridement Measurements of Total Wound Length: (cm) 0.4 Stage: Category/Stage III Width: (cm) 0.4 Depth: (cm) 0.1 Volume: (cm) 0.013 Character of Wound/Ulcer Post Debridement: Improved Post Procedure Diagnosis Neil [name] ([id number]) [id number] Page 2 of 12 Same as Pre-procedure Notes : Scribed for Dr. Lady Gary by Zenaida Deed, RN Electronic Signature(s) Signed: 01/27/2023 9:17:42 AM By: Duanne Guess MD FACS Signed: 01/27/2023 12:12:40 PM By: Zenaida Deed RN, BSN Entered By: Zenaida Deed on 01/27/2023 09:04:46 -------------------------------------------------------------------------------- Debridement Details Patient Name: Date of Service: Neil [name]. 01/27/2023 8:45 A [name] Medical Record Number: [id number] Patient Account Number: [id number] Date of Birth/Sex: Treating RN: 1945/09/21 (77 y.o. Neil [name] Primary Care Provider: Vilinda Boehringer, DHA NA [facility] Other Clinician: Referring Provider: Treating Provider/Extender: Estrella Myrtle SHI, DHA NA Neil [name] Weeks in Treatment: 2 Debridement Performed for Assessment: Wound #6 Right Calcaneus Performed By: Physician Duanne Guess, MD Debridement Type: Debridement Level of Consciousness (Pre-procedure): Awake and Alert Pre-procedure Verification/Time Out Yes - 09:00 Taken: Start Time: 09:00 Pain Control: Lidocaine 4% T opical Solution Percent of Wound Bed Debrided: 100% T Area Debrided (cm): otal 0.88 Tissue and other material debrided: Viable, Non-Viable, Slough, Subcutaneous, Slough Level: Skin/Subcutaneous Tissue Debridement Description: Excisional Instrument: Curette Bleeding: Minimum Hemostasis Achieved: Pressure Procedural Pain: 0 Post Procedural Pain: 0 Response to Treatment: Procedure was tolerated well Level of  Consciousness (Post- Awake and Alert procedure): Post Debridement Measurements of Total Wound Length: (cm) 0.8 Stage: Category/Stage III Width: (cm) 1.4 Depth: (cm) 0.2 Volume: (cm) 0.176 Character of Wound/Ulcer Post Debridement: Improved Post Procedure Diagnosis Same as Pre-procedure Notes : Scribed for Dr. Lady Gary by Zenaida Deed, RN Electronic Signature(s) Signed: 01/27/2023 9:17:42 AM By: Duanne Guess MD FACS Signed: 01/27/2023 12:12:40 PM By: Zenaida Deed RN, BSN Entered By: Zenaida Deed on 01/27/2023 09:05:18 Neil [name] ([id number]) [id number] Page 3 of 12 -------------------------------------------------------------------------------- Debridement Details Patient Name: Date of Service:  [facility] Rockne Coons [name] [name]. 01/27/2023 8:45 A [name] Medical Record Number: [id number] Patient Account Number: [id number] Date of Birth/Sex: Treating RN: [birth year] (77 y.o. Neil [name] Primary Care Provider: Vilinda Boehringer, DHA NA [facility] Other Clinician: Referring Provider: Treating Provider/Extender: Estrella Myrtle SHI, DHA NA Neil [name] Weeks in Treatment: 2 Debridement Performed for Assessment: Wound #2 Left,Posterior Lower Leg Performed By: Physician Duanne Guess, MD Debridement Type: Debridement Level of Consciousness (Pre-procedure): Awake and Alert Pre-procedure Verification/Time Out Yes - 09:00 Taken: Start Time: 09:00 Pain Control: Lidocaine 4% T opical Solution Percent of Wound Bed Debrided: 100% T Area Debrided (cm): otal 5.65 Tissue and other material debrided: Non-Viable, Slough, Slough Level: Non-Viable Tissue Debridement Description: Selective/Open Wound Instrument: Curette Bleeding: Minimum Hemostasis Achieved: Pressure Procedural Pain: 0 Post Procedural Pain: 0 Response to Treatment: Procedure was tolerated well Level of Consciousness (Post- Awake and Alert procedure): Post Debridement Measurements of Total  Wound Length: (cm) 3.6 Stage: Category/Stage III Width: (cm) 2 Depth: (cm) 0.2 Volume: (cm) 1.131 Character of Wound/Ulcer Post Debridement: Improved Post Procedure Diagnosis Same as Pre-procedure Notes : Scribed for Dr. Lady Gary by Zenaida Deed, RN Electronic Signature(s) Signed: 01/27/2023 9:17:42 AM By: Duanne Guess MD FACS Signed: 01/27/2023 12:12:40 PM By: Zenaida Deed RN, BSN Entered By: Zenaida Deed on 01/27/2023 09:06:21 -------------------------------------------------------------------------------- HPI Details Patient Name: Date of Service: Neil [name]. 01/27/2023 8:45 A [name] Medical Record Number: [id number] Patient Account Number: [id number] Date of Birth/Sex: Treating RN: [birth year] (77 y.o. [name]) Primary Care Provider: Vilinda Boehringer, DHA NA [facility] Other Clinician: Referring Provider: Treating Provider/Extender: Estrella Myrtle SHI, DHA NA Neil [name] Weeks in Treatment: 2 History of Present Illness HPI Description: ADMISSION 01/09/2023 This is a 77 year old known diabetic who underwent a left ankle ORIF on Nov 26, 2022. He was seen by orthopedic surgery for follow-up on December 26, 2022 and was noted to have breakdown of his left lateral leg incision. He was also given a course of Keflex at that time. He also developed pressure ulcers on his posterior Achilles area, his lateral calcaneus, and his medial malleolus from the boot he was wearing. He has a pressure ulcer on his right heel, as well. He was referred to the wound care center for further evaluation and management. 01/17/2023: All of the wounds are little bit smaller today with the exception of the one over his posterior Achilles on the left. This wound is bigger and has a SRIHAAS, REIDY ([id number]) [id number] Page 4 of 12 fibrous layer to it that is densely adherent. There is slough accumulation on all of the wounds. He says he has not been using the Prevalon boot because  he frequently has to get up and urinate but he does report that he is floating his heels on pillows. 01/27/2023: The surgical incision site is healed. His medial ankle wound is down to just a couple of millimeters. His lateral left heel wound is also quite a bit smaller. There is minimal slough accumulation on this wound. The Achilles area on the left is still about the same size with thick, fibrotic, adherent non-viable tissue. The wound on his right heel is smaller. There is still some depth at the center with slough accumulation. Electronic Signature(s) Signed: 01/27/2023 9:10:22 AM By: Duanne Guess MD FACS Entered By: Duanne Guess on 01/27/2023 09:10:22 -------------------------------------------------------------------------------- Physical Exam Details Patient Name: Date of Service: Neil [name]. 01/27/2023 8:45 A [name] Medical Record Number: [id number] Patient Account Number: [id number] Date of Birth/Sex:  Treating RN: 04-Feb-1946 (77 y.o. [name]) Primary Care Provider: Vilinda Boehringer, DHA NA [facility] Other Clinician: Referring Provider: Treating Provider/Extender: Estrella Myrtle SHI, DHA NA Neil [name] Weeks in Treatment: 2 Constitutional . Slightly bradycardic. . . no acute distress. Respiratory Normal work of breathing on room air. Notes 01/27/2023: The surgical incision site is healed. His medial ankle wound is down to just a couple of millimeters. His lateral left heel wound is also quite a bit smaller. There is minimal slough accumulation on this wound. The Achilles area on the left is still about the same size with thick, fibrotic, adherent non-viable tissue. The wound on his right heel is smaller. There is still some depth at the center with slough accumulation. Electronic Signature(s) Signed: 01/27/2023 9:11:18 AM By: Duanne Guess MD FACS Entered By: Duanne Guess on 01/27/2023  09:11:18 -------------------------------------------------------------------------------- Physician Orders Details Patient Name: Date of Service: Neil [name]. 01/27/2023 8:45 A [name] Medical Record Number: [id number] Patient Account Number: [id number] Date of Birth/Sex: Treating RN: Dec 28, 1945 (77 y.o. Neil [name] Primary Care Provider: Vilinda Boehringer, DHA NA [facility] Other Clinician: Referring Provider: Treating Provider/Extender: Estrella Myrtle SHI, DHA NA Neil [name] Weeks in Treatment: 2 Verbal / Phone Orders: No Diagnosis Coding ICD-10 Coding Code Description L97.322 Non-pressure chronic ulcer of left ankle with fat layer exposed L89.623 Pressure ulcer of left heel, stage 3 L89.523 Pressure ulcer of left ankle, stage 3 L89.613 Pressure ulcer of right heel, stage 3 G62.9 Polyneuropathy, unspecified Follow-up Appointments ppointment in 1 week. - Dr. Lady Gary RM 1 Return A CHISUM, WHEELAND ([id number]) [id number] Page 5 of 12 Tuesday 7/30 @ 11:15 am Anesthetic Wound #2 Left,Posterior Lower Leg (In clinic) Topical Lidocaine 4% applied to wound bed Wound #3 Left,Medial Ankle (In clinic) Topical Lidocaine 4% applied to wound bed Wound #4 Left,Lateral Foot (In clinic) Topical Lidocaine 4% applied to wound bed Wound #6 Right Calcaneus (In clinic) Topical Lidocaine 4% applied to wound bed Bathing/ Shower/ Hygiene May shower and wash wound with soap and water. Off-Loading DH Walker Boot to: - left foot per ortho Prevalon Boot - Prevalon boot to right foot especially while in bed Other: - Avoid pressure on the posterior (back ) of left lower leg Home Health No change in wound care orders this week; continue Home Health for wound care. May utilize formulary equivalent dressing for wound treatment orders unless otherwise specified. Dressing changes to be completed by Home Health on Monday / Wednesday / Friday except when patient has scheduled visit  [facility]. Other Home Health Orders/Instructions: - Verna Czech fax- [phone] Wound Treatment Wound #2 - Lower Leg Wound Laterality: Left, Posterior Prim Dressing: Hydrofera Blue Ready Transfer Foam, 2.5x2.5 (in/in) 3 x Per Week/30 Days ary Discharge Instructions: Apply directly to wound bed as directed Prim Dressing: Santyl Ointment 3 x Per Week/30 Days ary Discharge Instructions: Apply nickel thick amount to wound bed as instructed Secondary Dressing: Woven Gauze Sponge, Non-Sterile 4x4 in 3 x Per Week/30 Days Discharge Instructions: Apply over primary dressing as directed. Secured With: Elastic Bandage 4 inch (ACE bandage) 3 x Per Week/30 Days Discharge Instructions: Secure with ACE bandage as directed. Secured With: [employer], 4.5x3.1 (in/yd) 3 x Per Week/30 Days Discharge Instructions: Secure with Kerlix as directed. Wound #3 - Ankle Wound Laterality: Left, Medial Prim Dressing: Promogran Prisma Matrix, 4.34 (sq in) (silver collagen) 3 x Per Week/30 Days ary Discharge Instructions: Moisten collagen with saline or hydrogel Secondary Dressing: Woven Gauze Sponge,  Non-Sterile 4x4 in 3 x Per Week/30 Days Discharge Instructions: Apply over primary dressing as directed. Secured With: Elastic Bandage 4 inch (ACE bandage) 3 x Per Week/30 Days Discharge Instructions: Secure with ACE bandage as directed. Secured With: [employer], 4.5x3.1 (in/yd) 3 x Per Week/30 Days Discharge Instructions: Secure with Kerlix as directed. Wound #4 - Foot Wound Laterality: Left, Lateral Prim Dressing: Promogran Prisma Matrix, 4.34 (sq in) (silver collagen) 3 x Per Week/30 Days ary Discharge Instructions: Moisten collagen with saline or hydrogel Secondary Dressing: Woven Gauze Sponge, Non-Sterile 4x4 in 3 x Per Week/30 Days Discharge Instructions: Apply over primary dressing as directed. Secured With: Elastic Bandage 4 inch (ACE bandage) 3 x Per Week/30  Days Discharge Instructions: Secure with ACE bandage as directed. Secured With: [employer], 4.5x3.1 (in/yd) 3 x Per Week/30 Days Discharge Instructions: Secure with Kerlix as directed. Wound #6 - Calcaneus Wound Laterality: Right Prim Dressing: Promogran Prisma Matrix, 4.34 (sq in) (silver collagen) 3 x Per Week/30 Days ary Discharge Instructions: Moisten collagen with saline or hydrogel Secondary Dressing: Woven Gauze Sponge, Non-Sterile 4x4 in 3 x Per Week/30 Days ZAYVIAN, GEWIRTZ ([id number]) [id number] Page 6 of 12 Discharge Instructions: Apply over primary dressing as directed. Secured With: Elastic Bandage 4 inch (ACE bandage) 3 x Per Week/30 Days Discharge Instructions: Secure with ACE bandage as directed. Secured With: [employer], 4.5x3.1 (in/yd) 3 x Per Week/30 Days Discharge Instructions: Secure with Kerlix as directed. Electronic Signature(s) Signed: 01/27/2023 9:17:42 AM By: Duanne Guess MD FACS Entered By: Duanne Guess on 01/27/2023 09:12:01 -------------------------------------------------------------------------------- Problem List Details Patient Name: Date of Service: Neil [name]. 01/27/2023 8:45 A [name] Medical Record Number: [id number] Patient Account Number: [id number] Date of Birth/Sex: Treating RN: 10/11/1945 (77 y.o. [name]) Primary Care Provider: Vilinda Boehringer, DHA NA [facility] Other Clinician: Referring Provider: Treating Provider/Extender: Estrella Myrtle SHI, DHA NA Neil [name] Weeks in Treatment: 2 Active Problems ICD-10 Encounter Code Description Active Date MDM Diagnosis L97.322 Non-pressure chronic ulcer of left ankle with fat layer exposed 01/09/2023 No Yes L89.623 Pressure ulcer of left heel, stage 3 01/09/2023 No Yes L89.523 Pressure ulcer of left ankle, stage 3 01/09/2023 No Yes L89.613 Pressure ulcer of right heel, stage 3 01/09/2023 No Yes G62.9 Polyneuropathy, unspecified 01/09/2023 No Yes Inactive  Problems Resolved Problems Electronic Signature(s) Signed: 01/27/2023 8:52:42 AM By: Duanne Guess MD FACS Entered By: Duanne Guess on 01/27/2023 08:52:42 Neil [name] ([id number]) [id number] Page 7 of 12 -------------------------------------------------------------------------------- Progress Note Details Patient Name: Date of Service: [name] Neil [name]. 01/27/2023 8:45 A [name] Medical Record Number: [id number] Patient Account Number: [id number] Date of Birth/Sex: Treating RN: 1946-01-01 (77 y.o. [name]) Primary Care Provider: Vilinda Boehringer, DHA NA [facility] Other Clinician: Referring Provider: Treating Provider/Extender: Estrella Myrtle SHI, DHA NA Neil [name] Weeks in Treatment: 2 Subjective Chief Complaint Information obtained from Patient Patient presents to the wound care center with open non-healing surgical wound(s) and pressure ulcers History of Present Illness (HPI) ADMISSION 01/09/2023 This is a 77 year old known diabetic who underwent a left ankle ORIF on Nov 26, 2022. He was seen by orthopedic surgery for follow-up on December 26, 2022 and was noted to have breakdown of his left lateral leg incision. He was also given a course of Keflex at that time. He also developed pressure ulcers on his posterior Achilles area, his lateral calcaneus, and his medial malleolus from the boot he was wearing. He has a pressure ulcer on his right heel, as well.  He was referred to the wound care center for further evaluation and management. 01/17/2023: All of the wounds are little bit smaller today with the exception of the one over his posterior Achilles on the left. This wound is bigger and has a fibrous layer to it that is densely adherent. There is slough accumulation on all of the wounds. He says he has not been using the Prevalon boot because he frequently has to get up and urinate but he does report that he is floating his heels on pillows. 01/27/2023: The surgical  incision site is healed. His medial ankle wound is down to just a couple of millimeters. His lateral left heel wound is also quite a bit smaller. There is minimal slough accumulation on this wound. The Achilles area on the left is still about the same size with thick, fibrotic, adherent non-viable tissue. The wound on his right heel is smaller. There is still some depth at the center with slough accumulation. Patient History Information obtained from Patient, Caregiver, Chart. Family History Heart Disease - Mother,Father, Hypertension - Mother,Father, Stroke - Father, No family history of Cancer, Diabetes, Hereditary Spherocytosis, Kidney Disease, Lung Disease, Seizures, Thyroid Problems, Tuberculosis. Social History Never smoker, Marital Status - Married, Alcohol Use - Rarely, Drug Use - No History, Caffeine Use - Daily - coffee. Medical History Eyes Patient has history of Cataracts - bil extractions Denies history of Glaucoma, Optic Neuritis Ear/Nose/Mouth/Throat Denies history of Chronic sinus problems/congestion, Middle ear problems Respiratory Patient has history of Sleep Apnea Cardiovascular Patient has history of Hypertension Endocrine Denies history of Type I Diabetes, Type II Diabetes Genitourinary Denies history of End Stage Renal Disease Immunological Denies history of Lupus Erythematosus, Raynauds, Scleroderma Integumentary (Skin) Denies history of History of Burn Musculoskeletal Patient has history of Osteoarthritis Denies history of Gout, Rheumatoid Arthritis, Osteomyelitis Neurologic Denies history of Dementia, Neuropathy, Quadriplegia, Paraplegia, Seizure Disorder Oncologic Denies history of Received Chemotherapy, Received Radiation Psychiatric Denies history of Anorexia/bulimia, Confinement Anxiety Hospitalization/Surgery History - bil knee replacements. - ORIF left ankel 5/23 2024. - cholecystectomy. - cervical fusion. Medical A Surgical History  Notes nd Respiratory hx aspiration PNA, asbestosis Gastrointestinal duodenitis, constipation Musculoskeletal ankylosing spondylitis, compression fx T10 Neil [name], Neil [name] ([id number]) [id number] Page 8 of 12 Objective Constitutional Slightly bradycardic. no acute distress. Vitals Time Taken: 8:34 AM, Height: 72 in, Weight: 240 lbs, BMI: 32.5, Temperature: 98.7 F, Pulse: 59 bpm, Respiratory Rate: 18 breaths/min, Blood Pressure: 126/66 mmHg. Respiratory Normal work of breathing on room air. General Notes: 01/27/2023: The surgical incision site is healed. His medial ankle wound is down to just a couple of millimeters. His lateral left heel wound is also quite a bit smaller. There is minimal slough accumulation on this wound. The Achilles area on the left is still about the same size with thick, fibrotic, adherent non-viable tissue. The wound on his right heel is smaller. There is still some depth at the center with slough accumulation. Integumentary (Hair, Skin) Wound #1 status is Healed - Epithelialized. Original cause of wound was Surgical Injury. The date acquired was: 11/24/2022. The wound has been in treatment 2 weeks. The wound is located on the Left,Lateral Lower Leg. The wound measures 0cm length x 0cm width x 0cm depth; 0cm^2 area and 0cm^3 volume. There is no tunneling or undermining noted. There is a none present amount of drainage noted. The wound margin is distinct with the outline attached to the wound base. There is no granulation within the wound bed. There is no necrotic  tissue within the wound bed. The periwound skin appearance had no abnormalities noted for texture. The periwound skin appearance had no abnormalities noted for moisture. The periwound skin appearance had no abnormalities noted for color. Periwound temperature was noted as No Abnormality. Wound #2 status is Open. Original cause of wound was Pressure Injury. The date acquired was:  12/03/2022. The wound has been in treatment 2 weeks. The wound is located on the Left,Posterior Lower Leg. The wound measures 3.6cm length x 2cm width x 0.2cm depth; 5.655cm^2 area and 1.131cm^3 volume. There is Fat Layer (Subcutaneous Tissue) exposed. There is no tunneling or undermining noted. There is a medium amount of serosanguineous drainage noted. The wound margin is epibole. There is small (1-33%) red, pink granulation within the wound bed. There is a large (67-100%) amount of necrotic tissue within the wound bed including Adherent Slough. The periwound skin appearance had no abnormalities noted for texture. The periwound skin appearance had no abnormalities noted for moisture. The periwound skin appearance had no abnormalities noted for color. Periwound temperature was noted as No Abnormality. Wound #3 status is Open. Original cause of wound was Pressure Injury. The date acquired was: 12/03/2022. The wound has been in treatment 2 weeks. The wound is located on the Left,Medial Ankle. The wound measures 0.3cm length x 0.3cm width x 0.1cm depth; 0.071cm^2 area and 0.007cm^3 volume. There is Fat Layer (Subcutaneous Tissue) exposed. There is no tunneling or undermining noted. There is a small amount of serosanguineous drainage noted. The wound margin is flat and intact. There is small (1-33%) pink granulation within the wound bed. There is a large (67-100%) amount of necrotic tissue within the wound bed including Adherent Slough. The periwound skin appearance had no abnormalities noted for texture. The periwound skin appearance had no abnormalities noted for moisture. The periwound skin appearance had no abnormalities noted for color. Periwound temperature was noted as No Abnormality. Wound #4 status is Open. Original cause of wound was Pressure Injury. The date acquired was: 12/03/2022. The wound has been in treatment 2 weeks. The wound is located on the Left,Lateral Foot. The wound measures 0.4cm length  x 0.4cm width x 0.1cm depth; 0.126cm^2 area and 0.013cm^3 volume. There is Fat Layer (Subcutaneous Tissue) exposed. There is no tunneling or undermining noted. There is a small amount of serosanguineous drainage noted. The wound margin is flat and intact. There is medium (34-66%) red, pink granulation within the wound bed. There is a medium (34-66%) amount of necrotic tissue within the wound bed including Adherent Slough. The periwound skin appearance had no abnormalities noted for texture. The periwound skin appearance had no abnormalities noted for moisture. The periwound skin appearance had no abnormalities noted for color. Periwound temperature was noted as No Abnormality. The periwound has tenderness on palpation. Wound #6 status is Open. Original cause of wound was Pressure Injury. The date acquired was: 12/03/2022. The wound has been in treatment 2 weeks. The wound is located on the Right Calcaneus. The wound measures 0.8cm length x 1.4cm width x 0.1cm depth; 0.88cm^2 area and 0.088cm^3 volume. There is Fat Layer (Subcutaneous Tissue) exposed. There is no tunneling or undermining noted. There is a small amount of serosanguineous drainage noted. The wound margin is flat and intact. There is large (67-100%) red granulation within the wound bed. There is a small (1-33%) amount of necrotic tissue within the wound bed including Adherent Slough. The periwound skin appearance had no abnormalities noted for texture. The periwound skin appearance had no abnormalities noted  for color. The periwound skin appearance exhibited: Dry/Scaly. Periwound temperature was noted as No Abnormality. 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 There was a Selective/Open Wound Non-Viable Tissue Debridement with a total area of 5.65 sq cm performed by Duanne Guess, MD. With the following instrument(s): Curette to remove Non-Viable tissue/material. Material removed includes [facility] after achieving pain control using Lidocaine 4% Topical Solution. No specimens were taken. A time out was conducted at 09:00, prior to the start of the procedure. A Minimum amount of bleeding was controlled with Pressure. The procedure was tolerated well with a pain level of 0 throughout and a pain level of 0 following the procedure. Post Debridement Measurements: 3.6cm length x 2cm width x 0.2cm depth; 1.131cm^3 volume. Post debridement Stage noted as Category/Stage III. Character of Wound/Ulcer Post Debridement is improved. Post procedure Diagnosis Wound #2: Same as Pre-Procedure General Notes: : Scribed for Dr. Lady Gary by Zenaida Deed, RN. Neil [name], Neil [name] ([id number]) [id number] Page 9 of 12 Wound #4 Pre-procedure diagnosis of Wound #4 is a Pressure Ulcer located on the Left,Lateral Foot . There was a Selective/Open Wound Non-Viable Tissue Debridement with a total area of 0.13 sq cm performed by Duanne Guess, MD. With the following instrument(s): Curette to remove Non-Viable tissue/material. Material removed includes [facility] after achieving pain control using Lidocaine 4% Topical Solution. No specimens were taken. A time out was conducted at 09:00, prior to the start of the procedure. A Minimum amount of bleeding was controlled with Pressure. The procedure was tolerated well with a pain level of 0 throughout and a pain level of 0 following the procedure. Post Debridement Measurements: 0.4cm length x 0.4cm width x 0.1cm depth; 0.013cm^3 volume. Post debridement Stage noted as Category/Stage III. Character of Wound/Ulcer Post Debridement is improved. Post procedure Diagnosis Wound  #4: Same as Pre-Procedure General Notes: : Scribed for Dr. Lady Gary by Zenaida Deed, RN. Wound #6 Pre-procedure diagnosis of Wound #6 is a Pressure Ulcer located on the Right Calcaneus . There was a Excisional Skin/Subcutaneous Tissue Debridement with a total area of 0.88 sq cm performed by Duanne Guess, MD. With the following instrument(s): Curette to remove Viable and Non-Viable tissue/material. Material removed includes Subcutaneous Tissue and Slough and after achieving pain control using Lidocaine 4% T opical Solution. No specimens were taken. A time out was conducted at 09:00, prior to the start of the procedure. A Minimum amount of bleeding was controlled with Pressure. The procedure was tolerated well with a pain level of 0 throughout and a pain level of 0 following the procedure. Post Debridement Measurements: 0.8cm length x 1.4cm width x 0.2cm depth; 0.176cm^3 volume. Post debridement Stage noted as Category/Stage III. Character of Wound/Ulcer Post Debridement is improved. Post procedure Diagnosis Wound #6: Same as Pre-Procedure General Notes: : Scribed for Dr. Lady Gary by Zenaida Deed, RN. Plan Follow-up Appointments: Return Appointment in 1 week. - Dr. Lady Gary RM 1 Tuesday 7/30 @ 11:15 am Anesthetic: Wound #2 Left,Posterior Lower Leg: (In clinic) Topical Lidocaine 4% applied to wound bed Wound #3 Left,Medial Ankle: (In clinic) Topical Lidocaine 4% applied to wound bed Wound #4 Left,Lateral Foot: (In clinic) Topical Lidocaine 4% applied to wound bed Wound #6 Right Calcaneus: (In clinic) Topical Lidocaine  4% applied to wound bed Bathing/ Shower/ Hygiene: May shower and wash wound with soap and water. Off-Loading: DH Walker Boot to: - left foot per ortho Prevalon Boot - Prevalon boot to right foot especially while in bed Other: - Avoid pressure on the posterior (back ) of left lower leg Home Health: No change in wound care orders this week; continue Home Health for wound  care. May utilize formulary equivalent dressing for wound treatment orders unless otherwise specified. Dressing changes to be completed by Home Health on Monday / Wednesday / Friday except when patient has scheduled visit at [facility]. Other Home Health Orders/Instructions: - Verna Czech fax- [phone] WOUND #2: - Lower Leg Wound Laterality: Left, Posterior Prim Dressing: Hydrofera Blue Ready Transfer Foam, 2.5x2.5 (in/in) 3 x Per Week/30 Days ary Discharge Instructions: Apply directly to wound bed as directed Prim Dressing: Santyl Ointment 3 x Per Week/30 Days ary Discharge Instructions: Apply nickel thick amount to wound bed as instructed Secondary Dressing: Woven Gauze Sponge, Non-Sterile 4x4 in 3 x Per Week/30 Days Discharge Instructions: Apply over primary dressing as directed. Secured With: Elastic Bandage 4 inch (ACE bandage) 3 x Per Week/30 Days Discharge Instructions: Secure with ACE bandage as directed. Secured With: [employer], 4.5x3.1 (in/yd) 3 x Per Week/30 Days Discharge Instructions: Secure with Kerlix as directed. WOUND #3: - Ankle Wound Laterality: Left, Medial Prim Dressing: Promogran Prisma Matrix, 4.34 (sq in) (silver collagen) 3 x Per Week/30 Days ary Discharge Instructions: Moisten collagen with saline or hydrogel Secondary Dressing: Woven Gauze Sponge, Non-Sterile 4x4 in 3 x Per Week/30 Days Discharge Instructions: Apply over primary dressing as directed. Secured With: Elastic Bandage 4 inch (ACE bandage) 3 x Per Week/30 Days Discharge Instructions: Secure with ACE bandage as directed. Secured With: [employer], 4.5x3.1 (in/yd) 3 x Per Week/30 Days Discharge Instructions: Secure with Kerlix as directed. WOUND #4: - Foot Wound Laterality: Left, Lateral Prim Dressing: Promogran Prisma Matrix, 4.34 (sq in) (silver collagen) 3 x Per Week/30 Days ary Discharge Instructions: Moisten collagen with saline or hydrogel Secondary  Dressing: Woven Gauze Sponge, Non-Sterile 4x4 in 3 x Per Week/30 Days Discharge Instructions: Apply over primary dressing as directed. Secured With: Elastic Bandage 4 inch (ACE bandage) 3 x Per Week/30 Days Discharge Instructions: Secure with ACE bandage as directed. Secured With: [employer], 4.5x3.1 (in/yd) 3 x Per Week/30 Days Discharge Instructions: Secure with Kerlix as directed. WOUND #6: - Calcaneus Wound Laterality: Right Prim Dressing: Promogran Prisma Matrix, 4.34 (sq in) (silver collagen) 3 x Per Week/30 Days ary Discharge Instructions: Moisten collagen with saline or hydrogel Secondary Dressing: Woven Gauze Sponge, Non-Sterile 4x4 in 3 x Per Week/30 Days Discharge Instructions: Apply over primary dressing as directed. Secured With: Elastic Bandage 4 inch (ACE bandage) 3 x Per Week/30 Days Discharge Instructions: Secure with ACE bandage as directed. Secured With: [employer], 4.5x3.1 (in/yd) 3 x Per Week/30 Days Discharge Instructions: Secure with Kerlix as directed. Neil [name], Neil [name] ([id number]) [id number] Page 10 of 12 01/27/2023: The surgical incision site is healed. His medial ankle wound is down to just a couple of millimeters. His lateral left heel wound is also quite a bit smaller. There is minimal slough accumulation on this wound. The Achilles area on the left is still about the same size with thick, fibrotic, adherent non-viable tissue. The wound on his right heel is smaller. There is still some depth at the center with slough accumulation. I used a curette to debride slough  from the lateral left heel wound and the Achilles wound. The medial ankle wound did not require debridement. I debrided slough and subcutaneous tissue from the right heel wound. We will continue Prisma silver collagen to all of the open sites except the Achilles. We will apply Santyl and Hydrofera Blue here. He will follow-up in 1 week. Electronic  Signature(s) Signed: 01/27/2023 9:13:44 AM By: Duanne Guess MD FACS Entered By: Duanne Guess on 01/27/2023 09:13:44 -------------------------------------------------------------------------------- HxROS Details Patient Name: Date of Service: Neil [name]. 01/27/2023 8:45 A [name] Medical Record Number: [id number] Patient Account Number: [id number] Date of Birth/Sex: Treating RN: 04/04/1946 (77 y.o. [name]) Primary Care Provider: Vilinda Boehringer, DHA NA [facility] Other Clinician: Referring Provider: Treating Provider/Extender: Estrella Myrtle SHI, DHA NA Neil [name] Weeks in Treatment: 2 Information Obtained From Patient Caregiver Chart Eyes Medical History: Positive for: Cataracts - bil extractions Negative for: Glaucoma; Optic Neuritis Ear/Nose/Mouth/Throat Medical History: Negative for: Chronic sinus problems/congestion; Middle ear problems Respiratory Medical History: Positive for: Sleep Apnea Past Medical History Notes: hx aspiration PNA, asbestosis Cardiovascular Medical History: Positive for: Hypertension Gastrointestinal Medical History: Past Medical History Notes: duodenitis, constipation Endocrine Medical History: Negative for: Type I Diabetes; Type II Diabetes Genitourinary Medical History: Negative for: End Stage Renal Disease Immunological Medical History: Negative for: Lupus Erythematosus; Raynauds; Scleroderma Neil [name], Neil [name] ([id number]) [id number] Page 11 of 12 Integumentary (Skin) Medical History: Negative for: History of Burn Musculoskeletal Medical History: Positive for: Osteoarthritis Negative for: Gout; Rheumatoid Arthritis; Osteomyelitis Past Medical History Notes: ankylosing spondylitis, compression fx T10 Neurologic Medical History: Negative for: Dementia; Neuropathy; Quadriplegia; Paraplegia; Seizure Disorder Oncologic Medical History: Negative for: Received Chemotherapy; Received  Radiation Psychiatric Medical History: Negative for: Anorexia/bulimia; Confinement Anxiety HBO Extended History Items Eyes: Cataracts Immunizations Pneumococcal Vaccine: Received Pneumococcal Vaccination: Yes Received Pneumococcal Vaccination On or After 60th Birthday: Yes Implantable Devices Yes Hospitalization / Surgery History Type of Hospitalization/Surgery bil knee replacements ORIF left ankel 5/23 2024 cholecystectomy cervical fusion Family and Social History Cancer: No; Diabetes: No; Heart Disease: Yes - Mother,Father; Hereditary Spherocytosis: No; Hypertension: Yes - Mother,Father; Kidney Disease: No; Lung Disease: No; Seizures: No; Stroke: Yes - Father; Thyroid Problems: No; Tuberculosis: No; Never smoker; Marital Status - Married; Alcohol Use: Rarely; Drug Use: No History; Caffeine Use: Daily - coffee; Financial Concerns: No; Food, Clothing or Shelter Needs: No; Support System Lacking: No; Transportation Concerns: No Electronic Signature(s) Signed: 01/27/2023 9:17:42 AM By: Duanne Guess MD FACS Entered By: Duanne Guess on 01/27/2023 09:10:52 -------------------------------------------------------------------------------- SuperBill Details Patient Name: Date of Service: Neil [name]. 01/27/2023 Medical Record Number: [id number] Patient Account Number: [id number] Date of Birth/Sex: Treating RN: 1946-04-25 (77 y.o. [name]) Primary Care Provider: Vilinda Boehringer, DHA NA [facility] Other Clinician: Referring Provider: Treating Provider/Extender: Estrella Myrtle SHI, DHA NA Neil [name] Weeks in Treatment: 2 Neil [name], Neil [name] ([id number]) [id number] Page 12 of 12 Diagnosis Coding ICD-10 Codes Code Description L97.322 Non-pressure chronic ulcer of left ankle with fat layer exposed L89.623 Pressure ulcer of left heel, stage 3 L89.523 Pressure ulcer of left ankle, stage 3 L89.613 Pressure ulcer of right heel, stage 3 G62.9 Polyneuropathy,  unspecified Facility Procedures : CPT4 Code: [id number] 1 Description: 1042 - DEB SUBQ TISSUE 20 SQ CM/< ICD-10 Diagnosis Description L89.613 Pressure ulcer of right heel, stage 3 Modifier: Quantity: 1 : CPT4 Code: [id number] Description: 7597 - DEBRIDE WOUND 1ST 20 SQ CM OR < ICD-10 Diagnosis Description L89.523 Pressure ulcer of left ankle, stage 3 L89.623 Pressure ulcer of left  heel, stage 3 Modifier: Quantity: 1 Physician Procedures : CPT4 Code Description Modifier [id number] - WC PHYS LEVEL 4 - EST PT 25 ICD-10 Diagnosis Description L97.322 Non-pressure chronic ulcer of left ankle with fat layer exposed L89.623 Pressure ulcer of left heel, stage 3 L89.523 Pressure ulcer of left  ankle, stage 3 L89.613 Pressure ulcer of right heel, stage 3 Quantity: 1 : [id number] 11042 - WC PHYS SUBQ TISS 20 SQ CM ICD-10 Diagnosis Description L89.613 Pressure ulcer of right heel, stage 3 Quantity: 1 : [id number] 97597 - WC PHYS DEBR WO ANESTH 20 SQ CM ICD-10 Diagnosis Description L89.523 Pressure ulcer of left ankle, stage 3 L89.623 Pressure ulcer of left heel, stage 3 Quantity: 1 Electronic Signature(s) Signed: 01/27/2023 9:14:16 AM By: Duanne Guess MD FACS Entered By: Duanne Guess on 01/27/2023 09:14:16

## 2023-01-31 ENCOUNTER — Encounter (HOSPITAL_BASED_OUTPATIENT_CLINIC_OR_DEPARTMENT_OTHER): Payer: Medicare Other | Admitting: General Surgery

## 2023-01-31 ENCOUNTER — Ambulatory Visit (HOSPITAL_BASED_OUTPATIENT_CLINIC_OR_DEPARTMENT_OTHER): Payer: Medicare Other | Admitting: Physical Therapy

## 2023-01-31 DIAGNOSIS — E11621 Type 2 diabetes mellitus with foot ulcer: Secondary | ICD-10-CM | POA: Diagnosis not present

## 2023-01-31 NOTE — Progress Notes (Signed)
Neil, [name] ([id number]) [id number] Page 1 of 6 Visit Report for 01/31/2023 Chief Complaint Document Details Patient Name: Date of Service: [name] [name]. 01/31/2023 11:15 A M Medical Record Number: [id number] Patient Account Number: [id number] Date of Birth/Sex: Treating RN: 1946/01/06 (77 y.o. M) Primary Care Provider: Vilinda Boehringer, DHA NA [facility] Other Clinician: Referring Provider: Treating Provider/Extender: Estrella Myrtle SHI, DHA NA [facility] Weeks in Treatment: 3 Information Obtained from: Patient Chief Complaint Patient presents to the wound care center with open non-healing surgical wound(s) and pressure ulcers Electronic Signature(s) Signed: 01/31/2023 11:46:45 AM By: Duanne Guess MD FACS Entered By: Duanne Guess on 01/31/2023 11:46:45 -------------------------------------------------------------------------------- Debridement Details Patient Name: Date of Service: [name] M M. 01/31/2023 11:15 A M Medical Record Number: [id number] Patient Account Number: [id number] Date of Birth/Sex: Treating RN: [birth year] (77 y.o. [name] Primary Care Provider: Vilinda Boehringer, DHA NA [facility] Other Clinician: Referring Provider: Treating Provider/Extender: Estrella Myrtle SHI, DHA NA [facility] Weeks in Treatment: 3 Debridement Performed for Assessment: Wound #4 Left,Lateral Foot Performed By: Physician Duanne Guess, MD Debridement Type: Debridement Level of Consciousness (Pre-procedure): Awake and Alert Pre-procedure Verification/Time Out Yes - 11:55 Taken: Start Time: 11:56 Pain Control: Lidocaine 4% T opical Solution Percent of Wound Bed Debrided: 100% T Area Debrided (cm): otal 0.09 Tissue and other material debrided: Non-Viable, Slough, Slough Level: Non-Viable Tissue Debridement Description: Selective/Open Wound Instrument: Curette Bleeding: Minimum Hemostasis Achieved: Pressure Procedural Pain: 0 Post  Procedural Pain: 0 Response to Treatment: Procedure was tolerated well Level of Consciousness (Post- Awake and Alert procedure): Post Debridement Measurements of Total Wound Length: (cm) 0.4 Stage: Category/Stage III Width: (cm) 0.3 Depth: (cm) 0.1 Volume: (cm) 0.009 Character of Wound/Ulcer Post Debridement: Improved Post Procedure Diagnosis ADONAI, COLBECK ([id number]) [id number] Page 2 of 6 Same as Pre-procedure Notes Scribed for Dr. Lady Gary by Zenaida Deed, RN Electronic Signature(s) Unsigned Entered By: Zenaida Deed on 01/31/2023 11:59:38 -------------------------------------------------------------------------------- Debridement Details Patient Name: Date of Service: [facility] [name]. 01/31/2023 11:15 A M Medical Record Number: [id number] Patient Account Number: [id number] Date of Birth/Sex: Treating RN: [birth year] (77 y.o. [name], [name] Primary Care Provider: Vilinda Boehringer, DHA NA [facility] Other Clinician: Referring Provider: Treating Provider/Extender: Estrella Myrtle SHI, DHA NA [facility] Weeks in Treatment: 3 Debridement Performed for Assessment: Wound #3 Left,Medial Ankle Performed By: Physician Duanne Guess, MD Debridement Type: Debridement Level of Consciousness (Pre-procedure): Awake and Alert Pre-procedure Verification/Time Out Yes - 11:55 Taken: Start Time: 11:56 Pain Control: Lidocaine 4% Topical Solution Percent of Wound Bed Debrided: 100% T Area Debrided (cm): otal 0.03 Tissue and other material debrided: Non-Viable, Eschar Level: Non-Viable Tissue Debridement Description: Selective/Open Wound Instrument: Curette Bleeding: Minimum Hemostasis Achieved: Pressure Procedural Pain: 0 Post Procedural Pain: 0 Response to Treatment: Procedure was tolerated well Level of Consciousness (Post- Awake and Alert procedure): Post Debridement Measurements of Total Wound Length: (cm) 0.2 Stage: Category/Stage  III Width: (cm) 0.2 Depth: (cm) 0.1 Volume: (cm) 0.003 Character of Wound/Ulcer Post Debridement: Improved Post Procedure Diagnosis Same as Pre-procedure Notes Scribed for Dr. Lady Gary by Zenaida Deed, RN Electronic Signature(s) Unsigned Entered By: Zenaida Deed on 01/31/2023 12:00:32 Signature(s): Elenore Paddy ([id number]) [id number] Date(s): [id number] Page 3 of 6 -------------------------------------------------------------------------------- Debridement Details Patient Name: Date of Service: [name] [name]. 01/31/2023 11:15 A M Medical Record Number: [id number] Patient Account Number: [id number] Date of Birth/Sex: Treating RN: July 22, 1945 (77 y.o. [name] Primary Care Provider: [facility], DHA NA  [facility] Other Clinician: Referring Provider: Treating Provider/Extender: Estrella Myrtle SHI, DHA NA [facility] Weeks in Treatment: 3 Debridement Performed for Assessment: Wound #2 Left,Posterior Lower Leg Performed By: Physician Duanne Guess, MD Debridement Type: Debridement Level of Consciousness (Pre-procedure): Awake and Alert Pre-procedure Verification/Time Out Yes - 11:55 Taken: Start Time: 11:56 Pain Control: Lidocaine 4% T opical Solution Percent of Wound Bed Debrided: 100% T Area Debrided (cm): otal 5.5 Tissue and other material debrided: Non-Viable, Slough, Slough Level: Non-Viable Tissue Debridement Description: Selective/Open Wound Instrument: Curette Bleeding: Minimum Hemostasis Achieved: Pressure Procedural Pain: 0 Post Procedural Pain: 0 Response to Treatment: Procedure was tolerated well Level of Consciousness (Post- Awake and Alert procedure): Post Debridement Measurements of Total Wound Length: (cm) 3.5 Stage: Category/Stage III Width: (cm) 2 Depth: (cm) 0.2 Volume: (cm) 1.1 Character of Wound/Ulcer Post Debridement: Improved Post Procedure Diagnosis Same as Pre-procedure Notes Scribed for Dr. Lady Gary  by Zenaida Deed, RN Electronic Signature(s) Unsigned Entered By: Zenaida Deed on 01/31/2023 12:01:17 -------------------------------------------------------------------------------- Debridement Details Patient Name: Date of Service: [name] [name]. 01/31/2023 11:15 A M Medical Record Number: [id number] Patient Account Number: [id number] Date of Birth/Sex: Treating RN: 04-13-1946 (77 y.o. [name] Primary Care Provider: Vilinda Boehringer, DHA NA [facility] Other Clinician: Referring Provider: Treating Provider/Extender: Estrella Myrtle SHI, DHA NA [facility] Weeks in Treatment: 3 Debridement Performed for Assessment: Wound #6 Right Calcaneus Performed By: Physician Duanne Guess, MD Debridement Type: Debridement Level of Consciousness (Pre-procedure): Awake and Alert Pre-procedure Verification/Time Out SHAKEEL, DRZEWIECKI ([id number]) [id number] Page 4 of 6 Pre-procedure Verification/Time Out Yes - 11:55 Taken: Start Time: 11:56 Pain Control: Lidocaine 4% T opical Solution Percent of Wound Bed Debrided: 100% T Area Debrided (cm): otal 0.88 Tissue and other material debrided: Non-Viable, Slough, Slough Level: Non-Viable Tissue Debridement Description: Selective/Open Wound Instrument: Curette Bleeding: Minimum Hemostasis Achieved: Pressure Procedural Pain: 0 Post Procedural Pain: 0 Response to Treatment: Procedure was tolerated well Level of Consciousness (Post- Awake and Alert procedure): Post Debridement Measurements of Total Wound Length: (cm) 0.8 Stage: Category/Stage III Width: (cm) 1.4 Depth: (cm) 0.1 Volume: (cm) 0.088 Character of Wound/Ulcer Post Debridement: Improved Post Procedure Diagnosis Same as Pre-procedure Notes Scribed for Dr. Lady Gary by Zenaida Deed, RN Electronic Signature(s) Unsigned Entered By: Zenaida Deed on 01/31/2023  12:02:03 -------------------------------------------------------------------------------- Physician Orders Details Patient Name: Date of Service: [facility] [name]. 01/31/2023 11:15 A M Medical Record Number: [id number] Patient Account Number: [id number] Date of Birth/Sex: Treating RN: March 13, 1946 (77 y.o. [name] Primary Care Provider: Vilinda Boehringer, DHA NA [facility] Other Clinician: Referring Provider: Treating Provider/Extender: Estrella Myrtle SHI, DHA NA [facility] Weeks in Treatment: 3 Verbal / Phone Orders: No Diagnosis Coding ICD-10 Coding Code Description L97.322 Non-pressure chronic ulcer of left ankle with fat layer exposed L89.623 Pressure ulcer of left heel, stage 3 L89.523 Pressure ulcer of left ankle, stage 3 L89.613 Pressure ulcer of right heel, stage 3 G62.9 Polyneuropathy, unspecified Follow-up Appointments ppointment in 1 week. - Dr. Lady Gary RM 1 Return A Tuesday 8/6 @ 11:15 am Anesthetic Wound #2 Left,Posterior Lower Leg (In clinic) Topical Lidocaine 4% applied to wound bed Wound #3 Left,Medial Ankle (In clinic) Topical Lidocaine 4% applied to wound bed Elenore Paddy ([id number]) [id number] Page 5 of 6 Wound #4 Left,Lateral Foot (In clinic) Topical Lidocaine 4% applied to wound bed Wound #6 Right Calcaneus (In clinic) Topical Lidocaine 4% applied to wound bed Bathing/ Shower/ Hygiene May shower and wash wound with soap and water. Off-Loading DH  Walker Boot to: - left foot per ortho Prevalon Boot - Prevalon boot to right foot especially while in bed Other: - Avoid pressure on the posterior (back ) of left lower leg Home Health No change in wound care orders this week; continue Home Health for wound care. May utilize formulary equivalent dressing for wound treatment orders unless otherwise specified. Dressing changes to be completed by Home Health on Monday / Wednesday / Friday except when patient has scheduled visit  at [facility]. Other Home Health Orders/Instructions: - Verna Czech fax- [phone] Wound Treatment Wound #2 - Lower Leg Wound Laterality: Left, Posterior Prim Dressing: Hydrofera Blue Ready Transfer Foam, 2.5x2.5 (in/in) 3 x Per Week/30 Days ary Discharge Instructions: Apply directly to wound bed as directed Prim Dressing: Santyl Ointment 3 x Per Week/30 Days ary Discharge Instructions: Apply nickel thick amount to wound bed as instructed Secondary Dressing: Woven Gauze Sponge, Non-Sterile 4x4 in 3 x Per Week/30 Days Discharge Instructions: Apply over primary dressing as directed. Secured With: Elastic Bandage 4 inch (ACE bandage) 3 x Per Week/30 Days Discharge Instructions: Secure with ACE bandage as directed. Secured With: [employer], 4.5x3.1 (in/yd) 3 x Per Week/30 Days Discharge Instructions: Secure with Kerlix as directed. Wound #3 - Ankle Wound Laterality: Left, Medial Prim Dressing: Promogran Prisma Matrix, 4.34 (sq in) (silver collagen) 3 x Per Week/30 Days ary Discharge Instructions: Moisten collagen with saline or hydrogel Secondary Dressing: Woven Gauze Sponge, Non-Sterile 4x4 in 3 x Per Week/30 Days Discharge Instructions: Apply over primary dressing as directed. Secured With: Elastic Bandage 4 inch (ACE bandage) 3 x Per Week/30 Days Discharge Instructions: Secure with ACE bandage as directed. Secured With: [employer], 4.5x3.1 (in/yd) 3 x Per Week/30 Days Discharge Instructions: Secure with Kerlix as directed. Wound #4 - Foot Wound Laterality: Left, Lateral Prim Dressing: Promogran Prisma Matrix, 4.34 (sq in) (silver collagen) 3 x Per Week/30 Days ary Discharge Instructions: Moisten collagen with saline or hydrogel Secondary Dressing: Woven Gauze Sponge, Non-Sterile 4x4 in 3 x Per Week/30 Days Discharge Instructions: Apply over primary dressing as directed. Secured With: Elastic Bandage 4 inch (ACE bandage) 3 x Per Week/30  Days Discharge Instructions: Secure with ACE bandage as directed. Secured With: [employer], 4.5x3.1 (in/yd) 3 x Per Week/30 Days Discharge Instructions: Secure with Kerlix as directed. Wound #6 - Calcaneus Wound Laterality: Right Prim Dressing: Promogran Prisma Matrix, 4.34 (sq in) (silver collagen) 3 x Per Week/30 Days ary Discharge Instructions: Moisten collagen with saline or hydrogel Secondary Dressing: Woven Gauze Sponge, Non-Sterile 4x4 in 3 x Per Week/30 Days Discharge Instructions: Apply over primary dressing as directed. Secured With: Elastic Bandage 4 inch (ACE bandage) 3 x Per Week/30 Days Discharge Instructions: Secure with ACE bandage as directed. Secured With: [employer], 4.5x3.1 (in/yd) 3 x Per Week/30 Days Discharge Instructions: Secure with Kerlix as directed. ARTHEL, CURREN ([id number]) [id number] Page 6 of 6 Electronic Signature(s) Unsigned Entered By: Zenaida Deed on 01/31/2023 12:05:24 -------------------------------------------------------------------------------- Problem List Details Patient Name: Date of Service: [name] [name]. 01/31/2023 11:15 A M Medical Record Number: [id number] Patient Account Number: [id number] Date of Birth/Sex: Treating RN: [birth year] (77 y.o. M) Primary Care Provider: Vilinda Boehringer, DHA NA [facility] Other Clinician: Referring Provider: Treating Provider/Extender: Estrella Myrtle SHI, DHA NA [facility] Weeks in Treatment: 3 Active Problems ICD-10 Encounter Code Description Active Date MDM Diagnosis L97.322 Non-pressure chronic ulcer of left ankle with fat layer exposed 01/09/2023 No Yes L89.623 Pressure ulcer of  left heel, stage 3 01/09/2023 No Yes L89.523 Pressure ulcer of left ankle, stage 3 01/09/2023 No Yes L89.613 Pressure ulcer of right heel, stage 3 01/09/2023 No Yes G62.9 Polyneuropathy, unspecified 01/09/2023 No Yes Inactive Problems Resolved Problems Electronic  Signature(s) Signed: 01/31/2023 11:46:05 AM By: Duanne Guess MD FACS Entered By: Duanne Guess on 01/31/2023 11:46:04

## 2023-02-01 NOTE — Progress Notes (Signed)
SUGAR, LULGJURAJ ([id number]) [id number] Page 1 of 14 Visit Report for 01/31/2023 Arrival Information Details Patient Name: Date of Service: [facility] [name] 01/31/2023 11:15 A M Medical Record Number: [id number] Patient Account Number: [id number] Date of Birth/Sex: Treating RN: Jul 19, 1945 (77 y.o. M) Primary Care Tylar Amborn: Vilinda Boehringer, DHA NA [facility] Other Clinician: Referring Laurin Paulo: Treating Roczen Waymire/Extender: Estrella Myrtle SHI, DHA NA [facility] Weeks in Treatment: 3 Visit Information History Since Last Visit All ordered tests and consults were completed: No Patient Arrived: Wheel Chair Added or deleted any medications: No Arrival Time: 11:27 Any new allergies or adverse reactions: No Accompanied By: wife Had a fall or experienced change in No Transfer Assistance: None activities of daily living that may affect Patient Identification Verified: Yes risk of falls: Secondary Verification Process Completed: Yes Signs or symptoms of abuse/neglect since last visito No Patient Requires Transmission-Based Precautions: No Hospitalized since last visit: No Patient Has Alerts: No Implantable device outside of the clinic excluding No cellular tissue based products placed in the center since last visit: Has Dressing in Place as Prescribed: Yes Pain Present Now: No Electronic Signature(s) Signed: 01/31/2023 5:32:33 PM By: Zenaida Deed RN, BSN Entered By: Zenaida Deed on 01/31/2023 11:45:25 -------------------------------------------------------------------------------- Encounter Discharge Information Details Patient Name: Date of Service: [name] M. 01/31/2023 11:15 A M Medical Record Number: [id number] Patient Account Number: [id number] Date of Birth/Sex: Treating RN: 11-20-1945 (77 y.o. [name] Primary Care Eyden Dobie: Vilinda Boehringer, DHA NA [facility] Other Clinician: Referring Ron Junco: Treating Rosaleen Mazer/Extender: Estrella Myrtle SHI, DHA NA [facility] Weeks in Treatment: 3 Encounter Discharge Information Items Post Procedure Vitals Discharge Condition: Stable Temperature (F): 97.8 Ambulatory Status: Wheelchair Pulse (bpm): 64 Discharge Destination: Home Respiratory Rate (breaths/min): 18 Transportation: Private Auto Blood Pressure (mmHg): 144/65 Accompanied By: spouse Schedule Follow-up Appointment: Yes Clinical Summary of Care: Patient Declined Electronic Signature(s) Signed: 01/31/2023 5:32:33 PM By: Zenaida Deed RN, BSN Entered By: Zenaida Deed on 01/31/2023 12:20:52 Elenore Paddy ([id number]) [id number] Page 2 of 14 -------------------------------------------------------------------------------- Lower Extremity Assessment Details Patient Name: Date of Service: [facility] [name] 01/31/2023 11:15 A M Medical Record Number: [id number] Patient Account Number: [id number] Date of Birth/Sex: Treating RN: 08-29-1945 (77 y.o. [name] Primary Care Legaci Tarman: Vilinda Boehringer, DHA NA [facility] Other Clinician: Referring Halsey Hammen: Treating Kaicen Desena/Extender: Estrella Myrtle SHI, DHA NA [facility] Weeks in Treatment: 3 Edema Assessment Assessed: [Left: No] [Right: No] Edema: [Left: Yes] [Right: Yes] Calf Left: Right: Point of Measurement: From Medial Instep 38.5 cm 39.5 cm Ankle Left: Right: Point of Measurement: From Medial Instep 28.1 cm 27 cm Vascular Assessment Pulses: Dorsalis Pedis Palpable: [Left:Yes] [Right:Yes] Extremity colors, hair growth, and conditions: Extremity Color: [Left:Normal] [Right:Normal] Hair Growth on Extremity: [Left:Yes] [Right:Yes] Temperature of Extremity: [Left:Warm] [Right:Warm] Capillary Refill: [Left:< 3 seconds] [Right:< 3 seconds] Dependent Rubor: [Left:No No] [Right:No No] Electronic Signature(s) Signed: 01/31/2023 5:32:33 PM By: Zenaida Deed RN, BSN Entered By: Zenaida Deed on 01/31/2023  11:47:24 -------------------------------------------------------------------------------- Multi Wound Chart Details Patient Name: Date of Service: [name] M. 01/31/2023 11:15 A M Medical Record Number: [id number] Patient Account Number: [id number] Date of Birth/Sex: Treating RN: 26-Oct-1945 (77 y.o. M) Primary Care Raed Schalk: Vilinda Boehringer, DHA NA [facility] Other Clinician: Referring Mireille Lacombe: Treating Giah Fickett/Extender: Estrella Myrtle SHI, DHA NA [facility] Weeks in Treatment: 3 Vital Signs Height(in): 72 Pulse(bpm): 64 Weight(lbs): 240 Blood Pressure(mmHg): 144/65 Body Mass Index(BMI): 32.5 Temperature(F): 97.8 Respiratory Rate(breaths/min): 18 [2:Photos:] [4:[id number] Page 3 of  14] Left, Posterior Lower Leg Left, Medial Ankle Left, Lateral Foot Wound Location: Pressure Injury Pressure Injury Pressure Injury Wounding Event: Pressure Ulcer Pressure Ulcer Pressure Ulcer Primary Etiology: Cataracts, Sleep Apnea, Hypertension, Cataracts, Sleep Apnea, Hypertension, Cataracts, Sleep Apnea, Hypertension, Comorbid History: Osteoarthritis Osteoarthritis Osteoarthritis 12/03/2022 12/03/2022 12/03/2022 Date Acquired: 3 3 3  Weeks of Treatment: Open Open Open Wound Status: No No No Wound Recurrence: 3.5x2x0.2 0.1x0.1x0.1 0.4x0.3x0.1 Measurements L x W x D (cm) 5.498 0.008 0.094 A (cm) : rea 1.1 0.001 0.009 Volume (cm) : -366.70% 98.50% 83.40% % Reduction in A rea: -832.20% 98.20% 84.20% % Reduction in Volume: Category/Stage III Category/Stage III Category/Stage III Classification: Medium None Present Small Exudate A mount: Serosanguineous N/A Serosanguineous Exudate Type: red, brown N/A red, brown Exudate Color: Epibole Flat and Intact Flat and Intact Wound Margin: Small (1-33%) None Present (0%) Large (67-100%) Granulation A mount: Red, Pink N/A Red, Pink Granulation Quality: Large (67-100%) Large (67-100%) Small (1-33%) Necrotic A  mount: Fat Layer (Subcutaneous Tissue): Yes Fat Layer (Subcutaneous Tissue): Yes Fat Layer (Subcutaneous Tissue): Yes Exposed Structures: Fascia: No Fascia: No Fascia: No Tendon: No Tendon: No Tendon: No Muscle: No Muscle: No Muscle: No Joint: No Joint: No Joint: No Bone: No Bone: No Bone: No None Large (67-100%) Small (1-33%) Epithelialization: Debridement - Selective/Open Wound Debridement - Selective/Open Wound Debridement - Selective/Open Wound Debridement: Pre-procedure Verification/Time Out 11:55 11:55 11:55 Taken: Lidocaine 4% Topical Solution Lidocaine 4% Topical Solution Lidocaine 4% Topical Solution Pain Control: [employer] operational [employer] Tissue Debrided: Non-Viable Tissue Non-Viable Tissue Non-Viable Tissue Level: 5.5 0.03 0.09 Debridement A (sq cm): rea Curette Curette Curette Instrument: Minimum Minimum Minimum Bleeding: Pressure Pressure Pressure Hemostasis A chieved: 0 0 0 Procedural Pain: 0 0 0 Post Procedural Pain: Procedure was tolerated well Procedure was tolerated well Procedure was tolerated well Debridement Treatment Response: 3.5x2x0.2 0.2x0.2x0.1 0.4x0.3x0.1 Post Debridement Measurements L x W x D (cm) 1.1 0.003 0.009 Post Debridement Volume: (cm) Category/Stage III Category/Stage III Category/Stage III Post Debridement Stage: No Abnormalities Noted No Abnormalities Noted No Abnormalities Noted Periwound Skin Texture: No Abnormalities Noted No Abnormalities Noted No Abnormalities Noted Periwound Skin Moisture: No Abnormalities Noted No Abnormalities Noted No Abnormalities Noted Periwound Skin Color: No Abnormality No Abnormality No Abnormality Temperature: N/A N/A Yes Tenderness on Palpation: Debridement Debridement Debridement Procedures Performed: Wound Number: 6 N/A N/A Photos: N/A N/A Right Calcaneus N/A N/A Wound Location: Pressure Injury N/A N/A Wounding Event: Pressure Ulcer N/A N/A Primary Etiology: Cataracts,  Sleep Apnea, Hypertension, N/A N/A Comorbid History: Osteoarthritis 12/03/2022 N/A N/A Date Acquired: 3 N/A N/A Weeks of Treatment: Open N/A N/A Wound Status: No N/A N/A Wound Recurrence: 0.8x1.4x0.1 N/A N/A Measurements L x W x D (cm) 0.88 N/A N/A A (cm) : rea 0.088 N/A N/A Volume (cm) : 87.70% N/A N/A % Reduction in Area: SANDEEP, OLDS ([id number]) [id number] Page 4 of 14 87.70% N/A N/A % Reduction in Volume: Category/Stage III N/A N/A Classification: Small N/A N/A Exudate A mount: Serosanguineous N/A N/A Exudate Type: red, brown N/A N/A Exudate Color: Flat and Intact N/A N/A Wound Margin: Large (67-100%) N/A N/A Granulation A mount: Red, Pink N/A N/A Granulation Quality: Small (1-33%) N/A N/A Necrotic A mount: Fat Layer (Subcutaneous Tissue): Yes N/A N/A Exposed Structures: Fascia: No Tendon: No Muscle: No Joint: No Bone: No Medium (34-66%) N/A N/A Epithelialization: Debridement - Selective/Open Wound N/A N/A Debridement: Pre-procedure Verification/Time Out 11:55 N/A N/A Taken: Lidocaine 4% Topical Solution N/A N/A Pain Control: Slough N/A N/A Tissue Debrided: Non-Viable  Tissue N/A N/A Level: 0.88 N/A N/A Debridement A (sq cm): rea Curette N/A N/A Instrument: Minimum N/A N/A Bleeding: Pressure N/A N/A Hemostasis A chieved: 0 N/A N/A Procedural Pain: 0 N/A N/A Post Procedural Pain: Procedure was tolerated well N/A N/A Debridement Treatment Response: 0.8x1.4x0.1 N/A N/A Post Debridement Measurements L x W x D (cm) 0.088 N/A N/A Post Debridement Volume: (cm) Category/Stage III N/A N/A Post Debridement Stage: Callus: Yes N/A N/A Periwound Skin Texture: Dry/Scaly: Yes N/A N/A Periwound Skin Moisture: No Abnormalities Noted N/A N/A Periwound Skin Color: No Abnormality N/A N/A Temperature: Debridement N/A N/A Procedures Performed: Treatment Notes Wound #2 (Lower Leg) Wound Laterality: Left,  Posterior Cleanser Peri-Wound Care Topical Primary Dressing Hydrofera Blue Ready Transfer Foam, 2.5x2.5 (in/in) Discharge Instruction: Apply directly to wound bed as directed Santyl Ointment Discharge Instruction: Apply nickel thick amount to wound bed as instructed Secondary Dressing Woven Gauze Sponge, Non-Sterile 4x4 in Discharge Instruction: Apply over primary dressing as directed. Secured With Elastic Bandage 4 inch (ACE bandage) Discharge Instruction: Secure with ACE bandage as directed. Kerlix Roll Sterile, 4.5x3.1 (in/yd) Discharge Instruction: Secure with Kerlix as directed. Compression Wrap Compression Stockings Add-Ons Wound #3 (Ankle) Wound Laterality: Left, Medial Cleanser Peri-Wound Care Topical Primary Dressing Promogran Prisma Matrix, 4.34 (sq in) (silver collagen) EDWARDO, TULEY ([id number]) [id number] Page 5 of 14 Discharge Instruction: Moisten collagen with saline or hydrogel Secondary Dressing Woven Gauze Sponge, Non-Sterile 4x4 in Discharge Instruction: Apply over primary dressing as directed. Secured With Elastic Bandage 4 inch (ACE bandage) Discharge Instruction: Secure with ACE bandage as directed. Kerlix Roll Sterile, 4.5x3.1 (in/yd) Discharge Instruction: Secure with Kerlix as directed. Compression Wrap Compression Stockings Add-Ons Wound #4 (Foot) Wound Laterality: Left, Lateral Cleanser Peri-Wound Care Topical Primary Dressing Promogran Prisma Matrix, 4.34 (sq in) (silver collagen) Discharge Instruction: Moisten collagen with saline or hydrogel Secondary Dressing Woven Gauze Sponge, Non-Sterile 4x4 in Discharge Instruction: Apply over primary dressing as directed. Secured With Elastic Bandage 4 inch (ACE bandage) Discharge Instruction: Secure with ACE bandage as directed. Kerlix Roll Sterile, 4.5x3.1 (in/yd) Discharge Instruction: Secure with Kerlix as directed. Compression Wrap Compression  Stockings Add-Ons Wound #6 (Calcaneus) Wound Laterality: Right Cleanser Peri-Wound Care Topical Primary Dressing Promogran Prisma Matrix, 4.34 (sq in) (silver collagen) Discharge Instruction: Moisten collagen with saline or hydrogel Secondary Dressing Woven Gauze Sponge, Non-Sterile 4x4 in Discharge Instruction: Apply over primary dressing as directed. Secured With Elastic Bandage 4 inch (ACE bandage) Discharge Instruction: Secure with ACE bandage as directed. Kerlix Roll Sterile, 4.5x3.1 (in/yd) Discharge Instruction: Secure with Kerlix as directed. Compression Wrap Compression Stockings Add-Ons Electronic Signature(s) AHMEIR, STEPKA ([id number]) [id number] Page 6 of 14 Signed: 01/31/2023 12:22:36 PM By: Duanne Guess MD FACS Previous Signature: 01/31/2023 11:46:22 AM Version By: Duanne Guess MD FACS Entered By: Duanne Guess on 01/31/2023 12:22:36 -------------------------------------------------------------------------------- Multi-Disciplinary Care Plan Details Patient Name: Date of Service: [name] M. 01/31/2023 11:15 A M Medical Record Number: [id number] Patient Account Number: [id number] Date of Birth/Sex: Treating RN: [birth year] (77 y.o. [name] Primary Care [place] Kappes: Vilinda Boehringer, DHA NA [facility] Other Clinician: Referring Barett Whidbee: Treating Debrah Granderson/Extender: Estrella Myrtle SHI, DHA NA [facility] Weeks in Treatment: 3 Multidisciplinary Care Plan reviewed with physician Active Inactive Abuse / Safety / Falls / Self Care Management Nursing Diagnoses: Potential for falls Goals: Patient/caregiver will verbalize/demonstrate measures taken to prevent injury and/or falls Date Initiated: 01/09/2023 Target Resolution Date: 02/06/2023 Goal Status: Active Interventions: Assess fall risk on admission and as needed Assess: immobility, friction, shearing,  incontinence upon admission and as needed Assess impairment of  mobility on admission and as needed per policy Notes: Pressure Nursing Diagnoses: Knowledge deficit related to causes and risk factors for pressure ulcer development Knowledge deficit related to management of pressures ulcers Potential for impaired tissue integrity related to pressure, friction, moisture, and shear Goals: Patient/caregiver will verbalize understanding of pressure ulcer management Date Initiated: 01/09/2023 Target Resolution Date: 02/06/2023 Goal Status: Active Interventions: Assess: immobility, friction, shearing, incontinence upon admission and as needed Assess offloading mechanisms upon admission and as needed Assess potential for pressure ulcer upon admission and as needed Notes: Wound/Skin Impairment Nursing Diagnoses: Impaired tissue integrity Knowledge deficit related to ulceration/compromised skin integrity Goals: Patient/caregiver will verbalize understanding of skin care regimen Date Initiated: 01/09/2023 Target Resolution Date: 02/06/2023 Goal Status: Active Ulcer/skin breakdown will have a volume reduction of 30% by week 4 Date Initiated: 01/09/2023 Target Resolution Date: 02/06/2023 Goal Status: Active Interventions: GIB, AYLSWORTH ([id number]) [id number] Page 7 of 14 Assess patient/caregiver ability to obtain necessary supplies Assess patient/caregiver ability to perform ulcer/skin care regimen upon admission and as needed Assess ulceration(s) every visit Provide education on ulcer and skin care Treatment Activities: Skin care regimen initiated : 01/09/2023 Topical wound management initiated : 01/09/2023 Notes: Electronic Signature(s) Signed: 01/31/2023 5:32:33 PM By: Zenaida Deed RN, BSN Entered By: Zenaida Deed on 01/31/2023 11:55:22 -------------------------------------------------------------------------------- Pain Assessment Details Patient Name: Date of Service: [name] M. 01/31/2023 11:15 A  M Medical Record Number: [id number] Patient Account Number: [id number] Date of Birth/Sex: Treating RN: 1946/03/20 (77 y.o. M) Primary Care Charmon Thorson: Vilinda Boehringer, DHA NA [facility] Other Clinician: Referring Raquel Sayres: Treating Waller Marcussen/Extender: Estrella Myrtle SHI, DHA NA [facility] Weeks in Treatment: 3 Active Problems Location of Pain Severity and Description of Pain Patient Has Paino No Site Locations Rate the pain. Current Pain Level: 0 Pain Management and Medication Current Pain Management: Electronic Signature(s) Signed: 01/31/2023 5:32:33 PM By: Zenaida Deed RN, BSN Entered By: Zenaida Deed on 01/31/2023 11:46:07 -------------------------------------------------------------------------------- Patient/Caregiver Education Details Patient Name: Date of Service: [facility] MSO [name] 7/30/2024andnbsp11:15 Mervyn Skeeters Serita Grammes ([id number]) [id number] Page 8 of 14 Medical Record Number: [id number] Patient Account Number: [id number] Date of Birth/Gender: Treating RN: Apr 19, 1946 (77 y.o. [name] Primary Care Physician: Vilinda Boehringer, DHA NA [facility] Other Clinician: Referring Physician: Treating Physician/Extender: Estrella Myrtle SHI, DHA NA [facility] Weeks in Treatment: 3 Education Assessment Education Provided To: Patient Education Topics Provided Pressure: Methods: Explain/Verbal Responses: Reinforcements needed, State content correctly Wound/Skin Impairment: Methods: Explain/Verbal Responses: Reinforcements needed, State content correctly Electronic Signature(s) Signed: 01/31/2023 5:32:33 PM By: Zenaida Deed RN, BSN Entered By: Zenaida Deed on 01/31/2023 11:56:00 -------------------------------------------------------------------------------- Wound Assessment Details Patient Name: Date of Service: [name] M. 01/31/2023 11:15 A M Medical Record Number: [id number] Patient Account Number: [id number] Date of Birth/Sex:  Treating RN: [birth year] (77 y.o. M) Primary Care Kymoni Lesperance: Vilinda Boehringer, DHA NA [facility] Other Clinician: Referring Channah Godeaux: Treating Zennie Ayars/Extender: Estrella Myrtle SHI, DHA NA SHREE Weeks in Treatment: 3 Wound Status Wound Number: 2 Primary Etiology: Pressure Ulcer Wound Location: Left, Posterior Lower Leg Wound Status: Open Wounding Event: Pressure Injury Comorbid History: Cataracts, Sleep Apnea, Hypertension, Osteoarthritis Date Acquired: 12/03/2022 Weeks Of Treatment: 3 Clustered Wound: No Photos Wound Measurements Length: (cm) 3.5 Width: (cm) 2 Depth: (cm) 0.2 Area: (cm) 5.498 Volume: (cm) 1.1 % Reduction in Area: -366.7% % Reduction in Volume: -832.2% Epithelialization: None Tunneling: No Undermining: No Wound Description EPIMENIO, MITTAG ([id number])  Classification: Category/Stage III Wound Margin: Epibole Exudate Amount: Medium Exudate Type: Serosanguineous Exudate Color: red, brown [id number] Page 9 of 14 Foul Odor After Cleansing: No Slough/Fibrino Yes Wound Bed Granulation Amount: Small (1-33%) Exposed Structure Granulation Quality: Red, Pink Fascia Exposed: No Necrotic Amount: Large (67-100%) Fat Layer (Subcutaneous Tissue) Exposed: Yes Necrotic Quality: Adherent Slough Tendon Exposed: No Muscle Exposed: No Joint Exposed: No Bone Exposed: No Periwound Skin Texture Texture Color No Abnormalities Noted: Yes No Abnormalities Noted: Yes Moisture Temperature / Pain No Abnormalities Noted: Yes Temperature: No Abnormality Treatment Notes Wound #2 (Lower Leg) Wound Laterality: Left, Posterior Cleanser Peri-Wound Care Topical Primary Dressing Hydrofera Blue Ready Transfer Foam, 2.5x2.5 (in/in) Discharge Instruction: Apply directly to wound bed as directed Santyl Ointment Discharge Instruction: Apply nickel thick amount to wound bed as instructed Secondary Dressing Woven Gauze Sponge, Non-Sterile 4x4 in Discharge  Instruction: Apply over primary dressing as directed. Secured With Elastic Bandage 4 inch (ACE bandage) Discharge Instruction: Secure with ACE bandage as directed. Kerlix Roll Sterile, 4.5x3.1 (in/yd) Discharge Instruction: Secure with Kerlix as directed. Compression Wrap Compression Stockings Add-Ons Electronic Signature(s) Signed: 01/31/2023 5:32:33 PM By: Zenaida Deed RN, BSN Entered By: Zenaida Deed on 01/31/2023 11:52:59 -------------------------------------------------------------------------------- Wound Assessment Details Patient Name: Date of Service: [name] M. 01/31/2023 11:15 A M Medical Record Number: [id number] Patient Account Number: [id number] Date of Birth/Sex: Treating RN: Feb 09, 1946 (77 y.o. M) Primary Care Sedale Jenifer: Vilinda Boehringer, DHA NA [facility] Other Clinician: Referring Bethel Sirois: Treating Amish Mintzer/Extender: Estrella Myrtle SHI, DHA NA [facility] Weeks in Treatment: 3 Wound Status SIDAK, GERE ([id number]) [id number] Page 10 of 14 Wound Number: 3 Primary Etiology: Pressure Ulcer Wound Location: Left, Medial Ankle Wound Status: Open Wounding Event: Pressure Injury Comorbid History: Cataracts, Sleep Apnea, Hypertension, Osteoarthritis Date Acquired: 12/03/2022 Weeks Of Treatment: 3 Clustered Wound: No Photos Wound Measurements Length: (cm) 0.1 Width: (cm) 0.1 Depth: (cm) 0.1 Area: (cm) 0.008 Volume: (cm) 0.001 % Reduction in Area: 98.5% % Reduction in Volume: 98.2% Epithelialization: Large (67-100%) Tunneling: No Undermining: No Wound Description Classification: Category/Stage III Wound Margin: Flat and Intact Exudate Amount: None Present Foul Odor After Cleansing: No Slough/Fibrino Yes Wound Bed Granulation Amount: None Present (0%) Exposed Structure Necrotic Amount: Large (67-100%) Fascia Exposed: No Necrotic Quality: Adherent Slough Fat Layer (Subcutaneous Tissue) Exposed: Yes Tendon Exposed:  No Muscle Exposed: No Joint Exposed: No Bone Exposed: No Periwound Skin Texture Texture Color No Abnormalities Noted: Yes No Abnormalities Noted: Yes Moisture Temperature / Pain No Abnormalities Noted: Yes Temperature: No Abnormality Treatment Notes Wound #3 (Ankle) Wound Laterality: Left, Medial Cleanser Peri-Wound Care Topical Primary Dressing Promogran Prisma Matrix, 4.34 (sq in) (silver collagen) Discharge Instruction: Moisten collagen with saline or hydrogel Secondary Dressing Woven Gauze Sponge, Non-Sterile 4x4 in Discharge Instruction: Apply over primary dressing as directed. Secured With Elastic Bandage 4 inch (ACE bandage) Discharge Instruction: Secure with ACE bandage as directed. Kerlix Roll Sterile, 4.5x3.1 (in/yd) Discharge Instruction: Secure with Kerlix as directed. Compression [address] DRAYMOND, SUMP ([id number]) [id number] Page 11 of 14 Compression Stockings Add-Ons Electronic Signature(s) Signed: 01/31/2023 5:32:33 PM By: Zenaida Deed RN, BSN Entered By: Zenaida Deed on 01/31/2023 11:53:35 -------------------------------------------------------------------------------- Wound Assessment Details Patient Name: Date of Service: [name] 01/31/2023 11:15 A M Medical Record Number: [id number] Patient Account Number: [id number] Date of Birth/Sex: Treating RN: [birth year] (77 y.o. M) Primary Care Anisten Tomassi: Vilinda Boehringer, DHA NA [facility] Other Clinician: Referring Kerria Sapien: Treating Jahira Swiss/Extender: Estrella Myrtle SHI, DHA NA [facility] Weeks in Treatment:  3 Wound Status Wound Number: 4 Primary Etiology: Pressure Ulcer Wound Location: Left, Lateral Foot Wound Status: Open Wounding Event: Pressure Injury Comorbid History: Cataracts, Sleep Apnea, Hypertension, Osteoarthritis Date Acquired: 12/03/2022 Weeks Of Treatment: 3 Clustered Wound: No Photos Wound Measurements Length: (cm) 0.4 Width: (cm) 0.3 Depth: (cm)  0.1 Area: (cm) 0.094 Volume: (cm) 0.009 % Reduction in Area: 83.4% % Reduction in Volume: 84.2% Epithelialization: Small (1-33%) Tunneling: No Undermining: No Wound Description Classification: Category/Stage III Wound Margin: Flat and Intact Exudate Amount: Small Exudate Type: Serosanguineous Exudate Color: red, brown Foul Odor After Cleansing: No Slough/Fibrino Yes Wound Bed Granulation Amount: Large (67-100%) Exposed Structure Granulation Quality: Red, Pink Fascia Exposed: No Necrotic Amount: Small (1-33%) Fat Layer (Subcutaneous Tissue) Exposed: Yes Necrotic Quality: Adherent Slough Tendon Exposed: No Muscle Exposed: No Joint Exposed: No Bone Exposed: No Periwound Skin Texture Texture Color No Abnormalities Noted: Yes No Abnormalities Noted: Yes Moisture Temperature / Pain SHOLOM, BULA ([id number]) [id number] Page 12 of 14 No Abnormalities Noted: Yes Temperature: No Abnormality Tenderness on Palpation: Yes Treatment Notes Wound #4 (Foot) Wound Laterality: Left, Lateral Cleanser Peri-Wound Care Topical Primary Dressing Promogran Prisma Matrix, 4.34 (sq in) (silver collagen) Discharge Instruction: Moisten collagen with saline or hydrogel Secondary Dressing Woven Gauze Sponge, Non-Sterile 4x4 in Discharge Instruction: Apply over primary dressing as directed. Secured With Elastic Bandage 4 inch (ACE bandage) Discharge Instruction: Secure with ACE bandage as directed. Kerlix Roll Sterile, 4.5x3.1 (in/yd) Discharge Instruction: Secure with Kerlix as directed. Compression Wrap Compression Stockings Add-Ons Electronic Signature(s) Signed: 01/31/2023 5:32:33 PM By: Zenaida Deed RN, BSN Entered By: Zenaida Deed on 01/31/2023 11:54:11 -------------------------------------------------------------------------------- Wound Assessment Details Patient Name: Date of Service: [name] M. 01/31/2023 11:15 A M Medical  Record Number: [id number] Patient Account Number: [id number] Date of Birth/Sex: Treating RN: Nov 13, 1945 (77 y.o. [name] Primary Care Onelia Cadmus: Vilinda Boehringer, DHA NA [facility] Other Clinician: Referring Declynn Lopresti: Treating Axxel Gude/Extender: Estrella Myrtle SHI, DHA NA SHREE Weeks in Treatment: 3 Wound Status Wound Number: 6 Primary Etiology: Pressure Ulcer Wound Location: Right Calcaneus Wound Status: Open Wounding Event: Pressure Injury Comorbid History: Cataracts, Sleep Apnea, Hypertension, Osteoarthritis Date Acquired: 12/03/2022 Weeks Of Treatment: 3 Clustered Wound: No Photos HAEDYN, ODLE ([id number]) [id number] Page 13 of 14 Wound Measurements Length: (cm) 0.8 Width: (cm) 1.4 Depth: (cm) 0.1 Area: (cm) 0.88 Volume: (cm) 0.088 % Reduction in Area: 87.7% % Reduction in Volume: 87.7% Epithelialization: Medium (34-66%) Tunneling: No Undermining: No Wound Description Classification: Category/Stage III Wound Margin: Flat and Intact Exudate Amount: Small Exudate Type: Serosanguineous Exudate Color: red, brown Foul Odor After Cleansing: No Slough/Fibrino Yes Wound Bed Granulation Amount: Large (67-100%) Exposed Structure Granulation Quality: Red, Pink Fascia Exposed: No Necrotic Amount: Small (1-33%) Fat Layer (Subcutaneous Tissue) Exposed: Yes Necrotic Quality: Adherent Slough Tendon Exposed: No Muscle Exposed: No Joint Exposed: No Bone Exposed: No Periwound Skin Texture Texture Color No Abnormalities Noted: Yes No Abnormalities Noted: Yes Moisture Temperature / Pain No Abnormalities Noted: No Temperature: No Abnormality Dry / Scaly: Yes Treatment Notes Wound #6 (Calcaneus) Wound Laterality: Right Cleanser Peri-Wound Care Topical Primary Dressing Promogran Prisma Matrix, 4.34 (sq in) (silver collagen) Discharge Instruction: Moisten collagen with saline or hydrogel Secondary Dressing Woven Gauze Sponge, Non-Sterile  4x4 in Discharge Instruction: Apply over primary dressing as directed. Secured With Elastic Bandage 4 inch (ACE bandage) Discharge Instruction: Secure with ACE bandage as directed. Kerlix Roll Sterile, 4.5x3.1 (in/yd) Discharge Instruction: Secure with Kerlix as directed. Compression Wrap Compression Stockings Add-Ons Electronic  Signature(s) Signed: 01/31/2023 5:32:33 PM By: Zenaida Deed RN, BSN Entered By: Zenaida Deed on 01/31/2023 11:54:56 Vitals Details -------------------------------------------------------------------------------- Elenore Paddy ([id number]) [id number] Page 14 of 14 Patient Name: Date of Service: [facility] [name] 01/31/2023 11:15 A M Medical Record Number: [id number] Patient Account Number: [id number] Date of Birth/Sex: Treating RN: 28-Feb-1946 (77 y.o. M) Primary Care Shayonna Ocampo: Vilinda Boehringer, DHA NA SHREE Other Clinician: Referring Nivedita Mirabella: Treating Johnta Couts/Extender: Estrella Myrtle SHI, DHA NA SHREE Weeks in Treatment: 3 Vital Signs Time Taken: 11:28 Temperature (F): 97.8 Height (in): 72 Pulse (bpm): 64 Weight (lbs): 240 Respiratory Rate (breaths/min): 18 Body Mass Index (BMI): 32.5 Blood Pressure (mmHg): 144/65 Reference Range: 80 - 120 mg / dl Electronic Signature(s) Signed: 01/31/2023 5:32:33 PM By: Zenaida Deed RN, BSN Entered By: Zenaida Deed on 01/31/2023 11:45:43

## 2023-02-02 ENCOUNTER — Encounter (HOSPITAL_BASED_OUTPATIENT_CLINIC_OR_DEPARTMENT_OTHER): Payer: Medicare Other | Admitting: Physical Therapy

## 2023-02-06 ENCOUNTER — Ambulatory Visit (HOSPITAL_BASED_OUTPATIENT_CLINIC_OR_DEPARTMENT_OTHER): Payer: Medicare Other | Admitting: Physical Therapy

## 2023-02-07 ENCOUNTER — Encounter (HOSPITAL_BASED_OUTPATIENT_CLINIC_OR_DEPARTMENT_OTHER): Payer: Medicare Other | Attending: General Surgery | Admitting: General Surgery

## 2023-02-07 DIAGNOSIS — Z9049 Acquired absence of other specified parts of digestive tract: Secondary | ICD-10-CM | POA: Insufficient documentation

## 2023-02-07 DIAGNOSIS — L89613 Pressure ulcer of right heel, stage 3: Secondary | ICD-10-CM | POA: Insufficient documentation

## 2023-02-07 DIAGNOSIS — L97322 Non-pressure chronic ulcer of left ankle with fat layer exposed: Secondary | ICD-10-CM | POA: Diagnosis present

## 2023-02-07 DIAGNOSIS — Z96653 Presence of artificial knee joint, bilateral: Secondary | ICD-10-CM | POA: Diagnosis not present

## 2023-02-07 DIAGNOSIS — Z981 Arthrodesis status: Secondary | ICD-10-CM | POA: Insufficient documentation

## 2023-02-07 DIAGNOSIS — L89623 Pressure ulcer of left heel, stage 3: Secondary | ICD-10-CM | POA: Diagnosis not present

## 2023-02-07 DIAGNOSIS — L89523 Pressure ulcer of left ankle, stage 3: Secondary | ICD-10-CM | POA: Diagnosis not present

## 2023-02-07 DIAGNOSIS — G629 Polyneuropathy, unspecified: Secondary | ICD-10-CM | POA: Insufficient documentation

## 2023-02-07 NOTE — Progress Notes (Signed)
CHOU, BELDIN ([id number]) [id number] Page 1 of 12 Visit Report for 02/07/2023 Arrival Information Details Patient Name: Date of Service: [facility] [name] 02/07/2023 11:15 A M Medical Record Number: [id number] Patient Account Number: [id number] Date of Birth/Sex: Treating RN: 1946-03-13 (77 y.o. [name], [name] Primary Care Jaren Kearn: Vilinda Boehringer, DHA NA [facility] Other Clinician: Referring Austan Nicholl: Treating Shadia Larose/Extender: Estrella Myrtle SHI, DHA NA [facility] Weeks in Treatment: 4 Visit Information History Since Last Visit Added or deleted any medications: No Patient Arrived: Wheel Chair Any new allergies or adverse reactions: No Arrival Time: 11:16 Had a fall or experienced change in No Accompanied By: spouse activities of daily living that may affect Transfer Assistance: None risk of falls: Patient Identification Verified: Yes Signs or symptoms of abuse/neglect since last visito No Secondary Verification Process Completed: Yes Hospitalized since last visit: No Patient Requires Transmission-Based Precautions: No Implantable device outside of the clinic excluding No Patient Has Alerts: No cellular tissue based products placed in the center since last visit: Has Dressing in Place as Prescribed: Yes Pain Present Now: No Electronic Signature(s) Signed: 02/07/2023 3:51:43 PM By: Zenaida Deed RN, BSN Entered By: Zenaida Deed on 02/07/2023 11:40:03 -------------------------------------------------------------------------------- Encounter Discharge Information Details Patient Name: Date of Service: [name] M. 02/07/2023 11:15 A M Medical Record Number: [id number] Patient Account Number: [id number] Date of Birth/Sex: Treating RN: [birth year] (77 y.o. [name] Primary Care Deloyce Walthers: Vilinda Boehringer, DHA NA [facility] Other Clinician: Referring Maysie Parkhill: Treating Dollie Mayse/Extender: Estrella Myrtle SHI, DHA NA [facility] Weeks in  Treatment: 4 Encounter Discharge Information Items Post Procedure Vitals Discharge Condition: Stable Temperature (F): 98.6 Ambulatory Status: Wheelchair Pulse (bpm): 64 Discharge Destination: Home Respiratory Rate (breaths/min): 18 Transportation: Private Auto Blood Pressure (mmHg): 138/66 Accompanied By: spouse Schedule Follow-up Appointment: Yes Clinical Summary of Care: Patient Declined Electronic Signature(s) Signed: 02/07/2023 3:51:43 PM By: Zenaida Deed RN, BSN Entered By: Zenaida Deed on 02/07/2023 12:09:36 Elenore Paddy ([id number]) [id number] Page 2 of 12 -------------------------------------------------------------------------------- Lower Extremity Assessment Details Patient Name: Date of Service: [facility] [name] 02/07/2023 11:15 A M Medical Record Number: [id number] Patient Account Number: [id number] Date of Birth/Sex: Treating RN: 10-31-1945 (77 y.o. [name] Primary Care Velmer Woelfel: Vilinda Boehringer, DHA NA [facility] Other Clinician: Referring Cannon Arreola: Treating Rebel Willcutt/Extender: Estrella Myrtle SHI, DHA NA [facility] Weeks in Treatment: 4 Edema Assessment Assessed: [Left: No] [Right: No] Edema: [Left: Yes] [Right: Yes] Calf Left: Right: Point of Measurement: From Medial Instep 38.5 cm 39.5 cm Ankle Left: Right: Point of Measurement: From Medial Instep 28.1 cm 27 cm Vascular Assessment Pulses: Dorsalis Pedis Palpable: [Left:Yes] [Right:Yes] Extremity colors, hair growth, and conditions: Extremity Color: [Left:Normal] [Right:Normal] Hair Growth on Extremity: [Left:Yes] [Right:Yes] Temperature of Extremity: [Left:Warm] [Right:Warm] Capillary Refill: [Left:< 3 seconds] [Right:< 3 seconds] Dependent Rubor: [Left:No No] [Right:No No] Electronic Signature(s) Signed: 02/07/2023 3:51:43 PM By: Zenaida Deed RN, BSN Entered By: Zenaida Deed on 02/07/2023  11:27:12 -------------------------------------------------------------------------------- Multi Wound Chart Details Patient Name: Date of Service: [name] M. 02/07/2023 11:15 A M Medical Record Number: [id number] Patient Account Number: [id number] Date of Birth/Sex: Treating RN: 1945-09-01 (77 y.o. [name] Primary Care Halo Shevlin: Vilinda Boehringer, DHA NA [facility] Other Clinician: Referring Kayelynn Abdou: Treating Dorena Dorfman/Extender: Estrella Myrtle SHI, DHA NA [facility] Weeks in Treatment: 4 Vital Signs Height(in): 72 Pulse(bpm): 64 Weight(lbs): 240 Blood Pressure(mmHg): 138/66 Body Mass Index(BMI): 32.5 Temperature(F): 98.6 Respiratory Rate(breaths/min): 18 [2:Photos:] [[id number] Page 3 of 12] Left, Posterior Lower  Leg Left, Medial Ankle Left, Lateral Foot Wound Location: Pressure Injury Pressure Injury Pressure Injury Wounding Event: Pressure Ulcer Pressure Ulcer Pressure Ulcer Primary Etiology: Cataracts, Sleep Apnea, Hypertension, Cataracts, Sleep Apnea, Hypertension, Cataracts, Sleep Apnea, Hypertension, Comorbid History: Osteoarthritis Osteoarthritis Osteoarthritis 12/03/2022 12/03/2022 12/03/2022 Date Acquired: 4 4 4  Weeks of Treatment: Open Healed - Epithelialized Open Wound Status: No No No Wound Recurrence: 3.6x1.8x0.1 0x0x0 0.2x0.2x0.1 Measurements L x W x D (cm) 5.089 0 0.031 A (cm) : rea 0.509 0 0.003 Volume (cm) : -332.00% 100.00% 94.50% % Reduction in A rea: -331.40% 100.00% 94.70% % Reduction in Volume: Category/Stage III Category/Stage III Category/Stage III Classification: Medium None Present None Present Exudate A mount: Serosanguineous N/A N/A Exudate Type: red, brown N/A N/A Exudate Color: Distinct, outline attached N/A Flat and Intact Wound Margin: Small (1-33%) None Present (0%) None Present (0%) Granulation A mount: Red N/A N/A Granulation Quality: Large (67-100%) None Present (0%) None Present  (0%) Necrotic A mount: Fat Layer (Subcutaneous Tissue): Yes Fascia: No Fat Layer (Subcutaneous Tissue): Yes Exposed Structures: Fascia: No Fat Layer (Subcutaneous Tissue): No Fascia: No Tendon: No Tendon: No Tendon: No Muscle: No Muscle: No Muscle: No Joint: No Joint: No Joint: No Bone: No Bone: No Bone: No Small (1-33%) Large (67-100%) Large (67-100%) Epithelialization: Debridement - Selective/Open Wound N/A N/A Debridement: Pre-procedure Verification/Time Out 11:45 N/A N/A Taken: Lidocaine 4% Topical Solution N/A N/A Pain Control: Slough N/A N/A Tissue Debrided: Non-Viable Tissue N/A N/A Level: 5.09 N/A N/A Debridement A (sq cm): rea Curette N/A N/A Instrument: Large N/A N/A Bleeding: Pressure N/A N/A Hemostasis A chieved: 0 N/A N/A Procedural Pain: 0 N/A N/A Post Procedural Pain: Procedure was tolerated well N/A N/A Debridement Treatment Response: 3.6x1.8x0.1 N/A N/A Post Debridement Measurements L x W x D (cm) 0.509 N/A N/A Post Debridement Volume: (cm) Category/Stage III N/A N/A Post Debridement Stage: No Abnormalities Noted No Abnormalities Noted No Abnormalities Noted Periwound Skin Texture: No Abnormalities Noted No Abnormalities Noted No Abnormalities Noted Periwound Skin Moisture: No Abnormalities Noted No Abnormalities Noted No Abnormalities Noted Periwound Skin Color: No Abnormality No Abnormality No Abnormality Temperature: N/A N/A Yes Tenderness on Palpation: Debridement N/A N/A Procedures Performed: Wound Number: 6 N/A N/A Photos: N/A N/A Right Calcaneus N/A N/A Wound Location: Pressure Injury N/A N/A Wounding Event: Pressure Ulcer N/A N/A Primary Etiology: Cataracts, Sleep Apnea, Hypertension, N/A N/A Comorbid History: Osteoarthritis 12/03/2022 N/A N/A Date Acquired: 4 N/A N/A Weeks of Treatment: Open N/A N/A Wound Status: No N/A N/A Wound Recurrence: 0.3x0.6x0.1 N/A N/A Measurements L x W x D (cm) 0.141 N/A N/A A  (cm) : rea 0.014 N/A N/A Volume (cm) : 98.00% N/A N/A % Reduction in Area: SOMTOCHUKWU, HIRACHETA ([id number]) [id number] Page 4 of 12 98.00% N/A N/A % Reduction in Volume: Category/Stage III N/A N/A Classification: Small N/A N/A Exudate A mount: Serosanguineous N/A N/A Exudate Type: red, brown N/A N/A Exudate Color: Flat and Intact N/A N/A Wound Margin: Large (67-100%) N/A N/A Granulation A mount: Red N/A N/A Granulation Quality: None Present (0%) N/A N/A Necrotic A mount: Fat Layer (Subcutaneous Tissue): Yes N/A N/A Exposed Structures: Fascia: No Tendon: No Muscle: No Joint: No Bone: No Medium (34-66%) N/A N/A Epithelialization: N/A N/A N/A Debridement: N/A N/A N/A Pain Control: N/A N/A N/A Tissue Debrided: N/A N/A N/A Level: N/A N/A N/A Debridement A (sq cm): rea N/A N/A N/A Instrument: N/A N/A N/A Bleeding: N/A N/A N/A Hemostasis A chieved: N/A N/A N/A Procedural Pain: N/A N/A N/A Post Procedural  Pain: Debridement Treatment Response: N/A N/A N/A Post Debridement Measurements L x N/A N/A N/A W x D (cm) N/A N/A N/A Post Debridement Volume: (cm) N/A N/A N/A Post Debridement Stage: Callus: Yes N/A N/A Periwound Skin Texture: Dry/Scaly: Yes N/A N/A Periwound Skin Moisture: No Abnormalities Noted N/A N/A Periwound Skin Color: No Abnormality N/A N/A Temperature: N/A N/A N/A Procedures Performed: Treatment Notes Electronic Signature(s) Signed: 02/07/2023 11:51:15 AM By: Duanne Guess MD FACS Signed: 02/07/2023 3:51:43 PM By: Zenaida Deed RN, BSN Entered By: Duanne Guess on 02/07/2023 11:51:15 -------------------------------------------------------------------------------- Multi-Disciplinary Care Plan Details Patient Name: Date of Service: Orlinda Blalock MSO Rockne Coons M M. 02/07/2023 11:15 A M Medical Record Number: [id number] Patient Account Number: [id number] Date of Birth/Sex: Treating RN: 1946-01-23 (77 y.o. [name] Primary Care Aengus Sauceda: Vilinda Boehringer, DHA NA [facility] Other Clinician: Referring Chetara Kropp: Treating Daveah Varone/Extender: Estrella Myrtle SHI, DHA NA [facility] Weeks in Treatment: 4 Multidisciplinary Care Plan reviewed with physician Active Inactive Abuse / Safety / Falls / Self Care Management Nursing Diagnoses: Potential for falls Goals: Patient/caregiver will verbalize/demonstrate measures taken to prevent injury and/or falls Date Initiated: 01/09/2023 Target Resolution Date: 03/06/2023 Goal Status: Active Interventions: Assess fall risk on admission and as needed KOLSYN, WESCOTT ([id number]) [id number] Page 5 of 12 Assess: immobility, friction, shearing, incontinence upon admission and as needed Assess impairment of mobility on admission and as needed per policy Notes: Pressure Nursing Diagnoses: Knowledge deficit related to causes and risk factors for pressure ulcer development Knowledge deficit related to management of pressures ulcers Potential for impaired tissue integrity related to pressure, friction, moisture, and shear Goals: Patient/caregiver will verbalize understanding of pressure ulcer management Date Initiated: 01/09/2023 Target Resolution Date: 03/06/2023 Goal Status: Active Interventions: Assess: immobility, friction, shearing, incontinence upon admission and as needed Assess offloading mechanisms upon admission and as needed Assess potential for pressure ulcer upon admission and as needed Notes: Wound/Skin Impairment Nursing Diagnoses: Impaired tissue integrity Knowledge deficit related to ulceration/compromised skin integrity Goals: Patient/caregiver will verbalize understanding of skin care regimen Date Initiated: 01/09/2023 Target Resolution Date: 03/06/2023 Goal Status: Active Ulcer/skin breakdown will have a volume reduction of 30% by week 4 Date Initiated: 01/09/2023 Date Inactivated: 02/07/2023 Target Resolution Date:  02/06/2023 Goal Status: Met Ulcer/skin breakdown will have a volume reduction of 50% by week 8 Date Initiated: 02/07/2023 Target Resolution Date: 03/06/2023 Goal Status: Active Interventions: Assess patient/caregiver ability to obtain necessary supplies Assess patient/caregiver ability to perform ulcer/skin care regimen upon admission and as needed Assess ulceration(s) every visit Provide education on ulcer and skin care Treatment Activities: Skin care regimen initiated : 01/09/2023 Topical wound management initiated : 01/09/2023 Notes: Electronic Signature(s) Signed: 02/07/2023 3:51:43 PM By: Zenaida Deed RN, BSN Entered By: Zenaida Deed on 02/07/2023 11:38:56 -------------------------------------------------------------------------------- Pain Assessment Details Patient Name: Date of Service: [name] M. 02/07/2023 11:15 A M Medical Record Number: [id number] Patient Account Number: [id number] Date of Birth/Sex: Treating RN: September 02, 1945 (77 y.o. [name] Primary Care Lavanya Roa: Vilinda Boehringer, [facility] NA [facility] Other Clinician: Referring Lucciano Vitali: Treating Jiselle Sheu/Extender: Estrella Myrtle SHI, DHA NA [facility] Weeks in Treatment: [address] HIEU, RIEKEN ([id number]) [id number] Page 6 of 12 Location of Pain Severity and Description of Pain Patient Has Paino No Site Locations Rate the pain. Current Pain Level: 0 Pain Management and Medication Current Pain Management: Electronic Signature(s) Signed: 02/07/2023 3:51:43 PM By: Zenaida Deed RN, BSN Entered By: Zenaida Deed on 02/07/2023 11:18:10 -------------------------------------------------------------------------------- Patient/Caregiver Education Details Patient Name: Date of Service:  [facility] MSO [name] 8/6/2024andnbsp11:15 A M Medical Record Number: [id number] Patient Account Number: [id number] Date of Birth/Gender: Treating RN: Jul 19, 1945 (77 y.o. [name] Primary Care Physician: Vilinda Boehringer, DHA NA [facility] Other Clinician: Referring Physician: Treating Physician/Extender: Estrella Myrtle SHI, DHA NA [facility] Weeks in Treatment: 4 Education Assessment Education Provided To: Patient Education Topics Provided Offloading: Methods: Explain/Verbal Responses: Reinforcements needed, State content correctly Pressure: Methods: Explain/Verbal Responses: Reinforcements needed, State content correctly Wound/Skin Impairment: Methods: Explain/Verbal Responses: Reinforcements needed, State content correctly Electronic Signature(s) Signed: 02/07/2023 3:51:43 PM By: Zenaida Deed RN, BSN Entered By: Zenaida Deed on 02/07/2023 11:39:41 Elenore Paddy ([id number]) [id number] Page 7 of 12 -------------------------------------------------------------------------------- Wound Assessment Details Patient Name: Date of Service: [facility] [name] 02/07/2023 11:15 A M Medical Record Number: [id number] Patient Account Number: [id number] Date of Birth/Sex: Treating RN: 1945/09/16 (77 y.o. [name] Primary Care Hayly Litsey: Vilinda Boehringer, DHA NA [facility] Other Clinician: Referring Bradie Sangiovanni: Treating Caleigha Zale/Extender: Estrella Myrtle SHI, DHA NA [facility] Weeks in Treatment: 4 Wound Status Wound Number: 2 Primary Etiology: Pressure Ulcer Wound Location: Left, Posterior Lower Leg Wound Status: Open Wounding Event: Pressure Injury Comorbid History: Cataracts, Sleep Apnea, Hypertension, Osteoarthritis Date Acquired: 12/03/2022 Weeks Of Treatment: 4 Clustered Wound: No Photos Wound Measurements Length: (cm) 3 Width: (cm) 1 Depth: (cm) 0 Area: (cm) Volume: (cm) .6 % Reduction in Area: -332% .8 % Reduction in Volume: -331.4% .1 Epithelialization: Small (1-33%) 5.089 Tunneling: No 0.509 Undermining: No Wound Description Classification: Category/Stage III Wound Margin: Distinct, outline attached Exudate Amount:  Medium Exudate Type: Serosanguineous Exudate Color: red, brown Foul Odor After Cleansing: No Slough/Fibrino Yes Wound Bed Granulation Amount: Small (1-33%) Exposed Structure Granulation Quality: Red Fascia Exposed: No Necrotic Amount: Large (67-100%) Fat Layer (Subcutaneous Tissue) Exposed: Yes Necrotic Quality: Adherent Slough Tendon Exposed: No Muscle Exposed: No Joint Exposed: No Bone Exposed: No Periwound Skin Texture Texture Color No Abnormalities Noted: Yes No Abnormalities Noted: Yes Moisture Temperature / Pain No Abnormalities Noted: Yes Temperature: No Abnormality Treatment Notes Wound #2 (Lower Leg) Wound Laterality: Left, Posterior Cleanser Peri-Wound Care ABHILASH, EMENS ([id number]) [id number] Page 8 of 12 Topical Primary Dressing Hydrofera Blue Ready Transfer Foam, 2.5x2.5 (in/in) Discharge Instruction: Apply directly to wound bed as directed Santyl Ointment Discharge Instruction: Apply nickel thick amount to wound bed as instructed Secondary Dressing Woven Gauze Sponge, Non-Sterile 4x4 in Discharge Instruction: Apply over primary dressing as directed. Secured With Elastic Bandage 4 inch (ACE bandage) Discharge Instruction: Secure with ACE bandage as directed. Kerlix Roll Sterile, 4.5x3.1 (in/yd) Discharge Instruction: Secure with Kerlix as directed. Compression Wrap Compression Stockings Add-Ons Electronic Signature(s) Signed: 02/07/2023 3:51:43 PM By: Zenaida Deed RN, BSN Entered By: Zenaida Deed on 02/07/2023 11:35:32 -------------------------------------------------------------------------------- Wound Assessment Details Patient Name: Date of Service: [name] M. 02/07/2023 11:15 A M Medical Record Number: [id number] Patient Account Number: [id number] Date of Birth/Sex: Treating RN: 17-Mar-1946 (77 y.o. [name] Primary Care Pualani Borah: Vilinda Boehringer, DHA NA [facility] Other Clinician: Referring  Latravia Southgate: Treating Zalman Hull/Extender: Estrella Myrtle SHI, DHA NA SHREE Weeks in Treatment: 4 Wound Status Wound Number: 3 Primary Etiology: Pressure Ulcer Wound Location: Left, Medial Ankle Wound Status: Healed - Epithelialized Wounding Event: Pressure Injury Comorbid History: Cataracts, Sleep Apnea, Hypertension, Osteoarthritis Date Acquired: 12/03/2022 Weeks Of Treatment: 4 Clustered Wound: No Photos Wound Measurements Length: (cm) 0 Width: (cm) 0 Depth: (cm) 0 Area: (cm) 0 Volume: (cm) 0 MIHAILO, ALBRECHT ([id number]) Wound Description Classification:  Category/Stage III Exudate Amount: None Present Foul Odor After Cleansing: No Slough/Fibrino No % Reduction in Area: 100% % Reduction in Volume: 100% Epithelialization: Large (67-100%) Tunneling: No Undermining: No [id number] Page 9 of 12 Wound Bed Granulation Amount: None Present (0%) Exposed Structure Necrotic Amount: None Present (0%) Fascia Exposed: No Fat Layer (Subcutaneous Tissue) Exposed: No Tendon Exposed: No Muscle Exposed: No Joint Exposed: No Bone Exposed: No Periwound Skin Texture Texture Color No Abnormalities Noted: Yes No Abnormalities Noted: Yes Moisture Temperature / Pain No Abnormalities Noted: Yes Temperature: No Abnormality Electronic Signature(s) Signed: 02/07/2023 3:51:43 PM By: Zenaida Deed RN, BSN Entered By: Zenaida Deed on 02/07/2023 11:36:35 -------------------------------------------------------------------------------- Wound Assessment Details Patient Name: Date of Service: [name] M. 02/07/2023 11:15 A M Medical Record Number: [id number] Patient Account Number: [id number] Date of Birth/Sex: Treating RN: [birth year] (77 y.o. [name] Primary Care Lavonn Maxcy: Vilinda Boehringer, DHA NA [facility] Other Clinician: Referring Uriel Dowding: Treating Farah Lepak/Extender: Estrella Myrtle SHI, DHA NA [facility] Weeks in Treatment: 4 Wound  Status Wound Number: 4 Primary Etiology: Pressure Ulcer Wound Location: Left, Lateral Foot Wound Status: Open Wounding Event: Pressure Injury Comorbid History: Cataracts, Sleep Apnea, Hypertension, Osteoarthritis Date Acquired: 12/03/2022 Weeks Of Treatment: 4 Clustered Wound: No Photos Wound Measurements Length: (cm) 0.2 Width: (cm) 0.2 Depth: (cm) 0.1 Area: (cm) 0.031 Volume: (cm) 0.003 % Reduction in Area: 94.5% % Reduction in Volume: 94.7% Epithelialization: Large (67-100%) Tunneling: No Undermining: No Wound Description Classification: Category/Stage III CASTEN, GREGORICH ([id number]) Wound Margin: Flat and Intact Exudate Amount: None Present Foul Odor After Cleansing: No [id number] Page 10 of 12 Slough/Fibrino No Wound Bed Granulation Amount: None Present (0%) Exposed Structure Necrotic Amount: None Present (0%) Fascia Exposed: No Fat Layer (Subcutaneous Tissue) Exposed: Yes Tendon Exposed: No Muscle Exposed: No Joint Exposed: No Bone Exposed: No Periwound Skin Texture Texture Color No Abnormalities Noted: Yes No Abnormalities Noted: Yes Moisture Temperature / Pain No Abnormalities Noted: Yes Temperature: No Abnormality Tenderness on Palpation: Yes Treatment Notes Wound #4 (Foot) Wound Laterality: Left, Lateral Cleanser Peri-Wound Care Topical Primary Dressing Maxorb Extra Ag+ Alginate Dressing, 4x4.75 (in/in) Discharge Instruction: Apply to wound bed as instructed Secondary Dressing Woven Gauze Sponge, Non-Sterile 4x4 in Discharge Instruction: Apply over primary dressing as directed. Secured With Elastic Bandage 4 inch (ACE bandage) Discharge Instruction: Secure with ACE bandage as directed. Kerlix Roll Sterile, 4.5x3.1 (in/yd) Discharge Instruction: Secure with Kerlix as directed. Compression Wrap Compression Stockings Add-Ons Electronic Signature(s) Signed: 02/07/2023 3:51:43 PM By: Zenaida Deed RN, BSN Entered  By: Zenaida Deed on 02/07/2023 11:37:09 -------------------------------------------------------------------------------- Wound Assessment Details Patient Name: Date of Service: [name] M. 02/07/2023 11:15 A M Medical Record Number: [id number] Patient Account Number: [id number] Date of Birth/Sex: Treating RN: June 15, 1946 (77 y.o. [name] Primary Care Iviona Hole: Vilinda Boehringer, DHA NA [facility] Other Clinician: Referring Zury Fazzino: Treating Quinlin Conant/Extender: Estrella Myrtle SHI, DHA NA SHREE Weeks in Treatment: 4 Wound Status Wound Number: 6 Primary Etiology: Pressure Ulcer Wound Location: Right Calcaneus Wound Status: Open Wounding Event: Pressure Injury Comorbid History: Cataracts, Sleep Apnea, Hypertension, Osteoarthritis Date Acquired: 12/03/2022 Elenore Paddy ([id number]) [id number] Page 11 of 12 Weeks Of Treatment: 4 Clustered Wound: No Photos Wound Measurements Length: (cm) 0.3 Width: (cm) 0.6 Depth: (cm) 0.1 Area: (cm) 0.141 Volume: (cm) 0.014 % Reduction in Area: 98% % Reduction in Volume: 98% Epithelialization: Medium (34-66%) Tunneling: No Undermining: No Wound Description Classification: Category/Stage III Wound Margin: Flat and Intact Exudate Amount: Small Exudate Type: Serosanguineous  Exudate Color: red, brown Foul Odor After Cleansing: No Slough/Fibrino No Wound Bed Granulation Amount: Large (67-100%) Exposed Structure Granulation Quality: Red Fascia Exposed: No Necrotic Amount: None Present (0%) Fat Layer (Subcutaneous Tissue) Exposed: Yes Tendon Exposed: No Muscle Exposed: No Joint Exposed: No Bone Exposed: No Periwound Skin Texture Texture Color No Abnormalities Noted: Yes No Abnormalities Noted: Yes Moisture Temperature / Pain No Abnormalities Noted: No Temperature: No Abnormality Dry / Scaly: Yes Treatment Notes Wound #6 (Calcaneus) Wound Laterality: Right Cleanser Peri-Wound  Care Topical Primary Dressing Maxorb Extra Ag+ Alginate Dressing, 4x4.75 (in/in) Discharge Instruction: Apply to wound bed as instructed Secondary Dressing Woven Gauze Sponge, Non-Sterile 4x4 in Discharge Instruction: Apply over primary dressing as directed. Secured With Elastic Bandage 4 inch (ACE bandage) Discharge Instruction: Secure with ACE bandage as directed. Kerlix Roll Sterile, 4.5x3.1 (in/yd) Discharge Instruction: Secure with Kerlix as directed. Compression Wrap Compression Stockings MARKEE, LIENEMANN ([id number]) [id number] Page 12 of 12 Add-Ons Electronic Signature(s) Signed: 02/07/2023 3:51:43 PM By: Zenaida Deed RN, BSN Entered By: Zenaida Deed on 02/07/2023 11:37:40 -------------------------------------------------------------------------------- Vitals Details Patient Name: Date of Service: [name] M. 02/07/2023 11:15 A M Medical Record Number: [id number] Patient Account Number: [id number] Date of Birth/Sex: Treating RN: 05/14/1946 (77 y.o. [name] Primary Care Vianney Kopecky: Vilinda Boehringer, DHA NA [facility] Other Clinician: Referring Ashawnti Tangen: Treating Demarus Latterell/Extender: Estrella Myrtle SHI, DHA NA [facility] Weeks in Treatment: 4 Vital Signs Time Taken: 11:17 Temperature (F): 98.6 Height (in): 72 Pulse (bpm): 64 Weight (lbs): 240 Respiratory Rate (breaths/min): 18 Body Mass Index (BMI): 32.5 Blood Pressure (mmHg): 138/66 Reference Range: 80 - 120 mg / dl Electronic Signature(s) Signed: 02/07/2023 3:51:43 PM By: Zenaida Deed RN, BSN Entered By: Zenaida Deed on 02/07/2023 11:18:02

## 2023-02-07 NOTE — Progress Notes (Addendum)
DRAYKE, FINAMORE ([id number]) [id number] Page 1 of 10 Visit Report for 02/07/2023 Chief Complaint Document Details Patient Name: Date of Service: [facility] [name]. 02/07/2023 11:15 A M Medical Record Number: [id number] Patient Account Number: [id number] Date of Birth/Sex: Treating RN: June 19, 1946 (77 y.o. [name] Primary Care Provider: Vilinda Boehringer, DHA NA [facility] Other Clinician: Referring Provider: Treating Provider/Extender: Estrella Myrtle SHI, DHA NA [facility] Weeks in Treatment: 4 Information Obtained from: Patient Chief Complaint Patient presents to the wound care center with open non-healing surgical wound(s) and pressure ulcers Electronic Signature(s) Signed: 02/07/2023 11:55:01 AM By: Duanne Guess MD FACS Entered By: Duanne Guess on 02/07/2023 11:55:01 -------------------------------------------------------------------------------- Debridement Details Patient Name: Date of Service: Neil Pearson M M. 02/07/2023 11:15 A M Medical Record Number: [id number] Patient Account Number: [id number] Date of Birth/Sex: Treating RN: 1946-04-14 (77 y.o. [name] Primary Care Provider: Vilinda Boehringer, DHA NA [facility] Other Clinician: Referring Provider: Treating Provider/Extender: Estrella Myrtle SHI, DHA NA [facility] Weeks in Treatment: 4 Debridement Performed for Assessment: Wound #2 Left,Posterior Lower Leg Performed By: Physician Duanne Guess, MD Debridement Type: Debridement Level of Consciousness (Pre-procedure): Awake and Alert Pre-procedure Verification/Time Out Yes - 11:45 Taken: Start Time: 11:47 Pain Control: Lidocaine 4% T opical Solution Percent of Wound Bed Debrided: 100% T Area Debrided (cm): otal 5.09 Tissue and other material debrided: Non-Viable, Slough, Slough Level: Non-Viable Tissue Debridement Description: Selective/Open Wound Instrument: Curette Bleeding: Large Hemostasis Achieved: Pressure Procedural  Pain: 0 Post Procedural Pain: 0 Response to Treatment: Procedure was tolerated well Level of Consciousness (Post- Awake and Alert procedure): Post Debridement Measurements of Total Wound Length: (cm) 3.6 Stage: Category/Stage III Width: (cm) 1.8 Depth: (cm) 0.1 Volume: (cm) 0.509 Character of Wound/Ulcer Post Debridement: Improved Post Procedure Diagnosis [name], [name] ([id number]) [id number] Page 2 of 10 Same as Pre-procedure Notes Scribed for Dr. Lady Gary by Zenaida Deed, RN Electronic Signature(s) Signed: 02/07/2023 12:07:14 PM By: Duanne Guess MD FACS Signed: 02/07/2023 3:51:43 PM By: Zenaida Deed RN, BSN Entered By: Zenaida Deed on 02/07/2023 11:49:06 -------------------------------------------------------------------------------- HPI Details Patient Name: Date of Service: Neil Pearson M M. 02/07/2023 11:15 A M Medical Record Number: [id number] Patient Account Number: [id number] Date of Birth/Sex: Treating RN: [birth year] (77 y.o. [name] Primary Care Provider: Vilinda Boehringer, DHA NA [facility] Other Clinician: Referring Provider: Treating Provider/Extender: Estrella Myrtle SHI, DHA NA [facility] Weeks in Treatment: 4 History of Present Illness HPI Description: ADMISSION 01/09/2023 This is a 77 year old known diabetic who underwent a left ankle ORIF on Nov 26, 2022. He was seen by orthopedic surgery for follow-up on December 26, 2022 and was noted to have breakdown of his left lateral leg incision. He was also given a course of Keflex at that time. He also developed pressure ulcers on his posterior Achilles area, his lateral calcaneus, and his medial malleolus from the boot he was wearing. He has a pressure ulcer on his right heel, as well. He was referred to the wound care center for further evaluation and management. 01/17/2023: All of the wounds are little bit smaller today with the exception of the one over his posterior Achilles on  the left. This wound is bigger and has a fibrous layer to it that is densely adherent. There is slough accumulation on all of the wounds. He says he has not been using the Prevalon boot because he frequently has to get up and urinate but he does report that he is floating his heels on  pillows. 01/27/2023: The surgical incision site is healed. His medial ankle wound is down to just a couple of millimeters. His lateral left heel wound is also quite a bit smaller. There is minimal slough accumulation on this wound. The Achilles area on the left is still about the same size with thick, fibrotic, adherent non-viable tissue. The wound on his right heel is smaller. There is still some depth at the center with slough accumulation. 01/31/2023: The medial ankle wound has some eschar on the surface, underneath which it is about a millimeter in each dimension. The left lateral heel wound is down to a couple of millimeters with just some slough on the surface. The right calcaneal wound is also smaller with good epithelialization coming in. The Achilles area wound also continues to contract and the adherent nonviable tissue is much softer today. 02/07/2023: The left medial ankle wound is closed. The right calcaneal wound is very superficial and clean without any slough accumulation. The left lateral heel wound is also quite a bit smaller and appears similar to the right heel wound. The posterior ankle wound over the Achilles is contracting and there is a band of skin that has almost divided this wound in two. The rubbery slough is still present but soft. Electronic Signature(s) Signed: 02/07/2023 11:56:46 AM By: Duanne Guess MD FACS Entered By: Duanne Guess on 02/07/2023 11:56:46 -------------------------------------------------------------------------------- Physical Exam Details Patient Name: Date of Service: Neil Rubenstein M. 02/07/2023 11:15 A M Medical Record Number: [id number] Patient Account  Number: [id number] Date of Birth/Sex: Treating RN: 1945-07-16 (77 y.o. [name] Primary Care Provider: Vilinda Boehringer, DHA NA [facility] Other Clinician: Referring Provider: Treating Provider/Extender: Estrella Myrtle SHI, DHA NA [facility] Weeks in Treatment: 4 Constitutional . . . . no acute distress. MARKEAL, DELCONTE ([id number]) [id number] Page 3 of 10 Respiratory Normal work of breathing on room air. Notes 02/07/2023: The left medial ankle wound is closed. The right calcaneal wound is very superficial and clean without any slough accumulation. The left lateral heel wound is also quite a bit smaller and appears similar to the right heel wound. The posterior ankle wound over the Achilles is contracting and there is a band of skin that has almost divided this wound in two. The rubbery slough is still present but soft. Electronic Signature(s) Signed: 02/07/2023 12:01:47 PM By: Duanne Guess MD FACS Entered By: Duanne Guess on 02/07/2023 12:01:47 -------------------------------------------------------------------------------- Physician Orders Details Patient Name: Date of Service: Neil Pearson M M. 02/07/2023 11:15 A M Medical Record Number: [id number] Patient Account Number: [id number] Date of Birth/Sex: Treating RN: 04-21-1946 (77 y.o. [name] Primary Care Provider: Vilinda Boehringer, DHA NA [facility] Other Clinician: Referring Provider: Treating Provider/Extender: Estrella Myrtle SHI, DHA NA [facility] Weeks in Treatment: 4 Verbal / Phone Orders: No Diagnosis Coding ICD-10 Coding Code Description L97.322 Non-pressure chronic ulcer of left ankle with fat layer exposed L89.623 Pressure ulcer of left heel, stage 3 L89.523 Pressure ulcer of left ankle, stage 3 L89.613 Pressure ulcer of right heel, stage 3 G62.9 Polyneuropathy, unspecified Follow-up Appointments ppointment in 2 weeks. - Dr. Lady Gary RM 1 Return A Tuesday 8/20 @ 10:30  am Anesthetic Wound #2 Left,Posterior Lower Leg (In clinic) Topical Lidocaine 4% applied to wound bed Wound #4 Left,Lateral Foot (In clinic) Topical Lidocaine 4% applied to wound bed Wound #6 Right Calcaneus (In clinic) Topical Lidocaine 4% applied to wound bed Bathing/ Shower/ Hygiene May shower and wash wound with soap and water. Off-Loading  DH Walker Boot to: - left foot per ortho Prevalon Boot - Prevalon boot to right foot especially while in bed Other: - Avoid pressure on the posterior (back ) of left lower leg Home Health New wound care orders this week; continue Home Health for wound care. May utilize formulary equivalent dressing for wound treatment orders unless otherwise specified. Dressing changes to be completed by Home Health on Monday / Wednesday / Friday except when patient has scheduled visit at Wound Care Center. - [facility] to change dressing 3 times next week Other Home Health Orders/Instructions: Verna Czech fax- [phone] Wound Treatment Wound #2 - Lower Leg Wound Laterality: Left, Posterior Prim Dressing: Hydrofera Blue Ready Transfer Foam, 2.5x2.5 (in/in) 3 x Per Week/30 Days ary Discharge Instructions: Apply directly to wound bed as directed Prim Dressing: Santyl Ointment 3 x Per Week/30 Days ary Discharge Instructions: Apply nickel thick amount to wound bed as instructed ANTAVION, ROTHROCK ([id number]) [id number] Page 4 of 10 Secondary Dressing: Woven Gauze Sponge, Non-Sterile 4x4 in 3 x Per Week/30 Days Discharge Instructions: Apply over primary dressing as directed. Secured With: Elastic Bandage 4 inch (ACE bandage) 3 x Per Week/30 Days Discharge Instructions: Secure with ACE bandage as directed. Secured With: [employer], 4.5x3.1 (in/yd) 3 x Per Week/30 Days Discharge Instructions: Secure with Kerlix as directed. Wound #4 - Foot Wound Laterality: Left, Lateral Prim Dressing: Maxorb Extra Ag+ Alginate  Dressing, 4x4.75 (in/in) 3 x Per Week/30 Days ary Discharge Instructions: Apply to wound bed as instructed Secondary Dressing: Woven Gauze Sponge, Non-Sterile 4x4 in 3 x Per Week/30 Days Discharge Instructions: Apply over primary dressing as directed. Secured With: Elastic Bandage 4 inch (ACE bandage) 3 x Per Week/30 Days Discharge Instructions: Secure with ACE bandage as directed. Secured With: [employer], 4.5x3.1 (in/yd) 3 x Per Week/30 Days Discharge Instructions: Secure with Kerlix as directed. Wound #6 - Calcaneus Wound Laterality: Right Prim Dressing: Maxorb Extra Ag+ Alginate Dressing, 4x4.75 (in/in) 3 x Per Week/30 Days ary Discharge Instructions: Apply to wound bed as instructed Secondary Dressing: Woven Gauze Sponge, Non-Sterile 4x4 in 3 x Per Week/30 Days Discharge Instructions: Apply over primary dressing as directed. Secured With: Elastic Bandage 4 inch (ACE bandage) 3 x Per Week/30 Days Discharge Instructions: Secure with ACE bandage as directed. Secured With: [employer], 4.5x3.1 (in/yd) 3 x Per Week/30 Days Discharge Instructions: Secure with Kerlix as directed. Electronic Signature(s) Signed: 02/07/2023 12:07:14 PM By: Duanne Guess MD FACS Entered By: Duanne Guess on 02/07/2023 12:01:59 -------------------------------------------------------------------------------- Problem List Details Patient Name: Date of Service: Neil Pearson M M. 02/07/2023 11:15 A M Medical Record Number: [id number] Patient Account Number: [id number] Date of Birth/Sex: Treating RN: [birth year] (77 y.o. M) Primary Care Provider: Vilinda Boehringer, DHA NA [facility] Other Clinician: Referring Provider: Treating Provider/Extender: Estrella Myrtle SHI, DHA NA [facility] Weeks in Treatment: 4 Active Problems ICD-10 Encounter Code Description Active Date MDM Diagnosis L97.322 Non-pressure chronic ulcer of left ankle with fat layer exposed 01/09/2023 No Yes L89.623 Pressure ulcer of left  heel, stage 3 01/09/2023 No Yes L89.523 Pressure ulcer of left ankle, stage 3 01/09/2023 No Yes [name], [name] ([id number]) [id number] Page 5 of 10 L89.613 Pressure ulcer of right heel, stage 3 01/09/2023 No Yes G62.9 Polyneuropathy, unspecified 01/09/2023 No Yes Inactive Problems Resolved Problems Electronic Signature(s) Signed: 02/07/2023 11:23:19 AM By: Duanne Guess MD FACS Entered By: Duanne Guess on 02/07/2023 11:23:18 -------------------------------------------------------------------------------- Progress Note Details Patient Name: Date of Service: Neil Blalock MSO Rockne Coons  Roque [name] 02/07/2023 11:15 A M Medical Record Number: [id number] Patient Account Number: [id number] Date of Birth/Sex: Treating RN: 10-01-1945 (77 y.o. [name] Primary Care Provider: Vilinda Boehringer, DHA NA [facility] Other Clinician: Referring Provider: Treating Provider/Extender: Estrella Myrtle SHI, DHA NA [facility] Weeks in Treatment: 4 Subjective Chief Complaint Information obtained from Patient Patient presents to the wound care center with open non-healing surgical wound(s) and pressure ulcers History of Present Illness (HPI) ADMISSION 01/09/2023 This is a 77 year old known diabetic who underwent a left ankle ORIF on Nov 26, 2022. He was seen by orthopedic surgery for follow-up on December 26, 2022 and was noted to have breakdown of his left lateral leg incision. He was also given a course of Keflex at that time. He also developed pressure ulcers on his posterior Achilles area, his lateral calcaneus, and his medial malleolus from the boot he was wearing. He has a pressure ulcer on his right heel, as well. He was referred to the wound care center for further evaluation and management. 01/17/2023: All of the wounds are little bit smaller today with the exception of the one over his posterior Achilles on the left. This wound is bigger and has a fibrous layer to it that is densely adherent.  There is slough accumulation on all of the wounds. He says he has not been using the Prevalon boot because he frequently has to get up and urinate but he does report that he is floating his heels on pillows. 01/27/2023: The surgical incision site is healed. His medial ankle wound is down to just a couple of millimeters. His lateral left heel wound is also quite a bit smaller. There is minimal slough accumulation on this wound. The Achilles area on the left is still about the same size with thick, fibrotic, adherent non-viable tissue. The wound on his right heel is smaller. There is still some depth at the center with slough accumulation. 01/31/2023: The medial ankle wound has some eschar on the surface, underneath which it is about a millimeter in each dimension. The left lateral heel wound is down to a couple of millimeters with just some slough on the surface. The right calcaneal wound is also smaller with good epithelialization coming in. The Achilles area wound also continues to contract and the adherent nonviable tissue is much softer today. 02/07/2023: The left medial ankle wound is closed. The right calcaneal wound is very superficial and clean without any slough accumulation. The left lateral heel wound is also quite a bit smaller and appears similar to the right heel wound. The posterior ankle wound over the Achilles is contracting and there is a band of skin that has almost divided this wound in two. The rubbery slough is still present but soft. Patient History Information obtained from Patient, Caregiver, Chart. Family History Heart Disease - Mother,Father, Hypertension - Mother,Father, Stroke - Father, No family history of Cancer, Diabetes, Hereditary Spherocytosis, Kidney Disease, Lung Disease, Seizures, Thyroid Problems, Tuberculosis. Social History Never smoker, Marital Status - Married, Alcohol Use - Rarely, Drug Use - No History, Caffeine Use - Daily - coffee. Medical  History Eyes Patient has history of Cataracts - bil extractions Denies history of Glaucoma, Optic Neuritis [name], [name] ([id number]) [id number] Page 6 of 10 Ear/Nose/Mouth/Throat Denies history of Chronic sinus problems/congestion, Middle ear problems Respiratory Patient has history of Sleep Apnea Cardiovascular Patient has history of Hypertension Endocrine Denies history of Type I Diabetes, Type II Diabetes Genitourinary Denies history of End Stage Renal Disease Immunological  Denies history of Lupus Erythematosus, Raynauds, Scleroderma Integumentary (Skin) Denies history of History of Burn Musculoskeletal Patient has history of Osteoarthritis Denies history of Gout, Rheumatoid Arthritis, Osteomyelitis Neurologic Denies history of Dementia, Neuropathy, Quadriplegia, Paraplegia, Seizure Disorder Oncologic Denies history of Received Chemotherapy, Received Radiation Psychiatric Denies history of Anorexia/bulimia, Confinement Anxiety Hospitalization/Surgery History - bil knee replacements. - ORIF left ankel 5/23 2024. - cholecystectomy. - cervical fusion. Medical A Surgical History Notes nd Respiratory hx aspiration PNA, asbestosis Gastrointestinal duodenitis, constipation Musculoskeletal ankylosing spondylitis, compression fx T10 Objective Constitutional no acute distress. Vitals Time Taken: 11:17 AM, Height: 72 in, Weight: 240 lbs, BMI: 32.5, Temperature: 98.6 F, Pulse: 64 bpm, Respiratory Rate: 18 breaths/min, Blood Pressure: 138/66 mmHg. Respiratory Normal work of breathing on room air. General Notes: 02/07/2023: The left medial ankle wound is closed. The right calcaneal wound is very superficial and clean without any slough accumulation. The left lateral heel wound is also quite a bit smaller and appears similar to the right heel wound. The posterior ankle wound over the Achilles is contracting and there is a band of skin that has  almost divided this wound in two. The rubbery slough is still present but soft. Integumentary (Hair, Skin) Wound #2 status is Open. Original cause of wound was Pressure Injury. The date acquired was: 12/03/2022. The wound has been in treatment 4 weeks. The wound is located on the Left,Posterior Lower Leg. The wound measures 3.6cm length x 1.8cm width x 0.1cm depth; 5.089cm^2 area and 0.509cm^3 volume. There is Fat Layer (Subcutaneous Tissue) exposed. There is no tunneling or undermining noted. There is a medium amount of serosanguineous drainage noted. The wound margin is distinct with the outline attached to the wound base. There is small (1-33%) red granulation within the wound bed. There is a large (67-100%) amount of necrotic tissue within the wound bed including Adherent Slough. The periwound skin appearance had no abnormalities noted for texture. The periwound skin appearance had no abnormalities noted for moisture. The periwound skin appearance had no abnormalities noted for color. Periwound temperature was noted as No Abnormality. Wound #3 status is Healed - Epithelialized. Original cause of wound was Pressure Injury. The date acquired was: 12/03/2022. The wound has been in treatment 4 weeks. The wound is located on the Left,Medial Ankle. The wound measures 0cm length x 0cm width x 0cm depth; 0cm^2 area and 0cm^3 volume. There is no tunneling or undermining noted. There is a none present amount of drainage noted. There is no granulation within the wound bed. There is no necrotic tissue within the wound bed. The periwound skin appearance had no abnormalities noted for texture. The periwound skin appearance had no abnormalities noted for moisture. The periwound skin appearance had no abnormalities noted for color. Periwound temperature was noted as No Abnormality. Wound #4 status is Open. Original cause of wound was Pressure Injury. The date acquired was: 12/03/2022. The wound has been in treatment 4  weeks. The wound is located on the Left,Lateral Foot. The wound measures 0.2cm length x 0.2cm width x 0.1cm depth; 0.031cm^2 area and 0.003cm^3 volume. There is Fat Layer (Subcutaneous Tissue) exposed. There is no tunneling or undermining noted. There is a none present amount of drainage noted. The wound margin is flat and intact. There is no granulation within the wound bed. There is no necrotic tissue within the wound bed. The periwound skin appearance had no abnormalities noted for texture. The periwound skin appearance had no abnormalities noted for moisture. The periwound skin appearance had  no abnormalities noted for color. Periwound temperature was noted as No Abnormality. The periwound has tenderness on palpation. Wound #6 status is Open. Original cause of wound was Pressure Injury. The date acquired was: 12/03/2022. The wound has been in treatment 4 weeks. The wound is located on the Right Calcaneus. The wound measures 0.3cm length x 0.6cm width x 0.1cm depth; 0.141cm^2 area and 0.014cm^3 volume. There is Fat Layer (Subcutaneous Tissue) exposed. There is no tunneling or undermining noted. There is a small amount of serosanguineous drainage noted. The wound margin is flat and intact. There is large (67-100%) red granulation within the wound bed. There is no necrotic tissue within the wound bed. The periwound skin appearance had no abnormalities noted for texture. The periwound skin appearance had no abnormalities noted for color. The periwound skin appearance exhibited: Dry/Scaly. Periwound temperature was noted as No Abnormality. [name], [name] ([id number]) [id number] Page 7 of 10 Assessment Active Problems ICD-10 Non-pressure chronic ulcer of left ankle with fat layer exposed Pressure ulcer of left heel, stage 3 Pressure ulcer of left ankle, stage 3 Pressure ulcer of right heel, stage 3 Polyneuropathy, unspecified Procedures Wound #2 Pre-procedure  diagnosis of Wound #2 is a Pressure Ulcer located on the Left,Posterior Lower Leg . There was a Selective/Open Wound Non-Viable Tissue Debridement with a total area of 5.09 sq cm performed by Duanne Guess, MD. With the following instrument(s): Curette to remove Non-Viable tissue/material. Material removed includes [facility] after achieving pain control using Lidocaine 4% T opical Solution. No specimens were taken. A time out was conducted at 11:45, prior to the start of the procedure. A Large amount of bleeding was controlled with Pressure. The procedure was tolerated well with a pain level of 0 throughout and a pain level of 0 following the procedure. Post Debridement Measurements: 3.6cm length x 1.8cm width x 0.1cm depth; 0.509cm^3 volume. Post debridement Stage noted as Category/Stage III. Character of Wound/Ulcer Post Debridement is improved. Post procedure Diagnosis Wound #2: Same as Pre-Procedure General Notes: Scribed for Dr. Lady Gary by Zenaida Deed, RN. Plan Follow-up Appointments: Return Appointment in 2 weeks. - Dr. Lady Gary RM 1 Tuesday 8/20 @ 10:30 am Anesthetic: Wound #2 Left,Posterior Lower Leg: (In clinic) Topical Lidocaine 4% applied to wound bed Wound #4 Left,Lateral Foot: (In clinic) Topical Lidocaine 4% applied to wound bed Wound #6 Right Calcaneus: (In clinic) Topical Lidocaine 4% applied to wound bed Bathing/ Shower/ Hygiene: May shower and wash wound with soap and water. Off-Loading: DH Walker Boot to: - left foot per ortho Prevalon Boot - Prevalon boot to right foot especially while in bed Other: - Avoid pressure on the posterior (back ) of left lower leg Home Health: New wound care orders this week; continue Home Health for wound care. May utilize formulary equivalent dressing for wound treatment orders unless otherwise specified. Dressing changes to be completed by Home Health on Monday / Wednesday / Friday except when patient has scheduled visit at Wound Care  Center. - [facility] to change dressing 3 times next week Other Home Health Orders/Instructions: Verna Czech fax- [phone] WOUND #2: - Lower Leg Wound Laterality: Left, Posterior Prim Dressing: Hydrofera Blue Ready Transfer Foam, 2.5x2.5 (in/in) 3 x Per Week/30 Days ary Discharge Instructions: Apply directly to wound bed as directed Prim Dressing: Santyl Ointment 3 x Per Week/30 Days ary Discharge Instructions: Apply nickel thick amount to wound bed as instructed Secondary Dressing: Woven Gauze Sponge, Non-Sterile 4x4 in 3 x Per Week/30 Days Discharge Instructions: Apply over primary  dressing as directed. Secured With: Elastic Bandage 4 inch (ACE bandage) 3 x Per Week/30 Days Discharge Instructions: Secure with ACE bandage as directed. Secured With: [employer], 4.5x3.1 (in/yd) 3 x Per Week/30 Days Discharge Instructions: Secure with Kerlix as directed. WOUND #4: - Foot Wound Laterality: Left, Lateral Prim Dressing: Maxorb Extra Ag+ Alginate Dressing, 4x4.75 (in/in) 3 x Per Week/30 Days ary Discharge Instructions: Apply to wound bed as instructed Secondary Dressing: Woven Gauze Sponge, Non-Sterile 4x4 in 3 x Per Week/30 Days Discharge Instructions: Apply over primary dressing as directed. Secured With: Elastic Bandage 4 inch (ACE bandage) 3 x Per Week/30 Days Discharge Instructions: Secure with ACE bandage as directed. Secured With: [employer], 4.5x3.1 (in/yd) 3 x Per Week/30 Days Discharge Instructions: Secure with Kerlix as directed. WOUND #6: - Calcaneus Wound Laterality: Right Prim Dressing: Maxorb Extra Ag+ Alginate Dressing, 4x4.75 (in/in) 3 x Per Week/30 Days ary Discharge Instructions: Apply to wound bed as instructed Secondary Dressing: Woven Gauze Sponge, Non-Sterile 4x4 in 3 x Per Week/30 Days Discharge Instructions: Apply over primary dressing as directed. Secured With: Elastic Bandage 4 inch (ACE bandage) 3 x Per Week/30 Days Discharge  Instructions: Secure with ACE bandage as directed. Secured With: [employer], 4.5x3.1 (in/yd) 3 x Per Week/30 Days Discharge Instructions: Secure with Kerlix as directed. [name], [name] ([id number]) [id number] Page 8 of 10 02/07/2023: The left medial ankle wound is closed. The right calcaneal wound is very superficial and clean without any slough accumulation. The left lateral heel wound is also quite a bit smaller and appears similar to the right heel wound. The posterior ankle wound over the Achilles is contracting and there is a band of skin that has almost divided this wound in two. The rubbery slough is still present but soft. The only wound that required debridement was the 1 over his left Achilles. I removed slough with a curette. The other 2 wounds are too small to really get collagen into anymore so we will just cover them with silver alginate. Will continue Santyl and Hydrofera Blue to his posterior Achilles site. We will continue Kerlix and Ace bandage wrapping. Follow-up in 2 weeks, due to clinic scheduling. Electronic Signature(s) Signed: 02/07/2023 12:03:18 PM By: Duanne Guess MD FACS Entered By: Duanne Guess on 02/07/2023 12:03:18 -------------------------------------------------------------------------------- HxROS Details Patient Name: Date of Service: Neil Rubenstein M. 02/07/2023 11:15 A M Medical Record Number: [id number] Patient Account Number: [id number] Date of Birth/Sex: Treating RN: 15-May-1946 (77 y.o. [name] Primary Care Provider: Vilinda Boehringer, DHA NA [facility] Other Clinician: Referring Provider: Treating Provider/Extender: Estrella Myrtle SHI, DHA NA [facility] Weeks in Treatment: 4 Information Obtained From Patient Caregiver Chart Eyes Medical History: Positive for: Cataracts - bil extractions Negative for: Glaucoma; Optic Neuritis Ear/Nose/Mouth/Throat Medical History: Negative for: Chronic sinus  problems/congestion; Middle ear problems Respiratory Medical History: Positive for: Sleep Apnea Past Medical History Notes: hx aspiration PNA, asbestosis Cardiovascular Medical History: Positive for: Hypertension Gastrointestinal Medical History: Past Medical History Notes: duodenitis, constipation Endocrine Medical History: Negative for: Type I Diabetes; Type II Diabetes Genitourinary Medical History: Negative for: End Stage Renal Disease [name], [name] ([id number]) [id number] Page 9 of 10 Immunological Medical History: Negative for: Lupus Erythematosus; Raynauds; Scleroderma Integumentary (Skin) Medical History: Negative for: History of Burn Musculoskeletal Medical History: Positive for: Osteoarthritis Negative for: Gout; Rheumatoid Arthritis; Osteomyelitis Past Medical History Notes: ankylosing spondylitis, compression fx T10 Neurologic Medical History: Negative for: Dementia; Neuropathy; Quadriplegia; Paraplegia; Seizure Disorder Oncologic Medical History:  Negative for: Received Chemotherapy; Received Radiation Psychiatric Medical History: Negative for: Anorexia/bulimia; Confinement Anxiety HBO Extended History Items Eyes: Cataracts Immunizations Pneumococcal Vaccine: Received Pneumococcal Vaccination: Yes Received Pneumococcal Vaccination On or After 60th Birthday: Yes Implantable Devices Yes Hospitalization / Surgery History Type of Hospitalization/Surgery bil knee replacements ORIF left ankel 5/23 2024 cholecystectomy cervical fusion Family and Social History Cancer: No; Diabetes: No; Heart Disease: Yes - Mother,Father; Hereditary Spherocytosis: No; Hypertension: Yes - Mother,Father; Kidney Disease: No; Lung Disease: No; Seizures: No; Stroke: Yes - Father; Thyroid Problems: No; Tuberculosis: No; Never smoker; Marital Status - Married; Alcohol Use: Rarely; Drug Use: No History; Caffeine Use: Daily - coffee; Financial  Concerns: No; Food, Clothing or Shelter Needs: No; Support System Lacking: No; Transportation Concerns: No Electronic Signature(s) Signed: 02/07/2023 12:07:14 PM By: Duanne Guess MD FACS Signed: 02/07/2023 3:51:43 PM By: Zenaida Deed RN, BSN Entered By: Duanne Guess on 02/07/2023 11:56:59 -------------------------------------------------------------------------------- SuperBill Details Patient Name: Date of Service: [name]. 02/07/2023 Medical Record Number: [id number] Patient Account Number: [id number] VIBHAV, CUBIAS ([id number]) [id number] Page 10 of 10 Date of Birth/Sex: Treating RN: 1946-03-12 (77 y.o. [name] Primary Care Provider: Vilinda Boehringer, DHA NA [facility] Other Clinician: Referring Provider: Treating Provider/Extender: Estrella Myrtle SHI, DHA NA [facility] Weeks in Treatment: 4 Diagnosis Coding ICD-10 Codes Code Description [phone] Non-pressure chronic ulcer of left ankle with fat layer exposed L89.623 Pressure ulcer of left heel, stage 3 L89.523 Pressure ulcer of left ankle, stage 3 L89.613 Pressure ulcer of right heel, stage 3 G62.9 Polyneuropathy, unspecified Facility Procedures : CPT4 Code: [id number] Description: 7597 - DEBRIDE WOUND 1ST 20 SQ CM OR < ICD-10 Diagnosis Description L89.523 Pressure ulcer of left ankle, stage 3 Modifier: Quantity: 1 Physician Procedures : CPT4 Code Description Modifier [id number] - WC PHYS LEVEL 4 - EST PT 25 ICD-10 Diagnosis Description L89.613 Pressure ulcer of right heel, stage 3 L89.623 Pressure ulcer of left heel, stage 3 L89.523 Pressure ulcer of left ankle, stage 3 Quantity: 1 : [id number] - WC PHYS DEBR WO ANESTH 20 SQ CM ICD-10 Diagnosis Description L89.523 Pressure ulcer of left ankle, stage 3 Quantity: 1 Electronic Signature(s) Signed: 02/07/2023 12:05:37 PM By: Duanne Guess MD FACS Entered By: Duanne Guess on 02/07/2023 12:05:37

## 2023-02-08 ENCOUNTER — Encounter (HOSPITAL_BASED_OUTPATIENT_CLINIC_OR_DEPARTMENT_OTHER): Payer: Medicare Other | Admitting: Physical Therapy

## 2023-02-13 ENCOUNTER — Ambulatory Visit (HOSPITAL_BASED_OUTPATIENT_CLINIC_OR_DEPARTMENT_OTHER): Payer: Medicare Other | Admitting: Physical Therapy

## 2023-02-14 ENCOUNTER — Ambulatory Visit (HOSPITAL_BASED_OUTPATIENT_CLINIC_OR_DEPARTMENT_OTHER): Payer: Medicare Other | Admitting: Internal Medicine

## 2023-02-15 ENCOUNTER — Encounter (HOSPITAL_BASED_OUTPATIENT_CLINIC_OR_DEPARTMENT_OTHER): Payer: Medicare Other | Admitting: Physical Therapy

## 2023-02-20 ENCOUNTER — Ambulatory Visit (HOSPITAL_BASED_OUTPATIENT_CLINIC_OR_DEPARTMENT_OTHER): Payer: Medicare Other | Admitting: Physical Therapy

## 2023-02-21 ENCOUNTER — Encounter (HOSPITAL_BASED_OUTPATIENT_CLINIC_OR_DEPARTMENT_OTHER): Payer: Medicare Other | Admitting: General Surgery

## 2023-02-21 DIAGNOSIS — L97322 Non-pressure chronic ulcer of left ankle with fat layer exposed: Secondary | ICD-10-CM | POA: Diagnosis not present

## 2023-02-22 NOTE — Progress Notes (Signed)
QUADRY, MENOR ([id number]) [id number] Page 1 of 10 Visit Report for 02/21/2023 Arrival Information Details Patient Name: Date of Service: [facility] [name]. 02/21/2023 10:30 A M Medical Record Number: [id number] Patient Account Number: [id number] Date of Birth/Sex: Treating RN: 1946/01/20 (77 y.o. [name] Primary Care Aleysia Oltmann: Vilinda Boehringer, DHA NA [facility] Other Clinician: Referring Rayshawn Maney: Treating Tristyn Pharris/Extender: Estrella Myrtle SHI, DHA NA [facility] Weeks in Treatment: 6 Visit Information History Since Last Visit All ordered tests and consults were completed: Yes Patient Arrived: Wheel Chair Added or deleted any medications: No Arrival Time: 10:28 Any new allergies or adverse reactions: No Accompanied By: spouse Had a fall or experienced change in No Transfer Assistance: None activities of daily living that may affect Patient Identification Verified: Yes risk of falls: Secondary Verification Process Completed: Yes Signs or symptoms of abuse/neglect since last visito No Patient Requires Transmission-Based Precautions: No Hospitalized since last visit: No Patient Has Alerts: No Implantable device outside of the clinic excluding No cellular tissue based products placed in the center since last visit: Has Dressing in Place as Prescribed: Yes Pain Present Now: No Electronic Signature(s) Signed: 02/22/2023 4:36:46 PM By: Zenaida Deed RN, BSN Entered By: Zenaida Deed on 02/21/2023 10:33:03 -------------------------------------------------------------------------------- Encounter Discharge Information Details Patient Name: Date of Service: Neil Pearson M M. 02/21/2023 10:30 A M Medical Record Number: [id number] Patient Account Number: [id number] Date of Birth/Sex: Treating RN: 08/31/1945 (77 y.o. [name] Primary Care Christos Mixson: Vilinda Boehringer, DHA NA [facility] Other Clinician: Referring Kemper Hochman: Treating  Reyce Lubeck/Extender: Estrella Myrtle SHI, DHA NA [facility] Weeks in Treatment: 6 Encounter Discharge Information Items Post Procedure Vitals Discharge Condition: Stable Temperature (F): 97.6 Ambulatory Status: Wheelchair Pulse (bpm): 60 Discharge Destination: Home Respiratory Rate (breaths/min): 18 Transportation: Private Auto Blood Pressure (mmHg): 136/64 Accompanied By: spouse Schedule Follow-up Appointment: Yes Clinical Summary of Care: Patient Declined Electronic Signature(s) Signed: 02/22/2023 4:36:46 PM By: Zenaida Deed RN, BSN Entered By: Zenaida Deed on 02/21/2023 11:25:01 [name] ([id number]) [id number] Page 2 of 10 -------------------------------------------------------------------------------- Lower Extremity Assessment Details Patient Name: Date of Service: [facility] [name]. 02/21/2023 10:30 A M Medical Record Number: [id number] Patient Account Number: [id number] Date of Birth/Sex: Treating RN: [birth year] (77 y.o. [name] Primary Care Tristian Sickinger: Vilinda Boehringer, DHA NA [facility] Other Clinician: Referring Isom Kochan: Treating Tessica Cupo/Extender: Estrella Myrtle SHI, DHA NA [facility] Weeks in Treatment: 6 Edema Assessment Assessed: [Left: No] [Right: No] Edema: [Left: Yes] [Right: Yes] Calf Left: Right: Point of Measurement: From Medial Instep 38.5 cm 39.5 cm Ankle Left: Right: Point of Measurement: From Medial Instep 28.1 cm 27 cm Vascular Assessment Pulses: Dorsalis Pedis Palpable: [Left:Yes] [Right:Yes] Extremity colors, hair growth, and conditions: Extremity Color: [Left:Normal] [Right:Normal] Hair Growth on Extremity: [Left:Yes] [Right:Yes] Temperature of Extremity: [Left:Warm] [Right:Warm] Capillary Refill: [Left:< 3 seconds] [Right:< 3 seconds] Dependent Rubor: [Left:No No] [Right:No No] Electronic Signature(s) Signed: 02/22/2023 4:36:46 PM By: Zenaida Deed RN, BSN Entered By: Zenaida Deed on  02/21/2023 10:42:08 -------------------------------------------------------------------------------- Multi Wound Chart Details Patient Name: Date of Service: Neil Pearson M M. 02/21/2023 10:30 A M Medical Record Number: [id number] Patient Account Number: [id number] Date of Birth/Sex: Treating RN: 04/05/1946 (77 y.o. M) Primary Care Mertice Uffelman: Vilinda Boehringer, DHA NA [facility] Other Clinician: Referring Jaquari Reckner: Treating Dyamond Tolosa/Extender: Estrella Myrtle SHI, DHA NA [facility] Weeks in Treatment: 6 Vital Signs Height(in): 72 Pulse(bpm): 60 Weight(lbs): 240 Blood Pressure(mmHg): 136/64 Body Mass Index(BMI): 32.5 Temperature(F): 97.6 Respiratory Rate(breaths/min): 18 [2:Photos:] [[id number] Page  3 of 10] Left, Posterior Lower Leg Left, Lateral Foot Right Calcaneus Wound Location: Pressure Injury Pressure Injury Pressure Injury Wounding Event: Pressure Ulcer Pressure Ulcer Pressure Ulcer Primary Etiology: Cataracts, Sleep Apnea, Hypertension, Cataracts, Sleep Apnea, Hypertension, Cataracts, Sleep Apnea, Hypertension, Comorbid History: Osteoarthritis Osteoarthritis Osteoarthritis 12/03/2022 12/03/2022 12/03/2022 Date Acquired: 6 6 6  Weeks of Treatment: Open Healed - Epithelialized Healed - Epithelialized Wound Status: No No No Wound Recurrence: 2.5x1.2x0.1 0x0x0 0x0x0 Measurements L x W x D (cm) 2.356 0 0 A (cm) : rea 0.236 0 0 Volume (cm) : -100.00% 100.00% 100.00% % Reduction in A rea: -100.00% 100.00% 100.00% % Reduction in Volume: Category/Stage III Category/Stage III Category/Stage III Classification: Medium None Present None Present Exudate A mount: Serosanguineous N/A N/A Exudate Type: red, brown N/A N/A Exudate Color: Distinct, outline attached N/A N/A Wound Margin: Small (1-33%) None Present (0%) None Present (0%) Granulation A mount: Red N/A N/A Granulation Quality: Large (67-100%) None Present (0%) None Present (0%) Necrotic A  mount: Fat Layer (Subcutaneous Tissue): Yes Fascia: No Fascia: No Exposed Structures: Fascia: No Fat Layer (Subcutaneous Tissue): No Fat Layer (Subcutaneous Tissue): No Tendon: No Tendon: No Tendon: No Muscle: No Muscle: No Muscle: No Joint: No Joint: No Joint: No Bone: No Bone: No Bone: No Small (1-33%) Large (67-100%) Large (67-100%) Epithelialization: Debridement - Selective/Open Wound N/A N/A Debridement: Pre-procedure Verification/Time Out 10:55 N/A N/A Taken: Lidocaine 4% Topical Solution N/A N/A Pain Control: Slough N/A N/A Tissue Debrided: Non-Viable Tissue N/A N/A Level: 2.36 N/A N/A Debridement A (sq cm): rea Curette N/A N/A Instrument: Minimum N/A N/A Bleeding: Pressure N/A N/A Hemostasis A chieved: 0 N/A N/A Procedural Pain: 0 N/A N/A Post Procedural Pain: Procedure was tolerated well N/A N/A Debridement Treatment Response: 2.5x1.2x0.1 N/A N/A Post Debridement Measurements L x W x D (cm) 0.236 N/A N/A Post Debridement Volume: (cm) Category/Stage III N/A N/A Post Debridement Stage: No Abnormalities Noted No Abnormalities Noted Callus: Yes Periwound Skin Texture: No Abnormalities Noted No Abnormalities Noted Dry/Scaly: Yes Periwound Skin Moisture: No Abnormalities Noted No Abnormalities Noted No Abnormalities Noted Periwound Skin Color: No Abnormality No Abnormality No Abnormality Temperature: N/A Yes N/A Tenderness on Palpation: Debridement N/A N/A Procedures Performed: Treatment Notes Electronic Signature(s) Signed: 02/21/2023 11:08:45 AM By: Duanne Guess MD FACS Entered By: Duanne Guess on 02/21/2023 11:08:45 -------------------------------------------------------------------------------- Multi-Disciplinary Care Plan Details Patient Name: Date of Service: Neil Blalock MSO Rockne Coons M M. 02/21/2023 10:30 A M Medical Record Number: [id number] Patient Account Number: [id number] Date of Birth/Sex: Treating RN: May 09, 1946 (77 y.o. [name] Primary Care Evalene Vath: [facility], DHA NA [facility] Other Clinician: DANNYE, KENNY ([id number]) [id number] Page 4 of 10 Referring Harnoor Kohles: Treating Eloyse Causey/Extender: Estrella Myrtle SHI, DHA NA [facility] Weeks in Treatment: 6 Multidisciplinary Care Plan reviewed with physician Active Inactive Abuse / Safety / Falls / Self Care Management Nursing Diagnoses: Potential for falls Goals: Patient/caregiver will verbalize/demonstrate measures taken to prevent injury and/or falls Date Initiated: 01/09/2023 Target Resolution Date: 03/06/2023 Goal Status: Active Interventions: Assess fall risk on admission and as needed Assess: immobility, friction, shearing, incontinence upon admission and as needed Assess impairment of mobility on admission and as needed per policy Notes: Pressure Nursing Diagnoses: Knowledge deficit related to causes and risk factors for pressure ulcer development Knowledge deficit related to management of pressures ulcers Potential for impaired tissue integrity related to pressure, friction, moisture, and shear Goals: Patient/caregiver will verbalize understanding of pressure ulcer management Date Initiated: 01/09/2023 Target Resolution Date: 03/06/2023 Goal Status: Active Interventions:  Assess: immobility, friction, shearing, incontinence upon admission and as needed Assess offloading mechanisms upon admission and as needed Assess potential for pressure ulcer upon admission and as needed Notes: Wound/Skin Impairment Nursing Diagnoses: Impaired tissue integrity Knowledge deficit related to ulceration/compromised skin integrity Goals: Patient/caregiver will verbalize understanding of skin care regimen Date Initiated: 01/09/2023 Target Resolution Date: 03/06/2023 Goal Status: Active Ulcer/skin breakdown will have a volume reduction of 30% by week 4 Date Initiated: 01/09/2023 Date Inactivated: 02/07/2023 Target Resolution Date:  02/06/2023 Goal Status: Met Ulcer/skin breakdown will have a volume reduction of 50% by week 8 Date Initiated: 02/07/2023 Target Resolution Date: 03/06/2023 Goal Status: Active Interventions: Assess patient/caregiver ability to obtain necessary supplies Assess patient/caregiver ability to perform ulcer/skin care regimen upon admission and as needed Assess ulceration(s) every visit Provide education on ulcer and skin care Treatment Activities: Skin care regimen initiated : 01/09/2023 Topical wound management initiated : 01/09/2023 Notes: Electronic Signature(s) Signed: 02/22/2023 4:36:46 PM By: Zenaida Deed RN, BSN [name] ([id number]) [id number] Page 5 of 10 Entered By: Zenaida Deed on 02/21/2023 10:53:40 -------------------------------------------------------------------------------- Pain Assessment Details Patient Name: Date of Service: [facility] [name]. 02/21/2023 10:30 A M Medical Record Number: [id number] Patient Account Number: [id number] Date of Birth/Sex: Treating RN: [birth year] (77 y.o. [name] Primary Care Doretha Goding: Vilinda Boehringer, DHA NA [facility] Other Clinician: Referring Littleton Haub: Treating Jurney Overacker/Extender: Estrella Myrtle SHI, DHA NA [facility] Weeks in Treatment: 6 Active Problems Location of Pain Severity and Description of Pain Patient Has Paino No Site Locations Rate the pain. Current Pain Level: 0 Pain Management and Medication Current Pain Management: Electronic Signature(s) Signed: 02/22/2023 4:36:46 PM By: Zenaida Deed RN, BSN Entered By: Zenaida Deed on 02/21/2023 10:34:04 -------------------------------------------------------------------------------- Patient/Caregiver Education Details Patient Name: Date of Service: [name] 8/20/2024andnbsp10:30 A M Medical Record Number: [id number] Patient Account Number: [id number] Date of Birth/Gender: Treating RN: 02-01-1946 (77 y.o. [name] Primary Care Physician: Vilinda Boehringer, [facility] NA [facility] Other Clinician: Referring Physician: Treating Physician/Extender: Estrella Myrtle SHI, DHA NA [facility] Weeks in Treatment: 6 Education Assessment Education Provided To: Patient Education Topics Provided Pressure: [name], [name] ([id number]) [id number] Page 6 of 10 Methods: Explain/Verbal Responses: Reinforcements needed, State content correctly Wound/Skin Impairment: Methods: Explain/Verbal Responses: Reinforcements needed, State content correctly Electronic Signature(s) Signed: 02/22/2023 4:36:46 PM By: Zenaida Deed RN, BSN Entered By: Zenaida Deed on 02/21/2023 10:54:08 -------------------------------------------------------------------------------- Wound Assessment Details Patient Name: Date of Service: Neil Pearson M M. 02/21/2023 10:30 A M Medical Record Number: [id number] Patient Account Number: [id number] Date of Birth/Sex: Treating RN: [birth year] (77 y.o. [name] Primary Care Santina Trillo: Vilinda Boehringer, DHA NA [facility] Other Clinician: Referring Jeryn Bertoni: Treating Jessy Cybulski/Extender: Estrella Myrtle SHI, DHA NA [facility] Weeks in Treatment: 6 Wound Status Wound Number: 2 Primary Etiology: Pressure Ulcer Wound Location: Left, Posterior Lower Leg Wound Status: Open Wounding Event: Pressure Injury Comorbid History: Cataracts, Sleep Apnea, Hypertension, Osteoarthritis Date Acquired: 12/03/2022 Weeks Of Treatment: 6 Clustered Wound: No Photos Wound Measurements Length: (cm) 2.5 Width: (cm) 1.2 Depth: (cm) 0.1 Area: (cm) 2.356 Volume: (cm) 0.236 % Reduction in Area: -100% % Reduction in Volume: -100% Epithelialization: Small (1-33%) Tunneling: No Undermining: No Wound Description Classification: Category/Stage III Wound Margin: Distinct, outline attached Exudate Amount: Medium Exudate Type: Serosanguineous Exudate Color: red, brown Foul Odor After Cleansing:  No Slough/Fibrino Yes Wound Bed Granulation Amount: Small (1-33%) Exposed Structure Granulation Quality: Red Fascia Exposed: No Necrotic Amount: Large (67-100%) Fat Layer (Subcutaneous Tissue)  Exposed: Yes Necrotic Quality: Adherent Slough Tendon Exposed: No Muscle Exposed: No Joint Exposed: No Bone Exposed: No [name], [name] ([id number]) [id number] Page 7 of 10 Periwound Skin Texture Texture Color No Abnormalities Noted: Yes No Abnormalities Noted: Yes Moisture Temperature / Pain No Abnormalities Noted: Yes Temperature: No Abnormality Treatment Notes Wound #2 (Lower Leg) Wound Laterality: Left, Posterior Cleanser Peri-Wound Care Topical Primary Dressing Hydrofera Blue Ready Transfer Foam, 2.5x2.5 (in/in) Discharge Instruction: Apply directly to wound bed as directed Santyl Ointment Discharge Instruction: Apply nickel thick amount to wound bed as instructed Secondary Dressing Woven Gauze Sponge, Non-Sterile 4x4 in Discharge Instruction: Apply over primary dressing as directed. Secured With Elastic Bandage 4 inch (ACE bandage) Discharge Instruction: Secure with ACE bandage as directed. Kerlix Roll Sterile, 4.5x3.1 (in/yd) Discharge Instruction: Secure with Kerlix as directed. Compression Wrap Compression Stockings Add-Ons Electronic Signature(s) Signed: 02/22/2023 4:36:46 PM By: Zenaida Deed RN, BSN Entered By: Zenaida Deed on 02/21/2023 10:49:25 -------------------------------------------------------------------------------- Wound Assessment Details Patient Name: Date of Service: Neil Pearson M M. 02/21/2023 10:30 A M Medical Record Number: [id number] Patient Account Number: [id number] Date of Birth/Sex: Treating RN: Dec 05, 1945 (77 y.o. [name] Primary Care Jet Armbrust: Vilinda Boehringer, DHA NA [facility] Other Clinician: Referring Elija Mccamish: Treating Ovid Witman/Extender: Estrella Myrtle SHI, DHA NA [facility] Weeks in Treatment:  6 Wound Status Wound Number: 4 Primary Etiology: Pressure Ulcer Wound Location: Left, Lateral Foot Wound Status: Healed - Epithelialized Wounding Event: Pressure Injury Comorbid History: Cataracts, Sleep Apnea, Hypertension, Osteoarthritis Date Acquired: 12/03/2022 Weeks Of Treatment: 6 Clustered Wound: No Photos [name], [name] ([id number]) [id number] Page 8 of 10 Wound Measurements Length: (cm) Width: (cm) Depth: (cm) Area: (cm) Volume: (cm) 0 % Reduction in Area: 100% 0 % Reduction in Volume: 100% 0 Epithelialization: Large (67-100%) 0 Tunneling: No 0 Undermining: No Wound Description Classification: Category/Stage III Exudate Amount: None Present Foul Odor After Cleansing: No Slough/Fibrino No Wound Bed Granulation Amount: None Present (0%) Exposed Structure Necrotic Amount: None Present (0%) Fascia Exposed: No Fat Layer (Subcutaneous Tissue) Exposed: No Tendon Exposed: No Muscle Exposed: No Joint Exposed: No Bone Exposed: No Periwound Skin Texture Texture Color No Abnormalities Noted: Yes No Abnormalities Noted: Yes Moisture Temperature / Pain No Abnormalities Noted: Yes Temperature: No Abnormality Tenderness on Palpation: Yes Electronic Signature(s) Signed: 02/22/2023 4:36:46 PM By: Zenaida Deed RN, BSN Entered By: Zenaida Deed on 02/21/2023 10:50:47 -------------------------------------------------------------------------------- Wound Assessment Details Patient Name: Date of Service: Neil Pearson M M. 02/21/2023 10:30 A M Medical Record Number: [id number] Patient Account Number: [id number] Date of Birth/Sex: Treating RN: [birth year] (77 y.o. [name] Primary Care Wesleigh Markovic: Vilinda Boehringer, DHA NA [facility] Other Clinician: Referring Ellar Hakala: Treating Laraine Samet/Extender: Estrella Myrtle SHI, DHA NA [facility] Weeks in Treatment: 6 Wound Status Wound Number: 6 Primary Etiology: Pressure Ulcer Wound Location: Right  Calcaneus Wound Status: Healed - Epithelialized Wounding Event: Pressure Injury Comorbid History: Cataracts, Sleep Apnea, Hypertension, Osteoarthritis Date Acquired: 12/03/2022 Weeks Of Treatment: 6 Clustered Wound: No Photos [name], [name] ([id number]) [id number] Page 9 of 10 Wound Measurements Length: (cm) Width: (cm) Depth: (cm) Area: (cm) Volume: (cm) 0 % Reduction in Area: 100% 0 % Reduction in Volume: 100% 0 Epithelialization: Large (67-100%) 0 Tunneling: No 0 Undermining: No Wound Description Classification: Category/Stage III Exudate Amount: None Present Foul Odor After Cleansing: No Slough/Fibrino No Wound Bed Granulation Amount: None Present (0%) Exposed Structure Necrotic Amount: None Present (0%) Fascia Exposed: No Fat Layer (Subcutaneous Tissue) Exposed: No Tendon Exposed: No Muscle Exposed: No  Joint Exposed: No Bone Exposed: No Periwound Skin Texture Texture Color No Abnormalities Noted: Yes No Abnormalities Noted: Yes Moisture Temperature / Pain No Abnormalities Noted: No Temperature: No Abnormality Dry / Scaly: Yes Electronic Signature(s) Signed: 02/22/2023 4:36:46 PM By: Zenaida Deed RN, BSN Entered By: Zenaida Deed on 02/21/2023 10:51:29 -------------------------------------------------------------------------------- Vitals Details Patient Name: Date of Service: Neil Pearson M M. 02/21/2023 10:30 A M Medical Record Number: [id number] Patient Account Number: [id number] Date of Birth/Sex: Treating RN: 05/27/1946 (77 y.o. [name] Primary Care Jarae Panas: Vilinda Boehringer, DHA NA [facility] Other Clinician: Referring [facility]: Treating Clovia Reine/Extender: Estrella Myrtle SHI, DHA NA [facility] Weeks in Treatment: 6 Vital Signs Time Taken: 10:33 Temperature (F): 97.6 Height (in): 72 Pulse (bpm): 60 Weight (lbs): 240 Respiratory Rate (breaths/min): 18 Body Mass Index (BMI): 32.5 Blood Pressure (mmHg):  136/64 Reference Range: 80 - 120 mg / dl Electronic Signature(s) Signed: 02/22/2023 4:36:46 PM By: Zenaida Deed RN, BSN [name] ([id number]) [id number] Page 10 of 10 Entered By: Zenaida Deed on 02/21/2023 10:33:57

## 2023-02-22 NOTE — Progress Notes (Signed)
[name] ([id number]) [id number] Page 1 of 9 Visit Report for 02/21/2023 Chief Complaint Document Details Patient Name: Date of Service: [facility] [name]. 02/21/2023 10:30 A M Medical Record Number: [id number] Patient Account Number: [id number] Date of Birth/Sex: Treating RN: [birth year] (77 y.o. M) Primary Care Provider: Vilinda Boehringer, DHA NA [facility] Other Clinician: Referring Provider: Treating Provider/Extender: Estrella Myrtle SHI, DHA NA [facility] Weeks in Treatment: 6 Information Obtained from: Patient Chief Complaint Patient presents to the wound care center with open non-healing surgical wound(s) and pressure ulcers Electronic Signature(s) Signed: 02/21/2023 11:09:08 AM By: Duanne Guess MD FACS Entered By: Duanne Guess on 02/21/2023 08:09:08 -------------------------------------------------------------------------------- Debridement Details Patient Name: Date of Service: [name] M M. 02/21/2023 10:30 A M Medical Record Number: [id number] Patient Account Number: [id number] Date of Birth/Sex: Treating RN: [birth year] (77 y.o. [name] Primary Care Provider: Vilinda Boehringer, DHA NA [facility] Other Clinician: Referring Provider: Treating Provider/Extender: Estrella Myrtle SHI, DHA NA [facility] Weeks in Treatment: 6 Debridement Performed for Assessment: Wound #2 Left,Posterior Lower Leg Performed By: Physician Duanne Guess, MD Debridement Type: Debridement Level of Consciousness (Pre-procedure): Awake and Alert Pre-procedure Verification/Time Out Yes - 10:55 Taken: Start Time: 10:56 Pain Control: Lidocaine 4% T opical Solution Percent of Wound Bed Debrided: 100% T Area Debrided (cm): otal 2.36 Tissue and other material debrided: Non-Viable, Slough, Slough Level: Non-Viable Tissue Debridement Description: Selective/Open Wound Instrument: Curette Bleeding: Minimum Hemostasis Achieved: Pressure Procedural Pain:  0 Post Procedural Pain: 0 Response to Treatment: Procedure was tolerated well Level of Consciousness (Post- Awake and Alert procedure): Post Debridement Measurements of Total Wound Length: (cm) 2.5 Stage: Category/Stage III Width: (cm) 1.2 Depth: (cm) 0.1 Volume: (cm) 0.236 Character of Wound/Ulcer Post Debridement: Improved Post Procedure Diagnosis [name] ([id number]) [id number] Page 2 of 9 Same as Pre-procedure Notes Scribed for Dr. Lady Gary by Zenaida Deed, RN Electronic Signature(s) Signed: 02/21/2023 12:39:46 PM By: Duanne Guess MD FACS Signed: 02/22/2023 4:36:46 PM By: Zenaida Deed RN, BSN Entered By: Zenaida Deed on 02/21/2023 08:02:13 -------------------------------------------------------------------------------- HPI Details Patient Name: Date of Service: [name] M M. 02/21/2023 10:30 A M Medical Record Number: [id number] Patient Account Number: [id number] Date of Birth/Sex: Treating RN: 12-22-1945 (77 y.o. M) Primary Care Provider: Vilinda Boehringer, DHA NA [facility] Other Clinician: Referring Provider: Treating Provider/Extender: Estrella Myrtle SHI, DHA NA [facility] Weeks in Treatment: 6 History of Present Illness HPI Description: ADMISSION 01/09/2023 This is a 77 year old known diabetic who underwent a left ankle ORIF on Nov 26, 2022. He was seen by orthopedic surgery for follow-up on December 26, 2022 and was noted to have breakdown of his left lateral leg incision. He was also given a course of Keflex at that time. He also developed pressure ulcers on his posterior Achilles area, his lateral calcaneus, and his medial malleolus from the boot he was wearing. He has a pressure ulcer on his right heel, as well. He was referred to the wound care center for further evaluation and management. 01/17/2023: All of the wounds are little bit smaller today with the exception of the one over his posterior Achilles on the left. This wound  is bigger and has a fibrous layer to it that is densely adherent. There is slough accumulation on all of the wounds. He says he has not been using the Prevalon boot because he frequently has to get up and urinate but he does report that he is floating his heels on pillows. 01/27/2023: The surgical  incision site is healed. His medial ankle wound is down to just a couple of millimeters. His lateral left heel wound is also quite a bit smaller. There is minimal slough accumulation on this wound. The Achilles area on the left is still about the same size with thick, fibrotic, adherent non-viable tissue. The wound on his right heel is smaller. There is still some depth at the center with slough accumulation. 01/31/2023: The medial ankle wound has some eschar on the surface, underneath which it is about a millimeter in each dimension. The left lateral heel wound is down to a couple of millimeters with just some slough on the surface. The right calcaneal wound is also smaller with good epithelialization coming in. The Achilles area wound also continues to contract and the adherent nonviable tissue is much softer today. 02/07/2023: The left medial ankle wound is closed. The right calcaneal wound is very superficial and clean without any slough accumulation. The left lateral heel wound is also quite a bit smaller and appears similar to the right heel wound. The posterior ankle wound over the Achilles is contracting and there is a band of skin that has almost divided this wound in two. The rubbery slough is still present but soft. 02/21/2023: The heel wounds have both closed. The wound on his posterior ankle at the Achilles is smaller and the band of skin has divided it into 2 separate areas. There is slough accumulation on the wound surface. No concern for infection. Electronic Signature(s) Signed: 02/21/2023 11:10:08 AM By: Duanne Guess MD FACS Entered By: Duanne Guess on 02/21/2023  08:10:07 -------------------------------------------------------------------------------- Physical Exam Details Patient Name: Date of Service: [name]. 02/21/2023 10:30 A M Medical Record Number: [id number] Patient Account Number: [id number] Date of Birth/Sex: Treating RN: [birth year] (77 y.o. M) Primary Care Provider: Vilinda Boehringer, DHA NA [facility] Other Clinician: Referring Provider: Treating Provider/Extender: Estrella Myrtle SHI, DHA NA [facility] Weeks in Treatment: 6 SOM, HUDA ([id number]) [id number] Page 3 of 9 Constitutional . . . . no acute distress. Respiratory Normal work of breathing on room air. Notes 02/21/2023: The heel wounds have both closed. The wound on his posterior ankle at the Achilles is smaller and the band of skin has divided it into 2 separate areas. There is slough accumulation on the wound surface. No concern for infection. Electronic Signature(s) Signed: 02/21/2023 11:10:58 AM By: Duanne Guess MD FACS Entered By: Duanne Guess on 02/21/2023 08:10:58 -------------------------------------------------------------------------------- Physician Orders Details Patient Name: Date of Service: [name] M M. 02/21/2023 10:30 A M Medical Record Number: [id number] Patient Account Number: [id number] Date of Birth/Sex: Treating RN: 05/24/1946 (77 y.o. [name] Primary Care Provider: Vilinda Boehringer, DHA NA [facility] Other Clinician: Referring Provider: Treating Provider/Extender: Estrella Myrtle SHI, DHA NA [facility] Weeks in Treatment: 6 Verbal / Phone Orders: No Diagnosis Coding ICD-10 Coding Code Description [phone] Pressure ulcer of left ankle, stage 3 G62.9 Polyneuropathy, unspecified Follow-up Appointments ppointment in 1 week. - Dr. Lady Gary RM 1 Return A Wed 8/28 @ 11:15 am Anesthetic Wound #2 Left,Posterior Lower Leg (In clinic) Topical Lidocaine 4% applied to wound bed Bathing/ Shower/  Hygiene May shower and wash wound with soap and water. Off-Loading DH Walker Boot to: - left foot per ortho Prevalon Boot - Prevalon boot to right foot especially while in bed Other: - Avoid pressure on the posterior (back ) of left lower leg Home Health No change in wound care orders this week; continue Home Health  for wound care. May utilize formulary equivalent dressing for wound treatment orders unless otherwise specified. Dressing changes to be completed by Home Health on Monday / Wednesday / Friday except when patient has scheduled visit at Wound Care Center. - [facility] to change dressing 3 times next week Other Home Health Orders/Instructions: Verna Czech fax- [phone] Wound Treatment Wound #2 - Lower Leg Wound Laterality: Left, Posterior Prim Dressing: Hydrofera Blue Ready Transfer Foam, 2.5x2.5 (in/in) 3 x Per Week/30 Days ary Discharge Instructions: Apply directly to wound bed as directed Prim Dressing: Santyl Ointment 3 x Per Week/30 Days ary Discharge Instructions: Apply nickel thick amount to wound bed as instructed Secondary Dressing: Woven Gauze Sponge, Non-Sterile 4x4 in 3 x Per Week/30 Days Discharge Instructions: Apply over primary dressing as directed. Secured With: Elastic Bandage 4 inch (ACE bandage) 3 x Per Week/30 Days Discharge Instructions: Secure with ACE bandage as directed. JOHNATHON, DOBBYN ([id number]) [id number] Page 4 of 9 Secured With: [employer], 4.5x3.1 (in/yd) 3 x Per Week/30 Days Discharge Instructions: Secure with Kerlix as directed. Electronic Signature(s) Signed: 02/21/2023 12:39:46 PM By: Duanne Guess MD FACS Entered By: Duanne Guess on 02/21/2023 08:14:34 -------------------------------------------------------------------------------- Problem List Details Patient Name: Date of Service: [name] 02/21/2023 10:30 A M Medical Record Number: [id number] Patient Account  Number: [id number] Date of Birth/Sex: Treating RN: 12-18-1945 (77 y.o. [name] Primary Care Provider: Vilinda Boehringer, DHA NA [facility] Other Clinician: Referring Provider: Treating Provider/Extender: Estrella Myrtle SHI, DHA NA [facility] Weeks in Treatment: 6 Active Problems ICD-10 Encounter Code Description Active Date MDM Diagnosis L89.523 Pressure ulcer of left ankle, stage 3 01/09/2023 No Yes G62.9 Polyneuropathy, unspecified 01/09/2023 No Yes Inactive Problems Resolved Problems ICD-10 Code Description Active Date Resolved Date L89.623 Pressure ulcer of left heel, stage 3 01/09/2023 01/09/2023 L97.322 Non-pressure chronic ulcer of left ankle with fat layer exposed 01/09/2023 01/09/2023 L89.613 Pressure ulcer of right heel, stage 3 01/09/2023 01/09/2023 Electronic Signature(s) Signed: 02/21/2023 11:05:53 AM By: Duanne Guess MD FACS Entered By: Duanne Guess on 02/21/2023 08:05:53 -------------------------------------------------------------------------------- Progress Note Details Patient Name: Date of Service: [name] 02/21/2023 10:30 A M Medical Record Number: [id number] Patient Account Number: [id number] ARBY, HATT ([id number]) [id number] Page 5 of 9 Date of Birth/Sex: Treating RN: 11-Jun-1946 (77 y.o. M) Primary Care Provider: Vilinda Boehringer, DHA NA [facility] Other Clinician: Referring Provider: Treating Provider/Extender: Estrella Myrtle SHI, DHA NA [facility] Weeks in Treatment: 6 Subjective Chief Complaint Information obtained from Patient Patient presents to the wound care center with open non-healing surgical wound(s) and pressure ulcers History of Present Illness (HPI) ADMISSION 01/09/2023 This is a 77 year old known diabetic who underwent a left ankle ORIF on Nov 26, 2022. He was seen by orthopedic surgery for follow-up on December 26, 2022 and was noted to have breakdown of his left lateral leg incision. He was also given a course of  Keflex at that time. He also developed pressure ulcers on his posterior Achilles area, his lateral calcaneus, and his medial malleolus from the boot he was wearing. He has a pressure ulcer on his right heel, as well. He was referred to the wound care center for further evaluation and management. 01/17/2023: All of the wounds are little bit smaller today with the exception of the one over his posterior Achilles on the left. This wound is bigger and has a fibrous layer to it that is densely adherent. There is slough accumulation on all of the wounds.  He says he has not been using the Prevalon boot because he frequently has to get up and urinate but he does report that he is floating his heels on pillows. 01/27/2023: The surgical incision site is healed. His medial ankle wound is down to just a couple of millimeters. His lateral left heel wound is also quite a bit smaller. There is minimal slough accumulation on this wound. The Achilles area on the left is still about the same size with thick, fibrotic, adherent non-viable tissue. The wound on his right heel is smaller. There is still some depth at the center with slough accumulation. 01/31/2023: The medial ankle wound has some eschar on the surface, underneath which it is about a millimeter in each dimension. The left lateral heel wound is down to a couple of millimeters with just some slough on the surface. The right calcaneal wound is also smaller with good epithelialization coming in. The Achilles area wound also continues to contract and the adherent nonviable tissue is much softer today. 02/07/2023: The left medial ankle wound is closed. The right calcaneal wound is very superficial and clean without any slough accumulation. The left lateral heel wound is also quite a bit smaller and appears similar to the right heel wound. The posterior ankle wound over the Achilles is contracting and there is a band of skin that has almost divided this wound in two.  The rubbery slough is still present but soft. 02/21/2023: The heel wounds have both closed. The wound on his posterior ankle at the Achilles is smaller and the band of skin has divided it into 2 separate areas. There is slough accumulation on the wound surface. No concern for infection. Patient History Information obtained from Patient, Caregiver, Chart. Family History Heart Disease - Mother,Father, Hypertension - Mother,Father, Stroke - Father, No family history of Cancer, Diabetes, Hereditary Spherocytosis, Kidney Disease, Lung Disease, Seizures, Thyroid Problems, Tuberculosis. Social History Never smoker, Marital Status - Married, Alcohol Use - Rarely, Drug Use - No History, Caffeine Use - Daily - coffee. Medical History Eyes Patient has history of Cataracts - bil extractions Denies history of Glaucoma, Optic Neuritis Ear/Nose/Mouth/Throat Denies history of Chronic sinus problems/congestion, Middle ear problems Respiratory Patient has history of Sleep Apnea Cardiovascular Patient has history of Hypertension Endocrine Denies history of Type I Diabetes, Type II Diabetes Genitourinary Denies history of End Stage Renal Disease Immunological Denies history of Lupus Erythematosus, Raynauds, Scleroderma Integumentary (Skin) Denies history of History of Burn Musculoskeletal Patient has history of Osteoarthritis Denies history of Gout, Rheumatoid Arthritis, Osteomyelitis Neurologic Denies history of Dementia, Neuropathy, Quadriplegia, Paraplegia, Seizure Disorder Oncologic Denies history of Received Chemotherapy, Received Radiation Psychiatric Denies history of Anorexia/bulimia, Confinement Anxiety Hospitalization/Surgery History - bil knee replacements. - ORIF left ankel 5/23 2024. - cholecystectomy. - cervical fusion. Medical A Surgical History Notes nd Respiratory hx aspiration PNA, asbestosis Gastrointestinal duodenitis, constipation Musculoskeletal ankylosing spondylitis,  compression fx T10 CAYSEN, TRELLA ([id number]) [id number] Page 6 of 9 Objective Constitutional no acute distress. Vitals Time Taken: 10:33 AM, Height: 72 in, Weight: 240 lbs, BMI: 32.5, Temperature: 97.6 F, Pulse: 60 bpm, Respiratory Rate: 18 breaths/min, Blood Pressure: 136/64 mmHg. Respiratory Normal work of breathing on room air. General Notes: 02/21/2023: The heel wounds have both closed. The wound on his posterior ankle at the Achilles is smaller and the band of skin has divided it into 2 separate areas. There is slough accumulation on the wound surface. No concern for infection. Integumentary (Hair, Skin) Wound #2 status is Open.  Original cause of wound was Pressure Injury. The date acquired was: 12/03/2022. The wound has been in treatment 6 weeks. The wound is located on the Left,Posterior Lower Leg. The wound measures 2.5cm length x 1.2cm width x 0.1cm depth; 2.356cm^2 area and 0.236cm^3 volume. There is Fat Layer (Subcutaneous Tissue) exposed. There is no tunneling or undermining noted. There is a medium amount of serosanguineous drainage noted. The wound margin is distinct with the outline attached to the wound base. There is small (1-33%) red granulation within the wound bed. There is a large (67-100%) amount of necrotic tissue within the wound bed including Adherent Slough. The periwound skin appearance had no abnormalities noted for texture. The periwound skin appearance had no abnormalities noted for moisture. The periwound skin appearance had no abnormalities noted for color. Periwound temperature was noted as No Abnormality. Wound #4 status is Healed - Epithelialized. Original cause of wound was Pressure Injury. The date acquired was: 12/03/2022. The wound has been in treatment 6 weeks. The wound is located on the Left,Lateral Foot. The wound measures 0cm length x 0cm width x 0cm depth; 0cm^2 area and 0cm^3 volume. There is no tunneling or  undermining noted. There is a none present amount of drainage noted. There is no granulation within the wound bed. There is no necrotic tissue within the wound bed. The periwound skin appearance had no abnormalities noted for texture. The periwound skin appearance had no abnormalities noted for moisture. The periwound skin appearance had no abnormalities noted for color. Periwound temperature was noted as No Abnormality. The periwound has tenderness on palpation. Wound #6 status is Healed - Epithelialized. Original cause of wound was Pressure Injury. The date acquired was: 12/03/2022. The wound has been in treatment 6 weeks. The wound is located on the Right Calcaneus. The wound measures 0cm length x 0cm width x 0cm depth; 0cm^2 area and 0cm^3 volume. There is no tunneling or undermining noted. There is a none present amount of drainage noted. There is no granulation within the wound bed. There is no necrotic tissue within the wound bed. The periwound skin appearance had no abnormalities noted for texture. The periwound skin appearance had no abnormalities noted for color. The periwound skin appearance exhibited: Dry/Scaly. Periwound temperature was noted as No Abnormality. Assessment Active Problems ICD-10 Pressure ulcer of left ankle, stage 3 Polyneuropathy, unspecified Procedures Wound #2 Pre-procedure diagnosis of Wound #2 is a Pressure Ulcer located on the Left,Posterior Lower Leg . There was a Selective/Open Wound Non-Viable Tissue Debridement with a total area of 2.36 sq cm performed by Duanne Guess, MD. With the following instrument(s): Curette to remove Non-Viable tissue/material. Material removed includes [facility] after achieving pain control using Lidocaine 4% Topical Solution. No specimens were taken. A time out was conducted at 10:55, prior to the start of the procedure. A Minimum amount of bleeding was controlled with Pressure. The procedure was tolerated well with a pain level of 0  throughout and a pain level of 0 following the procedure. Post Debridement Measurements: 2.5cm length x 1.2cm width x 0.1cm depth; 0.236cm^3 volume. Post debridement Stage noted as Category/Stage III. Character of Wound/Ulcer Post Debridement is improved. Post procedure Diagnosis Wound #2: Same as Pre-Procedure General Notes: Scribed for Dr. Lady Gary by Zenaida Deed, RN. Plan Follow-up Appointments: Return Appointment in 1 week. - Dr. Lady Gary RM 1 Wed 8/28 @ 11:15 am Anesthetic: Wound #2 Left,Posterior Lower Leg: (In clinic) Topical Lidocaine 4% applied to wound bed Bathing/ Shower/ Hygiene: EDRIEL, SOM ([id number]) [id number] Page  7 of 9 May shower and wash wound with soap and water. Off-Loading: DH Walker Boot to: - left foot per ortho Prevalon Boot - Prevalon boot to right foot especially while in bed Other: - Avoid pressure on the posterior (back ) of left lower leg Home Health: No change in wound care orders this week; continue Home Health for wound care. May utilize formulary equivalent dressing for wound treatment orders unless otherwise specified. Dressing changes to be completed by Home Health on Monday / Wednesday / Friday except when patient has scheduled visit at Wound Care Center. - [facility] to change dressing 3 times next week Other Home Health Orders/Instructions: Verna Czech fax- [phone] WOUND #2: - Lower Leg Wound Laterality: Left, Posterior Prim Dressing: Hydrofera Blue Ready Transfer Foam, 2.5x2.5 (in/in) 3 x Per Week/30 Days ary Discharge Instructions: Apply directly to wound bed as directed Prim Dressing: Santyl Ointment 3 x Per Week/30 Days ary Discharge Instructions: Apply nickel thick amount to wound bed as instructed Secondary Dressing: Woven Gauze Sponge, Non-Sterile 4x4 in 3 x Per Week/30 Days Discharge Instructions: Apply over primary dressing as directed. Secured With: Elastic Bandage 4 inch (ACE bandage) 3  x Per Week/30 Days Discharge Instructions: Secure with ACE bandage as directed. Secured With: [employer], 4.5x3.1 (in/yd) 3 x Per Week/30 Days Discharge Instructions: Secure with Kerlix as directed. 02/21/2023: The heel wounds have both closed. The wound on his posterior ankle at the Achilles is smaller and the band of skin has divided it into 2 separate areas. There is slough accumulation on the wound surface. No concern for infection. I used a curette to debride slough from the wound. We will continue Santyl with Hydrofera Blue with Kerlix and an Ace bandage. He was given a splint by orthopedics and we discussed ways to pad the splint to avoid new injuries. He will follow-up in 1 week. Electronic Signature(s) Signed: 02/21/2023 11:15:47 AM By: Duanne Guess MD FACS Entered By: Duanne Guess on 02/21/2023 08:15:47 -------------------------------------------------------------------------------- HxROS Details Patient Name: Date of Service: [name] M M. 02/21/2023 10:30 A M Medical Record Number: [id number] Patient Account Number: [id number] Date of Birth/Sex: Treating RN: April 15, 1946 (77 y.o. M) Primary Care Provider: Vilinda Boehringer, DHA NA [facility] Other Clinician: Referring Provider: Treating Provider/Extender: Estrella Myrtle SHI, DHA NA [facility] Weeks in Treatment: 6 Information Obtained From Patient Caregiver Chart Eyes Medical History: Positive for: Cataracts - bil extractions Negative for: Glaucoma; Optic Neuritis Ear/Nose/Mouth/Throat Medical History: Negative for: Chronic sinus problems/congestion; Middle ear problems Respiratory Medical History: Positive for: Sleep Apnea Past Medical History Notes: hx aspiration PNA, asbestosis Cardiovascular Medical History: Positive for: Hypertension KANAI, DEBOLT ([id number]) [id number] Page 8 of 9 Gastrointestinal Medical History: Past Medical History Notes: duodenitis,  constipation Endocrine Medical History: Negative for: Type I Diabetes; Type II Diabetes Genitourinary Medical History: Negative for: End Stage Renal Disease Immunological Medical History: Negative for: Lupus Erythematosus; Raynauds; Scleroderma Integumentary (Skin) Medical History: Negative for: History of Burn Musculoskeletal Medical History: Positive for: Osteoarthritis Negative for: Gout; Rheumatoid Arthritis; Osteomyelitis Past Medical History Notes: ankylosing spondylitis, compression fx T10 Neurologic Medical History: Negative for: Dementia; Neuropathy; Quadriplegia; Paraplegia; Seizure Disorder Oncologic Medical History: Negative for: Received Chemotherapy; Received Radiation Psychiatric Medical History: Negative for: Anorexia/bulimia; Confinement Anxiety HBO Extended History Items Eyes: Cataracts Immunizations Pneumococcal Vaccine: Received Pneumococcal Vaccination: Yes Received Pneumococcal Vaccination On or After 60th Birthday: Yes Implantable Devices Yes Hospitalization / Surgery History Type of Hospitalization/Surgery bil knee replacements ORIF left ankel 5/23 2024  cholecystectomy cervical fusion Family and Social History Cancer: No; Diabetes: No; Heart Disease: Yes - Mother,Father; Hereditary Spherocytosis: No; Hypertension: Yes - Mother,Father; Kidney Disease: No; Lung Disease: No; Seizures: No; Stroke: Yes - Father; Thyroid Problems: No; Tuberculosis: No; Never smoker; Marital Status - Married; Alcohol Use: Rarely; Drug Use: No History; Caffeine Use: Daily - coffee; Financial Concerns: No; Food, Clothing or Shelter Needs: No; Support System Lacking: No; Transportation Concerns: No Psychologist, prison and probation services) Signed: 02/21/2023 12:39:46 PM By: Duanne Guess MD FACS Elenore Paddy 12:39:46 PM By: Duanne Guess MD FACS Signed: 02/21/2023 ([id number]) [id number] Page 9 of 9 Entered By: Duanne Guess on 02/21/2023  08:10:22 -------------------------------------------------------------------------------- SuperBill Details Patient Name: Date of Service: [facility] [name] 02/21/2023 Medical Record Number: [id number] Patient Account Number: [id number] Date of Birth/Sex: Treating RN: February 28, 1946 (77 y.o. M) Primary Care Provider: Vilinda Boehringer, DHA NA [facility] Other Clinician: Referring Provider: Treating Provider/Extender: Estrella Myrtle SHI, DHA NA [facility] Weeks in Treatment: 6 Diagnosis Coding ICD-10 Codes Code Description 6847098651 Pressure ulcer of left ankle, stage 3 G62.9 Polyneuropathy, unspecified Facility Procedures : CPT4 Code: [id number] Description: 97597 - DEBRIDE WOUND 1ST 20 SQ CM OR < ICD-10 Diagnosis Description L89.523 Pressure ulcer of left ankle, stage 3 Modifier: Quantity: 1 Physician Procedures : CPT4 Code Description Modifier [id number] 99214 - WC PHYS LEVEL 4 - EST PT 25 ICD-10 Diagnosis Description L89.523 Pressure ulcer of left ankle, stage 3 G62.9 Polyneuropathy, unspecified Quantity: 1 : [id number] 97597 - WC PHYS DEBR WO ANESTH 20 SQ CM ICD-10 Diagnosis Description L89.523 Pressure ulcer of left ankle, stage 3 Quantity: 1 Electronic Signature(s) Signed: 02/21/2023 11:16:16 AM By: Duanne Guess MD FACS Entered By: Duanne Guess on 02/21/2023 08:16:16

## 2023-02-23 ENCOUNTER — Encounter (HOSPITAL_BASED_OUTPATIENT_CLINIC_OR_DEPARTMENT_OTHER): Payer: Medicare Other | Admitting: Physical Therapy

## 2023-02-27 ENCOUNTER — Ambulatory Visit (HOSPITAL_BASED_OUTPATIENT_CLINIC_OR_DEPARTMENT_OTHER): Payer: Medicare Other | Admitting: Physical Therapy

## 2023-03-01 ENCOUNTER — Encounter (HOSPITAL_BASED_OUTPATIENT_CLINIC_OR_DEPARTMENT_OTHER): Payer: Medicare Other | Admitting: Physical Therapy

## 2023-03-01 ENCOUNTER — Encounter (HOSPITAL_BASED_OUTPATIENT_CLINIC_OR_DEPARTMENT_OTHER): Payer: Medicare Other | Admitting: General Surgery

## 2023-03-01 DIAGNOSIS — L97322 Non-pressure chronic ulcer of left ankle with fat layer exposed: Secondary | ICD-10-CM | POA: Diagnosis not present

## 2023-03-01 NOTE — Progress Notes (Signed)
Neil, [name] ([id number]) [id number] Page 1 of 9 Visit Report for 03/01/2023 Chief Complaint Document Details Patient Name: Date of Service: [facility] [name]. 03/01/2023 11:15 A [name] Medical Record Number: [id number] Patient Account Number: [id number] Date of Birth/Sex: Treating RN: 1945/11/29 (77 y.o. [name] Primary Care Provider: Vilinda [name], DHA NA [facility] Other Clinician: Referring Provider: Treating Provider/Extender: [name], DHA NA [name] in Treatment: 7 Information Obtained from: Patient Chief Complaint Patient presents to the wound care center with open non-healing surgical wound(s) and pressure ulcers Electronic Signature(s) Signed: 03/01/2023 11:56:23 AM By: Neil Guess MD FACS Entered By: [name] on 03/01/2023 11:56:23 -------------------------------------------------------------------------------- Debridement Details Patient Name: Date of Service: [name]. 03/01/2023 11:15 A [name] Medical Record Number: [id number] Patient Account Number: [id number] Date of Birth/Sex: Treating RN: February 26, 1946 (77 y.o. [name] Primary Care Provider: Vilinda [name], DHA NA [facility] Other Clinician: Referring Provider: Treating Provider/Extender: [name], DHA NA [name] in Treatment: 7 Debridement Performed for Assessment: Wound #2 Left,Posterior Lower Leg Performed By: Physician Neil Guess, MD Debridement Type: Debridement Level of Consciousness (Pre-procedure): Awake and Alert Pre-procedure Verification/Time Out Yes - 11:45 Taken: Start Time: 11:45 Pain Control: Lidocaine 4% T opical Solution Percent of Wound Bed Debrided: 100% T Area Debrided (cm): otal 0.88 Tissue and other material debrided: Viable, Non-Viable, Slough, Subcutaneous, Slough Level: Skin/Subcutaneous Tissue Debridement Description: Excisional Instrument: Curette Bleeding: Minimum Hemostasis  Achieved: Pressure Procedural Pain: 0 Post Procedural Pain: 0 Response to Treatment: Procedure was tolerated well Level of Consciousness (Post- Awake and Alert procedure): Post Debridement Measurements of Total Wound Length: (cm) 1.4 Stage: Category/Stage III Width: (cm) 0.8 Depth: (cm) 0.1 Volume: (cm) 0.088 Character of Wound/Ulcer Post Debridement: Improved Post Procedure Diagnosis Neil, [name] ([id number]) [id number] Page 2 of 9 Same as Pre-procedure Notes Scribed for Dr. Lady [name] by Neil Deed, RN Electronic Signature(s) Signed: 03/01/2023 3:38:16 PM By: Neil Guess MD FACS Signed: 03/01/2023 4:47:14 PM By: Neil Deed RN, BSN Entered By: [name] on 03/01/2023 11:47:05 -------------------------------------------------------------------------------- HPI Details Patient Name: Date of Service: [name]. 03/01/2023 11:15 A [name] Medical Record Number: [id number] Patient Account Number: [id number] Date of Birth/Sex: Treating RN: [birth year] (77 y.o. [name] Primary Care Provider: Vilinda [name], DHA NA [facility] Other Clinician: Referring Provider: Treating Provider/Extender: [name], DHA NA [name] in Treatment: 7 History of Present Illness HPI Description: ADMISSION 01/09/2023 This is a 77 year old known diabetic who underwent a left ankle ORIF on Nov 26, 2022. He was seen by orthopedic surgery for follow-up on December 26, 2022 and was noted to have breakdown of his left lateral leg incision. He was also given a course of Keflex at that time. He also developed pressure ulcers on his posterior Achilles area, his lateral calcaneus, and his medial malleolus from the boot he was wearing. He has a pressure ulcer on his right heel, as well. He was referred to the wound care center for further evaluation and management. 01/17/2023: All of the wounds are little bit smaller today with the exception of the one  over his posterior Achilles on the left. This wound is bigger and has a fibrous layer to it that is densely adherent. There is slough accumulation on all of the wounds. He says he has not been using the Prevalon boot because he frequently has to get up and urinate but he does report that he is floating his heels  on pillows. 01/27/2023: The surgical incision site is healed. His medial ankle wound is down to just a couple of millimeters. His lateral left heel wound is also quite a bit smaller. There is minimal slough accumulation on this wound. The Achilles area on the left is still about the same size with thick, fibrotic, adherent non-viable tissue. The wound on his right heel is smaller. There is still some depth at the center with slough accumulation. 01/31/2023: The medial ankle wound has some eschar on the surface, underneath which it is about a millimeter in each dimension. The left lateral heel wound is down to a couple of millimeters with just some slough on the surface. The right calcaneal wound is also smaller with good epithelialization coming in. The Achilles area wound also continues to contract and the adherent nonviable tissue is much softer today. 02/07/2023: The left medial ankle wound is closed. The right calcaneal wound is very superficial and clean without any slough accumulation. The left lateral heel wound is also quite a bit smaller and appears similar to the right heel wound. The posterior ankle wound over the Achilles is contracting and there is a band of skin that has almost divided this wound in two. The rubbery slough is still present but soft. 02/21/2023: The heel wounds have both closed. The wound on his posterior ankle at the Achilles is smaller and the band of skin has divided it into 2 separate areas. There is slough accumulation on the wound surface. No concern for infection. 03/01/2023: The wound on his posterior ankle over the Achilles is about half the size as it was last  week. The entire top portion has epithelialized. There is still a layer of rubbery slough that has accumulated. No concern for infection. Electronic Signature(s) Signed: 03/01/2023 11:57:02 AM By: Neil Guess MD FACS Entered By: [name] on 03/01/2023 11:57:02 -------------------------------------------------------------------------------- Physical Exam Details Patient Name: Date of Service: [name]. 03/01/2023 11:15 A [name] Medical Record Number: [id number] Patient Account Number: [id number] Date of Birth/Sex: Treating RN: Jul 08, 1945 (77 y.o. [name] Primary Care Provider: Vilinda [name], [facility] NA [facility] Other Clinician: Referring Provider: Treating Provider/Extender: [name], DHA NA [name] in Treatment: 7 [name], [name] ([id number]) [id number] Page 3 of 9 Constitutional Hypertensive, asymptomatic. . . . no acute distress. Respiratory Normal work of breathing on room air. Notes 03/01/2023: The wound on his posterior ankle over the Achilles is about half the size as it was last week. The entire top portion has epithelialized. There is still a layer of rubbery slough that has accumulated. No concern for infection. Electronic Signature(s) Signed: 03/01/2023 11:57:35 AM By: Neil Guess MD FACS Entered By: [name] on 03/01/2023 11:57:35 -------------------------------------------------------------------------------- Physician Orders Details Patient Name: Date of Service: [name]. 03/01/2023 11:15 A [name] Medical Record Number: [id number] Patient Account Number: [id number] Date of Birth/Sex: Treating RN: September 04, 1945 (77 y.o. [name] Primary Care Provider: Vilinda [name], DHA NA [facility] Other Clinician: Referring Provider: Treating Provider/Extender: [name], DHA NA [name] in Treatment: 7 Verbal / Phone Orders: No Diagnosis Coding ICD-10 Coding Code  Description [phone] Pressure ulcer of left ankle, stage 3 G62.9 Polyneuropathy, unspecified Follow-up Appointments ppointment in 1 week. - Dr. Lady [name] RM 1 Return A Wed 9/3 @ 1:15 pm Anesthetic Wound #2 Left,Posterior Lower Leg (In clinic) Topical Lidocaine 4% applied to wound bed Bathing/ Shower/ Hygiene May shower and wash wound with soap and water.  Off-Loading DH Walker Boot to: - left foot per ortho Prevalon Boot - Prevalon boot to right foot especially while in bed Other: - Avoid pressure on the posterior (back ) of left lower leg Home Health No change in wound care orders this week; continue Home Health for wound care. May utilize formulary equivalent dressing for wound treatment orders unless otherwise specified. Dressing changes to be completed by Home Health on Monday / Wednesday / Friday except when patient has scheduled visit at Wound Care Center. - [facility] to change dressing 3 times next week Other Home Health Orders/Instructions: Verna Czech fax- [phone] Wound Treatment Wound #2 - Lower Leg Wound Laterality: Left, Posterior Prim Dressing: Hydrofera Blue Ready Transfer Foam, 2.5x2.5 (in/in) 3 x Per Week/30 Days ary Discharge Instructions: Apply directly to wound bed as directed Prim Dressing: Santyl Ointment 3 x Per Week/30 Days ary Discharge Instructions: Apply nickel thick amount to wound bed as instructed Secondary Dressing: Woven Gauze Sponge, Non-Sterile 4x4 in 3 x Per Week/30 Days Discharge Instructions: Apply over primary dressing as directed. Secured With: Elastic Bandage 4 inch (ACE bandage) 3 x Per Week/30 Days [name], [name] ([id number]) [id number] Page 4 of 9 Discharge Instructions: Secure with ACE bandage as directed. Secured With: [employer], 4.5x3.1 (in/yd) 3 x Per Week/30 Days Discharge Instructions: Secure with Kerlix as directed. Electronic Signature(s) Signed: 03/01/2023 3:38:16 PM By: Neil Guess MD FACS Entered By: [name] on 03/01/2023 11:59:43 -------------------------------------------------------------------------------- Problem List Details Patient Name: Date of Service: [name]. 03/01/2023 11:15 A [name] Medical Record Number: [id number] Patient Account Number: [id number] Date of Birth/Sex: Treating RN: 02-11-1946 (77 y.o. [name] Primary Care Provider: Vilinda [name], DHA NA [facility] Other Clinician: Referring Provider: Treating Provider/Extender: [name], DHA NA [name] in Treatment: 7 Active Problems ICD-10 Encounter Code Description Active Date MDM Diagnosis L89.523 Pressure ulcer of left ankle, stage 3 01/09/2023 No Yes G62.9 Polyneuropathy, unspecified 01/09/2023 No Yes Inactive Problems Resolved Problems ICD-10 Code Description Active Date Resolved Date L97.322 Non-pressure chronic ulcer of left ankle with fat layer exposed 01/09/2023 01/09/2023 L89.623 Pressure ulcer of left heel, stage 3 01/09/2023 01/09/2023 L89.613 Pressure ulcer of right heel, stage 3 01/09/2023 01/09/2023 Electronic Signature(s) Signed: 03/01/2023 11:56:11 AM By: Neil Guess MD FACS Entered By: [name] on 03/01/2023 11:56:11 -------------------------------------------------------------------------------- Progress Note Details Patient Name: Date of Service: [name]. 03/01/2023 11:15 A Serita Grammes ([id number]) [id number] Page 5 of 9 Medical Record Number: [id number] Patient Account Number: [id number] Date of Birth/Sex: Treating RN: 11-Feb-1946 (77 y.o. [name] Primary Care Provider: Vilinda [name], DHA NA [facility] Other Clinician: Referring Provider: Treating Provider/Extender: [name], DHA NA [name] in Treatment: 7 Subjective Chief Complaint Information obtained from Patient Patient presents to the wound care center with open non-healing surgical wound(s) and  pressure ulcers History of Present Illness (HPI) ADMISSION 01/09/2023 This is a 77 year old known diabetic who underwent a left ankle ORIF on Nov 26, 2022. He was seen by orthopedic surgery for follow-up on December 26, 2022 and was noted to have breakdown of his left lateral leg incision. He was also given a course of Keflex at that time. He also developed pressure ulcers on his posterior Achilles area, his lateral calcaneus, and his medial malleolus from the boot he was wearing. He has a pressure ulcer on his right heel, as well. He was referred to the wound care center for further  evaluation and management. 01/17/2023: All of the wounds are little bit smaller today with the exception of the one over his posterior Achilles on the left. This wound is bigger and has a fibrous layer to it that is densely adherent. There is slough accumulation on all of the wounds. He says he has not been using the Prevalon boot because he frequently has to get up and urinate but he does report that he is floating his heels on pillows. 01/27/2023: The surgical incision site is healed. His medial ankle wound is down to just a couple of millimeters. His lateral left heel wound is also quite a bit smaller. There is minimal slough accumulation on this wound. The Achilles area on the left is still about the same size with thick, fibrotic, adherent non-viable tissue. The wound on his right heel is smaller. There is still some depth at the center with slough accumulation. 01/31/2023: The medial ankle wound has some eschar on the surface, underneath which it is about a millimeter in each dimension. The left lateral heel wound is down to a couple of millimeters with just some slough on the surface. The right calcaneal wound is also smaller with good epithelialization coming in. The Achilles area wound also continues to contract and the adherent nonviable tissue is much softer today. 02/07/2023: The left medial ankle wound is closed. The  right calcaneal wound is very superficial and clean without any slough accumulation. The left lateral heel wound is also quite a bit smaller and appears similar to the right heel wound. The posterior ankle wound over the Achilles is contracting and there is a band of skin that has almost divided this wound in two. The rubbery slough is still present but soft. 02/21/2023: The heel wounds have both closed. The wound on his posterior ankle at the Achilles is smaller and the band of skin has divided it into 2 separate areas. There is slough accumulation on the wound surface. No concern for infection. 03/01/2023: The wound on his posterior ankle over the Achilles is about half the size as it was last week. The entire top portion has epithelialized. There is still a layer of rubbery slough that has accumulated. No concern for infection. Patient History Information obtained from Patient, Caregiver, Chart. Family History Heart Disease - Mother,Father, Hypertension - Mother,Father, Stroke - Father, No family history of Cancer, Diabetes, Hereditary Spherocytosis, Kidney Disease, Lung Disease, Seizures, Thyroid Problems, Tuberculosis. Social History Never smoker, Marital Status - Married, Alcohol Use - Rarely, Drug Use - No History, Caffeine Use - Daily - coffee. Medical History Eyes Patient has history of Cataracts - bil extractions Denies history of Glaucoma, Optic Neuritis Ear/Nose/Mouth/Throat Denies history of Chronic sinus problems/congestion, Middle ear problems Respiratory Patient has history of Sleep Apnea Cardiovascular Patient has history of Hypertension Endocrine Denies history of Type I Diabetes, Type II Diabetes Genitourinary Denies history of End Stage Renal Disease Immunological Denies history of Lupus Erythematosus, Raynauds, Scleroderma Integumentary (Skin) Denies history of History of Burn Musculoskeletal Patient has history of Osteoarthritis Denies history of Gout, Rheumatoid  Arthritis, Osteomyelitis Neurologic Denies history of Dementia, Neuropathy, Quadriplegia, Paraplegia, Seizure Disorder Oncologic Denies history of Received Chemotherapy, Received Radiation Psychiatric Denies history of Anorexia/bulimia, Confinement Anxiety Hospitalization/Surgery History - bil knee replacements. - ORIF left ankel 5/23 2024. - cholecystectomy. - cervical fusion. Medical A Surgical History Notes nd Respiratory hx aspiration PNA, asbestosis Gastrointestinal duodenitis, constipation Musculoskeletal JHONATHON, ROSENBOOM ([id number]) [id number] Page 6 of 9 ankylosing spondylitis, compression fx T10 Objective  Constitutional Hypertensive, asymptomatic. no acute distress. Vitals Time Taken: 11:25 AM, Height: 72 in, Weight: 240 lbs, BMI: 32.5, Temperature: 98.4 F, Pulse: 61 bpm, Respiratory Rate: 18 breaths/min, Blood Pressure: 151/71 mmHg. Respiratory Normal work of breathing on room air. General Notes: 03/01/2023: The wound on his posterior ankle over the Achilles is about half the size as it was last week. The entire top portion has epithelialized. There is still a layer of rubbery slough that has accumulated. No concern for infection. Integumentary (Hair, Skin) Wound #2 status is Open. Original cause of wound was Pressure Injury. The date acquired was: 12/03/2022. The wound has been in treatment 7 [name]. The wound is located on the Left,Posterior Lower Leg. The wound measures 1.4cm length x 0.8cm width x 0.1cm depth; 0.88cm^2 area and 0.088cm^3 volume. There is Fat Layer (Subcutaneous Tissue) exposed. There is no tunneling or undermining noted. There is a small amount of serosanguineous drainage noted. The wound margin is distinct with the outline attached to the wound base. There is medium (34-66%) pink granulation within the wound bed. There is a medium (34-66%) amount of necrotic tissue within the wound bed including Adherent Slough. The  periwound skin appearance had no abnormalities noted for texture. The periwound skin appearance had no abnormalities noted for moisture. The periwound skin appearance had no abnormalities noted for color. Periwound temperature was noted as No Abnormality. Assessment Active Problems ICD-10 Pressure ulcer of left ankle, stage 3 Polyneuropathy, unspecified Procedures Wound #2 Pre-procedure diagnosis of Wound #2 is a Pressure Ulcer located on the Left,Posterior Lower Leg . There was a Excisional Skin/Subcutaneous Tissue Debridement with a total area of 0.88 sq cm performed by Neil Guess, MD. With the following instrument(s): Curette to remove Viable and Non-Viable tissue/material. Material removed includes Subcutaneous Tissue and Slough and after achieving pain control using Lidocaine 4% T opical Solution. No specimens were taken. A time out was conducted at 11:45, prior to the start of the procedure. A Minimum amount of bleeding was controlled with Pressure. The procedure was tolerated well with a pain level of 0 throughout and a pain level of 0 following the procedure. Post Debridement Measurements: 1.4cm length x 0.8cm width x 0.1cm depth; 0.088cm^3 volume. Post debridement Stage noted as Category/Stage III. Character of Wound/Ulcer Post Debridement is improved. Post procedure Diagnosis Wound #2: Same as Pre-Procedure General Notes: Scribed for Dr. Lady [name] by Neil Deed, RN. Plan Follow-up Appointments: Return Appointment in 1 week. - Dr. Lady [name] RM 1 Wed 9/3 @ 1:15 pm Anesthetic: Wound #2 Left,Posterior Lower Leg: (In clinic) Topical Lidocaine 4% applied to wound bed Bathing/ Shower/ Hygiene: May shower and wash wound with soap and water. Off-Loading: DH Walker Boot to: - left foot per ortho Prevalon Boot - Prevalon boot to right foot especially while in bed Other: - Avoid pressure on the posterior (back ) of left lower leg Home Health: No change in wound care orders this  week; continue Home Health for wound care. May utilize formulary equivalent dressing for wound treatment orders unless otherwise specified. Dressing changes to be completed by Home Health on Monday / Wednesday / Friday except when patient has scheduled visit at [facility]. - [facility] to [name], [name] ([id number]) [id number] Page 7 of 9 change dressing 3 times next week Other Home Health Orders/Instructions: - Verna Czech fax- [phone] WOUND #2: - Lower Leg Wound Laterality: Left, Posterior Prim Dressing: Hydrofera Blue Ready Transfer Foam, 2.5x2.5 (in/in) 3 x Per Week/30 Days ary Discharge Instructions: Apply directly  to wound bed as directed Prim Dressing: Santyl Ointment 3 x Per Week/30 Days ary Discharge Instructions: Apply nickel thick amount to wound bed as instructed Secondary Dressing: Woven Gauze Sponge, Non-Sterile 4x4 in 3 x Per Week/30 Days Discharge Instructions: Apply over primary dressing as directed. Secured With: Elastic Bandage 4 inch (ACE bandage) 3 x Per Week/30 Days Discharge Instructions: Secure with ACE bandage as directed. Secured With: [employer], 4.5x3.1 (in/yd) 3 x Per Week/30 Days Discharge Instructions: Secure with Kerlix as directed. 03/01/2023: The wound on his posterior ankle over the Achilles is about half the size as it was last week. The entire top portion has epithelialized. There is still a layer of rubbery slough that has accumulated. No concern for infection. I used a curette to debride slough and subcutaneous tissue from the posterior ankle wound. We will continue Santyl with Hydrofera Blue with Kerlix and Ace compression. Follow-up in 1 week. Electronic Signature(s) Signed: 03/01/2023 12:00:16 PM By: Neil Guess MD FACS Entered By: [name] on 03/01/2023 12:00:16 -------------------------------------------------------------------------------- HxROS Details Patient Name: Date of  Service: [name]. 03/01/2023 11:15 A [name] Medical Record Number: [id number] Patient Account Number: [id number] Date of Birth/Sex: Treating RN: 1945-10-31 (77 y.o. [name] Primary Care Provider: Vilinda [name], DHA NA [facility] Other Clinician: Referring Provider: Treating Provider/Extender: [name], DHA NA [name] in Treatment: 7 Information Obtained From Patient Caregiver Chart Eyes Medical History: Positive for: Cataracts - bil extractions Negative for: Glaucoma; Optic Neuritis Ear/Nose/Mouth/Throat Medical History: Negative for: Chronic sinus problems/congestion; Middle ear problems Respiratory Medical History: Positive for: Sleep Apnea Past Medical History Notes: hx aspiration PNA, asbestosis Cardiovascular Medical History: Positive for: Hypertension Gastrointestinal Medical History: Past Medical History Notes: duodenitis, constipation Endocrine [name], [name] ([id number]) [id number] Page 8 of 9 Medical History: Negative for: Type I Diabetes; Type II Diabetes Genitourinary Medical History: Negative for: End Stage Renal Disease Immunological Medical History: Negative for: Lupus Erythematosus; Raynauds; Scleroderma Integumentary (Skin) Medical History: Negative for: History of Burn Musculoskeletal Medical History: Positive for: Osteoarthritis Negative for: Gout; Rheumatoid Arthritis; Osteomyelitis Past Medical History Notes: ankylosing spondylitis, compression fx T10 Neurologic Medical History: Negative for: Dementia; Neuropathy; Quadriplegia; Paraplegia; Seizure Disorder Oncologic Medical History: Negative for: Received Chemotherapy; Received Radiation Psychiatric Medical History: Negative for: Anorexia/bulimia; Confinement Anxiety HBO Extended History Items Eyes: Cataracts Immunizations Pneumococcal Vaccine: Received Pneumococcal Vaccination: Yes Received Pneumococcal Vaccination On  or After 60th Birthday: Yes Implantable Devices Yes Hospitalization / Surgery History Type of Hospitalization/Surgery bil knee replacements ORIF left ankel 5/23 2024 cholecystectomy cervical fusion Family and Social History Cancer: No; Diabetes: No; Heart Disease: Yes - Mother,Father; Hereditary Spherocytosis: No; Hypertension: Yes - Mother,Father; Kidney Disease: No; Lung Disease: No; Seizures: No; Stroke: Yes - Father; Thyroid Problems: No; Tuberculosis: No; Never smoker; Marital Status - Married; Alcohol Use: Rarely; Drug Use: No History; Caffeine Use: Daily - coffee; Financial Concerns: No; Food, Clothing or Shelter Needs: No; Support System Lacking: No; Transportation Concerns: No Psychologist, prison and probation services) Signed: 03/01/2023 3:38:16 PM By: Neil Guess MD FACS Signed: 03/01/2023 4:47:14 PM By: Neil Deed RN, BSN Entered By: [name] on 03/01/2023 11:57:14 [name] ([id number]) [id number] Page 9 of 9 -------------------------------------------------------------------------------- SuperBill Details Patient Name: Date of Service: [facility] [name] 03/01/2023 Medical Record Number: [id number] Patient Account Number: [id number] Date of Birth/Sex: Treating RN: [birth year] (77 y.o. 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

## 2023-03-01 NOTE — Progress Notes (Signed)
TIRRELL, LUELLEN ([id number]) [id number] Page 1 of 7 Visit Report for 03/01/2023 Arrival Information Details Patient Name: Date of Service: [facility] [name]. 03/01/2023 11:15 A M Medical Record Number: [id number] Patient Account Number: [id number] Date of Birth/Sex: Treating RN: 1946/06/16 (77 y.o. [name] Primary Care Marley Pakula: Vilinda Boehringer, DHA NA [facility] Other Clinician: Referring Makayela Secrest: Treating Denica Web/Extender: Estrella Myrtle SHI, DHA NA [facility] We[facility] in Treatment: 7 Visit Information History Since Last Visit Added or deleted any medications: No Patient Arrived: Wheel Chair Any new allergies or adverse reactions: No Arrival Time: 11:25 Had a fall or experienced change in No Accompanied By: spouse activities of daily living that may affect Transfer Assistance: None risk of falls: Patient Identification Verified: Yes Signs or symptoms of abuse/neglect since No Secondary Verification Process Completed: Yes last visito Patient Requires Transmission-Based Precautions: No Hospitalized since last visit: No Patient Has Alerts: No Implantable device outside of the clinic No excluding cellular tissue based products placed in the center since last visit: Has Dressing in Place as Prescribed: Yes Has Footwear/Offloading in Place as Yes Prescribed: Left: Surgical Shoe with Pressure Relief Insole Pain Present Now: No Electronic Signature(s) Signed: 03/01/2023 4:47:14 PM By: Zenaida Deed RN, BSN Entered By: Zenaida Deed on 03/01/2023 11:27:02 -------------------------------------------------------------------------------- Encounter Discharge Information Details Patient Name: Date of Service: Neil Pearson M M. 03/01/2023 11:15 A M Medical Record Number: [id number] Patient Account Number: [id number] Date of Birth/Sex: Treating RN: 05/18/1946 (77 y.o. [name] Primary Care Kaileia Flow: Vilinda Boehringer, DHA NA [facility] Other  Clinician: Referring Riel Hirschman: Treating Yarelis Ambrosino/Extender: Estrella Myrtle SHI, DHA NA [facility] Weeks in Treatment: 7 Encounter Discharge Information Items Post Procedure Vitals Discharge Condition: Stable Temperature (F): 98.4 Ambulatory Status: Ambulatory Pulse (bpm): 61 Discharge Destination: Home Respiratory Rate (breaths/min): 18 Transportation: Private Auto Blood Pressure (mmHg): 151/71 Accompanied By: spouse Schedule Follow-up Appointment: Yes Clinical Summary of Care: Patient Declined Electronic Signature(s) Signed: 03/01/2023 4:47:14 PM By: Zenaida Deed RN, BSN Entered By: Zenaida Deed on 03/01/2023 12:05:02 [name] ([id number]) [id number] Page 2 of 7 -------------------------------------------------------------------------------- Lower Extremity Assessment Details Patient Name: Date of Service: [facility] [name]. 03/01/2023 11:15 A M Medical Record Number: [id number] Patient Account Number: [id number] Date of Birth/Sex: Treating RN: Jan 21, 1946 (77 y.o. [name] Primary Care Milano Rosevear: Vilinda Boehringer, DHA NA [facility] Other Clinician: Referring Tryson Lumley: Treating Anaria Kroner/Extender: Estrella Myrtle SHI, DHA NA [facility] Weeks in Treatment: 7 Edema Assessment Assessed: [Left: No] [Right: No] Edema: [Left: Ye] [Right: s] Calf Left: Right: Point of Measurement: From Medial Instep 38.5 cm Ankle Left: Right: Point of Measurement: From Medial Instep 28.1 cm Vascular Assessment Pulses: Dorsalis Pedis Palpable: [Left:Yes] Extremity colors, hair growth, and conditions: Extremity Color: [Left:Normal] Hair Growth on Extremity: [Left:Yes] Temperature of Extremity: [Left:Warm] Capillary Refill: [Left:< 3 seconds] Dependent Rubor: [Left:No No] Electronic Signature(s) Signed: 03/01/2023 4:47:14 PM By: Zenaida Deed RN, BSN Entered By: Zenaida Deed on 03/01/2023  11:36:05 -------------------------------------------------------------------------------- Multi Wound Chart Details Patient Name: Date of Service: Neil Pearson M M. 03/01/2023 11:15 A M Medical Record Number: [id number] Patient Account Number: [id number] Date of Birth/Sex: Treating RN: 1946-06-16 (77 y.o. [name] Primary Care Adiel Erney: Vilinda Boehringer, DHA NA [facility] Other Clinician: Referring Shuan Statzer: Treating Camisha Srey/Extender: Estrella Myrtle SHI, DHA NA [facility] Weeks in Treatment: 7 Vital Signs Height(in): 72 Pulse(bpm): 61 Weight(lbs): 240 Blood Pressure(mmHg): 151/71 Body Mass Index(BMI): 32.5 Temperature(F): 98.4 Respiratory Rate(breaths/min): [address] ([id number]) [2:Photos:] [N/A:N/A] Left, Posterior  Lower Leg N/A N/A Wound Location: Pressure Injury N/A N/A Wounding Event: Pressure Ulcer N/A N/A Primary Etiology: Cataracts, Sleep Apnea, Hypertension, N/A N/A Comorbid History: Osteoarthritis 12/03/2022 N/A N/A Date Acquired: 7 N/A N/A Weeks of Treatment: Open N/A N/A Wound Status: No N/A N/A Wound Recurrence: 1.4x0.8x0.1 N/A N/A Measurements L x W x D (cm) 0.88 N/A N/A A (cm) : rea 0.088 N/A N/A Volume (cm) : 25.30% N/A N/A % Reduction in A rea: 25.40% N/A N/A % Reduction in Volume: Category/Stage III N/A N/A Classification: Small N/A N/A Exudate A mount: Serosanguineous N/A N/A Exudate Type: red, brown N/A N/A Exudate Color: Distinct, outline attached N/A N/A Wound Margin: Medium (34-66%) N/A N/A Granulation A mount: Pink N/A N/A Granulation Quality: Medium (34-66%) N/A N/A Necrotic A mount: Fat Layer (Subcutaneous Tissue): Yes N/A N/A Exposed Structures: Fascia: No Tendon: No Muscle: No Joint: No Bone: No Small (1-33%) N/A N/A Epithelialization: Debridement - Excisional N/A N/A Debridement: Pre-procedure Verification/Time Out 11:45 N/A N/A Taken: Lidocaine 4% Topical Solution N/A N/A Pain  Control: Subcutaneous, Slough N/A N/A Tissue Debrided: Skin/Subcutaneous Tissue N/A N/A Level: 0.88 N/A N/A Debridement A (sq cm): rea Curette N/A N/A Instrument: Minimum N/A N/A Bleeding: Pressure N/A N/A Hemostasis A chieved: 0 N/A N/A Procedural Pain: 0 N/A N/A Post Procedural Pain: Procedure was tolerated well N/A N/A Debridement Treatment Response: 1.4x0.8x0.1 N/A N/A Post Debridement Measurements L x W x D (cm) 0.088 N/A N/A Post Debridement Volume: (cm) Category/Stage III N/A N/A Post Debridement Stage: No Abnormalities Noted N/A N/A Periwound Skin Texture: No Abnormalities Noted N/A N/A Periwound Skin Moisture: No Abnormalities Noted N/A N/A Periwound Skin Color: No Abnormality N/A N/A Temperature: Debridement N/A N/A Procedures Performed: Treatment Notes Electronic Signature(s) Signed: 03/01/2023 11:56:17 AM By: Duanne Guess MD FACS Signed: 03/01/2023 4:47:14 PM By: Zenaida Deed RN, BSN Entered By: Duanne Guess on 03/01/2023 11:56:17 -------------------------------------------------------------------------------- Multi-Disciplinary Care Plan Details Patient Name: Date of Service: [facility] Neil Coons M M. 03/01/2023 11:15 A M Medical Record Number: [id number] Patient Account Number: [id number] [name] ([id number]) [id number] Page 4 of 7 Date of Birth/Sex: Treating RN: 03-12-1946 (77 y.o. [name] Primary Care Abel Ra: Vilinda Boehringer, DHA NA [facility] Other Clinician: Referring Staphanie Harbison: Treating Jodie Leiner/Extender: Estrella Myrtle SHI, DHA NA [facility] Weeks in Treatment: 7 Multidisciplinary Care Plan reviewed with physician Active Inactive Abuse / Safety / Falls / Self Care Management Nursing Diagnoses: Potential for falls Goals: Patient/caregiver will verbalize/demonstrate measures taken to prevent injury and/or falls Date Initiated: 01/09/2023 Target Resolution Date: 03/06/2023 Goal Status:  Active Interventions: Assess fall risk on admission and as needed Assess: immobility, friction, shearing, incontinence upon admission and as needed Assess impairment of mobility on admission and as needed per policy Notes: Pressure Nursing Diagnoses: Knowledge deficit related to causes and risk factors for pressure ulcer development Knowledge deficit related to management of pressures ulcers Potential for impaired tissue integrity related to pressure, friction, moisture, and shear Goals: Patient/caregiver will verbalize understanding of pressure ulcer management Date Initiated: 01/09/2023 Target Resolution Date: 03/06/2023 Goal Status: Active Interventions: Assess: immobility, friction, shearing, incontinence upon admission and as needed Assess offloading mechanisms upon admission and as needed Assess potential for pressure ulcer upon admission and as needed Notes: Wound/Skin Impairment Nursing Diagnoses: Impaired tissue integrity Knowledge deficit related to ulceration/compromised skin integrity Goals: Patient/caregiver will verbalize understanding of skin care regimen Date Initiated: 01/09/2023 Target Resolution Date: 03/06/2023 Goal Status: Active Ulcer/skin breakdown will have a volume reduction of 30% by week 4 Date  Initiated: 01/09/2023 Date Inactivated: 02/07/2023 Target Resolution Date: 02/06/2023 Goal Status: Met Ulcer/skin breakdown will have a volume reduction of 50% by week 8 Date Initiated: 02/07/2023 Target Resolution Date: 03/06/2023 Goal Status: Active Interventions: Assess patient/caregiver ability to obtain necessary supplies Assess patient/caregiver ability to perform ulcer/skin care regimen upon admission and as needed Assess ulceration(s) every visit Provide education on ulcer and skin care Treatment Activities: Skin care regimen initiated : 01/09/2023 Topical wound management initiated : 01/09/2023 Notes: Electronic Signature(s) [name], [name] ([id number])  [id number] Page 5 of 7 Signed: 03/01/2023 4:47:14 PM By: Zenaida Deed RN, BSN Entered By: Zenaida Deed on 03/01/2023 10:47:34 -------------------------------------------------------------------------------- Pain Assessment Details Patient Name: Date of Service: [facility] Neil Coons M M. 03/01/2023 11:15 A M Medical Record Number: [id number] Patient Account Number: [id number] Date of Birth/Sex: Treating RN: 01-Nov-1945 (77 y.o. [name] Primary Care Prabhjot Maddux: Vilinda Boehringer, DHA NA [facility] Other Clinician: Referring Therin Vetsch: Treating Mersades Barbaro/Extender: Estrella Myrtle SHI, DHA NA [facility] Weeks in Treatment: 7 Active Problems Location of Pain Severity and Description of Pain Patient Has Paino No Site Locations Rate the pain. Current Pain Level: 0 Pain Management and Medication Current Pain Management: Electronic Signature(s) Signed: 03/01/2023 4:47:14 PM By: Zenaida Deed RN, BSN Entered By: Zenaida Deed on 03/01/2023 11:27:46 -------------------------------------------------------------------------------- Patient/Caregiver Education Details Patient Name: Date of Service: Neil Culver. 8/28/2024andnbsp11:15 A M Medical Record Number: [id number] Patient Account Number: [id number] Date of Birth/Gender: Treating RN: 02/14/1946 (77 y.o. [name] Primary Care Physician: Vilinda Boehringer, [facility] NA [facility] Other Clinician: Referring Physician: Treating Physician/Extender: [name], DHA NA [facility] Weeks in Treatment: 7 Education Assessment Education Provided To: Patient [name], [name] ([id number]) [id number] Page 6 of 7 Education Topics Provided Pressure: Methods: Explain/Verbal Responses: Reinforcements needed, State content correctly Wound/Skin Impairment: Methods: Explain/Verbal Responses: Reinforcements needed, State content correctly Electronic Signature(s) Signed: 03/01/2023 4:47:14 PM  By: Zenaida Deed RN, BSN Entered By: Zenaida Deed on 03/01/2023 10:48:02 -------------------------------------------------------------------------------- Wound Assessment Details Patient Name: Date of Service: Neil Pearson M M. 03/01/2023 11:15 A M Medical Record Number: [id number] Patient Account Number: [id number] Date of Birth/Sex: Treating RN: 1945-08-30 (77 y.o. [name] Primary Care Macen Joslin: Vilinda Boehringer, DHA NA [facility] Other Clinician: Referring Nickia Boesen: Treating Margery Szostak/Extender: Estrella Myrtle SHI, DHA NA [facility] Weeks in Treatment: 7 Wound Status Wound Number: 2 Primary Etiology: Pressure Ulcer Wound Location: Left, Posterior Lower Leg Wound Status: Open Wounding Event: Pressure Injury Comorbid History: Cataracts, Sleep Apnea, Hypertension, Osteoarthritis Date Acquired: 12/03/2022 Weeks Of Treatment: 7 Clustered Wound: No Photos Wound Measurements Length: (cm) 1.4 Width: (cm) 0.8 Depth: (cm) 0.1 Area: (cm) 0.88 Volume: (cm) 0.088 % Reduction in Area: 25.3% % Reduction in Volume: 25.4% Epithelialization: Small (1-33%) Tunneling: No Undermining: No Wound Description Classification: Category/Stage III Wound Margin: Distinct, outline attached Exudate Amount: Small Exudate Type: Serosanguineous Exudate Color: red, brown Foul Odor After Cleansing: No Slough/Fibrino Yes Wound Bed Granulation Amount: Medium (34-66%) Exposed Structure Granulation Quality: Pink Fascia Exposed: No Necrotic Amount: Medium (34-66%) Fat Layer (Subcutaneous Tissue) Exposed: Yes Necrotic Quality: Adherent Slough Tendon Exposed: No Muscle Exposed: No [name], [name] ([id number]) [id number] Page 7 of 7 Joint Exposed: No Bone Exposed: No Periwound Skin Texture Texture Color No Abnormalities Noted: Yes No Abnormalities Noted: Yes Moisture Temperature / Pain No Abnormalities Noted: Yes Temperature: No Abnormality Treatment  Notes Wound #2 (Lower Leg) Wound Laterality: Left, Posterior Cleanser Peri-Wound Care Topical Primary Dressing Hydrofera Blue Ready Transfer Foam, 2.5x2.5 (in/in) Discharge  Instruction: Apply directly to wound bed as directed Santyl Ointment Discharge Instruction: Apply nickel thick amount to wound bed as instructed Secondary Dressing Woven Gauze Sponge, Non-Sterile 4x4 in Discharge Instruction: Apply over primary dressing as directed. Secured With Elastic Bandage 4 inch (ACE bandage) Discharge Instruction: Secure with ACE bandage as directed. Kerlix Roll Sterile, 4.5x3.1 (in/yd) Discharge Instruction: Secure with Kerlix as directed. Compression Wrap Compression Stockings Add-Ons Electronic Signature(s) Signed: 03/01/2023 4:47:14 PM By: Zenaida Deed RN, BSN Entered By: Zenaida Deed on 03/01/2023 11:39:08 -------------------------------------------------------------------------------- Vitals Details Patient Name: Date of Service: Neil Pearson M M. 03/01/2023 11:15 A M Medical Record Number: [id number] Patient Account Number: [id number] Date of Birth/Sex: Treating RN: 07/29/1945 (76 y.o. [name] Primary Care Kiearra Oyervides: Vilinda Boehringer, DHA NA [facility] Other Clinician: Referring Gennifer Potenza: Treating Voula Waln/Extender: Estrella Myrtle SHI, DHA NA [facility] Weeks in Treatment: 7 Vital Signs Time Taken: 11:25 Temperature (F): 98.4 Height (in): 72 Pulse (bpm): 61 Weight (lbs): 240 Respiratory Rate (breaths/min): 18 Body Mass Index (BMI): 32.5 Blood Pressure (mmHg): 151/71 Reference Range: 80 - 120 mg / dl Electronic Signature(s) Signed: 03/01/2023 4:47:14 PM By: Zenaida Deed RN, BSN Entered By: Zenaida Deed on 03/01/2023 11:27:39

## 2023-03-07 ENCOUNTER — Encounter (HOSPITAL_BASED_OUTPATIENT_CLINIC_OR_DEPARTMENT_OTHER): Payer: Medicare Other | Attending: General Surgery | Admitting: General Surgery

## 2023-03-07 DIAGNOSIS — G4730 Sleep apnea, unspecified: Secondary | ICD-10-CM | POA: Diagnosis not present

## 2023-03-07 DIAGNOSIS — I10 Essential (primary) hypertension: Secondary | ICD-10-CM | POA: Diagnosis not present

## 2023-03-07 DIAGNOSIS — L89523 Pressure ulcer of left ankle, stage 3: Secondary | ICD-10-CM | POA: Diagnosis present

## 2023-03-07 DIAGNOSIS — G629 Polyneuropathy, unspecified: Secondary | ICD-10-CM | POA: Diagnosis not present

## 2023-03-07 DIAGNOSIS — Z9049 Acquired absence of other specified parts of digestive tract: Secondary | ICD-10-CM | POA: Diagnosis not present

## 2023-03-07 DIAGNOSIS — Z8249 Family history of ischemic heart disease and other diseases of the circulatory system: Secondary | ICD-10-CM | POA: Diagnosis not present

## 2023-03-07 DIAGNOSIS — Z96653 Presence of artificial knee joint, bilateral: Secondary | ICD-10-CM | POA: Diagnosis not present

## 2023-03-07 NOTE — Progress Notes (Signed)
[name] ([id number]) [id number] Page 1 of 9 Visit Report for 03/07/2023 Chief Complaint Document Details Patient Name: Date of Service: [facility] [name]. 03/07/2023 1:15 PM Medical Record Number: [id number] Patient Account Number: [id number] Date of Birth/Sex: Treating RN: April 18, 1946 (77 y.o. M) Primary Care Provider: Vilinda Boehringer, DHA NA [facility] Other Clinician: Referring Provider: Treating Provider/Extender: Estrella Myrtle SHI, DHA NA [facility] Weeks in Treatment: 8 Information Obtained from: Patient Chief Complaint Patient presents to the wound care center with open non-healing surgical wound(s) and pressure ulcers Electronic Signature(s) Signed: 03/07/2023 1:45:22 PM By: Duanne Guess MD FACS Entered By: Duanne Guess on 03/07/2023 10:45:21 -------------------------------------------------------------------------------- Debridement Details Patient Name: Date of Service: Neil Pearson M M. 03/07/2023 1:15 PM Medical Record Number: [id number] Patient Account Number: [id number] Date of Birth/Sex: Treating RN: 11/13/1945 (77 y.o. [name] Primary Care Provider: Vilinda Boehringer, DHA NA [facility] Other Clinician: Referring Provider: Treating Provider/Extender: Estrella Myrtle SHI, DHA NA [facility] Weeks in Treatment: 8 Debridement Performed for Assessment: Wound #2 Left,Posterior Lower Leg Performed By: Physician Duanne Guess, MD Debridement Type: Debridement Level of Consciousness (Pre-procedure): Awake and Alert Pre-procedure Verification/Time Out Yes - 13:40 Taken: Start Time: 13:40 Pain Control: Lidocaine 4% Topical Solution Percent of Wound Bed Debrided: 150% T Area Debrided (cm): otal 0.53 Tissue and other material debrided: Non-Viable, Eschar, Slough, Slough Level: Non-Viable Tissue Debridement Description: Selective/Open Wound Instrument: Curette Bleeding: Minimum Hemostasis Achieved: Pressure Procedural Pain: 0 Post  Procedural Pain: 0 Response to Treatment: Procedure was tolerated well Level of Consciousness (Post- Awake and Alert procedure): Post Debridement Measurements of Total Wound Length: (cm) 0.9 Stage: Category/Stage III Width: (cm) 0.5 Depth: (cm) 0.1 Volume: (cm) 0.035 Character of Wound/Ulcer Post Debridement: Improved Post Procedure Diagnosis GEREMIAH, [name] ([id number]) [id number] Page 2 of 9 Same as Pre-procedure Notes Scribed for Dr. Lady Gary by Zenaida Deed, RN Electronic Signature(s) Signed: 03/07/2023 3:32:48 PM By: Duanne Guess MD FACS Signed: 03/07/2023 5:02:17 PM By: Zenaida Deed RN, BSN Entered By: Zenaida Deed on 03/07/2023 10:43:52 -------------------------------------------------------------------------------- HPI Details Patient Name: Date of Service: Neil Rubenstein M. 03/07/2023 1:15 PM Medical Record Number: [id number] Patient Account Number: [id number] Date of Birth/Sex: Treating RN: [birth year] (77 y.o. M) Primary Care Provider: Vilinda Boehringer, DHA NA [facility] Other Clinician: Referring Provider: Treating Provider/Extender: Estrella Myrtle SHI, DHA NA [facility] Weeks in Treatment: 8 History of Present Illness HPI Description: ADMISSION 01/09/2023 This is a 77 year old known diabetic who underwent a left ankle ORIF on Nov 26, 2022. He was seen by orthopedic surgery for follow-up on December 26, 2022 and was noted to have breakdown of his left lateral leg incision. He was also given a course of Keflex at that time. He also developed pressure ulcers on his posterior Achilles area, his lateral calcaneus, and his medial malleolus from the boot he was wearing. He has a pressure ulcer on his right heel, as well. He was referred to the wound care center for further evaluation and management. 01/17/2023: All of the wounds are little bit smaller today with the exception of the one over his posterior Achilles on the left. This wound is bigger and  has a fibrous layer to it that is densely adherent. There is slough accumulation on all of the wounds. He says he has not been using the Prevalon boot because he frequently has to get up and urinate but he does report that he is floating his heels on pillows. 01/27/2023: The surgical incision site is  healed. His medial ankle wound is down to just a couple of millimeters. His lateral left heel wound is also quite a bit smaller. There is minimal slough accumulation on this wound. The Achilles area on the left is still about the same size with thick, fibrotic, adherent non-viable tissue. The wound on his right heel is smaller. There is still some depth at the center with slough accumulation. 01/31/2023: The medial ankle wound has some eschar on the surface, underneath which it is about a millimeter in each dimension. The left lateral heel wound is down to a couple of millimeters with just some slough on the surface. The right calcaneal wound is also smaller with good epithelialization coming in. The Achilles area wound also continues to contract and the adherent nonviable tissue is much softer today. 02/07/2023: The left medial ankle wound is closed. The right calcaneal wound is very superficial and clean without any slough accumulation. The left lateral heel wound is also quite a bit smaller and appears similar to the right heel wound. The posterior ankle wound over the Achilles is contracting and there is a band of skin that has almost divided this wound in two. The rubbery slough is still present but soft. 02/21/2023: The heel wounds have both closed. The wound on his posterior ankle at the Achilles is smaller and the band of skin has divided it into 2 separate areas. There is slough accumulation on the wound surface. No concern for infection. 03/01/2023: The wound on his posterior ankle over the Achilles is about half the size as it was last week. The entire top portion has epithelialized. There is still  a layer of rubbery slough that has accumulated. No concern for infection. 03/07/2023: The wound continues to contract at a considerable rate. There is some eschar and light slough on the remaining open surface. Electronic Signature(s) Signed: 03/07/2023 1:47:24 PM By: Duanne Guess MD FACS Entered By: Duanne Guess on 03/07/2023 10:47:24 -------------------------------------------------------------------------------- Physical Exam Details Patient Name: Date of Service: [name]. 03/07/2023 1:15 PM Medical Record Number: [id number] Patient Account Number: [id number] Date of Birth/Sex: Treating RN: 1946-01-19 (77 y.o. M) Primary Care Provider: [facility], DHA NA [facility] Other Clinician: AADHI, SMYSER ([id number]) [id number] Page 3 of 9 Referring Provider: Treating Provider/Extender: Estrella Myrtle SHI, DHA NA [facility] Weeks in Treatment: 8 Constitutional . . . . no acute distress. Respiratory Normal work of breathing on room air. Notes 03/07/2023: The wound continues to contract at a considerable rate. There is some eschar and light slough on the remaining open surface. Electronic Signature(s) Signed: 03/07/2023 1:48:25 PM By: Duanne Guess MD FACS Entered By: Duanne Guess on 03/07/2023 10:48:24 -------------------------------------------------------------------------------- Physician Orders Details Patient Name: Date of Service: Neil Pearson M M. 03/07/2023 1:15 PM Medical Record Number: [id number] Patient Account Number: [id number] Date of Birth/Sex: Treating RN: 07/22/1945 (78 y.o. [name] Primary Care Provider: Vilinda Boehringer, DHA NA [facility] Other Clinician: Referring Provider: Treating Provider/Extender: Estrella Myrtle SHI, DHA NA [facility] Weeks in Treatment: 8 Verbal / Phone Orders: No Diagnosis Coding ICD-10 Coding Code Description [phone] Pressure ulcer of left ankle, stage 3 G62.9 Polyneuropathy,  unspecified Follow-up Appointments ppointment in 1 week. - Dr. Lady Gary RM 1 Return A Monday 9/9 @ 11:15 am Anesthetic Wound #2 Left,Posterior Lower Leg (In clinic) Topical Lidocaine 4% applied to wound bed Bathing/ Shower/ Hygiene May shower and wash wound with soap and water. Off-Loading DH Walker Boot to: - left foot per ortho  Prevalon Boot - Prevalon boot to right foot especially while in bed Other: - Avoid pressure on the posterior (back ) of left lower leg Home Health No change in wound care orders this week; continue Home Health for wound care. May utilize formulary equivalent dressing for wound treatment orders unless otherwise specified. Dressing changes to be completed by Home Health on Monday / Wednesday / Friday except when patient has scheduled visit at Wound Care Center. - [facility] to change dressing 3 times next week Other Home Health Orders/Instructions: Verna Czech fax- [phone] Wound Treatment Wound #2 - Lower Leg Wound Laterality: Left, Posterior Prim Dressing: Hydrofera Blue Ready Transfer Foam, 2.5x2.5 (in/in) 3 x Per Week/30 Days ary Discharge Instructions: Apply directly to wound bed as directed Prim Dressing: Santyl Ointment 3 x Per Week/30 Days ary Discharge Instructions: Apply nickel thick amount to wound bed as instructed Secondary Dressing: ALLEVYN Gentle Border, 3x3 (in/in) 3 x Per Week/30 Days Discharge Instructions: Apply over primary dressing as directed. SON, BONESTEEL ([id number]) [id number] Page 4 of 9 Secured With: Elastic Bandage 4 inch (ACE bandage) 3 x Per Week/30 Days Discharge Instructions: Secure with ACE bandage to hold splint in place Electronic Signature(s) Signed: 03/07/2023 3:32:48 PM By: Duanne Guess MD FACS Signed: 03/07/2023 5:02:17 PM By: Zenaida Deed RN, BSN Entered By: Zenaida Deed on 03/07/2023  10:56:32 -------------------------------------------------------------------------------- Problem List Details Patient Name: Date of Service: Neil Pearson M M. 03/07/2023 1:15 PM Medical Record Number: [id number] Patient Account Number: [id number] Date of Birth/Sex: Treating RN: 03-24-1946 (77 y.o. [name] Primary Care Provider: Vilinda Boehringer, DHA NA [facility] Other Clinician: Referring Provider: Treating Provider/Extender: Estrella Myrtle SHI, DHA NA [facility] Weeks in Treatment: 8 Active Problems ICD-10 Encounter Code Description Active Date MDM Diagnosis L89.523 Pressure ulcer of left ankle, stage 3 01/09/2023 No Yes G62.9 Polyneuropathy, unspecified 01/09/2023 No Yes Inactive Problems Resolved Problems ICD-10 Code Description Active Date Resolved Date L97.322 Non-pressure chronic ulcer of left ankle with fat layer exposed 01/09/2023 01/09/2023 L89.623 Pressure ulcer of left heel, stage 3 01/09/2023 01/09/2023 L89.613 Pressure ulcer of right heel, stage 3 01/09/2023 01/09/2023 Electronic Signature(s) Signed: 03/07/2023 1:44:39 PM By: Duanne Guess MD FACS Entered By: Duanne Guess on 03/07/2023 10:44:39 -------------------------------------------------------------------------------- Progress Note Details Patient Name: Date of Service: Orlinda Blalock MSO Rockne Coons M M. 03/07/2023 1:15 PM Elenore Paddy ([id number]) [id number] Page 5 of 9 Medical Record Number: [id number] Patient Account Number: [id number] Date of Birth/Sex: Treating RN: 1945/10/08 (77 y.o. M) Primary Care Provider: Vilinda Boehringer, DHA NA [facility] Other Clinician: Referring Provider: Treating Provider/Extender: Estrella Myrtle SHI, DHA NA [facility] Weeks in Treatment: 8 Subjective Chief Complaint Information obtained from Patient Patient presents to the wound care center with open non-healing surgical wound(s) and pressure ulcers History of Present Illness (HPI) ADMISSION 01/09/2023 This is a  77 year old known diabetic who underwent a left ankle ORIF on Nov 26, 2022. He was seen by orthopedic surgery for follow-up on December 26, 2022 and was noted to have breakdown of his left lateral leg incision. He was also given a course of Keflex at that time. He also developed pressure ulcers on his posterior Achilles area, his lateral calcaneus, and his medial malleolus from the boot he was wearing. He has a pressure ulcer on his right heel, as well. He was referred to the wound care center for further evaluation and management. 01/17/2023: All of the wounds are little bit smaller today with the exception of the one over his posterior  Achilles on the left. This wound is bigger and has a fibrous layer to it that is densely adherent. There is slough accumulation on all of the wounds. He says he has not been using the Prevalon boot because he frequently has to get up and urinate but he does report that he is floating his heels on pillows. 01/27/2023: The surgical incision site is healed. His medial ankle wound is down to just a couple of millimeters. His lateral left heel wound is also quite a bit smaller. There is minimal slough accumulation on this wound. The Achilles area on the left is still about the same size with thick, fibrotic, adherent non-viable tissue. The wound on his right heel is smaller. There is still some depth at the center with slough accumulation. 01/31/2023: The medial ankle wound has some eschar on the surface, underneath which it is about a millimeter in each dimension. The left lateral heel wound is down to a couple of millimeters with just some slough on the surface. The right calcaneal wound is also smaller with good epithelialization coming in. The Achilles area wound also continues to contract and the adherent nonviable tissue is much softer today. 02/07/2023: The left medial ankle wound is closed. The right calcaneal wound is very superficial and clean without any slough  accumulation. The left lateral heel wound is also quite a bit smaller and appears similar to the right heel wound. The posterior ankle wound over the Achilles is contracting and there is a band of skin that has almost divided this wound in two. The rubbery slough is still present but soft. 02/21/2023: The heel wounds have both closed. The wound on his posterior ankle at the Achilles is smaller and the band of skin has divided it into 2 separate areas. There is slough accumulation on the wound surface. No concern for infection. 03/01/2023: The wound on his posterior ankle over the Achilles is about half the size as it was last week. The entire top portion has epithelialized. There is still a layer of rubbery slough that has accumulated. No concern for infection. 03/07/2023: The wound continues to contract at a considerable rate. There is some eschar and light slough on the remaining open surface. Patient History Information obtained from Patient, Caregiver, Chart. Family History Heart Disease - Mother,Father, Hypertension - Mother,Father, Stroke - Father, No family history of Cancer, Diabetes, Hereditary Spherocytosis, Kidney Disease, Lung Disease, Seizures, Thyroid Problems, Tuberculosis. Social History Never smoker, Marital Status - Married, Alcohol Use - Rarely, Drug Use - No History, Caffeine Use - Daily - coffee. Medical History Eyes Patient has history of Cataracts - bil extractions Denies history of Glaucoma, Optic Neuritis Ear/Nose/Mouth/Throat Denies history of Chronic sinus problems/congestion, Middle ear problems Respiratory Patient has history of Sleep Apnea Cardiovascular Patient has history of Hypertension Endocrine Denies history of Type I Diabetes, Type II Diabetes Genitourinary Denies history of End Stage Renal Disease Immunological Denies history of Lupus Erythematosus, Raynauds, Scleroderma Integumentary (Skin) Denies history of History of Burn Musculoskeletal Patient  has history of Osteoarthritis Denies history of Gout, Rheumatoid Arthritis, Osteomyelitis Neurologic Denies history of Dementia, Neuropathy, Quadriplegia, Paraplegia, Seizure Disorder Oncologic Denies history of Received Chemotherapy, Received Radiation Psychiatric Denies history of Anorexia/bulimia, Confinement Anxiety Hospitalization/Surgery History - bil knee replacements. - ORIF left ankel 5/23 2024. - cholecystectomy. - cervical fusion. Medical A Surgical History Notes nd Respiratory hx aspiration PNA, asbestosis Gastrointestinal PRYOR, REVILLA ([id number]) [id number] Page 6 of 9 duodenitis, constipation Musculoskeletal ankylosing spondylitis, compression fx T10 Objective  Constitutional no acute distress. Vitals Time Taken: 1:19 AM, Height: 72 in, Weight: 240 lbs, BMI: 32.5, Temperature: 97.8 F, Pulse: 61 bpm, Respiratory Rate: 18 breaths/min, Blood Pressure: 125/64 mmHg. Respiratory Normal work of breathing on room air. General Notes: 03/07/2023: The wound continues to contract at a considerable rate. There is some eschar and light slough on the remaining open surface. Integumentary (Hair, Skin) Wound #2 status is Open. Original cause of wound was Pressure Injury. The date acquired was: 12/03/2022. The wound has been in treatment 8 weeks. The wound is located on the Left,Posterior Lower Leg. The wound measures 0.9cm length x 0.5cm width x 0.1cm depth; 0.353cm^2 area and 0.035cm^3 volume. There is Fat Layer (Subcutaneous Tissue) exposed. There is no tunneling or undermining noted. There is a small amount of serosanguineous drainage noted. The wound margin is distinct with the outline attached to the wound base. There is large (67-100%) red, pink granulation within the wound bed. There is a small (1-33%) amount of necrotic tissue within the wound bed including Adherent Slough. The periwound skin appearance had no abnormalities noted for texture.  The periwound skin appearance had no abnormalities noted for moisture. The periwound skin appearance had no abnormalities noted for color. Periwound temperature was noted as No Abnormality. Assessment Active Problems ICD-10 Pressure ulcer of left ankle, stage 3 Polyneuropathy, unspecified Procedures Wound #2 Pre-procedure diagnosis of Wound #2 is a Pressure Ulcer located on the Left,Posterior Lower Leg . There was a Selective/Open Wound Non-Viable Tissue Debridement with a total area of 0.53 sq cm performed by Duanne Guess, MD. With the following instrument(s): Curette to remove Non-Viable tissue/material. Material removed includes Eschar and Slough and after achieving pain control using Lidocaine 4% T opical Solution. No specimens were taken. A time out was conducted at 13:40, prior to the start of the procedure. A Minimum amount of bleeding was controlled with Pressure. The procedure was tolerated well with a pain level of 0 throughout and a pain level of 0 following the procedure. Post Debridement Measurements: 0.9cm length x 0.5cm width x 0.1cm depth; 0.035cm^3 volume. Post debridement Stage noted as Category/Stage III. Character of Wound/Ulcer Post Debridement is improved. Post procedure Diagnosis Wound #2: Same as Pre-Procedure General Notes: Scribed for Dr. Lady Gary by Zenaida Deed, RN. Plan Follow-up Appointments: Return Appointment in 1 week. - Dr. Lady Gary RM 1 Monday 9/9 @ 11:15 am Anesthetic: Wound #2 Left,Posterior Lower Leg: (In clinic) Topical Lidocaine 4% applied to wound bed Bathing/ Shower/ Hygiene: May shower and wash wound with soap and water. Off-Loading: DH Walker Boot to: - left foot per ortho Prevalon Boot - Prevalon boot to right foot especially while in bed Other: - Avoid pressure on the posterior (back ) of left lower leg Home Health: No change in wound care orders this week; continue Home Health for wound care. May utilize formulary equivalent dressing  for wound treatment orders unless otherwise specified. EZRAH, KARNEY ([id number]) [id number] Page 7 of 9 Dressing changes to be completed by Home Health on Monday / Wednesday / Friday except when patient has scheduled visit at Wound Care Center. - [facility] to change dressing 3 times next week Other Home Health Orders/Instructions: Verna Czech fax- [phone] WOUND #2: - Lower Leg Wound Laterality: Left, Posterior Prim Dressing: Hydrofera Blue Ready Transfer Foam, 2.5x2.5 (in/in) 3 x Per Week/30 Days ary Discharge Instructions: Apply directly to wound bed as directed Prim Dressing: Santyl Ointment 3 x Per Week/30 Days ary Discharge Instructions: Apply nickel thick amount to  wound bed as instructed Secondary Dressing: ALLEVYN Gentle Border, 3x3 (in/in) 3 x Per Week/30 Days Discharge Instructions: Apply over primary dressing as directed. Secured With: Elastic Bandage 4 inch (ACE bandage) 3 x Per Week/30 Days Discharge Instructions: Secure with ACE bandage as directed. Secured With: [employer], 4.5x3.1 (in/yd) 3 x Per Week/30 Days Discharge Instructions: Secure with Kerlix as directed. 03/07/2023: The wound continues to contract at a considerable rate. There is some eschar and light slough on the remaining open surface. I used a curette to debride slough and eschar from the wound. I think the wound would benefit from 1 more week of enzymatic debridement with Santyl. We will continue to use [facility], as well. Hopefully next week we can make the switch to collagen. Follow-up in 1 week's time. Electronic Signature(s) Signed: 03/07/2023 1:51:47 PM By: Duanne Guess MD FACS Entered By: Duanne Guess on 03/07/2023 10:51:47 -------------------------------------------------------------------------------- HxROS Details Patient Name: Date of Service: Neil Rubenstein M. 03/07/2023 1:15 PM Medical Record Number: [id number] Patient  Account Number: [id number] Date of Birth/Sex: Treating RN: 1946-07-04 (77 y.o. M) Primary Care Provider: Vilinda Boehringer, DHA NA [facility] Other Clinician: Referring Provider: Treating Provider/Extender: Estrella Myrtle SHI, DHA NA [facility] Weeks in Treatment: 8 Information Obtained From Patient Caregiver Chart Eyes Medical History: Positive for: Cataracts - bil extractions Negative for: Glaucoma; Optic Neuritis Ear/Nose/Mouth/Throat Medical History: Negative for: Chronic sinus problems/congestion; Middle ear problems Respiratory Medical History: Positive for: Sleep Apnea Past Medical History Notes: hx aspiration PNA, asbestosis Cardiovascular Medical History: Positive for: Hypertension Gastrointestinal Medical History: Past Medical History Notes: duodenitis, constipation Endocrine DASANI, SPEICHER ([id number]) [id number] Page 8 of 9 Medical History: Negative for: Type I Diabetes; Type II Diabetes Genitourinary Medical History: Negative for: End Stage Renal Disease Immunological Medical History: Negative for: Lupus Erythematosus; Raynauds; Scleroderma Integumentary (Skin) Medical History: Negative for: History of Burn Musculoskeletal Medical History: Positive for: Osteoarthritis Negative for: Gout; Rheumatoid Arthritis; Osteomyelitis Past Medical History Notes: ankylosing spondylitis, compression fx T10 Neurologic Medical History: Negative for: Dementia; Neuropathy; Quadriplegia; Paraplegia; Seizure Disorder Oncologic Medical History: Negative for: Received Chemotherapy; Received Radiation Psychiatric Medical History: Negative for: Anorexia/bulimia; Confinement Anxiety HBO Extended History Items Eyes: Cataracts Immunizations Pneumococcal Vaccine: Received Pneumococcal Vaccination: Yes Received Pneumococcal Vaccination On or After 60th Birthday: Yes Implantable Devices Yes Hospitalization / Surgery History Type of  Hospitalization/Surgery bil knee replacements ORIF left ankel 5/23 2024 cholecystectomy cervical fusion Family and Social History Cancer: No; Diabetes: No; Heart Disease: Yes - Mother,Father; Hereditary Spherocytosis: No; Hypertension: Yes - Mother,Father; Kidney Disease: No; Lung Disease: No; Seizures: No; Stroke: Yes - Father; Thyroid Problems: No; Tuberculosis: No; Never smoker; Marital Status - Married; Alcohol Use: Rarely; Drug Use: No History; Caffeine Use: Daily - coffee; Financial Concerns: No; Food, Clothing or Shelter Needs: No; Support System Lacking: No; Transportation Concerns: No Electronic Signature(s) Signed: 03/07/2023 3:32:48 PM By: Duanne Guess MD FACS Entered By: Duanne Guess on 03/07/2023 10:48:01 Elenore Paddy ([id number]) [id number] Page 9 of 9 -------------------------------------------------------------------------------- SuperBill Details Patient Name: Date of Service: [facility] [name]. 03/07/2023 Medical Record Number: [id number] Patient Account Number: [id number] Date of Birth/Sex: Treating RN: [birth year] (76 y.o. M) Primary Care Provider: Vilinda Boehringer, DHA NA [facility] Other Clinician: Referring Provider: Treating Provider/Extender: Estrella Myrtle SHI, DHA NA [facility] Weeks in Treatment: 8 Diagnosis Coding ICD-10 Codes Code Description [phone] Pressure ulcer of left ankle, stage 3 G62.9 Polyneuropathy, unspecified Facility Procedures : CPT4 Code: [id number] Description: 02725 - DEBRIDE WOUND 1ST  20 SQ CM OR < ICD-10 Diagnosis Description L89.523 Pressure ulcer of left ankle, stage 3 Modifier: Quantity: 1 Physician Procedures : CPT4 Code Description Modifier [id number] - WC PHYS LEVEL 4 - EST PT 25 ICD-10 Diagnosis Description L89.523 Pressure ulcer of left ankle, stage 3 G62.9 Polyneuropathy, unspecified Quantity: 1 : [id number] - WC PHYS DEBR WO ANESTH 20 SQ CM ICD-10 Diagnosis Description L89.523 Pressure  ulcer of left ankle, stage 3 Quantity: 1 Electronic Signature(s) Signed: 03/07/2023 1:52:12 PM By: Duanne Guess MD FACS Entered By: Duanne Guess on 03/07/2023 10:52:11

## 2023-03-07 NOTE — Progress Notes (Signed)
IVIE, KLEINKNECHT ([id number]) [id number] Page 1 of 7 Visit Report for 03/07/2023 Arrival Information Details Patient Name: Date of Service: [facility] [name]. 03/07/2023 1:15 PM Medical Record Number: [id number] Patient Account Number: [id number] Date of Birth/Sex: Treating RN: Nov 03, 1945 (77 y.o. M) Primary Care Demeka Sutter: Vilinda Boehringer, DHA NA [facility] Other Clinician: Referring [name]: Treating Javia Dillow/Extender: Estrella Myrtle SHI, DHA NA [facility] Weeks in Treatment: 8 Visit Information History Since Last Visit All ordered tests and consults were completed: No Patient Arrived: Wheel Chair Added or deleted any medications: No Arrival Time: 13:18 Any new allergies or adverse reactions: No Accompanied By: wife Had a fall or experienced change in No Transfer Assistance: None activities of daily living that may affect Patient Identification Verified: Yes risk of falls: Secondary Verification Process Completed: Yes Signs or symptoms of abuse/neglect since last visito No Patient Requires Transmission-Based Precautions: No Hospitalized since last visit: No Patient Has Alerts: No Implantable device outside of the clinic excluding No cellular tissue based products placed in the center since last visit: Has Dressing in Place as Prescribed: Yes Pain Present Now: No Electronic Signature(s) Signed: 03/07/2023 5:02:17 PM By: Zenaida Deed RN, BSN Entered By: Zenaida Deed on 03/07/2023 10:28:55 -------------------------------------------------------------------------------- Encounter Discharge Information Details Patient Name: Date of Service: [facility] [name] M M. 03/07/2023 1:15 PM Medical Record Number: [id number] Patient Account Number: [id number] Date of Birth/Sex: Treating RN: 1946-01-25 (77 y.o. [name] Primary Care Tarrah Furuta: Vilinda Boehringer, DHA NA [facility] Other Clinician: Referring Samary Shatz: Treating Enzley Kitchens/Extender: Estrella Myrtle SHI,  DHA NA [facility] Weeks in Treatment: 8 Encounter Discharge Information Items Post Procedure Vitals Discharge Condition: Stable Temperature (F): 97.8 Ambulatory Status: Wheelchair Pulse (bpm): 61 Discharge Destination: Home Respiratory Rate (breaths/min): 18 Transportation: Private Auto Blood Pressure (mmHg): 125/64 Accompanied By: spouse Schedule Follow-up Appointment: Yes Clinical Summary of Care: Patient Declined Electronic Signature(s) Signed: 03/07/2023 5:02:17 PM By: Zenaida Deed RN, BSN Entered By: Zenaida Deed on 03/07/2023 10:58:18 [name] ([id number]) [id number] Page 2 of 7 -------------------------------------------------------------------------------- Lower Extremity Assessment Details Patient Name: Date of Service: [facility] MSO [name]. 03/07/2023 1:15 PM Medical Record Number: [id number] Patient Account Number: [id number] Date of Birth/Sex: Treating RN: 14-Feb-1946 (77 y.o. [name] Primary Care Waylynn Benefiel: Vilinda Boehringer, DHA NA [facility] Other Clinician: Referring Trayshawn Durkin: Treating Dorina Ribaudo/Extender: Estrella Myrtle SHI, DHA NA [facility] Weeks in Treatment: 8 Edema Assessment Assessed: [Left: No] [Right: No] Edema: [Left: Ye] [Right: s] Calf Left: Right: Point of Measurement: From Medial Instep 38.5 cm Ankle Left: Right: Point of Measurement: From Medial Instep 28.1 cm Vascular Assessment Pulses: Dorsalis Pedis Palpable: [Left:Yes] Extremity colors, hair growth, and conditions: Extremity Color: [Left:Normal] Hair Growth on Extremity: [Left:Yes] Temperature of Extremity: [Left:Warm] Capillary Refill: [Left:< 3 seconds] Dependent Rubor: [Left:No No] Electronic Signature(s) Signed: 03/07/2023 5:02:17 PM By: Zenaida Deed RN, BSN Entered By: Zenaida Deed on 03/07/2023 10:30:58 -------------------------------------------------------------------------------- Multi Wound Chart Details Patient Name: Date of  Service: [name] M M. 03/07/2023 1:15 PM Medical Record Number: [id number] Patient Account Number: [id number] Date of Birth/Sex: Treating RN: 1945-12-27 (77 y.o. M) Primary Care Laisa Larrick: Vilinda Boehringer, DHA NA [facility] Other Clinician: Referring Tyna Huertas: Treating Rayford Williamsen/Extender: Estrella Myrtle SHI, DHA NA [facility] Weeks in Treatment: 8 Vital Signs Height(in): 72 Pulse(bpm): 61 Weight(lbs): 240 Blood Pressure(mmHg): 125/64 Body Mass Index(BMI): 32.5 Temperature(F): 97.8 Respiratory Rate(breaths/min): 18 [2:Photos:] [N/A:N/A] Left, Posterior Lower Leg N/A N/A Wound Location: Pressure Injury N/A N/A Wounding Event: Pressure Ulcer N/A N/A Primary Etiology:  Cataracts, Sleep Apnea, Hypertension, N/A N/A Comorbid History: Osteoarthritis 12/03/2022 N/A N/A Date Acquired: 8 N/A N/A Weeks of Treatment: Open N/A N/A Wound Status: No N/A N/A Wound Recurrence: 0.9x0.5x0.1 N/A N/A Measurements L x W x D (cm) 0.353 N/A N/A A (cm) : rea 0.035 N/A N/A Volume (cm) : 70.00% N/A N/A % Reduction in A rea: 70.30% N/A N/A % Reduction in Volume: Category/Stage III N/A N/A Classification: Small N/A N/A Exudate A mount: Serosanguineous N/A N/A Exudate Type: red, brown N/A N/A Exudate Color: Distinct, outline attached N/A N/A Wound Margin: Large (67-100%) N/A N/A Granulation A mount: Red, Pink N/A N/A Granulation Quality: Small (1-33%) N/A N/A Necrotic A mount: Fat Layer (Subcutaneous Tissue): Yes N/A N/A Exposed Structures: Fascia: No Tendon: No Muscle: No Joint: No Bone: No Small (1-33%) N/A N/A Epithelialization: Debridement - Selective/Open Wound N/A N/A Debridement: Pre-procedure Verification/Time Out 13:40 N/A N/A Taken: Lidocaine 4% Topical Solution N/A N/A Pain Control: Necrotic/Eschar, Slough N/A N/A Tissue Debrided: Non-Viable Tissue N/A N/A Level: 0.53 N/A N/A Debridement A (sq cm): rea Curette N/A N/A Instrument: Minimum N/A  N/A Bleeding: Pressure N/A N/A Hemostasis A chieved: 0 N/A N/A Procedural Pain: 0 N/A N/A Post Procedural Pain: Procedure was tolerated well N/A N/A Debridement Treatment Response: 0.9x0.5x0.1 N/A N/A Post Debridement Measurements L x W x D (cm) 0.035 N/A N/A Post Debridement Volume: (cm) Category/Stage III N/A N/A Post Debridement Stage: No Abnormalities Noted N/A N/A Periwound Skin Texture: No Abnormalities Noted N/A N/A Periwound Skin Moisture: No Abnormalities Noted N/A N/A Periwound Skin Color: No Abnormality N/A N/A Temperature: Debridement N/A N/A Procedures Performed: Treatment Notes Electronic Signature(s) Signed: 03/07/2023 1:44:47 PM By: Duanne Guess MD FACS Entered By: Duanne Guess on 03/07/2023 10:44:47 -------------------------------------------------------------------------------- Multi-Disciplinary Care Plan Details Patient Name: Date of Service: [facility] MSO [name] M M. 03/07/2023 1:15 PM Medical Record Number: [id number] Patient Account Number: [id number] Date of Birth/Sex: Treating RN: 08/14/1945 (77 y.o. [name] Primary Care Lynn Recendiz: Vilinda Boehringer, [facility] NA [facility] Other Clinician: Referring Wilman Tucker: Treating Marsena Taff/Extender: Jeni Salles, DHA NA [address] YGNACIO, RADEK [place] ([id number]) [id number] Page 4 of 7 Weeks in Treatment: 8 Multidisciplinary Care Plan reviewed with physician Active Inactive Abuse / Safety / Falls / Self Care Management Nursing Diagnoses: Potential for falls Goals: Patient/caregiver will verbalize/demonstrate measures taken to prevent injury and/or falls Date Initiated: 01/09/2023 Target Resolution Date: 04/03/2023 Goal Status: Active Interventions: Assess fall risk on admission and as needed Assess: immobility, friction, shearing, incontinence upon admission and as needed Assess impairment of mobility on admission and as needed per policy Notes: Pressure Nursing  Diagnoses: Knowledge deficit related to causes and risk factors for pressure ulcer development Knowledge deficit related to management of pressures ulcers Potential for impaired tissue integrity related to pressure, friction, moisture, and shear Goals: Patient/caregiver will verbalize understanding of pressure ulcer management Date Initiated: 01/09/2023 Target Resolution Date: 04/03/2023 Goal Status: Active Interventions: Assess: immobility, friction, shearing, incontinence upon admission and as needed Assess offloading mechanisms upon admission and as needed Assess potential for pressure ulcer upon admission and as needed Notes: Wound/Skin Impairment Nursing Diagnoses: Impaired tissue integrity Knowledge deficit related to ulceration/compromised skin integrity Goals: Patient/caregiver will verbalize understanding of skin care regimen Date Initiated: 01/09/2023 Target Resolution Date: 04/03/2023 Goal Status: Active Ulcer/skin breakdown will have a volume reduction of 30% by week 4 Date Initiated: 01/09/2023 Date Inactivated: 02/07/2023 Target Resolution Date: 02/06/2023 Goal Status: Met Ulcer/skin breakdown will have a volume reduction of 50% by week 8 Date Initiated:  02/07/2023 Date Inactivated: 03/07/2023 Target Resolution Date: 03/06/2023 Goal Status: Met Ulcer/skin breakdown will have a volume reduction of 80% by week 12 Date Initiated: 03/07/2023 Target Resolution Date: 04/03/2023 Goal Status: Active Interventions: Assess patient/caregiver ability to obtain necessary supplies Assess patient/caregiver ability to perform ulcer/skin care regimen upon admission and as needed Assess ulceration(s) every visit Provide education on ulcer and skin care Treatment Activities: Skin care regimen initiated : 01/09/2023 Topical wound management initiated : 01/09/2023 Notes: [name], [name] ([id number]) [id number] Page 5 of 7 Electronic Signature(s) Signed: 03/07/2023  5:02:17 PM By: Zenaida Deed RN, BSN Entered By: Zenaida Deed on 03/07/2023 10:34:24 -------------------------------------------------------------------------------- Pain Assessment Details Patient Name: Date of Service: [name] M M. 03/07/2023 1:15 PM Medical Record Number: [id number] Patient Account Number: [id number] Date of Birth/Sex: Treating RN: Jul 04, 1946 (77 y.o. M) Primary Care Bhumi Godbey: Vilinda Boehringer, DHA NA [facility] Other Clinician: Referring Everleigh Colclasure: Treating Bayla Mcgovern/Extender: Estrella Myrtle SHI, DHA NA [facility] Weeks in Treatment: 8 Active Problems Location of Pain Severity and Description of Pain Patient Has Paino No Site Locations Rate the pain. Current Pain Level: 0 Pain Management and Medication Current Pain Management: Electronic Signature(s) Signed: 03/07/2023 5:02:17 PM By: Zenaida Deed RN, BSN Entered By: Zenaida Deed on 03/07/2023 10:29:11 -------------------------------------------------------------------------------- Patient/Caregiver Education Details Patient Name: Date of Service: [name] 9/3/2024andnbsp1:15 PM Medical Record Number: [id number] Patient Account Number: [id number] Date of Birth/Gender: Treating RN: 12/08/1945 (77 y.o. [name] Primary Care Physician: Vilinda Boehringer, [facility] NA [facility] Other Clinician: Referring Physician: Treating Physician/Extender: Jeni Salles, DHA NA [facility] Weeks in Treatment: 8 Education Assessment Education Provided To: Patient [name], [name] ([id number]) [id number] Page 6 of 7 Education Topics Provided Pressure: Methods: Explain/Verbal Responses: Reinforcements needed, State content correctly Wound/Skin Impairment: Methods: Explain/Verbal Responses: Reinforcements needed, State content correctly Electronic Signature(s) Signed: 03/07/2023 5:02:17 PM By: Zenaida Deed RN, BSN Entered By: Zenaida Deed on 03/07/2023  10:35:17 -------------------------------------------------------------------------------- Wound Assessment Details Patient Name: Date of Service: [name] M M. 03/07/2023 1:15 PM Medical Record Number: [id number] Patient Account Number: [id number] Date of Birth/Sex: Treating RN: [birth year] (77 y.o. [name] Primary Care Porschea Borys: Vilinda Boehringer, DHA NA [facility] Other Clinician: Referring Krishan Mcbreen: Treating Dehaven Sine/Extender: Estrella Myrtle SHI, DHA NA [facility] Weeks in Treatment: 8 Wound Status Wound Number: 2 Primary Etiology: Pressure Ulcer Wound Location: Left, Posterior Lower Leg Wound Status: Open Wounding Event: Pressure Injury Comorbid History: Cataracts, Sleep Apnea, Hypertension, Osteoarthritis Date Acquired: 12/03/2022 Weeks Of Treatment: 8 Clustered Wound: No Photos Wound Measurements Length: (cm) 0.9 Width: (cm) 0.5 Depth: (cm) 0.1 Area: (cm) 0.353 Volume: (cm) 0.035 % Reduction in Area: 70% % Reduction in Volume: 70.3% Epithelialization: Small (1-33%) Tunneling: No Undermining: No Wound Description Classification: Category/Stage III Wound Margin: Distinct, outline attached Exudate Amount: Small Exudate Type: Serosanguineous Exudate Color: red, brown Foul Odor After Cleansing: No Slough/Fibrino Yes Wound Bed Granulation Amount: Large (67-100%) Exposed Structure Granulation Quality: Red, Pink Fascia Exposed: No Necrotic Amount: Small (1-33%) Fat Layer (Subcutaneous Tissue) Exposed: Yes Necrotic Quality: Adherent Slough Tendon Exposed: No [name], [name] ([id number]) [id number] Page 7 of 7 Muscle Exposed: No Joint Exposed: No Bone Exposed: No Periwound Skin Texture Texture Color No Abnormalities Noted: Yes No Abnormalities Noted: Yes Moisture Temperature / Pain No Abnormalities Noted: Yes Temperature: No Abnormality Treatment Notes Wound #2 (Lower Leg) Wound Laterality: Left,  Posterior Cleanser Peri-Wound Care Topical Primary Dressing Hydrofera Blue Ready Transfer Foam, 2.5x2.5 (in/in) Discharge Instruction: Apply directly to wound  bed as directed Santyl Ointment Discharge Instruction: Apply nickel thick amount to wound bed as instructed Secondary Dressing ALLEVYN Gentle Border, 3x3 (in/in) Discharge Instruction: Apply over primary dressing as directed. Secured With Elastic Bandage 4 inch (ACE bandage) Discharge Instruction: Secure with ACE bandage to hold splint in place Compression Wrap Compression Stockings Add-Ons Electronic Signature(s) Signed: 03/07/2023 5:02:17 PM By: Zenaida Deed RN, BSN Entered By: Zenaida Deed on 03/07/2023 10:32:12 -------------------------------------------------------------------------------- Vitals Details Patient Name: Date of Service: [name] M M. 03/07/2023 1:15 PM Medical Record Number: [id number] Patient Account Number: [id number] Date of Birth/Sex: Treating RN: 1945-09-11 (77 y.o. M) Primary Care Khalidah Herbold: Vilinda Boehringer, DHA NA [facility] Other Clinician: Referring Ralonda Tartt: Treating Nelani Schmelzle/Extender: Estrella Myrtle SHI, DHA NA [facility] Weeks in Treatment: 8 Vital Signs Time Taken: 01:19 Temperature (F): 97.8 Height (in): 72 Pulse (bpm): 61 Weight (lbs): 240 Respiratory Rate (breaths/min): 18 Body Mass Index (BMI): 32.5 Blood Pressure (mmHg): 125/64 Reference Range: 80 - 120 mg / dl Electronic Signature(s) Signed: 03/07/2023 5:02:17 PM By: Zenaida Deed RN, BSN Entered By: Zenaida Deed on 03/07/2023 10:29:02

## 2023-03-13 ENCOUNTER — Encounter (HOSPITAL_BASED_OUTPATIENT_CLINIC_OR_DEPARTMENT_OTHER): Payer: Medicare Other | Admitting: General Surgery

## 2023-03-13 DIAGNOSIS — L89523 Pressure ulcer of left ankle, stage 3: Secondary | ICD-10-CM | POA: Diagnosis not present

## 2023-03-13 NOTE — Progress Notes (Signed)
AMEL, DEBOIS ([id number]) [id number] Page 1 of 9 Visit Report for 03/13/2023 Chief Complaint Document Details Patient Name: Date of Service: [name]. 03/13/2023 11:15 A M Medical Record Number: [id number] Patient Account Number: [id number] Date of Birth/Sex: Treating RN: 12/23/1945 (77 y.o. M) Primary Care Provider: Vilinda Boehringer, DHA NA [facility] Other Clinician: Referring Provider: Treating Provider/Extender: Estrella Myrtle SHI, DHA NA [facility] Weeks in Treatment: 9 Information Obtained from: Patient Chief Complaint Patient presents to the wound care center with open non-healing surgical wound(s) and pressure ulcers Electronic Signature(s) Signed: 03/13/2023 12:15:28 PM By: Duanne Guess MD FACS Entered By: Duanne Guess on 03/13/2023 09:15:28 -------------------------------------------------------------------------------- Debridement Details Patient Name: Date of Service: [name] M M. 03/13/2023 11:15 A M Medical Record Number: [id number] Patient Account Number: [id number] Date of Birth/Sex: Treating RN: 1945/10/03 (77 y.o. [name] Primary Care Provider: Vilinda Boehringer, DHA NA [facility] Other Clinician: Referring Provider: Treating Provider/Extender: Estrella Myrtle SHI, DHA NA [facility] Weeks in Treatment: 9 Debridement Performed for Assessment: Wound #2 Left,Posterior Lower Leg Performed By: Physician Duanne Guess, MD Debridement Type: Debridement Level of Consciousness (Pre-procedure): Awake and Alert Pre-procedure Verification/Time Out Yes - 11:55 Taken: Start Time: 11:55 Pain Control: Lidocaine 4% T opical Solution Percent of Wound Bed Debrided: 100% T Area Debrided (cm): otal 0.22 Tissue and other material debrided: Non-Viable, Slough, Slough Level: Non-Viable Tissue Debridement Description: Selective/Open Wound Instrument: Curette Bleeding: Minimum Hemostasis Achieved: Pressure Procedural Pain: 0 Post  Procedural Pain: 0 Response to Treatment: Procedure was tolerated well Level of Consciousness (Post- Awake and Alert procedure): Post Debridement Measurements of Total Wound Length: (cm) 0.7 Stage: Category/Stage III Width: (cm) 0.4 Depth: (cm) 0.1 Volume: (cm) 0.022 Character of Wound/Ulcer Post Debridement: Improved Post Procedure Diagnosis [name], [name] ([id number]) [id number] Page 2 of 9 Same as Pre-procedure Notes Scribed for Dr. Lady Gary by Zenaida Deed, RN Electronic Signature(s) Signed: 03/13/2023 12:40:03 PM By: Duanne Guess MD FACS Signed: 03/13/2023 4:55:31 PM By: Zenaida Deed RN, BSN Entered By: Zenaida Deed on 03/13/2023 08:57:24 -------------------------------------------------------------------------------- HPI Details Patient Name: Date of Service: [name] M M. 03/13/2023 11:15 A M Medical Record Number: [id number] Patient Account Number: [id number] Date of Birth/Sex: Treating RN: 25-Oct-1945 (77 y.o. M) Primary Care Provider: Vilinda Boehringer, DHA NA [facility] Other Clinician: Referring Provider: Treating Provider/Extender: Estrella Myrtle SHI, DHA NA [facility] Weeks in Treatment: 9 History of Present Illness HPI Description: ADMISSION 01/09/2023 This is a 77 year old known diabetic who underwent a left ankle ORIF on Nov 26, 2022. He was seen by orthopedic surgery for follow-up on December 26, 2022 and was noted to have breakdown of his left lateral leg incision. He was also given a course of Keflex at that time. He also developed pressure ulcers on his posterior Achilles area, his lateral calcaneus, and his medial malleolus from the boot he was wearing. He has a pressure ulcer on his right heel, as well. He was referred to the wound care center for further evaluation and management. 01/17/2023: All of the wounds are little bit smaller today with the exception of the one over his posterior Achilles on the left. This wound is bigger  and has a fibrous layer to it that is densely adherent. There is slough accumulation on all of the wounds. He says he has not been using the Prevalon boot because he frequently has to get up and urinate but he does report that he is floating his heels on pillows. 01/27/2023: The surgical  incision site is healed. His medial ankle wound is down to just a couple of millimeters. His lateral left heel wound is also quite a bit smaller. There is minimal slough accumulation on this wound. The Achilles area on the left is still about the same size with thick, fibrotic, adherent non-viable tissue. The wound on his right heel is smaller. There is still some depth at the center with slough accumulation. 01/31/2023: The medial ankle wound has some eschar on the surface, underneath which it is about a millimeter in each dimension. The left lateral heel wound is down to a couple of millimeters with just some slough on the surface. The right calcaneal wound is also smaller with good epithelialization coming in. The Achilles area wound also continues to contract and the adherent nonviable tissue is much softer today. 02/07/2023: The left medial ankle wound is closed. The right calcaneal wound is very superficial and clean without any slough accumulation. The left lateral heel wound is also quite a bit smaller and appears similar to the right heel wound. The posterior ankle wound over the Achilles is contracting and there is a band of skin that has almost divided this wound in two. The rubbery slough is still present but soft. 02/21/2023: The heel wounds have both closed. The wound on his posterior ankle at the Achilles is smaller and the band of skin has divided it into 2 separate areas. There is slough accumulation on the wound surface. No concern for infection. 03/01/2023: The wound on his posterior ankle over the Achilles is about half the size as it was last week. The entire top portion has epithelialized. There is  still a layer of rubbery slough that has accumulated. No concern for infection. 03/07/2023: The wound continues to contract at a considerable rate. There is some eschar and light slough on the remaining open surface. 03/13/2023: The wound is down to about half a centimeter in greatest dimension. It is very clean without any slough accumulation. He is scheduled to undergo reconstruction of his ankle on Thursday. Electronic Signature(s) Signed: 03/13/2023 12:16:02 PM By: Duanne Guess MD FACS Entered By: Duanne Guess on 03/13/2023 09:16:02 -------------------------------------------------------------------------------- Physical Exam Details Patient Name: Date of Service: [name] M. 03/13/2023 11:15 A M Medical Record Number: [id number] Patient Account Number: [id number] ADREW, DUNST ([id number]) [id number] Page 3 of 9 Date of Birth/Sex: Treating RN: 1946/05/15 (77 y.o. M) Primary Care Provider: Vilinda Boehringer, DHA NA [facility] Other Clinician: Referring Provider: Treating Provider/Extender: Estrella Myrtle SHI, DHA NA [facility] Weeks in Treatment: 9 Constitutional Hypertensive, asymptomatic. . . . no acute distress. Respiratory Normal work of breathing on room air. Notes 03/13/2023: The wound is down to about half a centimeter in greatest dimension. It is very clean without any slough accumulation. Electronic Signature(s) Signed: 03/13/2023 12:18:08 PM By: Duanne Guess MD FACS Entered By: Duanne Guess on 03/13/2023 09:18:08 -------------------------------------------------------------------------------- Physician Orders Details Patient Name: Date of Service: [name] M M. 03/13/2023 11:15 A M Medical Record Number: [id number] Patient Account Number: [id number] Date of Birth/Sex: Treating RN: August 19, 1945 (77 y.o. [name] Primary Care Provider: Vilinda Boehringer, DHA NA [facility] Other Clinician: Referring Provider: Treating  Provider/Extender: Estrella Myrtle SHI, DHA NA [facility] Weeks in Treatment: 9 Verbal / Phone Orders: No Diagnosis Coding ICD-10 Coding Code Description [phone] Pressure ulcer of left ankle, stage 3 G62.9 Polyneuropathy, unspecified Follow-up Appointments ppointment in 1 week. - Dr. Lady Gary RM 1 Return A Tuesday 9/17 @ 11:15 am  Anesthetic Wound #2 Left,Posterior Lower Leg (In clinic) Topical Lidocaine 4% applied to wound bed Bathing/ Shower/ Hygiene May shower and wash wound with soap and water. Off-Loading Prevalon Boot - Prevalon boot to right foot especially while in bed Other: - Avoid pressure on the posterior (back ) of left lower leg Home Health New wound care orders this week; continue Home Health for wound care. May utilize formulary equivalent dressing for wound treatment orders unless otherwise specified. - discontinue santyl Dressing changes to be completed by Home Health on Monday / Wednesday / Friday except when patient has scheduled visit at Wound Care Center. - [facility] to change dressing 3 times next week Other Home Health Orders/Instructions: Verna Czech fax- [phone] Wound Treatment Wound #2 - Lower Leg Wound Laterality: Left, Posterior Prim Dressing: Hydrofera Blue Ready Transfer Foam, 2.5x2.5 (in/in) 3 x Per Week/30 Days ary Discharge Instructions: Apply directly to wound bed as directed Secondary Dressing: ALLEVYN Gentle Border, 3x3 (in/in) 3 x Per Week/30 Days Discharge Instructions: Apply over primary dressing as directed. Secured With: Elastic Bandage 4 inch (ACE bandage) 3 x Per Week/30 Days [name], [name] ([id number]) [id number] Page 4 of 9 Discharge Instructions: Secure with ACE bandage to hold splint in place Electronic Signature(s) Signed: 03/13/2023 12:40:03 PM By: Duanne Guess MD FACS Entered By: Duanne Guess on 03/13/2023  09:18:22 -------------------------------------------------------------------------------- Problem List Details Patient Name: Date of Service: [name] M. 03/13/2023 11:15 A M Medical Record Number: [id number] Patient Account Number: [id number] Date of Birth/Sex: Treating RN: 28-Sep-1945 (77 y.o. [name] Primary Care Provider: Vilinda Boehringer, DHA NA [facility] Other Clinician: Referring Provider: Treating Provider/Extender: Estrella Myrtle SHI, DHA NA [facility] Weeks in Treatment: 9 Active Problems ICD-10 Encounter Code Description Active Date MDM Diagnosis L89.523 Pressure ulcer of left ankle, stage 3 01/09/2023 No Yes G62.9 Polyneuropathy, unspecified 01/09/2023 No Yes Inactive Problems Resolved Problems ICD-10 Code Description Active Date Resolved Date L97.322 Non-pressure chronic ulcer of left ankle with fat layer exposed 01/09/2023 01/09/2023 L89.623 Pressure ulcer of left heel, stage 3 01/09/2023 01/09/2023 L89.613 Pressure ulcer of right heel, stage 3 01/09/2023 01/09/2023 Electronic Signature(s) Signed: 03/13/2023 12:11:19 PM By: Duanne Guess MD FACS Entered By: Duanne Guess on 03/13/2023 09:11:19 -------------------------------------------------------------------------------- Progress Note Details Patient Name: Date of Service: [name] M. 03/13/2023 11:15 A M Medical Record Number: [id number] Patient Account Number: [id number] Date of Birth/Sex: Treating RN: [birth year] (77 y.o. M) Primary Care Provider: [facility], DHA NA [facility] Other Clinician: DRESEAN, [name] ([id number]) [id number] Page 5 of 9 Referring Provider: Treating Provider/Extender: Estrella Myrtle SHI, DHA NA [facility] Weeks in Treatment: 9 Subjective Chief Complaint Information obtained from Patient Patient presents to the wound care center with open non-healing surgical wound(s) and pressure ulcers History of Present Illness (HPI) ADMISSION 01/09/2023 This is a  77 year old known diabetic who underwent a left ankle ORIF on Nov 26, 2022. He was seen by orthopedic surgery for follow-up on December 26, 2022 and was noted to have breakdown of his left lateral leg incision. He was also given a course of Keflex at that time. He also developed pressure ulcers on his posterior Achilles area, his lateral calcaneus, and his medial malleolus from the boot he was wearing. He has a pressure ulcer on his right heel, as well. He was referred to the wound care center for further evaluation and management. 01/17/2023: All of the wounds are little bit smaller today with the exception of the one over his  posterior Achilles on the left. This wound is bigger and has a fibrous layer to it that is densely adherent. There is slough accumulation on all of the wounds. He says he has not been using the Prevalon boot because he frequently has to get up and urinate but he does report that he is floating his heels on pillows. 01/27/2023: The surgical incision site is healed. His medial ankle wound is down to just a couple of millimeters. His lateral left heel wound is also quite a bit smaller. There is minimal slough accumulation on this wound. The Achilles area on the left is still about the same size with thick, fibrotic, adherent non-viable tissue. The wound on his right heel is smaller. There is still some depth at the center with slough accumulation. 01/31/2023: The medial ankle wound has some eschar on the surface, underneath which it is about a millimeter in each dimension. The left lateral heel wound is down to a couple of millimeters with just some slough on the surface. The right calcaneal wound is also smaller with good epithelialization coming in. The Achilles area wound also continues to contract and the adherent nonviable tissue is much softer today. 02/07/2023: The left medial ankle wound is closed. The right calcaneal wound is very superficial and clean without any slough  accumulation. The left lateral heel wound is also quite a bit smaller and appears similar to the right heel wound. The posterior ankle wound over the Achilles is contracting and there is a band of skin that has almost divided this wound in two. The rubbery slough is still present but soft. 02/21/2023: The heel wounds have both closed. The wound on his posterior ankle at the Achilles is smaller and the band of skin has divided it into 2 separate areas. There is slough accumulation on the wound surface. No concern for infection. 03/01/2023: The wound on his posterior ankle over the Achilles is about half the size as it was last week. The entire top portion has epithelialized. There is still a layer of rubbery slough that has accumulated. No concern for infection. 03/07/2023: The wound continues to contract at a considerable rate. There is some eschar and light slough on the remaining open surface. 03/13/2023: The wound is down to about half a centimeter in greatest dimension. It is very clean without any slough accumulation. He is scheduled to undergo reconstruction of his ankle on Thursday. Patient History Information obtained from Patient, Caregiver, Chart. Family History Heart Disease - Mother,Father, Hypertension - Mother,Father, Stroke - Father, No family history of Cancer, Diabetes, Hereditary Spherocytosis, Kidney Disease, Lung Disease, Seizures, Thyroid Problems, Tuberculosis. Social History Never smoker, Marital Status - Married, Alcohol Use - Rarely, Drug Use - No History, Caffeine Use - Daily - coffee. Medical History Eyes Patient has history of Cataracts - bil extractions Denies history of Glaucoma, Optic Neuritis Ear/Nose/Mouth/Throat Denies history of Chronic sinus problems/congestion, Middle ear problems Respiratory Patient has history of Sleep Apnea Cardiovascular Patient has history of Hypertension Endocrine Denies history of Type I Diabetes, Type II  Diabetes Genitourinary Denies history of End Stage Renal Disease Immunological Denies history of Lupus Erythematosus, Raynauds, Scleroderma Integumentary (Skin) Denies history of History of Burn Musculoskeletal Patient has history of Osteoarthritis Denies history of Gout, Rheumatoid Arthritis, Osteomyelitis Neurologic Denies history of Dementia, Neuropathy, Quadriplegia, Paraplegia, Seizure Disorder Oncologic Denies history of Received Chemotherapy, Received Radiation Psychiatric Denies history of Anorexia/bulimia, Confinement Anxiety Hospitalization/Surgery History - bil knee replacements. - ORIF left ankel 5/23 2024. - cholecystectomy. -  cervical fusion. Medical A Surgical History Notes nd Respiratory hx aspiration PNA, asbestosis Gastrointestinal [name], [name] ([id number]) [id number] Page 6 of 9 duodenitis, constipation Musculoskeletal ankylosing spondylitis, compression fx T10 Objective Constitutional Hypertensive, asymptomatic. no acute distress. Vitals Time Taken: 11:39 AM, Height: 72 in, Weight: 240 lbs, BMI: 32.5, Temperature: 97.9 F, Pulse: 70 bpm, Respiratory Rate: 18 breaths/min, Blood Pressure: 162/64 mmHg. Respiratory Normal work of breathing on room air. General Notes: 03/13/2023: The wound is down to about half a centimeter in greatest dimension. It is very clean without any slough accumulation. Integumentary (Hair, Skin) Wound #2 status is Open. Original cause of wound was Pressure Injury. The date acquired was: 12/03/2022. The wound has been in treatment 9 weeks. The wound is located on the Left,Posterior Lower Leg. The wound measures 0.7cm length x 0.4cm width x 0.1cm depth; 0.22cm^2 area and 0.022cm^3 volume. There is Fat Layer (Subcutaneous Tissue) exposed. There is no tunneling or undermining noted. There is a small amount of serosanguineous drainage noted. The wound margin is distinct with the outline attached to the wound  base. There is large (67-100%) red, pink granulation within the wound bed. There is a small (1-33%) amount of necrotic tissue within the wound bed including Adherent Slough. The periwound skin appearance had no abnormalities noted for texture. The periwound skin appearance had no abnormalities noted for moisture. The periwound skin appearance had no abnormalities noted for color. Periwound temperature was noted as No Abnormality. Assessment Active Problems ICD-10 Pressure ulcer of left ankle, stage 3 Polyneuropathy, unspecified Procedures Wound #2 Pre-procedure diagnosis of Wound #2 is a Pressure Ulcer located on the Left,Posterior Lower Leg . There was a Selective/Open Wound Non-Viable Tissue Debridement with a total area of 0.22 sq cm performed by Duanne Guess, MD. With the following instrument(s): Curette to remove Non-Viable tissue/material. Material removed includes [employer] after achieving pain control using Lidocaine 4% Topical Solution. No specimens were taken. A time out was conducted at 11:55, prior to the start of the procedure. A Minimum amount of bleeding was controlled with Pressure. The procedure was tolerated well with a pain level of 0 throughout and a pain level of 0 following the procedure. Post Debridement Measurements: 0.7cm length x 0.4cm width x 0.1cm depth; 0.022cm^3 volume. Post debridement Stage noted as Category/Stage III. Character of Wound/Ulcer Post Debridement is improved. Post procedure Diagnosis Wound #2: Same as Pre-Procedure General Notes: Scribed for Dr. Lady Gary by Zenaida Deed, RN. Plan Follow-up Appointments: Return Appointment in 1 week. - Dr. Lady Gary RM 1 Tuesday 9/17 @ 11:15 am Anesthetic: Wound #2 Left,Posterior Lower Leg: (In clinic) Topical Lidocaine 4% applied to wound bed Bathing/ Shower/ Hygiene: May shower and wash wound with soap and water. Off-Loading: Prevalon Boot - Prevalon boot to right foot especially while in bed Other: - Avoid  pressure on the posterior (back ) of left lower leg Home Health: New wound care orders this week; continue Home Health for wound care. May utilize formulary equivalent dressing for wound treatment orders unless otherwise specified. - discontinue santyl Dressing changes to be completed by Home Health on Monday / Wednesday / Friday except when patient has scheduled visit at [facility]. - [facility] to [name], [name] ([id number]) [id number] Page 7 of 9 change dressing 3 times next week Other Home Health Orders/Instructions: - Verna Czech fax- [phone] WOUND #2: - Lower Leg Wound Laterality: Left, Posterior Prim Dressing: Hydrofera Blue Ready Transfer Foam, 2.5x2.5 (in/in) 3 x Per Week/30 Days ary Discharge Instructions: Apply directly to  wound bed as directed Secondary Dressing: ALLEVYN Gentle Border, 3x3 (in/in) 3 x Per Week/30 Days Discharge Instructions: Apply over primary dressing as directed. Secured With: Elastic Bandage 4 inch (ACE bandage) 3 x Per Week/30 Days Discharge Instructions: Secure with ACE bandage to hold splint in place 03/13/2023: The wound is down to about half a centimeter in greatest dimension. It is very clean without any significant slough accumulation. I used a curette to debride minimal slough and some biofilm from the wound surface. I think we can discontinue using Santyl and just use Hydrofera Blue. After his surgery, they are not sure what kind of access they will have to the wound in terms of splinting or casting. We will plan to see him next week, but if it turns out that he is in a hard cast that cannot be removed, we will defer visits until we are able to access the wound again. Electronic Signature(s) Signed: 03/13/2023 12:20:11 PM By: Duanne Guess MD FACS Entered By: Duanne Guess on 03/13/2023 09:20:11 -------------------------------------------------------------------------------- HxROS Details Patient  Name: Date of Service: [name] M M. 03/13/2023 11:15 A M Medical Record Number: [id number] Patient Account Number: [id number] Date of Birth/Sex: Treating RN: 07/07/1945 (77 y.o. M) Primary Care Provider: Vilinda Boehringer, DHA NA [facility] Other Clinician: Referring Provider: Treating Provider/Extender: Estrella Myrtle SHI, DHA NA [facility] Weeks in Treatment: 9 Information Obtained From Patient Caregiver Chart Eyes Medical History: Positive for: Cataracts - bil extractions Negative for: Glaucoma; Optic Neuritis Ear/Nose/Mouth/Throat Medical History: Negative for: Chronic sinus problems/congestion; Middle ear problems Respiratory Medical History: Positive for: Sleep Apnea Past Medical History Notes: hx aspiration PNA, asbestosis Cardiovascular Medical History: Positive for: Hypertension Gastrointestinal Medical History: Past Medical History Notes: duodenitis, constipation Endocrine Medical History: Negative for: Type I Diabetes; Type II Diabetes [name], [name] ([id number]) [id number] Page 8 of 9 Genitourinary Medical History: Negative for: End Stage Renal Disease Immunological Medical History: Negative for: Lupus Erythematosus; Raynauds; Scleroderma Integumentary (Skin) Medical History: Negative for: History of Burn Musculoskeletal Medical History: Positive for: Osteoarthritis Negative for: Gout; Rheumatoid Arthritis; Osteomyelitis Past Medical History Notes: ankylosing spondylitis, compression fx T10 Neurologic Medical History: Negative for: Dementia; Neuropathy; Quadriplegia; Paraplegia; Seizure Disorder Oncologic Medical History: Negative for: Received Chemotherapy; Received Radiation Psychiatric Medical History: Negative for: Anorexia/bulimia; Confinement Anxiety HBO Extended History Items Eyes: Cataracts Immunizations Pneumococcal Vaccine: Received Pneumococcal Vaccination: Yes Received Pneumococcal Vaccination On or  After 60th Birthday: Yes Implantable Devices Yes Hospitalization / Surgery History Type of Hospitalization/Surgery bil knee replacements ORIF left ankel 5/23 2024 cholecystectomy cervical fusion Family and Social History Cancer: No; Diabetes: No; Heart Disease: Yes - Mother,Father; Hereditary Spherocytosis: No; Hypertension: Yes - Mother,Father; Kidney Disease: No; Lung Disease: No; Seizures: No; Stroke: Yes - Father; Thyroid Problems: No; Tuberculosis: No; Never smoker; Marital Status - Married; Alcohol Use: Rarely; Drug Use: No History; Caffeine Use: Daily - coffee; Financial Concerns: No; Food, Clothing or Shelter Needs: No; Support System Lacking: No; Transportation Concerns: No Electronic Signature(s) Signed: 03/13/2023 12:40:03 PM By: Duanne Guess MD FACS Entered By: Duanne Guess on 03/13/2023 09:16:12 [name] ([id number]) [id number] Page 9 of 9 -------------------------------------------------------------------------------- SuperBill Details Patient Name: Date of Service: [facility] Oralia [name]. 03/13/2023 Medical Record Number: [id number] Patient Account Number: [id number] Date of Birth/Sex: Treating RN: [birth year] (77 y.o. M) Primary Care Provider: Vilinda Boehringer, DHA NA [facility] Other Clinician: Referring Provider: Treating Provider/Extender: Estrella Myrtle SHI, DHA NA [facility] Weeks in Treatment: 9 Diagnosis Coding ICD-10 Codes Code Description [phone] Pressure  ulcer of left ankle, stage 3 G62.9 Polyneuropathy, unspecified Facility Procedures : CPT4 Code: [id number] Description: 7597 - DEBRIDE WOUND 1ST 20 SQ CM OR < ICD-10 Diagnosis Description L89.523 Pressure ulcer of left ankle, stage 3 Modifier: Quantity: 1 Physician Procedures : CPT4 Code Description Modifier [id number] - WC PHYS LEVEL 3 - EST PT 25 ICD-10 Diagnosis Description L89.523 Pressure ulcer of left ankle, stage 3 G62.9 Polyneuropathy, unspecified Quantity:  1 : [id number] - WC PHYS DEBR WO ANESTH 20 SQ CM ICD-10 Diagnosis Description L89.523 Pressure ulcer of left ankle, stage 3 Quantity: 1 Electronic Signature(s) Signed: 03/13/2023 12:20:23 PM By: Duanne Guess MD FACS Entered By: Duanne Guess on 03/13/2023 09:20:23

## 2023-03-13 NOTE — Progress Notes (Addendum)
[name], [name] ([id number]) [id number] Page 1 of 7 Visit Report for 03/13/2023 Arrival Information Details Patient Name: Date of Service: [facility] [name] 03/13/2023 11:15 A M Medical Record Number: [id number] Patient Account Number: [id number] Date of Birth/Sex: Treating RN: [birth year] (77 y.o. [name] Primary Care Ares Tegtmeyer: Vilinda Boehringer, DHA NA [facility] Other Clinician: Referring Gurfateh Mcclain: Treating Kashon Kraynak/Extender: Estrella Myrtle SHI, DHA NA [facility] Weeks in Treatment: 9 Visit Information History Since Last Visit Added or deleted any medications: No Patient Arrived: Wheel Chair Any new allergies or adverse reactions: No Arrival Time: 11:39 Had a fall or experienced change in No Accompanied By: spouse activities of daily living that may affect Transfer Assistance: None risk of falls: Patient Identification Verified: Yes Signs or symptoms of abuse/neglect since last visito No Secondary Verification Process Completed: Yes Hospitalized since last visit: No Patient Requires Transmission-Based Precautions: No Implantable device outside of Neil clinic excluding No Patient Has Alerts: No cellular tissue based products placed in Neil center since last visit: Has Dressing in Place as Prescribed: Yes Has Footwear/Offloading in Place as Prescribed: Yes Left: Other:ortho splint Pain Present Now: No Electronic Signature(s) Signed: 03/13/2023 4:55:31 PM By: Zenaida Deed RN, BSN Entered By: Zenaida Deed on 03/13/2023 11:41:01 -------------------------------------------------------------------------------- Encounter Discharge Information Details Patient Name: Date of Service: Neil Pearson M M. 03/13/2023 11:15 A M Medical Record Number: [id number] Patient Account Number: [id number] Date of Birth/Sex: Treating RN: [birth year] (77 y.o. [name] Primary Care Archer Moist: Vilinda Boehringer, DHA NA [facility] Other Clinician: Referring  Genene Kilman: Treating Tashawn Greff/Extender: Estrella Myrtle SHI, DHA NA [facility] Weeks in Treatment: 9 Encounter Discharge Information Items Post Procedure Vitals Discharge Condition: Stable Temperature (F): 97.9 Ambulatory Status: Wheelchair Pulse (bpm): 70 Discharge Destination: Home Respiratory Rate (breaths/min): 18 Transportation: Private Auto Blood Pressure (mmHg): 162/64 Accompanied By: spouse Schedule Follow-up Appointment: Yes Clinical Summary of Care: Patient Declined Electronic Signature(s) Signed: 03/13/2023 4:55:31 PM By: Zenaida Deed RN, BSN Entered By: Zenaida Deed on 03/13/2023 12:09:07 [name] ([id number]) [id number] Page 2 of 7 -------------------------------------------------------------------------------- Lower Extremity Assessment Details Patient Name: Date of Service: [facility] [name] 03/13/2023 11:15 A M Medical Record Number: [id number] Patient Account Number: [id number] Date of Birth/Sex: Treating RN: Aug 11, 1945 (77 y.o. [name] Primary Care Emmanuella Mirante: Vilinda Boehringer, DHA NA [facility] Other Clinician: Referring Jennetta Flood: Treating Adeleine Pask/Extender: Estrella Myrtle SHI, DHA NA [facility] Weeks in Treatment: 9 Edema Assessment Assessed: [Left: No] [Right: No] Edema: [Left: Ye] [Right: s] Calf Left: Right: Point of Measurement: From Medial Instep 35.5 cm Ankle Left: Right: Point of Measurement: From Medial Instep 25.5 cm Vascular Assessment Pulses: Dorsalis Pedis Palpable: [Left:Yes] Extremity colors, hair growth, and conditions: Extremity Color: [Left:Normal] Hair Growth on Extremity: [Left:Yes] Temperature of Extremity: [Left:Warm] Capillary Refill: [Left:< 3 seconds] Dependent Rubor: [Left:No No] Electronic Signature(s) Signed: 03/13/2023 4:55:31 PM By: Zenaida Deed RN, BSN Entered By: Zenaida Deed on 03/13/2023  11:45:43 -------------------------------------------------------------------------------- Multi Wound Chart Details Patient Name: Date of Service: Neil Pearson M M. 03/13/2023 11:15 A M Medical Record Number: [id number] Patient Account Number: [id number] Date of Birth/Sex: Treating RN: 1946-02-17 (77 y.o. M) Primary Care Gypsy Kellogg: Vilinda Boehringer, DHA NA [facility] Other Clinician: Referring Buena Boehm: Treating Nickey Canedo/Extender: Estrella Myrtle SHI, DHA NA [facility] Weeks in Treatment: 9 Vital Signs Height(in): 72 Pulse(bpm): 70 Weight(lbs): 240 Blood Pressure(mmHg): 162/64 Body Mass Index(BMI): 32.5 Temperature(F): 97.9 Respiratory Rate(breaths/min): 18 [2:Photos:] [N/A:N/A [id number] Page 3 of 7] Left, Posterior Lower Leg N/A N/A Wound  Location: Pressure Injury N/A N/A Wounding Event: Pressure Ulcer N/A N/A Primary Etiology: Cataracts, Sleep Apnea, Hypertension, N/A N/A Comorbid History: Osteoarthritis 12/03/2022 N/A N/A Date Acquired: 9 N/A N/A Weeks of Treatment: Open N/A N/A Wound Status: No N/A N/A Wound Recurrence: 0.7x0.4x0.1 N/A N/A Measurements L x W x D (cm) 0.22 N/A N/A A (cm) : rea 0.022 N/A N/A Volume (cm) : 81.30% N/A N/A % Reduction in A rea: 81.40% N/A N/A % Reduction in Volume: Category/Stage III N/A N/A Classification: Small N/A N/A Exudate A mount: Serosanguineous N/A N/A Exudate Type: red, brown N/A N/A Exudate Color: Distinct, outline attached N/A N/A Wound Margin: Large (67-100%) N/A N/A Granulation A mount: Red, Pink N/A N/A Granulation Quality: Small (1-33%) N/A N/A Necrotic A mount: Fat Layer (Subcutaneous Tissue): Yes N/A N/A Exposed Structures: Fascia: No Tendon: No Muscle: No Joint: No Bone: No Small (1-33%) N/A N/A Epithelialization: Debridement - Selective/Open Wound N/A N/A Debridement: Pre-procedure Verification/Time Out 11:55 N/A N/A Taken: Lidocaine 4% Topical Solution N/A N/A Pain  Control: Slough N/A N/A Tissue Debrided: Non-Viable Tissue N/A N/A Level: 0.22 N/A N/A Debridement A (sq cm): rea Curette N/A N/A Instrument: Minimum N/A N/A Bleeding: Pressure N/A N/A Hemostasis A chieved: 0 N/A N/A Procedural Pain: 0 N/A N/A Post Procedural Pain: Procedure was tolerated well N/A N/A Debridement Treatment Response: 0.7x0.4x0.1 N/A N/A Post Debridement Measurements L x W x D (cm) 0.022 N/A N/A Post Debridement Volume: (cm) Category/Stage III N/A N/A Post Debridement Stage: No Abnormalities Noted N/A N/A Periwound Skin Texture: No Abnormalities Noted N/A N/A Periwound Skin Moisture: No Abnormalities Noted N/A N/A Periwound Skin Color: No Abnormality N/A N/A Temperature: Debridement N/A N/A Procedures Performed: Treatment Notes Wound #2 (Lower Leg) Wound Laterality: Left, Posterior Cleanser Peri-Wound Care Topical Primary Dressing Hydrofera Blue Ready Transfer Foam, 2.5x2.5 (in/in) Discharge Instruction: Apply directly to wound bed as directed Secondary Dressing ALLEVYN Gentle Border, 3x3 (in/in) Discharge Instruction: Apply over primary dressing as directed. Secured With Elastic Bandage 4 inch (ACE bandage) Discharge Instruction: Secure with ACE bandage to hold splint in place Compression Wrap Compression Stockings GARLAN, DREWES ([id number]) [id number] Page 4 of 7 Add-Ons Electronic Signature(s) Signed: 03/13/2023 12:13:30 PM By: Duanne Guess MD FACS Entered By: Duanne Guess on 03/13/2023 12:13:30 -------------------------------------------------------------------------------- Multi-Disciplinary Care Plan Details Patient Name: Date of Service: [facility] MSO Rockne Coons M M. 03/13/2023 11:15 A M Medical Record Number: [id number] Patient Account Number: [id number] Date of Birth/Sex: Treating RN: 1946-06-12 (77 y.o. [name] Primary Care Jaron Czarnecki: Vilinda Boehringer, DHA NA [facility] Other Clinician: Referring  Kourosh Jablonsky: Treating Dollene Mallery/Extender: Estrella Myrtle SHI, DHA NA [facility] Weeks in Treatment: 9 Multidisciplinary Care Plan reviewed with physician Active Inactive Electronic Signature(s) Signed: 04/03/2023 4:00:06 PM By: Zenaida Deed RN, BSN Previous Signature: 03/13/2023 4:55:31 PM Version By: Zenaida Deed RN, BSN Entered By: Zenaida Deed on 03/31/2023 62:95:28 -------------------------------------------------------------------------------- Pain Assessment Details Patient Name: Date of Service: Neil Pearson M M. 03/13/2023 11:15 A M Medical Record Number: [id number] Patient Account Number: [id number] Date of Birth/Sex: Treating RN: 1946/04/08 (76 y.o. [name] Primary Care Mashonda Broski: Vilinda Boehringer, DHA NA [facility] Other Clinician: Referring Jaymes Revels: Treating Maleea Camilo/Extender: Estrella Myrtle SHI, DHA NA [facility] Weeks in Treatment: 9 Active Problems Location of Pain Severity and Description of Pain Patient Has Paino No Site Locations Rate Neil pain. Current Pain Level: 0 JONERIC, [name] ([id number]) [id number] Page 5 of 7 Pain Management and Medication Current Pain Management: Electronic Signature(s) Signed: 03/13/2023 4:55:31 PM By: Zenaida Deed  RN, BSN Entered By: Zenaida Deed on 03/13/2023 11:41:18 -------------------------------------------------------------------------------- Patient/Caregiver Education Details Patient Name: Date of Service: [facility] [name] 9/9/2024andnbsp11:15 A M Medical Record Number: [id number] Patient Account Number: [id number] Date of Birth/Gender: Treating RN: [birth year] (77 y.o. [name] Primary Care Physician: Vilinda Boehringer, DHA NA [facility] Other Clinician: Referring Physician: Treating Physician/Extender: Estrella Myrtle SHI, DHA NA [facility] Weeks in Treatment: 9 Education Assessment Education Provided To: Patient Education Topics Provided Venous: Methods:  Explain/Verbal Responses: Reinforcements needed, State content correctly Electronic Signature(s) Signed: 03/13/2023 4:55:31 PM By: Zenaida Deed RN, BSN Entered By: Zenaida Deed on 03/13/2023 11:47:44 -------------------------------------------------------------------------------- Wound Assessment Details Patient Name: Date of Service: Neil Pearson M M. 03/13/2023 11:15 A M Medical Record Number: [id number] Patient Account Number: [id number] Date of Birth/Sex: Treating RN: 1946-06-26 (77 y.o. [name] Primary Care Kyran Connaughton: Vilinda Boehringer, DHA NA [facility] Other Clinician: Referring Jaideep Pollack: Treating Rutha Melgoza/Extender: Estrella Myrtle SHI, DHA NA [facility] Weeks in Treatment: 9 Wound Status Wound Number: 2 Primary Etiology: Pressure Ulcer Wound Location: Left, Posterior Lower Leg Wound Status: Open Wounding Event: Pressure Injury Comorbid History: Cataracts, Sleep Apnea, Hypertension, Osteoarthritis Date Acquired: 12/03/2022 Weeks Of Treatment: 9 Clustered Wound: No Photos [name], [name] ([id number]) [id number] Page 6 of 7 Wound Measurements Length: (cm) 0.7 Width: (cm) 0.4 Depth: (cm) 0.1 Area: (cm) 0.22 Volume: (cm) 0.022 % Reduction in Area: 81.3% % Reduction in Volume: 81.4% Epithelialization: Small (1-33%) Tunneling: No Undermining: No Wound Description Classification: Category/Stage III Wound Margin: Distinct, outline attached Exudate Amount: Small Exudate Type: Serosanguineous Exudate Color: red, brown Foul Odor After Cleansing: No Slough/Fibrino Yes Wound Bed Granulation Amount: Large (67-100%) Exposed Structure Granulation Quality: Red, Pink Fascia Exposed: No Necrotic Amount: Small (1-33%) Fat Layer (Subcutaneous Tissue) Exposed: Yes Necrotic Quality: Adherent Slough Tendon Exposed: No Muscle Exposed: No Joint Exposed: No Bone Exposed: No Periwound Skin Texture Texture Color No Abnormalities Noted: Yes No  Abnormalities Noted: Yes Moisture Temperature / Pain No Abnormalities Noted: Yes Temperature: No Abnormality Electronic Signature(s) Signed: 03/13/2023 4:55:31 PM By: Zenaida Deed RN, BSN Entered By: Zenaida Deed on 03/13/2023 11:50:06 -------------------------------------------------------------------------------- Vitals Details Patient Name: Date of Service: Neil Pearson M M. 03/13/2023 11:15 A M Medical Record Number: [id number] Patient Account Number: [id number] Date of Birth/Sex: Treating RN: [birth year] (77 y.o. [name] Primary Care Cecilia Vancleve: Vilinda Boehringer, DHA NA [facility] Other Clinician: Referring Tenita Cue: Treating Del Overfelt/Extender: Estrella Myrtle SHI, DHA NA [facility] Weeks in Treatment: 9 Vital Signs Time Taken: 11:39 Temperature (F): 97.9 Height (in): 72 Pulse (bpm): 70 Weight (lbs): 240 Respiratory Rate (breaths/min): 18 Body Mass Index (BMI): 32.5 Blood Pressure (mmHg): 162/64 Reference Range: 80 - 120 mg / dl Electronic Signature(s) BRODI, KARI ([id number]) [id number] Page 7 of 7 Signed: 03/13/2023 4:55:31 PM By: Zenaida Deed RN, BSN Entered By: Zenaida Deed on 03/13/2023 11:41:10

## 2023-03-21 ENCOUNTER — Ambulatory Visit (HOSPITAL_BASED_OUTPATIENT_CLINIC_OR_DEPARTMENT_OTHER): Payer: Medicare Other | Admitting: General Surgery

## 2023-03-28 ENCOUNTER — Encounter (HOSPITAL_BASED_OUTPATIENT_CLINIC_OR_DEPARTMENT_OTHER): Payer: Medicare Other | Admitting: General Surgery

## 2023-07-12 NOTE — Progress Notes (Signed)
 Encounter Diagnoses: Closed fracture of distal end of left fibula, unspecified fracture morphology, initial encounter  Assessment & Plan 1. Status post intramedullary nailing ankle fracture. He reports minimal swelling and no signs of infection. Examination shows no skin lesions, adenopathy, or instability, and sensation is grossly intact. X-rays confirm that alignment is maintained, and hardware is in place with no significant changes. The primary concern for physical therapy is to improve his ability to stand and transfer. He is advised to continue weightbearing as tolerated and to inform us  of any changes or worsening of symptoms. He is currently undergoing physical therapy at the [place], with a focus on functional mobility rather than achieving full range of motion in the ankle.  Follow-up The patient will follow up as needed.  PROCEDURE The patient underwent ankle surgery with intramedullary nailing approximately 4 months ago.  Subjective Patient ID: [name] is a 78 y.o. male.  Chief Complaint: Follow-up of the Left Ankle   History of Present Illness The patient is about 4 months out from his ankle surgery. He has no complaints.  He reports experiencing mild tightness and stiffness in the ankle, which he attributes to the presence of the rod and other hardware. He is scheduled to start physical therapy at the [facility] tomorrow, having previously undergone similar treatment.    Objective     Physical Exam There is minimal swelling in the musculoskeletal system. No signs of infection. No skin lesions. No adenopathy. The calves are soft. Sensation is grossly intact. No instability.  Results Imaging X-rays show that he maintains his alignment. Hardware is in place. No significant change.  XR ankle 3+ views left      DOS: 07/12/2023 No change, hardware in place   [facility] Result No results found for this or any previous visit from the past 90  days.    [facility] CT Result No results found for this or any previous visit from the past 90 days.

## 2023-12-26 NOTE — Progress Notes (Signed)
 History of Present Illness The patient is a 78 year old male who presents for his annual Medicare wellness visit and annual physical examination. 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 For hypertension, he currently takes 25 mg of Cozaar  and 10 mg of amlodipine .  He reports experiencing chronic pain, which is [facility]. He underwent a CT scan of his back on 11/03/2023 and a urinalysis as part of his referral process. However, he has not received a referral in over 2 months. He also takes Lyrica  for pain management.  He has a history of falls, often losing his balance upon standing. He has been using a wheelchair for approximately 4 years. He has sustained multiple fractures due to falls, including a closed fracture of the distal end of the left fibula in 11/2022. He has undergone 2 surgeries and developed 4 pressure wounds during a month-long stay at [facility]. He also reports a broken toe, which he did not feel due to his neuropathy. He believes his inability to walk is due to his lack of balance and sensation in his feet. He has been receiving physical therapy through the [facility] for 30 years but has recently discontinued it. He uses a wheelchair and performs exercises at home using rubber bands.  He has osteoporosis and was previously on Prolia, but this was discontinued by the [facility].  He experiences diarrhea and a draining sensation when taking Movantik, which he finds beneficial for his bowel movements. He abstains from taking it on days when he has to go out as he does not have control over his bowels. He maintains hydration with Gatorade and other fluids.  He was using a CPAP machine for sleep apnea but reports poor sleep quality, often waking up in the middle of the night and unable  to return to sleep. He now sleeps in a chair in an upright position  He has hearing aids provided by the [facility] but finds them uncomfortable and ineffective in crowded environments.  He is on duloxetine  for depression and pain, which he finds helpful. He spends most of his time at home alone and prefers this to socializing.  PAST SURGICAL HISTORY: - Two surgeries for closed fracture of the distal end of the left fibula in 11/2022 -   Vitals:   12/26/23 1314  BP: (!) 115/48  Pulse: 66  Resp: 20  Temp: 97 F (36.1 C)  SpO2: 93%   Gen: Alert, oriented, non toxic, and well hydrated.  No signs of acute distress. HEENT: Normocephalic.  Atraumatic.  PERRLA.   Neck: Decreased range of motion neck with rotation and with flexion and extension. No lymphadenopathy Respiratory:  No use of accessory muscles. Cardiovascular: Regular rate and rhythm.   Abdominal:  Soft, non tender, non distended.    Neuro: Cranial nerves intact grossly. Complete loss of sensation in both lower legs.  MS:    No cyanosis, clubbing, or edema. Skin:  No rashes noted Psych: Oriented, alert.  Assessment & Plan  1. Stage 3a chronic kidney disease (*)      2. Paraparesis (*)      3. Current severe episode of major depressive disorder without psychotic features without prior episode (*)      4. Hypertension, essential, benign  Comprehensive Metabolic Panel   CBC And Differential   CBC And Differential   Comprehensive Metabolic  Panel   CANCELED: POCT ACR URINE    5. OSA (obstructive sleep apnea)      6. Pain syndrome, chronic      7. Idiopathic progressive neuropathy      8. Multiple falls      9. Screening for diabetes mellitus  POCT Hemoglobin A1C    10. Urinary frequency  PSA   PSA    11. Mixed hyperlipidemia  Lipid Panel With LDL/HDL Ratio   Lipid Panel With LDL/HDL Ratio    12. Asbestosis (*)      13. Annual physical exam      14. Encounter for subsequent annual wellness visit (AWV) in Medicare  patient       MDM:   1. Chronic Pain Syndrome. 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 - Currently taking oxycodone , which helps with the pain. - Continues to receive pain medication from the [facility]; alternative plan will be considered if [facility] stops [facility].   2. Hypertension. - Blood pressure noted to be low during the visit. - Recheck of blood pressure will be conducted. - Adjustments to current medication regimen may be necessary if blood pressure remains low. - Currently takes 25 mg of Cozaar  and 10 mg of amlodipine .  3. Idiopathic Progressive Neuropathy. - Idiopathic progressive neuropathy contributing to balance issues and falls. - Continues current treatment regimen, including Lyrica . - Reports inability to feel feet, leading to frequent falls. - Uses rubber bands for exercise to maintain leg strength.  4. Obstructive Sleep Apnea. - Obstructive sleep apnea managed by elevating head while sleeping. - No longer uses CPAP machine. - Reports waking up in the middle of the night and difficulty going back to sleep. - No changes to current management plan necessary.  5. Osteoporosis. - Diagnosed with osteoporosis, previously on Prolia. - Monitoring condition; DEXA scan will be considered if there are changes or concerns. - History of rib fractures due to falls. - [facility]  6. Depression. - Currently on duloxetine  (Cymbalta ) for depression and pain management. - Reports it helps maintain a stable mood. - May increase dosage to two tablets daily if needed, especially on days with increased social interaction. - Prefers being alone and finds social interactions frustrating.  7. Bowel Issues. - Experiences diarrhea and draining feeling when taking Movantik. - Advised to drink plenty of fluids, including water and Gatorade, to combat dehydration. - Reports lack of control over bowels  when taking Movantik. - Further evaluation will be considered if symptoms persist.  8. Health Maintenance. - Basic blood work will be ordered to ensure health parameters are up to date. - Eye exams are current, and he has been tested for glaucoma. - Continues to manage multiple health issues with a positive attitude.  Patient  verbalized to me that they understood what their problem is, what they need to do about it, and why it is important that they do it.  The patient/family voices understanding of all medications. No barriers to adherence were noted. Patient is taking all medications as prescribed and is tolerating well.  Plan for follow-up as discussed or as needed if any worsening symptoms or change in condition.     Medicare AWV  Jovonte Commins is a 78 y.o. male who presents for his subsequent annual wellness visit for Medicare.  Clinical documentation was reviewed and is accessible via encounter-level attachments.    Any physical exam components or additional concerns  beyond the scope of the Annual Wellness Visit may be documented in a separate note within this encounter.  Medicare Required Components     Reviewed and updated this visit by provider: Meds  PDMP       Substance Use Disorder Risk Statement: Increased Substance Use Disorder / Overdose risk - Risk factors were reviewed and patient is at increased risk. Reviewed controlled substance prescriptions and provided patient counseling.   Patient Care Team: Thedore Legions as PCP - General Lonni DELENA Charleston, MD as Pain Management Care Supervisor (Pain Medicine) Suzen MARLA Dade, PA-C as Physician Assistant (Gastroenterology) Elsie GORMAN Grieve, MD as Consulting Physician (General Surgery) Morene Fairy Floras, MD as Consulting Physician (Pulmonary Diseases) Johnsie LITTIE Rattler, RN as Registered Nurse Feliciano MARLA Hamilton, MD as Consulting Physician (Gastroenterology) Delon Elder, MD (Wound Care) Lynwood GORMAN Calkins, MD as Consulting Physician (Orthopedic Surgery) Toribio GORMAN Primes, MD as Consulting Physician (Orthopedic Surgery)  Vitals   Vitals:   12/26/23 1314  BP: (!) 115/48  Patient Position: Sitting  Pulse: 66  Temp: 97 F (36.1 C)  TempSrc: Temporal  Resp: 20  Height: 5' 10 (1.778 m)  Weight: 250 lb (113.4 kg)  SpO2: 93%  BMI (Calculated): 35.9  PainSc: 0-No pain    Disposition   1. Encounter for subsequent annual wellness visit (AWV) in Medicare patient (Primary) 2. Stage 3a chronic kidney disease (*) 3. Paraparesis (*) 4. Current severe episode of major depressive disorder without psychotic features without prior episode (*) 5. Hypertension, essential, benign -     Comprehensive Metabolic Panel; Future -     CBC And Differential; Future 6. OSA (obstructive sleep apnea) 7. Pain syndrome, chronic 8. Idiopathic progressive neuropathy 9. Multiple falls 10. Screening for diabetes mellitus -     POCT Hemoglobin A1C 11. Urinary frequency -     PSA; Future 12. Mixed hyperlipidemia -     Lipid Panel With LDL/HDL Ratio; Future 13. Asbestosis (*) 14. Annual physical exam   No follow-ups on file.   Health maintenance issues were discussed with the patient.  A written plan was provided to the patient in the form of patient instructions in the After Visit Summary document.

## 2023-12-31 NOTE — Progress Notes (Signed)
 Bill,  Your hgb is lower than normal. You might feel better if we check your iron levels and if low then replace the iron  [name]

## 2024-01-02 NOTE — Progress Notes (Signed)
 Patient's wife notified of results and recommendations. Patient's wife verbalized understanding.

## 2024-01-13 NOTE — Progress Notes (Signed)
 Your iron is low [name]. We can replace it. Do you want to take iron pills?  [name]

## 2024-05-08 NOTE — Progress Notes (Signed)
 Attempted to contact the patient to discuss upcoming appointments and recommended health maintenance screenings for the year.  A [facility] Specialist has reviewed the patient's chart to identify any gaps in care and outstanding preventive health needs. However, we were unable to reach the patient to review or schedule the necessary follow-up appointments and screenings.  Interpreter Services: Assistance used during this phone call. No  Left message: yes   MyChart message sent: yes   Voicemail was left including callback details and our hours of operation.  Comments:  Health Maintenance  Topic Date Due   Microalbumin/Creatinine Ratio  03/18/2020   DEXA Scan  02/27/2022   Influenza Vaccine (1) 03/04/2024   Creatinine Level  12/25/2024   Potassium Level  12/25/2024   GFR/EGFR  12/25/2024   Medicare Annual Wellness  12/25/2024   BUN  12/25/2024   DTaP/Tdap/Td Vaccines (2 - Td or Tdap) 05/16/2027   Zoster Vaccine  Completed   Pneumococcal Vaccine: 50+ Years  Completed   RSV Adult and Pregnancy  Completed   Meningococcal B Vaccine  Aged Out   Hepatitis A Vaccine  Aged Out   Meningococcal Conjugate Vaccine  Aged Out   Colorectal Cancer Screening  Discontinued    Roster attribution reviewed and is noted to be incorrect. Instructed patient to contact insurance provider to notify them of inaccuracy.    PCP Attribution Review: PCP updated to correct [facility] Provider. Epic Care Team updated.  Chart Routed : PCP Review Needed - No  Based on chart Review of past appointments and most recent insurance card patients attributed PCP of Kip A Corrington, MD is accurate.  Patient also sees the [facility] [place].

## 2024-07-02 ENCOUNTER — Encounter (HOSPITAL_COMMUNITY): Payer: Self-pay

## 2024-07-02 ENCOUNTER — Emergency Department (HOSPITAL_COMMUNITY)

## 2024-07-02 ENCOUNTER — Other Ambulatory Visit: Payer: Self-pay

## 2024-07-02 ENCOUNTER — Inpatient Hospital Stay (HOSPITAL_COMMUNITY)
Admission: EM | Admit: 2024-07-02 | Discharge: 2024-07-04 | DRG: 398 | Disposition: A | Discharge status: Home / Self Care | Attending: Internal Medicine | Admitting: Internal Medicine

## 2024-07-02 DIAGNOSIS — G4733 Obstructive sleep apnea (adult) (pediatric): Secondary | ICD-10-CM | POA: Diagnosis present

## 2024-07-02 DIAGNOSIS — K529 Noninfective gastroenteritis and colitis, unspecified: Secondary | ICD-10-CM | POA: Diagnosis present

## 2024-07-02 DIAGNOSIS — M810 Age-related osteoporosis without current pathological fracture: Secondary | ICD-10-CM | POA: Diagnosis present

## 2024-07-02 DIAGNOSIS — K922 Gastrointestinal hemorrhage, unspecified: Secondary | ICD-10-CM

## 2024-07-02 DIAGNOSIS — G603 Idiopathic progressive neuropathy: Secondary | ICD-10-CM | POA: Diagnosis not present

## 2024-07-02 DIAGNOSIS — Z604 Social exclusion and rejection: Secondary | ICD-10-CM | POA: Diagnosis present

## 2024-07-02 DIAGNOSIS — I10 Essential (primary) hypertension: Secondary | ICD-10-CM | POA: Diagnosis not present

## 2024-07-02 DIAGNOSIS — K5909 Other constipation: Secondary | ICD-10-CM | POA: Diagnosis present

## 2024-07-02 DIAGNOSIS — K660 Peritoneal adhesions (postprocedural) (postinfection): Secondary | ICD-10-CM | POA: Diagnosis present

## 2024-07-02 DIAGNOSIS — D62 Acute posthemorrhagic anemia: Secondary | ICD-10-CM | POA: Diagnosis present

## 2024-07-02 DIAGNOSIS — K3580 Unspecified acute appendicitis: Secondary | ICD-10-CM | POA: Diagnosis present

## 2024-07-02 DIAGNOSIS — E872 Acidosis, unspecified: Secondary | ICD-10-CM | POA: Diagnosis present

## 2024-07-02 DIAGNOSIS — T402X5A Adverse effect of other opioids, initial encounter: Secondary | ICD-10-CM | POA: Diagnosis present

## 2024-07-02 DIAGNOSIS — G629 Polyneuropathy, unspecified: Secondary | ICD-10-CM | POA: Diagnosis present

## 2024-07-02 DIAGNOSIS — Z8249 Family history of ischemic heart disease and other diseases of the circulatory system: Secondary | ICD-10-CM

## 2024-07-02 DIAGNOSIS — R935 Abnormal findings on diagnostic imaging of other abdominal regions, including retroperitoneum: Secondary | ICD-10-CM | POA: Diagnosis present

## 2024-07-02 DIAGNOSIS — D509 Iron deficiency anemia, unspecified: Secondary | ICD-10-CM | POA: Diagnosis present

## 2024-07-02 DIAGNOSIS — R296 Repeated falls: Secondary | ICD-10-CM | POA: Diagnosis present

## 2024-07-02 DIAGNOSIS — Z885 Allergy status to narcotic agent status: Secondary | ICD-10-CM

## 2024-07-02 DIAGNOSIS — G894 Chronic pain syndrome: Secondary | ICD-10-CM | POA: Diagnosis present

## 2024-07-02 DIAGNOSIS — I129 Hypertensive chronic kidney disease with stage 1 through stage 4 chronic kidney disease, or unspecified chronic kidney disease: Secondary | ICD-10-CM | POA: Diagnosis present

## 2024-07-02 DIAGNOSIS — K37 Unspecified appendicitis: Secondary | ICD-10-CM

## 2024-07-02 DIAGNOSIS — W19XXXA Unspecified fall, initial encounter: Secondary | ICD-10-CM | POA: Diagnosis not present

## 2024-07-02 DIAGNOSIS — K6389 Other specified diseases of intestine: Secondary | ICD-10-CM

## 2024-07-02 DIAGNOSIS — Z66 Do not resuscitate: Secondary | ICD-10-CM | POA: Diagnosis present

## 2024-07-02 DIAGNOSIS — Z96651 Presence of right artificial knee joint: Secondary | ICD-10-CM | POA: Diagnosis present

## 2024-07-02 DIAGNOSIS — Z993 Dependence on wheelchair: Secondary | ICD-10-CM

## 2024-07-02 DIAGNOSIS — M450 Ankylosing spondylitis of multiple sites in spine: Secondary | ICD-10-CM | POA: Diagnosis present

## 2024-07-02 DIAGNOSIS — K5903 Drug induced constipation: Secondary | ICD-10-CM | POA: Diagnosis present

## 2024-07-02 DIAGNOSIS — F32A Depression, unspecified: Secondary | ICD-10-CM | POA: Diagnosis present

## 2024-07-02 DIAGNOSIS — N1831 Chronic kidney disease, stage 3a: Secondary | ICD-10-CM | POA: Diagnosis present

## 2024-07-02 DIAGNOSIS — N189 Chronic kidney disease, unspecified: Secondary | ICD-10-CM | POA: Diagnosis not present

## 2024-07-02 DIAGNOSIS — N179 Acute kidney failure, unspecified: Secondary | ICD-10-CM | POA: Diagnosis present

## 2024-07-02 DIAGNOSIS — Z79891 Long term (current) use of opiate analgesic: Secondary | ICD-10-CM | POA: Diagnosis not present

## 2024-07-02 DIAGNOSIS — K353 Acute appendicitis with localized peritonitis, without perforation or gangrene: Secondary | ICD-10-CM | POA: Diagnosis not present

## 2024-07-02 LAB — COMPREHENSIVE METABOLIC PANEL WITH GFR
ALT: 22 U/L (ref 0–44)
AST: 26 U/L (ref 15–41)
Albumin: 3.9 g/dL (ref 3.5–5.0)
Alkaline Phosphatase: 105 U/L (ref 38–126)
Anion gap: 18 — ABNORMAL HIGH (ref 5–15)
BUN: 27 mg/dL — ABNORMAL HIGH (ref 8–23)
CO2: 18 mmol/L — ABNORMAL LOW (ref 22–32)
Calcium: 9.2 mg/dL (ref 8.9–10.3)
Chloride: 103 mmol/L (ref 98–111)
Creatinine, Ser: 1.6 mg/dL — ABNORMAL HIGH (ref 0.61–1.24)
GFR, Estimated: 44 mL/min — ABNORMAL LOW
Glucose, Bld: 113 mg/dL — ABNORMAL HIGH (ref 70–99)
Potassium: 4.5 mmol/L (ref 3.5–5.1)
Sodium: 139 mmol/L (ref 135–145)
Total Bilirubin: 0.8 mg/dL (ref 0.0–1.2)
Total Protein: 7.8 g/dL (ref 6.5–8.1)

## 2024-07-02 LAB — POC OCCULT BLOOD, ED: Positive: POSITIVE

## 2024-07-02 LAB — MAGNESIUM: Magnesium: 2 mg/dL (ref 1.7–2.4)

## 2024-07-02 LAB — CBC
HCT: 31 % — ABNORMAL LOW (ref 39.0–52.0)
Hemoglobin: 8.9 g/dL — ABNORMAL LOW (ref 13.0–17.0)
MCH: 22.6 pg — ABNORMAL LOW (ref 26.0–34.0)
MCHC: 28.7 g/dL — ABNORMAL LOW (ref 30.0–36.0)
MCV: 78.7 fL — ABNORMAL LOW (ref 80.0–100.0)
Platelets: 470 K/uL — ABNORMAL HIGH (ref 150–400)
RBC: 3.94 MIL/uL — ABNORMAL LOW (ref 4.22–5.81)
RDW: 17.5 % — ABNORMAL HIGH (ref 11.5–15.5)
WBC: 18.3 K/uL — ABNORMAL HIGH (ref 4.0–10.5)
nRBC: 0 % (ref 0.0–0.2)

## 2024-07-02 LAB — FOLATE: Folate: >20 ng/mL

## 2024-07-02 LAB — RETICULOCYTES
Immature Retic Fract: 29.3 % — ABNORMAL HIGH (ref 2.3–15.9)
RBC.: 3.88 MIL/uL — ABNORMAL LOW (ref 4.22–5.81)
Retic Count, Absolute: 83.8 K/uL (ref 19.0–186.0)
Retic Ct Pct: 2.2 % (ref 0.4–3.1)

## 2024-07-02 LAB — VITAMIN B12: Vitamin B-12: 674 pg/mL (ref 180–914)

## 2024-07-02 LAB — FERRITIN: Ferritin: 33 ng/mL (ref 24–336)

## 2024-07-02 LAB — PROTIME-INR
INR: 1.1 (ref 0.8–1.2)
Prothrombin Time: 15.1 s (ref 11.4–15.2)

## 2024-07-02 LAB — IRON AND TIBC
Iron: 12 ug/dL — ABNORMAL LOW (ref 45–182)
Saturation Ratios: 3 % — ABNORMAL LOW (ref 17.9–39.5)
TIBC: 377 ug/dL (ref 250–450)
UIBC: 364 ug/dL

## 2024-07-02 LAB — CK: Total CK: 176 U/L (ref 49–397)

## 2024-07-02 LAB — TROPONIN T, HIGH SENSITIVITY: Troponin T High Sensitivity: 33 ng/L — ABNORMAL HIGH (ref 0–19)

## 2024-07-02 LAB — LACTIC ACID, PLASMA: Lactic Acid, Venous: 2.1 mmol/L (ref 0.5–1.9)

## 2024-07-02 LAB — CBG MONITORING, ED: Glucose-Capillary: 113 mg/dL — ABNORMAL HIGH (ref 70–99)

## 2024-07-02 MED ORDER — LACTATED RINGERS IV SOLN: INTRAVENOUS | Status: DC

## 2024-07-02 MED ORDER — PIPERACILLIN-TAZOBACTAM 3.375 G IVPB 30 MIN
3.3750 g | Freq: Once | INTRAVENOUS | Status: AC
  Administered 2024-07-02: 3.375 g via INTRAVENOUS
  Filled 2024-07-02: qty 50

## 2024-07-02 MED ORDER — OXYCODONE HCL 5 MG PO TABS
5.0000 mg | ORAL_TABLET | Freq: Four times a day (QID) | ORAL | Status: DC | PRN
  Administered 2024-07-02: 5 mg via ORAL
  Filled 2024-07-02: qty 1

## 2024-07-02 MED ORDER — HYDRALAZINE HCL 20 MG/ML IJ SOLN: 10.0000 mg | Freq: Four times a day (QID) | INTRAMUSCULAR | Status: DC | PRN

## 2024-07-02 MED ORDER — OXYCODONE-ACETAMINOPHEN 10-325 MG PO TABS: 1.0000 | ORAL_TABLET | Freq: Four times a day (QID) | ORAL | Status: DC | PRN

## 2024-07-02 MED ORDER — TRAMADOL HCL 50 MG PO TABS: 50.0000 mg | ORAL_TABLET | Freq: Three times a day (TID) | ORAL | Status: DC | PRN

## 2024-07-02 MED ORDER — TRAZODONE HCL 50 MG PO TABS: 50.0000 mg | ORAL_TABLET | Freq: Every evening | ORAL | Status: DC | PRN

## 2024-07-02 MED ORDER — PIPERACILLIN-TAZOBACTAM 3.375 G IVPB
3.3750 g | Freq: Three times a day (TID) | INTRAVENOUS | Status: DC
  Administered 2024-07-03 – 2024-07-04 (×5): 3.375 g via INTRAVENOUS
  Filled 2024-07-02 (×6): qty 50

## 2024-07-02 MED ORDER — LACTATED RINGERS IV BOLUS
500.0000 mL | Freq: Once | INTRAVENOUS | Status: AC
  Administered 2024-07-02: 500 mL via INTRAVENOUS

## 2024-07-02 MED ORDER — FENTANYL CITRATE (PF) 100 MCG/2ML IJ SOLN: 50.0000 ug | INTRAMUSCULAR | Status: DC | PRN

## 2024-07-02 MED ORDER — DULOXETINE HCL 60 MG PO CPEP
60.0000 mg | ORAL_CAPSULE | Freq: Every day | ORAL | Status: DC
  Administered 2024-07-02 – 2024-07-04 (×3): 60 mg via ORAL
  Filled 2024-07-02 (×3): qty 1

## 2024-07-02 MED ORDER — PREGABALIN 75 MG PO CAPS
200.0000 mg | ORAL_CAPSULE | Freq: Three times a day (TID) | ORAL | Status: DC
  Administered 2024-07-02 – 2024-07-04 (×4): 200 mg via ORAL
  Filled 2024-07-02 (×4): qty 1

## 2024-07-02 MED ORDER — NALOXEGOL OXALATE 12.5 MG PO TABS
12.5000 mg | ORAL_TABLET | ORAL | Status: DC
  Administered 2024-07-03: 12.5 mg via ORAL
  Filled 2024-07-02 (×2): qty 1

## 2024-07-02 MED ORDER — HYDROMORPHONE HCL 1 MG/ML IJ SOLN: 0.5000 mg | INTRAMUSCULAR | Status: DC | PRN

## 2024-07-02 MED ORDER — OXYCODONE-ACETAMINOPHEN 5-325 MG PO TABS
1.0000 | ORAL_TABLET | Freq: Once | ORAL | Status: AC
  Administered 2024-07-02: 1 via ORAL
  Filled 2024-07-02: qty 1

## 2024-07-02 MED ORDER — ALBUTEROL SULFATE (2.5 MG/3ML) 0.083% IN NEBU: 2.5000 mg | INHALATION_SOLUTION | RESPIRATORY_TRACT | Status: DC | PRN

## 2024-07-02 MED ORDER — OXYCODONE-ACETAMINOPHEN 5-325 MG PO TABS
1.0000 | ORAL_TABLET | Freq: Four times a day (QID) | ORAL | Status: DC | PRN
  Administered 2024-07-02: 1 via ORAL
  Filled 2024-07-02: qty 1

## 2024-07-02 NOTE — ED Provider Notes (Signed)
 " [place] EMERGENCY DEPARTMENT AT [facility] Provider Note   CSN: [id number] Arrival date & time: 07/02/24  1055     Patient presents with: [name] is a 78 y.o. male.   HPI Patient presents after a fall.  Medical history includes chronic pain, OSA, HTN, peripheral neuropathy.  He is wheelchair-bound at baseline.  He was told that this is due to advanced neuropathy.  He lives at home with his wife.  Sometime last night, patient had a fall in the bathroom.  He does not remember the fall.  It is suspected that it was during his transfer from wheelchair to potty chair.  He does remember laying on the floor with his right leg pinned against the wall and his wheelchair pinned against his leg.  He remained there for several hours until his wife found him and called EMS.  [name] is soreness throughout his body, which he attributes to laying on the floor.  He has some mild pain in the lateral aspect of his right knee.    Prior to Admission medications  Medication Sig Start Date End Date Taking? Authorizing Provider  acetaminophen  (TYLENOL ) 325 MG tablet Take 2 tablets (650 mg total) by mouth every 4 (four) hours as needed for mild pain (or temp > 37.5 C (99.5 F)). 09/29/19  Yes Emokpae, Courage, MD  amLODipine  (NORVASC ) 10 MG tablet Take 10 mg by mouth daily.   Yes [provider]  bacitracin ointment Apply 1 Application topically 2 (two) times daily as needed for wound care.   Yes [provider]  DULoxetine  (CYMBALTA ) 30 MG capsule Take 60 mg by mouth daily.   Yes [provider]  losartan  (COZAAR ) 25 MG tablet Take 1 tablet (25 mg total) by mouth daily. Patient taking differently: Take 12.5 mg by mouth daily. 09/29/19  Yes Emokpae, Courage, MD  naloxegol oxalate (MOVANTIK) 25 MG TABS tablet Take 12.5 mg by mouth daily.   Yes [provider]  naloxone  (NARCAN ) nasal spray 4 mg/0.1 mL Place 1 spray into the nose once.   Yes  [provider]  oxyCODONE -acetaminophen  (PERCOCET) 10-325 MG tablet Take 1 tablet by mouth every 6 (six) hours as needed for pain. 09/09/19  Yes [provider]  patiromer (VELTASSA) 8.4 g packet Take 8.4 g by mouth daily. Take 3 hours before or after other medication   Yes [provider]  pregabalin  (LYRICA ) 150 MG capsule Take 200 mg by mouth 3 (three) times daily.   Yes [provider]  tamsulosin  (FLOMAX ) 0.4 MG CAPS capsule Take 1 capsule (0.4 mg total) by mouth at bedtime. Patient not taking: Reported on 07/02/2024 09/29/19   Pearlean Manus, MD    Allergies: Codeine    Review of Systems  Musculoskeletal:  Positive for arthralgias and myalgias.  All other systems reviewed and are negative.   Updated Vital Signs BP 136/65   Pulse 76   Temp 98.5 F (36.9 C) (Oral)   Resp 16   SpO2 95%   Physical Exam Vitals and nursing note reviewed.  Constitutional:      General: He is not in acute distress.    Appearance: Normal appearance. He is well-developed. He is not ill-appearing, toxic-appearing or diaphoretic.  HENT:     Head: Normocephalic and atraumatic.     Right Ear: External ear normal.     Left Ear: External ear normal.     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    Nose: Nose normal.     Mouth/Throat:     Mouth: Mucous membranes are moist.  Eyes:     Extraocular Movements: Extraocular movements intact.     Conjunctiva/sclera: Conjunctivae normal.  Cardiovascular:     Rate and Rhythm: Normal rate and regular rhythm.     Heart sounds: No murmur heard. Pulmonary:     Effort: Pulmonary effort is normal. No respiratory distress.     Breath sounds: Normal breath sounds. No wheezing or rales.  Chest:     Chest wall: No tenderness.  Abdominal:     Palpations: Abdomen is soft.     Tenderness: There is no abdominal tenderness.  Musculoskeletal:        General: No swelling or deformity.      Cervical back: Normal range of motion and neck supple.  Skin:    General: Skin is warm and dry.     Coloration: Skin is not jaundiced or pale.  Neurological:     General: No focal deficit present.     Mental Status: He is alert and oriented to person, place, and time.  Psychiatric:        Mood and Affect: Mood normal.        Behavior: Behavior normal.     (all labs ordered are listed, but only abnormal results are displayed) Labs Reviewed  COMPREHENSIVE METABOLIC PANEL WITH GFR - Abnormal; Notable for the following components:      Result Value   CO2 18 (*)    Glucose, Bld 113 (*)    BUN 27 (*)    Creatinine, Ser 1.60 (*)    GFR, Estimated 44 (*)    Anion gap 18 (*)    All other components within normal limits  CBC - Abnormal; Notable for the following components:   WBC 18.3 (*)    RBC 3.94 (*)    Hemoglobin 8.9 (*)    HCT 31.0 (*)    MCV 78.7 (*)    MCH 22.6 (*)    MCHC 28.7 (*)    RDW 17.5 (*)    Platelets 470 (*)    All other components within normal limits  LACTIC ACID, PLASMA - Abnormal; Notable for the following components:   Lactic Acid, Venous 2.1 (*)    All other components within normal limits  CBG MONITORING, ED - Abnormal; Notable for the following components:   Glucose-Capillary 113 (*)    All other components within normal limits  TROPONIN T, HIGH SENSITIVITY - Abnormal; Notable for the following components:   Troponin T High Sensitivity 33 (*)    All other components within normal limits  PROTIME-INR  MAGNESIUM   CK  URINALYSIS, ROUTINE W REFLEX MICROSCOPIC  POC OCCULT BLOOD, ED    EKG: EKG Interpretation Date/Time:  Tuesday July 02 2024 11:27:59 EST Ventricular Rate:  91 PR Interval:  228 QRS Duration:  102 QT Interval:  354 QTC Calculation: 436 R Axis:   39  Text Interpretation: Sinus or ectopic atrial rhythm Prolonged PR interval Low voltage, precordial leads Confirmed by Melvenia Motto ([id number]) on 07/02/2024 12:13:21 PM  Radiology: CT  L-SPINE NO CHARGE Result Date: 07/02/2024 EXAM: CT OF THE LUMBAR SPINE WITHOUT CONTRAST 07/02/2024 01:48:17 PM TECHNIQUE: CT of the lumbar spine was performed without the administration of intravenous contrast. Multiplanar reformatted images are provided for review. Automated exposure control, iterative reconstruction, and/or weight based adjustment of the mA/kV was utilized to  reduce the radiation dose to as low as reasonably achievable. COMPARISON: Of the lumbar spine 08/11/2020 CLINICAL HISTORY: FINDINGS: BONES AND ALIGNMENT: Normal vertebral body heights. No acute fracture or suspicious bone lesion. Normal alignment. Multilevel bulky anterior osteophyte formation. DEGENERATIVE CHANGES: L5-S1 intervertebral disc space narrowing. Posterior disc osteophyte complex at the L5-S1 level. At least moderate left L5-S1 osseous neural foraminal stenosis. SOFT TISSUES: No acute abnormality. IMPRESSION: 1. No acute fracture. Electronically signed by: Morgane Naveau MD 07/02/2024 02:36 PM EST RP Workstation: [id number]   CT T-SPINE NO CHARGE Result Date: 07/02/2024 EXAM: CT THORACIC SPINE WITHOUT CONTRAST 07/02/2024 01:48:17 PM TECHNIQUE: CT of the thoracic spine was performed without the administration of intravenous contrast. Multiplanar reformatted images are provided for review. Automated exposure control, iterative reconstruction, and/or weight based adjustment of the mA/kV was utilized to reduce the radiation dose to as low as reasonably achievable. COMPARISON: CT chest 10/07/2019. CLINICAL HISTORY: FINDINGS: BONES AND ALIGNMENT: Redemonstration of exaggerating kyphotic curvature centered at the thoracic spine T7 level due to chronic compression fracture. T7 compression fracture is chronic, status post kyphoplasty. T6 and T10 chronic fracture with kyphoplasty. No acute fracture identified. No suspicious bone lesion. DEGENERATIVE CHANGES: Multilevel severe degenerative changes of the spine with bulky anterior  contiguous osteophytes. Multilevel mild intervertebral disc space narrowing. Severe osseous neural foraminal stenosis at the T7-T8 levels. At least moderate osseous central canal stenosis at the T7 level. SOFT TISSUES: No acute abnormality. IMPRESSION: 1. No acute thoracic spine fracture. 2. Severe osseous neural foraminal stenosis at the T7-T8 levels. 3. At least moderate osseous central canal stenosis at the T7 level. Electronically signed by: Morgane Naveau MD 07/02/2024 02:19 PM EST RP Workstation: [id number]   CT CHEST ABDOMEN PELVIS WO CONTRAST Result Date: 07/02/2024 EXAM: CT CHEST, ABDOMEN AND PELVIS WITHOUT CONTRAST 07/02/2024 01:48:17 PM TECHNIQUE: CT of the chest, abdomen and pelvis was performed without the administration of intravenous contrast. Multiplanar reformatted images are provided for review. Automated exposure control, iterative reconstruction, and/or weight based adjustment of the mA/kV was utilized to reduce the radiation dose to as low as reasonably achievable. COMPARISON: None available. CLINICAL HISTORY: Polytrauma, blunt. FINDINGS: CHEST: MEDIASTINUM AND LYMPH NODES: Heart and pericardium are unremarkable. The central airways are clear. No mediastinal, hilar or axillary lymphadenopathy. The main pulmonary artery is enlarged with a caliber measuring up to 3.4 cm. At least 3-vessel coronary artery calcifications. The esophagus is unremarkable. LUNGS AND PLEURA: No focal consolidation or pulmonary edema. No pleural effusion or pneumothorax. ABDOMEN AND PELVIS: LIVER: The liver is unremarkable. GALLBLADDER AND BILE DUCTS: Status post cholecystectomy. No biliary ductal dilatation. SPLEEN: No acute abnormality. PANCREAS: No acute abnormality. ADRENAL GLANDS: No acute abnormality. KIDNEYS, URETERS AND BLADDER: Bilateral renal cortical scarring. No stones in the kidneys or ureters. No hydronephrosis. No perinephric or periureteral stranding. Urinary bladder is unremarkable. GI AND BOWEL:  Stomach demonstrates no acute abnormality. No small bowel thickening or dilatation. The appendix is enlarged in caliber, measuring up to 1 cm, with associated appendiceal wall thickening. Trace periappendiceal fat stranding. Irregular approximately 15 cm in length bowel thickening of the hepatic flexure suggestive of malignancy. Associated pericolonic fat stranding and prominent benign enlarged pericolonic lymph nodes. There is no bowel obstruction. REPRODUCTIVE ORGANS: No acute abnormality. PERITONEUM AND RETROPERITONEUM: No ascites. No free air. VASCULATURE: Aorta is normal in caliber with mild to moderate atherosclerotic plaque. ABDOMINAL AND PELVIS LYMPH NODES: Prominent benign enlarged pericolonic lymph nodes associated with the hepatic flexure bowel thickening. REPRODUCTIVE ORGANS: No acute abnormality. BONES AND  SOFT TISSUES: No acute displaced rib fracture. Old healed right posterior rib fractures. Old healed posterior left rib fractures. Old healed anterior left rib fractures. No acute displaced fracture or dislocation of the partially visualized bilateral shoulders. No acute displaced fracture of the partially visualized clavicles and scapulae. No acute fracture or dislocation of either hip. No acute displaced fracture or diastasis of the bones of the pelvis. No acute displaced fracture of the sacrum. Please see separately dictated CT thoracolumbar spine 07/02/2024. Degenerative changes of the bilateral hips. Neurostimulator back within the right flank soft tissues with fistulae coursing along the posterior canal and terminating at the T6 to T8 levels. No large soft tissue hematoma. IMPRESSION: 1. Long segment wall thickening of the hepatic flexure with prominent but not enlarged pericolonic fat stranding suggestive of malignancy. Recommend GI consultation. 2. Enlarged appendiceal caliber measuring up to 10 mm with associated mild pericolonic fat stranding. Findings may represent acute appendicitis versus  less likely malignancy involvement. 3. No intrathoracic, intra-abdominal, intrapelvic traumatic injury with limited evaluation of this noncontrast study. 4. No acute displaced fracture. Electronically signed by: Morgane Naveau MD 07/02/2024 02:12 PM EST RP Workstation: [id number]   CT CERVICAL SPINE WO CONTRAST Result Date: 07/02/2024 EXAM: CT CERVICAL SPINE WITHOUT CONTRAST 07/02/2024 01:48:17 PM TECHNIQUE: CT of the cervical spine was performed without the administration of intravenous contrast. Multiplanar reformatted images are provided for review. Automated exposure control, iterative reconstruction, and/or weight based adjustment of the mA/kV was utilized to reduce the radiation dose to as low as reasonably achievable. COMPARISON: None available. CLINICAL HISTORY: Polytrauma, blunt. FINDINGS: BONES AND ALIGNMENT: Occiput to the C4 level posterior surgical hardware. No CT findings to suggest surgical hardware complication. No acute fracture or traumatic malalignment. No severe osseous or foraminal or central canal stenosis. DEGENERATIVE CHANGES: Multilevel bulky anterior osteophyte formation that appears to be contiguous. Mild multilevel intervertebral disc space narrowing and calcification. SOFT TISSUES: No prevertebral soft tissue swelling. VASCULATURE: Atherosclerotic plaque of the aorta. IMPRESSION: 1. No evidence of acute traumatic injury. 2. Occiput to the C4 level posterior surgical hardware without CT evidence of complication. 3. 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 Electronically signed by: Morgane Naveau MD 07/02/2024 01:56 PM EST RP Workstation: [id number]   CT HEAD WO CONTRAST Result Date: 07/02/2024 EXAM: CT HEAD WITHOUT CONTRAST 07/02/2024 01:48:17 PM TECHNIQUE: CT of the head was performed without the administration of intravenous contrast. Automated exposure control, iterative reconstruction, and/or weight based adjustment of the mA/kV was utilized to  reduce the radiation dose to as low as reasonably achievable. COMPARISON: CT head 09/29/2019. CLINICAL HISTORY: Head trauma, moderate-severe. FINDINGS: BRAIN AND VENTRICLES: No acute hemorrhage. No evidence of acute infarct. Nonspecific periventricular and subcortical white matter hypoattenuation, favored to reflect chronic microvascular ischemic changes. Cerebral ventricle sizes are concordant with the degree of cerebral volume loss. No extra-axial collection. No mass effect or midline shift. Limited evaluation of the cerebellum. ORBITS: Bilateral lens replacement. SINUSES: Mucous retention cyst. SOFT TISSUES AND SKULL: Postoperative changes with fixation hardware along occiput. No CT findings to suggest surgical hardware complication. Fracture originating from the surgical hardware. No acute soft tissue abnormality. VASCULATURE: Atherosclerotic calcifications of carotid siphons and intracranial vertebral arteries. IMPRESSION: 1. No acute intracranial abnormality related to head trauma. 2. Postoperative changes with fixation hardware along the occiput without CT findings to suggest surgical hardware complication. Electronically signed by: Morgane Naveau MD 07/02/2024 01:54 PM EST RP Workstation: [id number]   DG Knee Right Port Result Date: 07/02/2024 EXAM: 1  OR 2 VIEW(S) XRAY OF THE RIGHT KNEE 07/02/2024 11:50:00 AM COMPARISON: None available. 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 No acute fracture. No malalignment. No significant joint effusion. SOFT TISSUES: Vascular calcifications are noted. IMPRESSION: 1. No evidence of acute traumatic injury. 2. 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 Electronically signed by: Morgane Naveau MD 07/02/2024 01:37 PM EST RP Workstation: [id number]   DG Hip Unilat W or Wo Pelvis 2-3 Views Right Result Date: 07/02/2024 EXAM: 2 or more VIEW(S) XRAY OF THE UNILATERAL HIP 07/02/2024 11:50:00 AM COMPARISON: CT abdomen and  pelvis 07/02/2024. CLINICAL HISTORY: fall FINDINGS: BONES AND JOINTS: No acute displaced fracture or dislocation of either hip. No acute displaced fracture or dislocation of the pelvis. 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 LUMBAR SPINE: DISCS/DEGENERATIVE CHANGES: Degenerative changes in visualized lower lumbar spine. SOFT TISSUES: Transcutaneous neurostimulator device partially visualized in place. Bilateral pelvic phleboliths. IMPRESSION: 1. No acute findings. Electronically signed by: Morgane Naveau MD 07/02/2024 01:37 PM EST RP Workstation: [id number]     Procedures   Medications Ordered in the ED  fentaNYL  (SUBLIMAZE ) injection 50 mcg (has no administration in time range)  piperacillin -tazobactam (ZOSYN ) IVPB 3.375 g (has no administration in time range)  oxyCODONE -acetaminophen  (PERCOCET/ROXICET) 5-325 MG per tablet 1 tablet (1 tablet Oral Given 07/02/24 1132)  lactated ringers  bolus 500 mL (0 mLs Intravenous Stopped 07/02/24 1346)                                    Medical Decision Making Amount and/or Complexity of Data Reviewed Labs: ordered. Radiology: ordered.  Risk Prescription drug management. Decision regarding hospitalization.   This patient presents to the ED for concern of fall, this involves an extensive number of treatment options, and is a complaint that carries with it a high risk of complications and morbidity.  The differential diagnosis includes acute injuries   Co morbidities / Chronic conditions that complicate the patient evaluation  chronic pain, OSA, HTN, peripheral neuropathy   Additional history obtained:  Additional history obtained from EMR External records from outside source obtained and reviewed including EMS, patient's wife   Lab Tests:  I Ordered, and personally interpreted labs.  The pertinent results include: Leukocytosis is present.  Hemoglobin has decreased when compared to lab work from a year ago.  Creatinine is baseline.   Electrolytes are normal.  Anion gap metabolic acidosis is present.     Imaging Studies ordered:  I ordered imaging studies including x-ray of right knee and right hip; CT of head, cervical spine, chest, abdomen, pelvis, T-spine, L-spine I independently visualized and interpreted imaging which showed no acute injuries.  CT of abdomen did show colonic wall thickening at area of hepatic flexure without stranding, suggestive of malignancy; there is an enlarged appendiceal caliber with mild associated stranding concerning for possible appendicitis versus malignancy involvement. I agree with the radiologist interpretation   Cardiac Monitoring: / EKG:  The patient was maintained on a cardiac monitor.  I personally viewed and interpreted the cardiac monitored which showed an underlying rhythm of: Sinus rhythm   Problem List / ED Course / Critical interventions / Medication management  Patient presenting after unwitnessed fall at home and prolonged downtime on ground in his bathroom.  He was found by his wife there this morning and wife called EMS.  On arrival in the ED, patient is alert and oriented.  Although he  does not remember the fall, he does remember laying on the ground for several hours.  He endorses diffuse myalgias.  He endorses some pain in his right knee but no wounds, erythema, swelling present.  He is able to move all extremities.  He has baseline weakness in his lower extremities but he is able to flex his knees and hips.  He does not have any focal neurologic deficits.  Percocet was ordered for analgesia.  Workup was initiated.  Lab work is notable for leukocytosis and decreased hemoglobin from baseline.  Year ago, hemoglobin was in the range of 12.  Today it is 8.9.  He denies any known sources of blood loss.  On DRE, he does have brown stool that is Hemoccult positive.  His CT scan shows concern of possible malignancy in hepatic flexure of colon in addition to enlarged appendix caliber.   He does have right lower quadrant tenderness.  Zosyn  was ordered.  I spoke with gastroenterologist on-call, Dr. Cinderella, who will see in consult.  I spoke with general surgeon on-call, Dr. Evonnie, who reviewed imaging.  She recommends conservative management of possible appendicitis for now with IV antibiotics and admission to hospitalist.  Her concern is that remaining appendix will limit his ability to undergo colonoscopy.  I informed GI of surgeries concerns.  Surgery will see, likely tomorrow.  Patient was admitted for further management. I ordered medication including fluids for hydration, Percocet for analgesia, Zosyn  for appendicitis Reevaluation of the patient after these medicines showed that the patient improved I have reviewed the patients home medicines and have made adjustments as needed   Consultations Obtained:  I requested consultation with the urologist, Dr. Cinderella,  and discussed lab and imaging findings as well as pertinent plan - they recommend: Will see in consult I requested consultation with the general surgeon, Dr. Evonnie,  and discussed lab and imaging findings as well as pertinent plan - they recommend: Conservative management with antibiotics and hospitalist observation for now.  Will see in consult.   Social Determinants of Health:  Lives at home with wife, wheelchair-bound at baseline     Final diagnoses:  Fall, initial encounter  Gastrointestinal hemorrhage, unspecified gastrointestinal hemorrhage type    ED Discharge Orders     None          Melvenia Motto, MD 07/02/24 [phone]  "

## 2024-07-02 NOTE — ED Notes (Signed)
 Taken to ct

## 2024-07-02 NOTE — H&P (Signed)
 " History and Physical    Patient: [name] FMW:[id number] DOB: Jan 23, 1946 DOA: 07/02/2024 DOS: the patient was seen and examined on 07/02/2024 PCP: Volanda Saupe, MD  Patient coming from: Home  Chief Complaint:  Chief Complaint  Patient presents with   Fall    HPI: [name] is a 78 y.o. male with history of HTN, OSA on CPAP, CKD 3A, osteoporosis with multiple fractures from falls, wheelchair-bound, chronic pain on chronic opioid therapy, presenting after being found down at home by his wife.  Patient and wife in the room corroborating HPI.  Portable patient did not remember falling down and was unable to get up off the floor.  Was conversant but weak.  Denied any fever, shortness of breath, chest pain, nausea, vomiting, abdominal pain, diarrhea.  Mostly concern for fractures after his fall.  Upon evaluation in the emergency department, patient did admit to some right sided abdominal pain.  Patient cannot remember when his last colonoscopy was.  Has had upper endoscopy as recent as 2020 as well as history of lap chole in 2018.  Upon evaluation emergency department, WBCs 18.3, hemoglobin 8.9, lactate 2.1, creatinine 1.60, CT imaging noting no fractures however noting long segment wall thickening at the hepatic flexure of the colon concerning for malignancy as well as enlarged appendix with fat stranding.   Review of Systems: As mentioned in the history of present illness. All other systems reviewed and are negative. 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 pt has note from [facility] indicating difficulty   First degree AV block    chronic   Gait instability     Hypertension    Multiple falls    OSA (obstructive sleep apnea)    Pain management    Peripheral axonal neuropathy    Peripheral neuropathy 06/06/2018   Tremor    Past Surgical History:  Procedure Laterality Date   ANKLE SURGERY Right    orif   BACK SURGERY     BIOPSY  11/05/2018   Procedure: BIOPSY;  Surgeon: Harvey Margo CROME, MD;  Location: [facility];  Service: Endoscopy;;   CHOLECYSTECTOMY     ESOPHAGOGASTRODUODENOSCOPY (EGD) WITH PROPOFOL  N/A 11/05/2018   Procedure: ESOPHAGOGASTRODUODENOSCOPY (EGD) WITH PROPOFOL ;  Surgeon: Harvey Margo CROME, MD;  Location: [facility];  Service: Endoscopy;  Laterality: N/A;  10:30am-rescheduled to 4/7 @ 7:30am per office   JOINT REPLACEMENT Bilateral    knees   NECK SURGERY     cervical fusion   SPINAL CORD STIMULATOR INSERTION  2017   TOTAL KNEE ARTHROPLASTY     Social History:  reports that he has never smoked. He has never used smokeless tobacco. He reports current alcohol use. He reports that he does not use drugs.  Allergies[1]  Family History  Problem Relation Age of Onset   Heart disease Mother     Prior to Admission medications  Medication Sig Start Date End Date Taking? Authorizing Provider  acetaminophen  (TYLENOL ) 325 MG tablet Take 2 tablets (650 mg total) by mouth every 4 (four) hours as needed for mild pain (or temp > 37.5 C (99.5 F)). 09/29/19  Yes Pearlean Manus, MD  amLODipine  (NORVASC ) 10 MG tablet Take 10 mg by mouth daily.   Yes [provider]  bacitracin ointment Apply 1 Application topically 2 (two) times daily as needed for wound care.   Yes [provider]  DULoxetine  (CYMBALTA ) 30 MG capsule Take 60 mg by mouth daily.   Yes [provider]  losartan  (COZAAR ) 25 MG tablet Take 1 tablet (25 mg total) by mouth daily. Patient taking differently: Take 12.5 mg by mouth daily. 09/29/19  Yes Emokpae, Courage, MD  naloxegol oxalate (MOVANTIK) 25 MG TABS tablet Take 12.5 mg by mouth daily.   Yes  [provider]  naloxone  (NARCAN ) nasal spray 4 mg/0.1 mL Place 1 spray into the nose once.   Yes [provider]  oxyCODONE -acetaminophen  (PERCOCET) 10-325 MG tablet Take 1 tablet by mouth every 6 (six) hours as needed for pain. 09/09/19  Yes [provider]  patiromer (VELTASSA) 8.4 g packet Take 8.4 g by mouth daily. Take 3 hours before or after other medication   Yes [provider]  pregabalin  (LYRICA ) 150 MG capsule Take 200 mg by mouth 3 (three) times daily.   Yes [provider]  tamsulosin  (FLOMAX ) 0.4 MG CAPS capsule Take 1 capsule (0.4 mg total) by mouth at bedtime. Patient not taking: Reported on 07/02/2024 09/29/19   Pearlean Manus, MD    Physical Exam:  Vitals:   07/02/24 1515 07/02/24 1530 07/02/24 1545 07/02/24 1546  BP:  133/61  133/61  Pulse: 70 69 71   Resp:  (!) 22 16   Temp:    98.8 F (37.1 C)  TempSrc:      SpO2: 93% 95% 95%     GENERAL:  Alert, pleasant, no acute distress, obese HEENT:  EOMI CARDIOVASCULAR:  RRR, no murmurs appreciated RESPIRATORY:  Clear to auscultation, no wheezing, rales, or rhonchi GASTROINTESTINAL:  Soft, moderately tender in right abdominal quadrants, nondistended EXTREMITIES: Mild LE edema bilaterally NEURO: BL LE weakness (chronic) SKIN:  No rashes noted PSYCH:  Appropriate mood and affect    Data Reviewed:  There are no new results to review at this time.  CT L-SPINE NO CHARGE Result Date: 07/02/2024 EXAM: CT OF THE LUMBAR SPINE WITHOUT CONTRAST 07/02/2024 01:48:17 PM TECHNIQUE: CT of the lumbar spine was performed without the administration of intravenous contrast. Multiplanar reformatted images are provided for review. Automated exposure control, iterative reconstruction, and/or weight based adjustment of the mA/kV was utilized to reduce the radiation dose to as low as reasonably achievable. COMPARISON: Of the lumbar spine 08/11/2020 CLINICAL HISTORY: FINDINGS: BONES AND ALIGNMENT:  Normal vertebral body heights. No acute fracture or suspicious bone lesion. Normal alignment. Multilevel bulky anterior osteophyte formation. DEGENERATIVE CHANGES: L5-S1 intervertebral disc space narrowing. Posterior disc osteophyte complex at the L5-S1 level. At least moderate left L5-S1 osseous neural foraminal stenosis. SOFT TISSUES: No acute abnormality. IMPRESSION: 1. No acute fracture. Electronically signed by: Morgane Naveau MD 07/02/2024 02:36 PM EST RP Workstation: [id number]   CT T-SPINE NO CHARGE Result Date: 07/02/2024 EXAM: CT THORACIC SPINE WITHOUT CONTRAST 07/02/2024 01:48:17 PM TECHNIQUE: CT of the thoracic spine was performed without the administration of intravenous contrast. Multiplanar reformatted images are provided for review. Automated exposure control, iterative reconstruction, and/or weight based adjustment of the mA/kV was utilized to reduce the radiation dose to as low as reasonably achievable. COMPARISON: CT chest 10/07/2019. CLINICAL HISTORY: FINDINGS: BONES AND ALIGNMENT: Redemonstration of exaggerating kyphotic curvature centered at the thoracic spine T7 level due to chronic compression fracture. T7 compression  fracture is chronic, status post kyphoplasty. T6 and T10 chronic fracture with kyphoplasty. No acute fracture identified. No suspicious bone lesion. DEGENERATIVE CHANGES: Multilevel severe degenerative changes of the spine with bulky anterior contiguous osteophytes. Multilevel mild intervertebral disc space narrowing. Severe osseous neural foraminal stenosis at the T7-T8 levels. At least moderate osseous central canal stenosis at the T7 level. SOFT TISSUES: No acute abnormality. IMPRESSION: 1. No acute thoracic spine fracture. 2. Severe osseous neural foraminal stenosis at the T7-T8 levels. 3. At least moderate osseous central canal stenosis at the T7 level. Electronically signed by: Morgane Naveau MD 07/02/2024 02:19 PM EST RP Workstation: [id number]   CT CHEST ABDOMEN  PELVIS WO CONTRAST Result Date: 07/02/2024 EXAM: CT CHEST, ABDOMEN AND PELVIS WITHOUT CONTRAST 07/02/2024 01:48:17 PM TECHNIQUE: CT of the chest, abdomen and pelvis was performed without the administration of intravenous contrast. Multiplanar reformatted images are provided for review. Automated exposure control, iterative reconstruction, and/or weight based adjustment of the mA/kV was utilized to reduce the radiation dose to as low as reasonably achievable. COMPARISON: None available. CLINICAL HISTORY: Polytrauma, blunt. FINDINGS: CHEST: MEDIASTINUM AND LYMPH NODES: Heart and pericardium are unremarkable. The central airways are clear. No mediastinal, hilar or axillary lymphadenopathy. The main pulmonary artery is enlarged with a caliber measuring up to 3.4 cm. At least 3-vessel coronary artery calcifications. The esophagus is unremarkable. LUNGS AND PLEURA: No focal consolidation or pulmonary edema. No pleural effusion or pneumothorax. ABDOMEN AND PELVIS: LIVER: The liver is unremarkable. GALLBLADDER AND BILE DUCTS: Status post cholecystectomy. No biliary ductal dilatation. SPLEEN: No acute abnormality. PANCREAS: No acute abnormality. ADRENAL GLANDS: No acute abnormality. KIDNEYS, URETERS AND BLADDER: Bilateral renal cortical scarring. No stones in the kidneys or ureters. No hydronephrosis. No perinephric or periureteral stranding. Urinary bladder is unremarkable. GI AND BOWEL: Stomach demonstrates no acute abnormality. No small bowel thickening or dilatation. The appendix is enlarged in caliber, measuring up to 1 cm, with associated appendiceal wall thickening. Trace periappendiceal fat stranding. Irregular approximately 15 cm in length bowel thickening of the hepatic flexure suggestive of malignancy. Associated pericolonic fat stranding and prominent benign enlarged pericolonic lymph nodes. There is no bowel obstruction. REPRODUCTIVE ORGANS: No acute abnormality. PERITONEUM AND RETROPERITONEUM: No ascites. No  free air. VASCULATURE: Aorta is normal in caliber with mild to moderate atherosclerotic plaque. ABDOMINAL AND PELVIS LYMPH NODES: Prominent benign enlarged pericolonic lymph nodes associated with the hepatic flexure bowel thickening. REPRODUCTIVE ORGANS: No acute abnormality. BONES AND SOFT TISSUES: No acute displaced rib fracture. Old healed right posterior rib fractures. Old healed posterior left rib fractures. Old healed anterior left rib fractures. No acute displaced fracture or dislocation of the partially visualized bilateral shoulders. No acute displaced fracture of the partially visualized clavicles and scapulae. No acute fracture or dislocation of either hip. No acute displaced fracture or diastasis of the bones of the pelvis. No acute displaced fracture of the sacrum. Please see separately dictated CT thoracolumbar spine 07/02/2024. Degenerative changes of the bilateral hips. Neurostimulator back within the right flank soft tissues with fistulae coursing along the posterior canal and terminating at the T6 to T8 levels. No large soft tissue hematoma. IMPRESSION: 1. Long segment wall thickening of the hepatic flexure with prominent but not enlarged pericolonic fat stranding suggestive of malignancy. Recommend GI consultation. 2. Enlarged appendiceal caliber measuring up to 10 mm with associated mild pericolonic fat stranding. Findings may represent acute appendicitis versus less likely malignancy involvement. 3. No intrathoracic, intra-abdominal, intrapelvic traumatic injury with limited evaluation of this noncontrast study.  4. No acute displaced fracture. Electronically signed by: Morgane Naveau MD 07/02/2024 02:12 PM EST RP Workstation: [id number]   CT CERVICAL SPINE WO CONTRAST Result Date: 07/02/2024 EXAM: CT CERVICAL SPINE WITHOUT CONTRAST 07/02/2024 01:48:17 PM TECHNIQUE: CT of the cervical spine was performed without the administration of intravenous contrast. Multiplanar reformatted images are  provided for review. Automated exposure control, iterative reconstruction, and/or weight based adjustment of the mA/kV was utilized to reduce the radiation dose to as low as reasonably achievable. COMPARISON: None available. CLINICAL HISTORY: Polytrauma, blunt. FINDINGS: BONES AND ALIGNMENT: Occiput to the C4 level posterior surgical hardware. No CT findings to suggest surgical hardware complication. No acute fracture or traumatic malalignment. No severe osseous or foraminal or central canal stenosis. DEGENERATIVE CHANGES: Multilevel bulky anterior osteophyte formation that appears to be contiguous. Mild multilevel intervertebral disc space narrowing and calcification. SOFT TISSUES: No prevertebral soft tissue swelling. VASCULATURE: Atherosclerotic plaque of the aorta. IMPRESSION: 1. No evidence of acute traumatic injury. 2. Occiput to the C4 level posterior surgical hardware without CT evidence of complication. 3. 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 Electronically signed by: Morgane Naveau MD 07/02/2024 01:56 PM EST RP Workstation: [id number]   CT HEAD WO CONTRAST Result Date: 07/02/2024 EXAM: CT HEAD WITHOUT CONTRAST 07/02/2024 01:48:17 PM TECHNIQUE: CT of the head was performed without the administration of intravenous contrast. Automated exposure control, iterative reconstruction, and/or weight based adjustment of the mA/kV was utilized to reduce the radiation dose to as low as reasonably achievable. COMPARISON: CT head 09/29/2019. CLINICAL HISTORY: Head trauma, moderate-severe. FINDINGS: BRAIN AND VENTRICLES: No acute hemorrhage. No evidence of acute infarct. Nonspecific periventricular and subcortical white matter hypoattenuation, favored to reflect chronic microvascular ischemic changes. Cerebral ventricle sizes are concordant with the degree of cerebral volume loss. No extra-axial collection. No mass effect or midline shift. Limited evaluation of the cerebellum. ORBITS:  Bilateral lens replacement. SINUSES: Mucous retention cyst. SOFT TISSUES AND SKULL: Postoperative changes with fixation hardware along occiput. No CT findings to suggest surgical hardware complication. Fracture originating from the surgical hardware. No acute soft tissue abnormality. VASCULATURE: Atherosclerotic calcifications of carotid siphons and intracranial vertebral arteries. IMPRESSION: 1. No acute intracranial abnormality related to head trauma. 2. Postoperative changes with fixation hardware along the occiput without CT findings to suggest surgical hardware complication. Electronically signed by: Morgane Naveau MD 07/02/2024 01:54 PM EST RP Workstation: [id number]   DG Knee Right Port Result Date: 07/02/2024 EXAM: 1 OR 2 VIEW(S) XRAY OF THE RIGHT KNEE 07/02/2024 11:50:00 AM COMPARISON: None available. 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 No acute fracture. No malalignment. No significant joint effusion. SOFT TISSUES: Vascular calcifications are noted. IMPRESSION: 1. No evidence of acute traumatic injury. 2. 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 Electronically signed by: Morgane Naveau MD 07/02/2024 01:37 PM EST RP Workstation: [id number]   DG Hip Unilat W or Wo Pelvis 2-3 Views Right Result Date: 07/02/2024 EXAM: 2 or more VIEW(S) XRAY OF THE UNILATERAL HIP 07/02/2024 11:50:00 AM COMPARISON: CT abdomen and pelvis 07/02/2024. CLINICAL HISTORY: fall FINDINGS: BONES AND JOINTS: No acute displaced fracture or dislocation of either hip. No acute displaced fracture or dislocation of the pelvis. 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 LUMBAR SPINE: DISCS/DEGENERATIVE CHANGES: Degenerative changes in visualized lower lumbar spine. SOFT TISSUES: Transcutaneous neurostimulator device partially visualized in place. Bilateral pelvic phleboliths. IMPRESSION: 1. No acute findings. Electronically signed by: Morgane Naveau MD 07/02/2024 01:37 PM EST RP  Workstation: [id number]  Assessment and Plan:  Concern for acute appendicitis - CT noting enlarged appendix measuring up to 10 mm with associated pericolonic fat stranding.  Leukocytosis and abdominal right quadrant tenderness I will give concern for acute appendicitis.  Will place on empiric Zosyn , clear liquids, pain control as below.  General surgery consulted.  Holding off on appendectomy given possible need for colonoscopy.  Will monitor conservatively for now.  Will recheck CBC and monitor leukocytosis in AM.  Concern for GI malignancy - Noted incidentally on CT scan.  Unclear timing of last colonoscopy.  GI consulted.  Also holding off on urgent colonoscopy given acute appendicitis.  Will likely need to await resolution of appendicitis prior to malignancy workup.  Acute kidney injury on CKD 3A - Limited data for baseline creatinine, as low as 1.19.  Currently 1.60.  Ackley prerenal etiology.  Lactate 2.1 possibly from dehydration.  Holding home losartan .  IV fluids on board.  Will monitor urine output and recheck BMP in AM.  Acute microcytic anemia - Previous hemoglobin 15, now 8.9.  MCV low suggestive of possible iron deficiency likely from chronic GI bleed.  Likely secondary to above malignancy.  Will order iron studies to confirm.  Monitor hemoglobin, transfuse if less than 7.  Chronic pain disorder on chronic opioid therapy - Restarting patient's oxycodone  10 mg every 6 hours.  Also have IV opioid as needed.  Patient uses Movantik every other day for his bowels.  Hypertension - Antihypertensives for now.  Can reassess once volume resuscitated.  Multiple falls/wheelchair-bound/osteoporosis - Patient's baseline is wheelchair-bound but able to transfer to chair in bed.  Has history of multiple fractures including distal tibia, ankle, cervical with associated hardware.  May benefit from PT/OT consult after resolution of the above.  Direct of sleep apnea - Will order CPAP at  night.  Chronic neuropathy - Home Lyrica  added.  Advance Care Planning:   Code Status: Prior   Consults: Gastroenterology, general surgery  Family Communication: Wife at bedside  Severity of Illness: The appropriate patient status for this patient is INPATIENT. Inpatient status is judged to be reasonable and necessary in order to provide the required intensity of service to ensure the patient's safety. The patient's presenting symptoms, physical exam findings, and initial radiographic and laboratory data in the context of their chronic comorbidities is felt to place them at high risk for further clinical deterioration. Furthermore, it is not anticipated that the patient will be medically stable for discharge from the hospital within 2 midnights of admission.   * I certify that at the point of admission it is my clinical judgment that the patient will require inpatient hospital care spanning beyond 2 midnights from the point of admission due to high intensity of service, high risk for further deterioration and high frequency of surveillance required.*  Author: Carliss LELON Canales, DO 07/02/2024 3:56 PM  For on call review [email or web address].     [1]  Allergies Allergen Reactions   Codeine Nausea Only   "

## 2024-07-02 NOTE — ED Notes (Signed)
 Pt in radiology. Wife at bedside and updated.

## 2024-07-02 NOTE — Progress Notes (Signed)
 [facility]  Called by Dr. Melvenia regarding the patient.  He presented to the ED with a fall from his wheelchair yesterday.  While undergoing a full workup, he was noted to have a leukocytosis of 18.3 and a mild lactic acidosis of 2.1.  He underwent a CT of the chest, abdomen, and pelvis which demonstrated a long segment of wall thickening at the hepatic flexure with pericolonic fat stranding and prominent but not enlarged pericolonic lymph nodes, concerning for malignancy.  CT is also concerning for an enlarged appendiceal caliber at 10 mm with mild associated fat stranding, possibly representing acute appendicitis versus potentially malignancy.  Upon my evaluation of the CT scan, the appendix is mildly dilated with minimal fat stranding.  I do not appreciate an appendicolith on the imaging.  The fat stranding of his hepatic flexure is much more pronounced than the fat stranding near his appendix.    Given the concern for colonic malignancy and need for further workup of this, I would recommend nonoperative management of possible acute appendicitis.  Patient will also need IV antibiotics for hepatic flexure colitis, possibly superimposed on top of colonic malignancy.  If he were to undergo appendectomy, he would have to wait 4 weeks post surgery to undergo colonoscopy.  Recommend admission to hospitalist for IV antibiotics, pain control, and evaluation by GI for recommendations regarding inpatient versus outpatient colonoscopy.  Okay for clear liquids from a general surgery standpoint.  I will plan to see the patient tomorrow in consultation.  Please call with any questions.  Dorothyann Brittle, DO [facility] [address] [place], [place] [address] [phone] (office)

## 2024-07-02 NOTE — Hospital Course (Signed)
 78 y.o. male with history of HTN, OSA on CPAP, CKD 3A, osteoporosis with multiple fractures from falls, wheelchair-bound, chronic pain on chronic opioid therapy, presenting after being found down at home by his wife.   Assessment and Plan:   Concern for acute appendicitis - CT noting enlarged appendix measuring up to 10 mm with associated pericolonic fat stranding.  Leukocytosis and abdominal right quadrant tenderness I will give concern for acute appendicitis.  Will place on empiric Zosyn , clear liquids, pain control as below.  General surgery consulted.  Holding off on appendectomy given possible need for colonoscopy.  Will monitor conservatively for now.  Will recheck CBC and monitor leukocytosis in AM.   Concern for GI malignancy - Noted incidentally on CT scan.  Unclear timing of last colonoscopy.  GI consulted.  Also holding off on urgent colonoscopy given acute appendicitis.  Will likely need to await resolution of appendicitis prior to malignancy workup.   Acute kidney injury on CKD 3A - Limited data for baseline creatinine, as low as 1.19.  Currently 1.60.  Ackley prerenal etiology.  Lactate 2.1 possibly from dehydration.  Holding home losartan .  IV fluids on board.  Will monitor urine output and recheck BMP in AM.   Acute microcytic anemia - Previous hemoglobin 15, now 8.9.  MCV low suggestive of possible iron deficiency likely from chronic GI bleed.  Likely secondary to above malignancy.  Will order iron studies to confirm.  Monitor hemoglobin, transfuse if less than 7.   Chronic pain disorder on chronic opioid therapy - Restarting patient's oxycodone  10 mg every 6 hours.  Also have IV opioid as needed.  Patient uses Movantik every other day for his bowels.   Hypertension - Antihypertensives for now.  Can reassess once volume resuscitated.   Multiple falls/wheelchair-bound/osteoporosis - Patient's baseline is wheelchair-bound but able to transfer to chair in bed.  Has history of  multiple fractures including distal tibia, ankle, cervical with associated hardware.  May benefit from PT/OT consult after resolution of the above.   Direct of sleep apnea - Will order CPAP at night.   Chronic neuropathy - Home Lyrica  added.

## 2024-07-02 NOTE — ED Notes (Signed)
 Date and time results received: 07/02/2024 1224 (use smartphrase .now to insert current time)  Test: lactic acid  Critical Value: 2.1  Name of Provider Notified: Dr Melvenia  Orders Received? Or Actions Taken?: Orders Received - See Orders for details

## 2024-07-02 NOTE — ED Notes (Signed)
 ED Provider at bedside.

## 2024-07-02 NOTE — ED Triage Notes (Signed)
 Pt bib [facility] from home, wife called ems after finding patient on the floor of bathroom. Denies hitting his head and does not remember falling not one blood thinners. Not sure how long he was in the floor, no particular pain, just generalized body pain. Patient is wheelchair bound.

## 2024-07-02 NOTE — Progress Notes (Signed)
 [facility]  Based on GI recommendations, no plans for colonoscopy within next 4 weeks with concern for appendicitis.  Given no plans for scope for at least 4 weeks, will plan to proceed with robotic assisted laparoscopic appendectomy tomorrow.  I will come to discuss with the patient tomorrow morning.  NPO at midnight.  Continue antibiotics, even postoperatively to treat hepatic flexure inflammation.  Dorothyann Brittle, DO [facility] [address] [place], [place] [phone] (office)
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
## 2024-07-02 NOTE — Consult Note (Signed)
 "  Gastroenterology Consult   Referring Provider: No ref. provider found Primary Care Physician:  Volanda Saupe, MD Primary Gastroenterologist:  unassigned (last seen by [facility])  Patient ID: [name]; [id number]; Oct 01, 1945   Admit date: 07/02/2024  LOS: 0 days   Date of Consultation: 07/02/2024  Reason for Consultation:  concern for colitis, abnormal hepatic flexure, and appendicitis on CT with need to rule out malignancy  History of Present Illness   [name] is a 78 y.o. year old male with history of first-degree AV block, HTN, OSA, neuropathy, tremors, ankylosing spondylitis (wheelchair bound), previous heavy alcohol use who presented to the ED after having a fall overnight with likely + LOC which was suspected to be secondary to transfer from wheelchair to potty chair and was having generalized body soreness and pain in his right knee.  GI consulted given abnormal CT findings of the colon concerning for possible malignancy as well as concern for possible appendicitis versus malignancy.  ED course: Labs - leukocytosis with WBC 18.3, microcytic anemia with hemoglobin 8.9 ( 12.2 in sept 2024) and MCV 78.7, platelets elevated at 470. Lactate mildly elevated at 2.1.  Creatinine appears to be close to his baseline at 1.6.  Normal LFTs. Stool Hemoccult positive Given his fall and his leukocytosis, abdominal imaging was obtained and   Consult:  Patient reports he was possibly having some mild abdominal pain in the right lower quadrant prior to his fall but states a lot of times if he ignores pain.  He states that he powers through fevers if he has any and may take some Tylenol  if needed.  He believes he may have had some fevers recently.  He does state that he has baseline constipation for which he takes Movantik which causes the stools to be on the looser side but denies any overt diarrhea or change in bowel habits recently.  He denies any overt melena or BRBPR.   Denies any changes in appetite, states he has a decreased appetite at baseline which is been going on for a while and is not new for him.  He denies any significant weight loss or weight gain.  Does report some intermittent confusion and states he was confused this morning with his fall.  He believes his last bowel movement was yesterday.  He does state his pain currently is in the right lower quadrant.  He denied any overt NSAID use to me.  He does state he has had a colonoscopy before but could not remember the exact year but did state it was at least 8 years or more ago.  He denies remembering any concerns about anemia recently.  Per review of his labs in the [facility] he had a normal hemoglobin of 15 in March 2023 and in June 2025 he had a hemoglobin of 9.2.  Hemoglobin in September 2024 in [facility] revealed to be 12.2 with a WBC of 10.7.  In July 2025 he had an iron panel which revealed low iron of 23 and a low saturation of 7%.  Per review of care everywhere he had a colonoscopy in August 2011 where hemorrhoids were noted and it was otherwise normal and was recommended to repeat in 10 years.  EGD May 2020 performed by Dr. Harvey: - Moderate gastritis s/p biopsy - Few small sessile polyps in the stomach, removed with cold biopsy - Duodenitis in the first portion of duodenum - Gastritis and duodenitis suspected to be secondary to NSAIDs - Pathology  revealed to be polypoid reactive gastropathy with ulceration - Advised PPI twice daily - Advised colonoscopy in August 2021 and to avoid NSAIDs.  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 pt has note from [facility] indicating difficulty   First degree AV block    chronic   Gait instability    Hypertension    Multiple falls    OSA (obstructive sleep apnea)    Pain management    Peripheral axonal neuropathy    Peripheral neuropathy 06/06/2018   Tremor     Past Surgical History:  Procedure Laterality Date   ANKLE SURGERY Right    orif   BACK SURGERY     BIOPSY  11/05/2018   Procedure: BIOPSY;  Surgeon: Harvey Margo CROME, MD;  [facility]: [facility];  Service: Endoscopy;;   CHOLECYSTECTOMY     ESOPHAGOGASTRODUODENOSCOPY (EGD) WITH PROPOFOL  N/A 11/05/2018   Procedure: ESOPHAGOGASTRODUODENOSCOPY (EGD) WITH PROPOFOL ;  Surgeon: Harvey Margo CROME, MD;  Location: [facility];  Service: Endoscopy;  Laterality: N/A;  10:30am-rescheduled to 4/7 @ 7:30am per office   JOINT REPLACEMENT Bilateral    knees   NECK SURGERY     cervical fusion   SPINAL CORD STIMULATOR INSERTION  2017   TOTAL KNEE ARTHROPLASTY      Prior to Admission medications  Medication Sig Start Date End Date Taking? Authorizing Provider  acetaminophen  (TYLENOL ) 325 MG tablet Take 2 tablets (650 mg total) by mouth every 4 (four) hours as needed for mild pain (or temp > 37.5 C (99.5 F)). 09/29/19  Yes Emokpae, Courage, MD  amLODipine  (NORVASC ) 10 MG tablet Take 10 mg by mouth daily.   Yes [provider]  bacitracin ointment Apply 1 Application topically 2 (two) times daily as needed for wound care.   Yes [provider]  DULoxetine  (CYMBALTA ) 30 MG capsule Take 60 mg by mouth daily.   Yes [provider]  losartan  (COZAAR ) 25 MG tablet Take 1 tablet (25 mg total) by mouth daily. Patient taking differently: Take 12.5 mg by mouth daily. 09/29/19  Yes Emokpae, Courage, MD  naloxegol oxalate (MOVANTIK) 25 MG TABS tablet Take 12.5 mg by mouth daily.   Yes [provider]  naloxone  (NARCAN ) nasal spray 4 mg/0.1 mL Place 1 spray into the nose once.   Yes [provider]   oxyCODONE -acetaminophen  (PERCOCET) 10-325 MG tablet Take 1 tablet by mouth every 6 (six) hours as needed for pain. 09/09/19  Yes [provider]  patiromer (VELTASSA) 8.4 g packet Take 8.4 g by mouth daily. Take 3 hours before or after other medication   Yes [provider]  pregabalin  (LYRICA ) 150 MG capsule Take 200 mg by mouth 3 (three) times daily.   Yes [provider]  tamsulosin  (FLOMAX ) 0.4 MG CAPS capsule Take 1 capsule (0.4 mg total) by mouth at bedtime. Patient not taking: Reported on 07/02/2024 09/29/19   Pearlean Manus, MD    Current Facility-Administered Medications  Medication Dose Route Frequency Provider Last Rate Last Admin   fentaNYL  (SUBLIMAZE ) injection 50 mcg  50 mcg Intravenous Q1H PRN Melvenia Motto,  MD       lactated ringers  infusion   Intravenous Continuous Melvenia Motto, MD       piperacillin -tazobactam (ZOSYN ) IVPB 3.375 g  3.375 g Intravenous Once Melvenia Motto, MD 100 mL/hr at 07/02/24 1518 3.375 g at 07/02/24 1518   Current Outpatient Medications  Medication Sig Dispense Refill   acetaminophen  (TYLENOL ) 325 MG tablet Take 2 tablets (650 mg total) by mouth every 4 (four) hours as needed for mild pain (or temp > 37.5 C (99.5 F)). 12 tablet 0   amLODipine  (NORVASC ) 10 MG tablet Take 10 mg by mouth daily.     bacitracin ointment Apply 1 Application topically 2 (two) times daily as needed for wound care.     DULoxetine  (CYMBALTA ) 30 MG capsule Take 60 mg by mouth daily.     losartan  (COZAAR ) 25 MG tablet Take 1 tablet (25 mg total) by mouth daily. (Patient taking differently: Take 12.5 mg by mouth daily.) 30 tablet 2   naloxegol oxalate (MOVANTIK) 25 MG TABS tablet Take 12.5 mg by mouth daily.     naloxone  (NARCAN ) nasal spray 4 mg/0.1 mL Place 1 spray into the nose once.     oxyCODONE -acetaminophen  (PERCOCET) 10-325 MG tablet Take 1 tablet by mouth every 6 (six) hours as needed for pain.     patiromer (VELTASSA) 8.4 g packet Take 8.4 g by mouth  daily. Take 3 hours before or after other medication     pregabalin  (LYRICA ) 150 MG capsule Take 200 mg by mouth 3 (three) times daily.     tamsulosin  (FLOMAX ) 0.4 MG CAPS capsule Take 1 capsule (0.4 mg total) by mouth at bedtime. (Patient not taking: Reported on 07/02/2024) 30 capsule 2   Facility-Administered Medications Ordered in Other Encounters  Medication Dose Route Frequency Provider Last Rate Last Admin   ondansetron  (ZOFRAN ) 4 mg in sodium chloride  0.9 % 50 mL IVPB  4 mg Intravenous Q6H PRN Colon Shove, MD        Allergies as of 07/02/2024 - Review Complete 07/02/2024  Allergen Reaction Noted   Codeine Nausea Only     Family History  Problem Relation Age of Onset   Heart disease Mother     Social History   Socioeconomic History   Marital status: Married    Spouse name: Not on file   Number of children: Not on file   Years of education: Not on file   Highest education level: Not on file  Occupational History   Not on file  Tobacco Use   Smoking status: Never   Smokeless tobacco: Never  Vaping Use   Vaping status: Never Used  Substance and Sexual Activity   Alcohol use: Yes   Drug use: No   Sexual activity: Yes    Birth control/protection: None  Other Topics Concern   Not on file  Social History Narrative   Not on file   Social Drivers of Health   Tobacco Use: Low Risk (07/02/2024)   Patient History    Smoking Tobacco Use: Never    Smokeless Tobacco Use: Never    Passive Exposure: Not on file  Financial Resource Strain: Low Risk (12/24/2023)   Received from [facility]   Overall Financial Resource Strain (CARDIA)    Difficulty of Paying Living Expenses: Not hard at all  Food Insecurity: No Food Insecurity (12/24/2023)   Received from [facility]   Epic    Within the past 12 months, you worried that your food would run out before  you got the money to buy more.: Never true    Within the past 12 months, the food you bought just didn't last and you  didn't have money to get more.: Never true  Transportation Needs: No Transportation Needs (12/24/2023)   Received from [facility]   PRAPARE - Transportation    Lack of Transportation (Medical): No    Lack of Transportation (Non-Medical): No  Physical Activity: Unknown (12/24/2023)   Received from [facility]   Exercise Vital Sign    On average, how many days per week do you engage in moderate to strenuous exercise (like a brisk walk)?: 0 days    Minutes of Exercise per Session: Not on file  Stress: Stress Concern Present (12/24/2023)   Received from [facility] - Occupational Stress Questionnaire    Feeling of Stress : Rather much  Social Connections: Somewhat Isolated (12/24/2023)   Received from [facility]   Social Network    How would you rate your social network (family, work, friends)?: Restricted participation with some degree of social isolation  Intimate Partner Violence: Not At Risk (12/24/2023)   Received from [facility]   HITS    Over the last 12 months how often did your partner physically hurt you?: Never    Over the last 12 months how often did your partner insult you or talk down to you?: Never    Over the last 12 months how often did your partner threaten you with physical harm?: Never    Over the last 12 months how often did your partner scream or curse at you?: Never  Depression (PHQ2-9): High Risk (06/03/2021)   Depression (PHQ2-9)    PHQ-2 Score: 12  Alcohol Screen: Not on file  Housing: Low Risk (12/24/2023)   Received from [facility]    In the last 12 months, was there a time when you were not able to pay the mortgage or rent on time?: No    In the past 12 months, how many times have you moved where you were living?: 0    At any time in the past 12 months, were you homeless or living in a shelter (including now)?: No  Utilities: Not At Risk (12/24/2023)   Received from [facility] Utilities     Threatened with loss of utilities: No  Health Literacy: Not on file     Review of Systems   Gen: + Intermittent fever.  Denies any chills, loss of appetite, change in weight or weight loss CV: Denies chest pain, heart palpitations, syncope, edema  Resp: Denies shortness of breath with rest, cough, wheezing, coughing up blood, and pleurisy. GI: See HPI MS: + Wheelchair-bound, limitation of movement, neuropathy.  Denies joint pain, swelling, cramps, and atrophy.  Psych: + Intermittent confusion.  Denies depression, anxiety, memory loss, hallucinations, Heme: Denies bruising or bleeding Neuro:  Denies any headaches, dizziness, paresthesias, shaking  Physical Exam   Vital Signs in last 24 hours: Temp:  [98.5 F (36.9 C)-98.6 F (37 C)] 98.5 F (36.9 C) (12/30 1438) Pulse Rate:  [76-104] 76 (12/30 1430) Resp:  [13-17] 16 (12/30 1438) BP: (133-163)/(65-73) 136/65 (12/30 1430) SpO2:  [92 %-100 %] 95 % (12/30 1430)    General:   Alert,  pleasant and cooperative in NAD Head:  Normocephalic and atraumatic. Eyes:  Sclera clear, no icterus.   Conjunctiva pink. Ears:  Normal auditory acuity. deformity and mild erythema to  the right ear. Neck:  Supple; no masses Abdomen:  Soft, rounded, TTP to left upper quadrant and right lower quadrant.  Positive McBurney's point indicative of acute appendicitis. Normal bowel sounds. Neurologic:  Alert and  oriented x3. Psych:  Alert and cooperative. Normal mood and affect.  Intake/Output from previous day: No intake/output data recorded. Intake/Output this shift: No intake/output data recorded.  Labs/Studies   Recent Labs Recent Labs    07/02/24 1141  WBC 18.3*  HGB 8.9*  HCT 31.0*  PLT 470*   BMET Recent Labs    07/02/24 1141  NA 139  K 4.5  CL 103  CO2 18*  GLUCOSE 113*  BUN 27*  CREATININE 1.60*  CALCIUM  9.2   LFT Recent Labs    07/02/24 1141  PROT 7.8  ALBUMIN 3.9  AST 26  ALT 22  ALKPHOS 105  BILITOT 0.8    PT/INR Recent Labs    07/02/24 1141  LABPROT 15.1  INR 1.1   Hepatitis Panel No results for input(s): HEPBSAG, HCVAB, HEPAIGM, HEPBIGM in the last 72 hours. C-Diff No results for input(s): CDIFFTOX in the last 72 hours.  Radiology/Studies CT L-SPINE NO CHARGE Result Date: 07/02/2024 EXAM: CT OF THE LUMBAR SPINE WITHOUT CONTRAST 07/02/2024 01:48:17 PM TECHNIQUE: CT of the lumbar spine was performed without the administration of intravenous contrast. Multiplanar reformatted images are provided for review. Automated exposure control, iterative reconstruction, and/or weight based adjustment of the mA/kV was utilized to reduce the radiation dose to as low as reasonably achievable. COMPARISON: Of the lumbar spine 08/11/2020 CLINICAL HISTORY: FINDINGS: BONES AND ALIGNMENT: Normal vertebral body heights. No acute fracture or suspicious bone lesion. Normal alignment. Multilevel bulky anterior osteophyte formation. DEGENERATIVE CHANGES: L5-S1 intervertebral disc space narrowing. Posterior disc osteophyte complex at the L5-S1 level. At least moderate left L5-S1 osseous neural foraminal stenosis. SOFT TISSUES: No acute abnormality. IMPRESSION: 1. No acute fracture. Electronically signed by: Morgane Naveau MD 07/02/2024 02:36 PM EST RP Workstation: [id number]   CT T-SPINE NO CHARGE Result Date: 07/02/2024 EXAM: CT THORACIC SPINE WITHOUT CONTRAST 07/02/2024 01:48:17 PM TECHNIQUE: CT of the thoracic spine was performed without the administration of intravenous contrast. Multiplanar reformatted images are provided for review. Automated exposure control, iterative reconstruction, and/or weight based adjustment of the mA/kV was utilized to reduce the radiation dose to as low as reasonably achievable. COMPARISON: CT chest 10/07/2019. CLINICAL HISTORY: FINDINGS: BONES AND ALIGNMENT: Redemonstration of exaggerating kyphotic curvature centered at the thoracic spine T7 level due to chronic compression  fracture. T7 compression fracture is chronic, status post kyphoplasty. T6 and T10 chronic fracture with kyphoplasty. No acute fracture identified. No suspicious bone lesion. DEGENERATIVE CHANGES: Multilevel severe degenerative changes of the spine with bulky anterior contiguous osteophytes. Multilevel mild intervertebral disc space narrowing. Severe osseous neural foraminal stenosis at the T7-T8 levels. At least moderate osseous central canal stenosis at the T7 level. SOFT TISSUES: No acute abnormality. IMPRESSION: 1. No acute thoracic spine fracture. 2. Severe osseous neural foraminal stenosis at the T7-T8 levels. 3. At least moderate osseous central canal stenosis at the T7 level. Electronically signed by: Morgane Naveau MD 07/02/2024 02:19 PM EST RP Workstation: [id number]   CT CHEST ABDOMEN PELVIS WO CONTRAST Result Date: 07/02/2024 EXAM: CT CHEST, ABDOMEN AND PELVIS WITHOUT CONTRAST 07/02/2024 01:48:17 PM TECHNIQUE: CT of the chest, abdomen and pelvis was performed without the administration of intravenous contrast. Multiplanar reformatted images are provided for review. Automated exposure control, iterative reconstruction, and/or weight based adjustment of the mA/kV was utilized  to reduce the radiation dose to as low as reasonably achievable. COMPARISON: None available. CLINICAL HISTORY: Polytrauma, blunt. FINDINGS: CHEST: MEDIASTINUM AND LYMPH NODES: Heart and pericardium are unremarkable. The central airways are clear. No mediastinal, hilar or axillary lymphadenopathy. The main pulmonary artery is enlarged with a caliber measuring up to 3.4 cm. At least 3-vessel coronary artery calcifications. The esophagus is unremarkable. LUNGS AND PLEURA: No focal consolidation or pulmonary edema. No pleural effusion or pneumothorax. ABDOMEN AND PELVIS: LIVER: The liver is unremarkable. GALLBLADDER AND BILE DUCTS: Status post cholecystectomy. No biliary ductal dilatation. SPLEEN: No acute abnormality. PANCREAS: No  acute abnormality. ADRENAL GLANDS: No acute abnormality. KIDNEYS, URETERS AND BLADDER: Bilateral renal cortical scarring. No stones in the kidneys or ureters. No hydronephrosis. No perinephric or periureteral stranding. Urinary bladder is unremarkable. GI AND BOWEL: Stomach demonstrates no acute abnormality. No small bowel thickening or dilatation. The appendix is enlarged in caliber, measuring up to 1 cm, with associated appendiceal wall thickening. Trace periappendiceal fat stranding. Irregular approximately 15 cm in length bowel thickening of the hepatic flexure suggestive of malignancy. Associated pericolonic fat stranding and prominent benign enlarged pericolonic lymph nodes. There is no bowel obstruction. REPRODUCTIVE ORGANS: No acute abnormality. PERITONEUM AND RETROPERITONEUM: No ascites. No free air. VASCULATURE: Aorta is normal in caliber with mild to moderate atherosclerotic plaque. ABDOMINAL AND PELVIS LYMPH NODES: Prominent benign enlarged pericolonic lymph nodes associated with the hepatic flexure bowel thickening. REPRODUCTIVE ORGANS: No acute abnormality. BONES AND SOFT TISSUES: No acute displaced rib fracture. Old healed right posterior rib fractures. Old healed posterior left rib fractures. Old healed anterior left rib fractures. No acute displaced fracture or dislocation of the partially visualized bilateral shoulders. No acute displaced fracture of the partially visualized clavicles and scapulae. No acute fracture or dislocation of either hip. No acute displaced fracture or diastasis of the bones of the pelvis. No acute displaced fracture of the sacrum. Please see separately dictated CT thoracolumbar spine 07/02/2024. Degenerative changes of the bilateral hips. Neurostimulator back within the right flank soft tissues with fistulae coursing along the posterior canal and terminating at the T6 to T8 levels. No large soft tissue hematoma. IMPRESSION: 1. Long segment wall thickening of the hepatic  flexure with prominent but not enlarged pericolonic fat stranding suggestive of malignancy. Recommend GI consultation. 2. Enlarged appendiceal caliber measuring up to 10 mm with associated mild pericolonic fat stranding. Findings may represent acute appendicitis versus less likely malignancy involvement. 3. No intrathoracic, intra-abdominal, intrapelvic traumatic injury with limited evaluation of this noncontrast study. 4. No acute displaced fracture. Electronically signed by: Morgane Naveau MD 07/02/2024 02:12 PM EST RP Workstation: [id number]   CT CERVICAL SPINE WO CONTRAST Result Date: 07/02/2024 EXAM: CT CERVICAL SPINE WITHOUT CONTRAST 07/02/2024 01:48:17 PM TECHNIQUE: CT of the cervical spine was performed without the administration of intravenous contrast. Multiplanar reformatted images are provided for review. Automated exposure control, iterative reconstruction, and/or weight based adjustment of the mA/kV was utilized to reduce the radiation dose to as low as reasonably achievable. COMPARISON: None available. CLINICAL HISTORY: Polytrauma, blunt. FINDINGS: BONES AND ALIGNMENT: Occiput to the C4 level posterior surgical hardware. No CT findings to suggest surgical hardware complication. No acute fracture or traumatic malalignment. No severe osseous or foraminal or central canal stenosis. DEGENERATIVE CHANGES: Multilevel bulky anterior osteophyte formation that appears to be contiguous. Mild multilevel intervertebral disc space narrowing and calcification. SOFT TISSUES: No prevertebral soft tissue swelling. VASCULATURE: Atherosclerotic plaque of the aorta. IMPRESSION: 1. No evidence of acute traumatic injury. 2.  Occiput to the C4 level posterior surgical hardware without CT evidence of complication. 3. 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 Electronically signed by: Morgane Naveau MD 07/02/2024 01:56 PM EST RP Workstation: [id number]   CT HEAD WO CONTRAST Result Date:  07/02/2024 EXAM: CT HEAD WITHOUT CONTRAST 07/02/2024 01:48:17 PM TECHNIQUE: CT of the head was performed without the administration of intravenous contrast. Automated exposure control, iterative reconstruction, and/or weight based adjustment of the mA/kV was utilized to reduce the radiation dose to as low as reasonably achievable. COMPARISON: CT head 09/29/2019. CLINICAL HISTORY: Head trauma, moderate-severe. FINDINGS: BRAIN AND VENTRICLES: No acute hemorrhage. No evidence of acute infarct. Nonspecific periventricular and subcortical white matter hypoattenuation, favored to reflect chronic microvascular ischemic changes. Cerebral ventricle sizes are concordant with the degree of cerebral volume loss. No extra-axial collection. No mass effect or midline shift. Limited evaluation of the cerebellum. ORBITS: Bilateral lens replacement. SINUSES: Mucous retention cyst. SOFT TISSUES AND SKULL: Postoperative changes with fixation hardware along occiput. No CT findings to suggest surgical hardware complication. Fracture originating from the surgical hardware. No acute soft tissue abnormality. VASCULATURE: Atherosclerotic calcifications of carotid siphons and intracranial vertebral arteries. IMPRESSION: 1. No acute intracranial abnormality related to head trauma. 2. Postoperative changes with fixation hardware along the occiput without CT findings to suggest surgical hardware complication. Electronically signed by: Morgane Naveau MD 07/02/2024 01:54 PM EST RP Workstation: [id number]   DG Knee Right Port Result Date: 07/02/2024 EXAM: 1 OR 2 VIEW(S) XRAY OF THE RIGHT KNEE 07/02/2024 11:50:00 AM COMPARISON: None available. 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 No acute fracture. No malalignment. No significant joint effusion. SOFT TISSUES: Vascular calcifications are noted. IMPRESSION: 1. No evidence of acute traumatic injury. 2. 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  Electronically signed by: Morgane Naveau MD 07/02/2024 01:37 PM EST RP Workstation: [id number]   DG Hip Unilat W or Wo Pelvis 2-3 Views Right Result Date: 07/02/2024 EXAM: 2 or more VIEW(S) XRAY OF THE UNILATERAL HIP 07/02/2024 11:50:00 AM COMPARISON: CT abdomen and pelvis 07/02/2024. CLINICAL HISTORY: fall FINDINGS: BONES AND JOINTS: No acute displaced fracture or dislocation of either hip. No acute displaced fracture or dislocation of the pelvis. 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 LUMBAR SPINE: DISCS/DEGENERATIVE CHANGES: Degenerative changes in visualized lower lumbar spine. SOFT TISSUES: Transcutaneous neurostimulator device partially visualized in place. Bilateral pelvic phleboliths. IMPRESSION: 1. No acute findings. Electronically signed by: Morgane Naveau MD 07/02/2024 01:37 PM EST RP Workstation: [id number]     Assessment   [name] is a 78 y.o. year old male with history of first-degree AV block, HTN, OSA, neuropathy, tremors, ankylosing spondylitis (wheelchair bound), previous heavy alcohol use who presented to the ED after having a fall overnight with likely + LOC which was suspected to be secondary to transfer from wheelchair to potty chair and was having generalized body soreness and pain in his right knee.  Given leukocytosis, anemia and abnormal imaging findings of the colon, GI was consulted to assist in further evaluation along with general surgery.  Abnormal imaging of the colon and appendix (colitis? And appendicitis), anemia: Review of his labs he has had decline in his hemoglobin over the last year from 12 down to 8.9 today.  Labs in July of this year consistent with iron deficiency.  Stool heme positive in the ED.   CT of the chest abdomen pelvis this admission completed given leukocytosis and was found to have findings of long segment wall  thickening of the hepatic flexure with prominent pericolonic fat stranding and enlarged pericolonic lymph nodes as well as  enlarged appendiceal caliber measuring up to 10 mm with associated mild pericolonic fat stranding.  These findings have been concerning for malignancy as well as likely appendicitis and colitis.  On exam today he does have positive McBurney's point which is consistent with acute appendicitis.  In light of heme positive stool and anemia along with the imaging findings, this is concerning for malignancy and he is in need of further investigation with a colonoscopy however in the setting of leukocytosis along with possible component of colitis at the site of the hepatic flexure wall thickening and exam and imaging findings consistent with appendicitis this would make procedure much higher risk.  Case discussed with Dr. Cinderella who also feels as though given the likelihood of acute appendicitis at this time, colonoscopy should be deferred even if this requires waiting 4 weeks given the high risk nature of the procedure in causing perforation.  Will await full surgical consultation prior to definitive plan.  Plan / Recommendations   Trend H/H, transfuse for hemoglobin < 7 Add iron studies to labs Agree with antibiotics for treatment of appendicitis and possible superimposed colitis Appreciate general surgery recommendations Likely will await treatment of appendicitis and perform colonoscopy in about 4 weeks     07/02/2024, 3:28 PM  Charmaine Melia, MSN, FNP-BC, AGACNP-BC [facility]   "

## 2024-07-03 ENCOUNTER — Encounter (HOSPITAL_COMMUNITY)
Admission: EM | Disposition: A | Discharge status: Home / Self Care | Payer: Self-pay | Source: Home / Self Care | Attending: Internal Medicine

## 2024-07-03 ENCOUNTER — Inpatient Hospital Stay (HOSPITAL_COMMUNITY): Admitting: Certified Registered Nurse Anesthetist

## 2024-07-03 ENCOUNTER — Encounter (HOSPITAL_COMMUNITY): Payer: Self-pay | Admitting: Internal Medicine

## 2024-07-03 ENCOUNTER — Telehealth: Payer: Self-pay | Admitting: Gastroenterology

## 2024-07-03 DIAGNOSIS — K353 Acute appendicitis with localized peritonitis, without perforation or gangrene: Secondary | ICD-10-CM

## 2024-07-03 DIAGNOSIS — I10 Essential (primary) hypertension: Secondary | ICD-10-CM

## 2024-07-03 DIAGNOSIS — K3580 Unspecified acute appendicitis: Secondary | ICD-10-CM | POA: Diagnosis not present

## 2024-07-03 DIAGNOSIS — K660 Peritoneal adhesions (postprocedural) (postinfection): Secondary | ICD-10-CM | POA: Diagnosis not present

## 2024-07-03 HISTORY — PX: XI ROBOTIC LAPAROSCOPIC ASSISTED APPENDECTOMY: SHX6877

## 2024-07-03 LAB — URINALYSIS, ROUTINE W REFLEX MICROSCOPIC
Bilirubin Urine: NEGATIVE
Glucose, UA: NEGATIVE mg/dL
Ketones, ur: NEGATIVE mg/dL
Nitrite: POSITIVE — AB
Protein, ur: 30 mg/dL — AB
Specific Gravity, Urine: 1.012 (ref 1.005–1.030)
pH: 5 (ref 5.0–8.0)

## 2024-07-03 LAB — CBC
HCT: 28.2 % — ABNORMAL LOW (ref 39.0–52.0)
Hemoglobin: 8.2 g/dL — ABNORMAL LOW (ref 13.0–17.0)
MCH: 22.8 pg — ABNORMAL LOW (ref 26.0–34.0)
MCHC: 29.1 g/dL — ABNORMAL LOW (ref 30.0–36.0)
MCV: 78.3 fL — ABNORMAL LOW (ref 80.0–100.0)
Platelets: 355 K/uL (ref 150–400)
RBC: 3.6 MIL/uL — ABNORMAL LOW (ref 4.22–5.81)
RDW: 17.5 % — ABNORMAL HIGH (ref 11.5–15.5)
WBC: 12.3 K/uL — ABNORMAL HIGH (ref 4.0–10.5)
nRBC: 0 % (ref 0.0–0.2)

## 2024-07-03 LAB — PROTIME-INR
INR: 1.2 (ref 0.8–1.2)
Prothrombin Time: 16.2 s — ABNORMAL HIGH (ref 11.4–15.2)

## 2024-07-03 LAB — COMPREHENSIVE METABOLIC PANEL WITH GFR
ALT: 22 U/L (ref 0–44)
AST: 27 U/L (ref 15–41)
Albumin: 3.5 g/dL (ref 3.5–5.0)
Alkaline Phosphatase: 104 U/L (ref 38–126)
Anion gap: 15 (ref 5–15)
BUN: 29 mg/dL — ABNORMAL HIGH (ref 8–23)
CO2: 19 mmol/L — ABNORMAL LOW (ref 22–32)
Calcium: 8.9 mg/dL (ref 8.9–10.3)
Chloride: 104 mmol/L (ref 98–111)
Creatinine, Ser: 1.5 mg/dL — ABNORMAL HIGH (ref 0.61–1.24)
GFR, Estimated: 48 mL/min — ABNORMAL LOW
Glucose, Bld: 100 mg/dL — ABNORMAL HIGH (ref 70–99)
Potassium: 4.8 mmol/L (ref 3.5–5.1)
Sodium: 138 mmol/L (ref 135–145)
Total Bilirubin: 0.6 mg/dL (ref 0.0–1.2)
Total Protein: 7.1 g/dL (ref 6.5–8.1)

## 2024-07-03 LAB — APTT: aPTT: 45 s — ABNORMAL HIGH (ref 24–36)

## 2024-07-03 LAB — TYPE AND SCREEN
ABO/RH(D): A POS
Antibody Screen: NEGATIVE

## 2024-07-03 SURGERY — APPENDECTOMY, ROBOT-ASSISTED, LAPAROSCOPIC
Anesthesia: General

## 2024-07-03 MED ORDER — PROPOFOL 10 MG/ML IV BOLUS
INTRAVENOUS | Status: DC | PRN
  Administered 2024-07-03: 120 mg via INTRAVENOUS

## 2024-07-03 MED ORDER — LIDOCAINE 2% (20 MG/ML) 5 ML SYRINGE
INTRAMUSCULAR | Status: DC | PRN
  Administered 2024-07-03: 60 mg via INTRAVENOUS

## 2024-07-03 MED ORDER — HYDROMORPHONE HCL 1 MG/ML IJ SOLN: 1.0000 mg | INTRAMUSCULAR | Status: DC | PRN

## 2024-07-03 MED ORDER — LACTATED RINGERS IV SOLN: INTRAVENOUS | Status: DC

## 2024-07-03 MED ORDER — OXYCODONE HCL 5 MG PO TABS
10.0000 mg | ORAL_TABLET | Freq: Three times a day (TID) | ORAL | Status: DC
  Administered 2024-07-03 – 2024-07-04 (×3): 10 mg via ORAL
  Filled 2024-07-03 (×2): qty 2

## 2024-07-03 MED ORDER — BUPIVACAINE HCL (PF) 0.5 % IJ SOLN
INTRAMUSCULAR | Status: AC
  Filled 2024-07-03: qty 30

## 2024-07-03 MED ORDER — FENTANYL CITRATE (PF) 250 MCG/5ML IJ SOLN
INTRAMUSCULAR | Status: AC
  Filled 2024-07-03: qty 5

## 2024-07-03 MED ORDER — OXYCODONE HCL 5 MG PO TABS
10.0000 mg | ORAL_TABLET | Freq: Four times a day (QID) | ORAL | Status: DC | PRN
  Filled 2024-07-03: qty 2

## 2024-07-03 MED ORDER — CHLORHEXIDINE GLUCONATE 0.12 % MT SOLN
15.0000 mL | Freq: Once | OROMUCOSAL | Status: AC
  Administered 2024-07-03: 15 mL via OROMUCOSAL

## 2024-07-03 MED ORDER — ROCURONIUM BROMIDE 10 MG/ML (PF) SYRINGE
PREFILLED_SYRINGE | INTRAVENOUS | Status: AC
  Filled 2024-07-03: qty 10

## 2024-07-03 MED ORDER — STERILE WATER FOR IRRIGATION IR SOLN
Status: DC | PRN
  Administered 2024-07-03: 1000 mL

## 2024-07-03 MED ORDER — ACETAMINOPHEN 500 MG PO TABS
500.0000 mg | ORAL_TABLET | Freq: Four times a day (QID) | ORAL | Status: DC
  Administered 2024-07-03 – 2024-07-04 (×4): 500 mg via ORAL
  Filled 2024-07-03 (×4): qty 1

## 2024-07-03 MED ORDER — ROCURONIUM BROMIDE 10 MG/ML (PF) SYRINGE
PREFILLED_SYRINGE | INTRAVENOUS | Status: DC | PRN
  Administered 2024-07-03: 40 mg via INTRAVENOUS
  Administered 2024-07-03: 5 mg via INTRAVENOUS
  Administered 2024-07-03: 20 mg via INTRAVENOUS

## 2024-07-03 MED ORDER — CHLORHEXIDINE GLUCONATE CLOTH 2 % EX PADS: 6.0000 | MEDICATED_PAD | Freq: Once | CUTANEOUS | Status: DC

## 2024-07-03 MED ORDER — ONDANSETRON HCL 4 MG/2ML IJ SOLN
INTRAMUSCULAR | Status: DC | PRN
  Administered 2024-07-03: 4 mg via INTRAVENOUS

## 2024-07-03 MED ORDER — SUCCINYLCHOLINE CHLORIDE 200 MG/10ML IV SOSY
PREFILLED_SYRINGE | INTRAVENOUS | Status: AC
  Filled 2024-07-03: qty 10

## 2024-07-03 MED ORDER — ONDANSETRON HCL 4 MG/2ML IJ SOLN
INTRAMUSCULAR | Status: AC
  Filled 2024-07-03: qty 2

## 2024-07-03 MED ORDER — SUGAMMADEX SODIUM 200 MG/2ML IV SOLN
INTRAVENOUS | Status: DC | PRN
  Administered 2024-07-03: 300 mg via INTRAVENOUS

## 2024-07-03 MED ORDER — PROPOFOL 10 MG/ML IV BOLUS
INTRAVENOUS | Status: AC
  Filled 2024-07-03: qty 20

## 2024-07-03 MED ORDER — FENTANYL CITRATE (PF) 50 MCG/ML IJ SOSY: 25.0000 ug | PREFILLED_SYRINGE | INTRAMUSCULAR | Status: DC | PRN

## 2024-07-03 MED ORDER — DEXAMETHASONE SOD PHOSPHATE PF 10 MG/ML IJ SOLN
INTRAMUSCULAR | Status: DC | PRN
  Administered 2024-07-03: 5 mg via INTRAVENOUS

## 2024-07-03 MED ORDER — ORAL CARE MOUTH RINSE: 15.0000 mL | Freq: Once | OROMUCOSAL | Status: AC

## 2024-07-03 MED ORDER — SUCCINYLCHOLINE CHLORIDE 200 MG/10ML IV SOSY
PREFILLED_SYRINGE | INTRAVENOUS | Status: DC | PRN
  Administered 2024-07-03: 140 mg via INTRAVENOUS

## 2024-07-03 MED ORDER — SUGAMMADEX SODIUM 200 MG/2ML IV SOLN
INTRAVENOUS | Status: AC
  Filled 2024-07-03: qty 4

## 2024-07-03 MED ORDER — BUPIVACAINE HCL (PF) 0.5 % IJ SOLN
INTRAMUSCULAR | Status: DC | PRN
  Administered 2024-07-03: 30 mL

## 2024-07-03 MED ORDER — FENTANYL CITRATE (PF) 100 MCG/2ML IJ SOLN
INTRAMUSCULAR | Status: DC | PRN
  Administered 2024-07-03 (×4): 50 ug via INTRAVENOUS

## 2024-07-03 MED ORDER — LIDOCAINE 2% (20 MG/ML) 5 ML SYRINGE
INTRAMUSCULAR | Status: AC
  Filled 2024-07-03: qty 5

## 2024-07-03 SURGICAL SUPPLY — 37 items
CHLORAPREP W/TINT 26 (MISCELLANEOUS) ×1 IMPLANT
COVER LIGHT HANDLE STERIS (MISCELLANEOUS) ×2 IMPLANT
COVER MAYO STAND XLG (MISCELLANEOUS) ×1 IMPLANT
DEFOGGER SCOPE WARM SEASHARP (MISCELLANEOUS) ×1 IMPLANT
DERMABOND ADVANCED .7 DNX12 (GAUZE/BANDAGES/DRESSINGS) ×1 IMPLANT
DERMABOND ADVANCED .7 DNX6 (GAUZE/BANDAGES/DRESSINGS) IMPLANT
DRAPE ARM DVNC X/XI (DISPOSABLE) ×3 IMPLANT
DRAPE COLUMN DVNC XI (DISPOSABLE) ×1 IMPLANT
FORCEPS BPLR R/ABLATION 8 DVNC (INSTRUMENTS) ×1 IMPLANT
GLOVE BIOGEL PI IND STRL 6.5 (GLOVE) ×2 IMPLANT
GLOVE BIOGEL PI IND STRL 7.0 (GLOVE) ×3 IMPLANT
GLOVE SURG SS PI 6.5 STRL IVOR (GLOVE) ×2 IMPLANT
GOWN STRL REUS W/TWL LRG LVL3 (GOWN DISPOSABLE) ×3 IMPLANT
GRASPER SUT TROCAR 14GX15 (MISCELLANEOUS) ×1 IMPLANT
HEMOSTAT SNOW SURGICEL 2X4 (HEMOSTASIS) IMPLANT
IRRIGATOR SUCT 8 DISP DVNC XI (IRRIGATION / IRRIGATOR) IMPLANT
KIT PINK PAD W/HEAD ARM REST (MISCELLANEOUS) ×1 IMPLANT
KIT TURNOVER KIT A (KITS) ×1 IMPLANT
MANIFOLD NEPTUNE II (INSTRUMENTS) ×1 IMPLANT
NEEDLE HYPO 21X1.5 SAFETY (NEEDLE) ×1 IMPLANT
NEEDLE INSUFFLATION 14GA 120MM (NEEDLE) ×1 IMPLANT
OBTURATOR OPTICALSTD 8 DVNC (TROCAR) ×1 IMPLANT
PACK LAP CHOLE LZT030E (CUSTOM PROCEDURE TRAY) ×1 IMPLANT
PENCIL HANDSWITCHING (ELECTRODE) ×1 IMPLANT
RELOAD STAPLE 30 2.5 WHT DVNC (STAPLE) ×1 IMPLANT
SEAL UNIV 5-12 XI (MISCELLANEOUS) ×4 IMPLANT
SEALER VESSEL EXT DVNC XI (MISCELLANEOUS) ×1 IMPLANT
SET BASIN LINEN APH (SET/KITS/TRAYS/PACK) ×1 IMPLANT
SET TUBE SMOKE EVAC HIGH FLOW (TUBING) ×1 IMPLANT
SOLN STERILE WATER 500 ML (IV SOLUTION) ×1 IMPLANT
STAPLER 30 CRVD 8 SUREFORM (STAPLE) ×1 IMPLANT
SUT MNCRL AB 4-0 PS2 18 (SUTURE) ×1 IMPLANT
SUT VICRYL 0 UR6 27IN ABS (SUTURE) ×1 IMPLANT
SYR 30ML LL (SYRINGE) ×1 IMPLANT
SYSTEM RETRIEVL 5MM INZII UNIV (BASKET) ×1 IMPLANT
TAPE TRANSPORE STRL 2 31045 (GAUZE/BANDAGES/DRESSINGS) ×1 IMPLANT
TRAY FOLEY W/BAG SLVR 16FR ST (SET/KITS/TRAYS/PACK) ×1 IMPLANT

## 2024-07-03 NOTE — Anesthesia Procedure Notes (Signed)
 Procedure Name: Intubation Date/Time: 07/03/2024 9:28 AM  Performed by: Elaine Delon CROME, CRNAPre-anesthesia Checklist: Patient identified, Emergency Drugs available, Suction available and Patient being monitored Patient Re-evaluated:Patient Re-evaluated prior to induction Oxygen  Delivery Method: Circle system utilized Preoxygenation: Pre-oxygenation with 100% oxygen  Induction Type: IV induction and Rapid sequence Laryngoscope Size: Glidescope and 3 Grade View: Grade II Tube type: Oral Tube size: 7.5 mm Number of attempts: 1 Airway Equipment and Method: Rigid stylet and Video-laryngoscopy Placement Confirmation: ETT inserted through vocal cords under direct vision, positive ETCO2 and breath sounds checked- equal and bilateral Secured at: 22 cm Tube secured with: Tape Dental Injury: Teeth and Oropharynx as per pre-operative assessment  Difficulty Due To: Difficulty was anticipated and Difficult Airway- due to reduced neck mobility Future Recommendations: Recommend- induction with short-acting agent, and alternative techniques readily available Comments: 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 Recommend having alternative techniques readily available.

## 2024-07-03 NOTE — Telephone Encounter (Signed)
 Per Dr. Shaaron. Patient needs an ov in 4 weeks to consider ileocolonoscopy. 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 Surgery at [facility] today for appendectomy and will need to ask anesthesiology if ok with doing ileocolonoscopy here at [facility].  Never been seen in our office, seen inpatient setting only. Last seen by GI at [facility] in 2022 but patient agreeable to appt here locally.  Dr. Cinderella seen in consultation yesterday with Charmaine. Please arrange for follow up with either Dr.Ahmed or Courtney.

## 2024-07-03 NOTE — Op Note (Signed)
 [facility] Operative Note  Preoperative diagnosis: Acute appendicitis  Postoperative diagnosis: acute appendicitis, intra-abdominal adhesions  Procedure: Robotic assisted laparoscopic appendectomy, lysis of adhesions  Anesthesia: General   Surgeon: Dorothyann Brittle, DO  Wound Classification: Contaminated  Specimen: Appendix  Complications: None  Estimated Blood Loss: 10 cc  Indications: Patient is a 78 y.o. male  presented after a fall at home.  He also complained of abdominal pain.  He underwent a CT pelvis which demonstrated an colon with thickening and fat stranding at the hepatic flexure concerning for malignancy and a dilated and mildly inflamed appendix.  GI stated that they did not plan to perform colonoscopy for 4 weeks, so decision was made to take the patient to the operating room for appendectomy.  The risk of surgery were explained to the patient including but not limited to bleeding, infection, finding a rupture, injury to other organs, needing to do an open procedure, and development of intra-abdominal abscess.  Patient is agreeable to surgery and both written and verbal consent were obtained.  FIndings: Mildly inflamed and dilated appendix Upon entering the abdomen (organ space), I encountered a phlegmon involving the appendix. Staple line along the base of the appendix was noted to be intact Hemostasis was noted at the completion of the case  Description of procedure: The patient was placed on the operating table in the supine position, left arm tucked. General anesthesia was induced. A time-out was completed verifying correct patient, procedure, site, positioning, and implant(s) and/or special equipment prior to beginning this procedure. The abdomen was prepped and draped in the usual sterile fashion.   At Palmer's point, an incision was made and Veress needle was inserted.  After confirming intraabdominal location with positive saline drop test  and low insufflation pressures, gas insufflation was initiated until the abdominal pressure was measured at 15 mmHg.  Afterwards, the Veress needle was removed and a 8 mm port was placed through the Palmer's point site using Optiview technique. No injuries were noted.  Two additional incisions were made 8 cm apart along the left side of the abdominal wall from the initial incision.  8 mm ports were then placed under direct visualization.  No injuries from trocar placements were noted. The table flexed and was placed in the Trendelenburg position with the right side elevated.  Xi robotic platform was then brought to the operative field and docked. A vessel sealer was placed through the Palmer's point port and a forced bipolar through the lower 8 mm port.  The patient had omentum adhesed to the anterior abdominal wall.  These adhesions were taken down with vessel sealer.  Upon entering the abdomen (organ space), I encountered a phlegmon involving the appendix. An appendix, that appeared inflamed, was identified and elevated.  Infection was present within the abdominal cavity due to appendicitis.  Mesoappendix was taken down with vessel sealer.  A white load linear cutting stapler was placed through the Palmer's point port, and then used to divide and staple the base of the appendix. No bleeding from the staple line was noted.  The appendix was placed in an endoscopic retrieval bag and removed through the Palmer's point port.   The appendiceal stump staple line was examined again and hemostasis noted.  Surgical snow was placed over top of the staple line and mesoappendix.  No other pathology/implants/metastatic disease was identified within the abdomen or pelvis. The Palmer's point trocar removed and port site closed with PMI using 0 vicryl under direct vision. Remaining trocars  were removed. No bleeding was noted.  The abdomen was allowed to collapse.  Marcaine was instilled at the incision sites.  All skin  incisions then closed with subcuticular sutures Monocryl 4-0.  Wounds then dressed with dermabond.  The patient tolerated the procedure well, awakened from anesthesia and was taken to the postanesthesia care unit in satisfactory condition.  Sponge count and instrument count correct at the end of the procedure.  Dorothyann Brittle, DO [facility] [address] [place], [place] [address] [phone] (office)

## 2024-07-03 NOTE — Progress Notes (Signed)
 [facility]  Spoke with the patient's family in the consultation room.  I explained that he tolerated the procedure without difficulty.  He has dissolvable stitches under the skin with overlying skin glue.  This will flake off in 10 to 14 days.  He will return to his room on the floor and we will give him a diet.  We will see how he progresses over the next 24 hours, and hopefully he will be stable for discharge home tomorrow.  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  He will then subsequently need colonoscopy at 4 weeks postop to evaluate hepatic flexure.  All questions were answered to their expressed satisfaction.  Plan: - Return to room on the floor - Regular diet ordered - IV fluids per primary team - Continue IV Zosyn  for colitis - PRN pain control and antiemetics - AM labs tomorrow - PT ordered - Appreciate GI recommendations regarding outpatient colonoscopy.  Will need to wait 4 weeks from surgery - Appreciate hospitalist recommendations  Dorothyann Brittle, DO [facility] [address] [place], [place] [address] [phone] (office)

## 2024-07-03 NOTE — Progress Notes (Signed)
 Received phone call from pre-op about patient having surgery. Patient unaware and was not reported during shift change. Went in to assess the patient and was told that he had been transported for surgical procedure. Assessment deferred as patient is off the floor.

## 2024-07-03 NOTE — Progress Notes (Signed)
 Seen post appendectomy, courtesy visit. Updated patient on plan.  [name] is a 78 y.o. year old male with history of first-degree AV block, HTN, OSA, neuropathy, tremors, ankylosing spondylitis (wheelchair bound), previous heavy alcohol use who presented to the ED after having a fall overnight with likely + LOC which was suspected to be secondary to transfer from wheelchair to potty chair and was having generalized body soreness and pain in his right knee.  Given leukocytosis, anemia and abnormal imaging findings of the colon, GI was consulted to assist in further evaluation along with general surgery.   Abnormal imaging of the colon and appendix (colitis? And appendicitis), anemia: Review of his labs he has had decline in his hemoglobin over the last year from 12 down to 8.9 today.  Labs in July of this year consistent with iron deficiency.  Stool heme positive in the ED.    CT of the chest abdomen pelvis this admission completed given leukocytosis and was found to have findings of long segment wall thickening of the hepatic flexure with prominent pericolonic fat stranding and enlarged pericolonic lymph nodes as well as enlarged appendiceal caliber measuring up to 10 mm with associated mild pericolonic fat stranding.  These findings have been concerning for malignancy as well as likely appendicitis and colitis.  On exam today he does have positive McBurney's point which is consistent with acute appendicitis.    Patient underwent appendectomy today. Per Dr. Evonnie, no obvious evidence of metastatic disease intra-abdominally, she did not significantly evaluate the hepatic flexure to prevent injury or other complication. He will need to be on antibiotics to treat the colitis for probably 7 to 14 days.   He will need follow-up with GI for colonoscopy for evaluation of the hepatic flexure. He was noted to have difficult airway given his inability to extend his neck. There will need to be a  discussion between GI and anesthesia if he is an ideal candidate for colonoscopy at [facility] versus needing to go somewhere else. Plans to see him in office for follow up.  Patient aware of plan.       Sonny RAMAN. Ezzard RIGGERS [facility] [phone] 12/31/20256:52 AM

## 2024-07-03 NOTE — Consult Note (Signed)
 [facility] Consult  Reason for Consult: Concern for acute appendicitis Referring Physician: Dr. Melvenia  Chief Complaint   Fall     HPI: [name] is a 78 y.o. male who presented to the hospital with a fall from his wheelchair.  He states that yesterday he had a fall in which his wheelchair was stuck on top of him.  He called the EMS for assistance and they brought him to the emergency department.  While in the emergency department, he complained of right-sided abdominal pain.  He is unsure when the pain started.  He denies any associated nausea or vomiting.  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  He states that he still has some right sided abdominal pain.  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  His surgical history is significant for a laparoscopic cholecystectomy.  In the emergency department, he was noted to have a leukocytosis of 18.3 with a mild lactic acidosis of 2.1.  He also had an AKI with a creatinine of 1.6.  He underwent numerous CT scans.  CT of the abdomen and pelvis demonstrated a long segment of wall thickening at the hepatic flexure with associated fat stranding and some prominent but not enlarged pericolonic lymph nodes suggestive of malignancy.  The scan also noted an enlarged appendiceal caliber measuring up to 10 mm with associated mild pericolonic fat stranding, possibly representing appendicitis versus less likely malignancy.  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  pt has note from [facility] indicating difficulty   First degree AV block    chronic   Gait instability    Hypertension    Multiple falls    OSA (obstructive sleep apnea)    Pain management    Peripheral axonal neuropathy    Peripheral neuropathy 06/06/2018   Tremor     Past Surgical History:  Procedure Laterality Date   ANKLE SURGERY Right    orif   BACK SURGERY     BIOPSY  11/05/2018   Procedure: BIOPSY;  Surgeon: Harvey Margo CROME, MD;  Location: [facility];  Service: Endoscopy;;   CHOLECYSTECTOMY     ESOPHAGOGASTRODUODENOSCOPY (EGD) WITH PROPOFOL  N/A 11/05/2018   Procedure: ESOPHAGOGASTRODUODENOSCOPY (EGD) WITH PROPOFOL ;  Surgeon: Harvey Margo CROME, MD;  Location: [facility];  Service: Endoscopy;  Laterality: N/A;  10:30am-rescheduled to 4/7 @ 7:30am per office   JOINT REPLACEMENT Bilateral    knees   NECK SURGERY     cervical fusion   SPINAL CORD STIMULATOR INSERTION  2017   TOTAL KNEE ARTHROPLASTY      Family History  Problem Relation Age of Onset   Heart disease Mother     Social History[1]  Medications: I have reviewed the patient's current medications.  Allergies[2]   ROS:  Pertinent items are noted in HPI.  Blood pressure (!) 129/59, pulse 66, temperature 98.8 F (37.1 C), temperature source Oral, resp. rate 16, height 6' (1.829 m), weight 113.4 kg, SpO2 96%. Physical Exam Vitals reviewed.  Constitutional:  Appearance: Normal appearance.  HENT:     Head: Normocephalic and atraumatic.  Eyes:     Pupils: Pupils are equal, round, and reactive to light.  Cardiovascular:     Rate and Rhythm: Normal rate.  Pulmonary:     Effort: Pulmonary effort is normal.  Abdominal:     Comments: Abdomen soft, nondistended, no percussion tenderness, tenderness to palpation in the right side of the abdomen, both upper and lower quadrants; no rigidity, guarding, or rebound tenderness  Musculoskeletal:        General: Normal range of motion.     Cervical back: Normal  range of motion.  Skin:    General: Skin is warm and dry.  Neurological:     General: No focal deficit present.     Mental Status: He is alert and oriented to person, place, and time.  Psychiatric:        Mood and Affect: Mood normal.        Behavior: Behavior normal.     Results: Results for orders placed or performed during the hospital encounter of 07/02/24 (from the past 48 hours)  CBG monitoring, ED     Status: Abnormal   Collection Time: 07/02/24 11:12 AM  Result Value Ref Range   Glucose-Capillary 113 (H) 70 - 99 mg/dL    Comment: Glucose reference range applies only to samples taken after fasting for at least 8 hours.  Comprehensive metabolic panel     Status: Abnormal   Collection Time: 07/02/24 11:41 AM  Result Value Ref Range   Sodium 139 135 - 145 mmol/L   Potassium 4.5 3.5 - 5.1 mmol/L   Chloride 103 98 - 111 mmol/L   CO2 18 (L) 22 - 32 mmol/L   Glucose, Bld 113 (H) 70 - 99 mg/dL    Comment: Glucose reference range applies only to samples taken after fasting for at least 8 hours.   BUN 27 (H) 8 - 23 mg/dL   Creatinine, Ser 8.39 (H) 0.61 - 1.24 mg/dL   Calcium  9.2 8.9 - 10.3 mg/dL   Total Protein 7.8 6.5 - 8.1 g/dL   Albumin 3.9 3.5 - 5.0 g/dL   AST 26 15 - 41 U/L   ALT 22 0 - 44 U/L   Alkaline Phosphatase 105 38 - 126 U/L   Total Bilirubin 0.8 0.0 - 1.2 mg/dL   GFR, Estimated 44 (L) >60 mL/min    Comment: (NOTE) Calculated using the CKD-EPI Creatinine Equation (2021)    Anion gap 18 (H) 5 - 15    Comment: Performed at [facility], [address] [place], [place] [address]  CBC     Status: Abnormal   Collection Time: 07/02/24 11:41 AM  Result Value Ref Range   WBC 18.3 (H) 4.0 - 10.5 K/uL   RBC 3.94 (L) 4.22 - 5.81 MIL/uL   Hemoglobin 8.9 (L) 13.0 - 17.0 g/dL   HCT 68.9 (L) 60.9 - 47.9 %   MCV 78.7 (L) 80.0 - 100.0 fL   MCH 22.6 (L) 26.0 - 34.0 pg   MCHC 28.7 (L) 30.0 - 36.0 g/dL   RDW 82.4 (H) 88.4 - 84.4 %   Platelets 470 (H) 150 - 400 K/uL   nRBC  0.0 0.0 - 0.2 %    Comment: Performed at [facility], [address], [place], [place] [address]  Lactic acid, plasma     Status: Abnormal   Collection Time: 07/02/24 11:41 AM  Result Value Ref Range   Lactic  Acid, Venous 2.1 (HH) 0.5 - 1.9 mmol/L    Comment: Critical Value, Read Back and verified with  [facility]  AT 12:24PM  ON 07/02/24 BY [facility] Performed at [facility], [address], [place], [place] [address]   Protime-INR     Status: None   Collection Time: 07/02/24 11:41 AM  Result Value Ref Range   Prothrombin Time 15.1 11.4 - 15.2 seconds   INR 1.1 0.8 - 1.2    Comment: (NOTE) INR goal varies based on device and disease states. Performed at [facility], [address] [place], [place] [address]   Magnesium      Status: None   Collection Time: 07/02/24 11:41 AM  Result Value Ref Range   Magnesium  2.0 1.7 - 2.4 mg/dL    Comment: Performed at [facility], [address] [place], [place] [address]  CK     Status: None   Collection Time: 07/02/24 11:41 AM  Result Value Ref Range   Total CK 176 49 - 397 U/L    Comment: Performed at [facility], [address], [place], [place] [address]  Troponin T, High Sensitivity     Status: Abnormal   Collection Time: 07/02/24 11:41 AM  Result Value Ref Range   Troponin T High Sensitivity 33 (H) 0 - 19 ng/L    Comment: (NOTE) Biotin concentrations > 1000 ng/mL falsely decrease TnT results.  Serial cardiac troponin measurements are suggested.  Refer to the Links section for chest pain algorithms and additional  guidance. Performed at [facility], [address], [place], [place] [address]   Iron and TIBC     Status: Abnormal   Collection Time: 07/02/24 11:41 AM  Result Value Ref Range   Iron 12 (L) 45 - 182 ug/dL   TIBC 622 749 - 549 ug/dL   Saturation Ratios 3 (L) 17.9 - 39.5 %   UIBC 364 ug/dL    Comment: Performed at [facility], [address], [place], [place] [address]  Ferritin     Status: None   Collection Time:  07/02/24 11:41 AM  Result Value Ref Range   Ferritin 33 24 - 336 ng/mL    Comment: Performed at [facility], [address] [place], [place] [address]  Reticulocytes     Status: Abnormal   Collection Time: 07/02/24 11:41 AM  Result Value Ref Range   Retic Ct Pct 2.2 0.4 - 3.1 %   RBC. 3.88 (L) 4.22 - 5.81 MIL/uL   Retic Count, Absolute 83.8 19.0 - 186.0 K/uL   Immature Retic Fract 29.3 (H) 2.3 - 15.9 %    Comment: Performed at [facility], [address], [place], [place] [address]  Folate     Status: None   Collection Time: 07/02/24 11:41 AM  Result Value Ref Range   Folate >20.0 >5.9 ng/mL    Comment: Performed at [facility], [address] [place], [place] [address]  Vitamin B12     Status: None   Collection Time: 07/02/24 11:41 AM  Result Value Ref Range   Vitamin B-12 674 180 - 914 pg/mL    Comment: Performed at [facility], [address], [place], [place] [address]  POC occult blood, ED     Status: None   Collection Time: 07/02/24  2:38 PM  Result Value Ref Range   Positive positive   Comprehensive metabolic panel     Status: Abnormal   Collection Time: 07/03/24  4:33 AM  Result Value Ref Range  Sodium 138 135 - 145 mmol/L   Potassium 4.8 3.5 - 5.1 mmol/L   Chloride 104 98 - 111 mmol/L   CO2 19 (L) 22 - 32 mmol/L   Glucose, Bld 100 (H) 70 - 99 mg/dL    Comment: Glucose reference range applies only to samples taken after fasting for at least 8 hours.   BUN 29 (H) 8 - 23 mg/dL   Creatinine, Ser 8.49 (H) 0.61 - 1.24 mg/dL   Calcium  8.9 8.9 - 10.3 mg/dL   Total Protein 7.1 6.5 - 8.1 g/dL   Albumin 3.5 3.5 - 5.0 g/dL   AST 27 15 - 41 U/L   ALT 22 0 - 44 U/L   Alkaline Phosphatase 104 38 - 126 U/L   Total Bilirubin 0.6 0.0 - 1.2 mg/dL   GFR, Estimated 48 (L) >60 mL/min    Comment: (NOTE) Calculated using the CKD-EPI Creatinine Equation (2021)    Anion gap 15 5 - 15    Comment: Performed at [facility], [address], [place], [place] [address]  CBC     Status:  Abnormal   Collection Time: 07/03/24  4:33 AM  Result Value Ref Range   WBC 12.3 (H) 4.0 - 10.5 K/uL   RBC 3.60 (L) 4.22 - 5.81 MIL/uL   Hemoglobin 8.2 (L) 13.0 - 17.0 g/dL   HCT 71.7 (L) 60.9 - 47.9 %   MCV 78.3 (L) 80.0 - 100.0 fL   MCH 22.8 (L) 26.0 - 34.0 pg   MCHC 29.1 (L) 30.0 - 36.0 g/dL   RDW 82.4 (H) 88.4 - 84.4 %   Platelets 355 150 - 400 K/uL   nRBC 0.0 0.0 - 0.2 %    Comment: Performed at [facility], [address], [place], [place] [address]  Protime-INR     Status: Abnormal   Collection Time: 07/03/24  4:33 AM  Result Value Ref Range   Prothrombin Time 16.2 (H) 11.4 - 15.2 seconds   INR 1.2 0.8 - 1.2    Comment: (NOTE) INR goal varies based on device and disease states. Performed at [facility], [address] [place], [place] [address]   APTT     Status: Abnormal   Collection Time: 07/03/24  4:33 AM  Result Value Ref Range   aPTT 45 (H) 24 - 36 seconds    Comment:        IF BASELINE aPTT IS ELEVATED, SUGGEST PATIENT RISK ASSESSMENT BE USED TO DETERMINE APPROPRIATE ANTICOAGULANT THERAPY. Performed at [facility], [address], [place], [place] [address]     CT L-SPINE NO CHARGE Result Date: 07/02/2024 EXAM: CT OF THE LUMBAR SPINE WITHOUT CONTRAST 07/02/2024 01:48:17 PM TECHNIQUE: CT of the lumbar spine was performed without the administration of intravenous contrast. Multiplanar reformatted images are provided for review. Automated exposure control, iterative reconstruction, and/or weight based adjustment of the mA/kV was utilized to reduce the radiation dose to as low as reasonably achievable. COMPARISON: Of the lumbar spine 08/11/2020 CLINICAL HISTORY: FINDINGS: BONES AND ALIGNMENT: Normal vertebral body heights. No acute fracture or suspicious bone lesion. Normal alignment. Multilevel bulky anterior osteophyte formation. DEGENERATIVE CHANGES: L5-S1 intervertebral disc space narrowing. Posterior disc osteophyte complex at the L5-S1 level. At least moderate  left L5-S1 osseous neural foraminal stenosis. SOFT TISSUES: No acute abnormality. IMPRESSION: 1. No acute fracture. Electronically signed by: Morgane Naveau MD 07/02/2024 02:36 PM EST RP Workstation: [id number]   CT T-SPINE NO CHARGE Result Date: 07/02/2024 EXAM: CT THORACIC SPINE WITHOUT CONTRAST  07/02/2024 01:48:17 PM TECHNIQUE: CT of the thoracic spine was performed without the administration of intravenous contrast. Multiplanar reformatted images are provided for review. Automated exposure control, iterative reconstruction, and/or weight based adjustment of the mA/kV was utilized to reduce the radiation dose to as low as reasonably achievable. COMPARISON: CT chest 10/07/2019. CLINICAL HISTORY: FINDINGS: BONES AND ALIGNMENT: Redemonstration of exaggerating kyphotic curvature centered at the thoracic spine T7 level due to chronic compression fracture. T7 compression fracture is chronic, status post kyphoplasty. T6 and T10 chronic fracture with kyphoplasty. No acute fracture identified. No suspicious bone lesion. DEGENERATIVE CHANGES: Multilevel severe degenerative changes of the spine with bulky anterior contiguous osteophytes. Multilevel mild intervertebral disc space narrowing. Severe osseous neural foraminal stenosis at the T7-T8 levels. At least moderate osseous central canal stenosis at the T7 level. SOFT TISSUES: No acute abnormality. IMPRESSION: 1. No acute thoracic spine fracture. 2. Severe osseous neural foraminal stenosis at the T7-T8 levels. 3. At least moderate osseous central canal stenosis at the T7 level. Electronically signed by: Morgane Naveau MD 07/02/2024 02:19 PM EST RP Workstation: [id number]   CT CHEST ABDOMEN PELVIS WO CONTRAST Result Date: 07/02/2024 EXAM: CT CHEST, ABDOMEN AND PELVIS WITHOUT CONTRAST 07/02/2024 01:48:17 PM TECHNIQUE: CT of the chest, abdomen and pelvis was performed without the administration of intravenous contrast. Multiplanar reformatted images are provided for  review. Automated exposure control, iterative reconstruction, and/or weight based adjustment of the mA/kV was utilized to reduce the radiation dose to as low as reasonably achievable. COMPARISON: None available. CLINICAL HISTORY: Polytrauma, blunt. FINDINGS: CHEST: MEDIASTINUM AND LYMPH NODES: Heart and pericardium are unremarkable. The central airways are clear. No mediastinal, hilar or axillary lymphadenopathy. The main pulmonary artery is enlarged with a caliber measuring up to 3.4 cm. At least 3-vessel coronary artery calcifications. The esophagus is unremarkable. LUNGS AND PLEURA: No focal consolidation or pulmonary edema. No pleural effusion or pneumothorax. ABDOMEN AND PELVIS: LIVER: The liver is unremarkable. GALLBLADDER AND BILE DUCTS: Status post cholecystectomy. No biliary ductal dilatation. SPLEEN: No acute abnormality. PANCREAS: No acute abnormality. ADRENAL GLANDS: No acute abnormality. KIDNEYS, URETERS AND BLADDER: Bilateral renal cortical scarring. No stones in the kidneys or ureters. No hydronephrosis. No perinephric or periureteral stranding. Urinary bladder is unremarkable. GI AND BOWEL: Stomach demonstrates no acute abnormality. No small bowel thickening or dilatation. The appendix is enlarged in caliber, measuring up to 1 cm, with associated appendiceal wall thickening. Trace periappendiceal fat stranding. Irregular approximately 15 cm in length bowel thickening of the hepatic flexure suggestive of malignancy. Associated pericolonic fat stranding and prominent benign enlarged pericolonic lymph nodes. There is no bowel obstruction. REPRODUCTIVE ORGANS: No acute abnormality. PERITONEUM AND RETROPERITONEUM: No ascites. No free air. VASCULATURE: Aorta is normal in caliber with mild to moderate atherosclerotic plaque. ABDOMINAL AND PELVIS LYMPH NODES: Prominent benign enlarged pericolonic lymph nodes associated with the hepatic flexure bowel thickening. REPRODUCTIVE ORGANS: No acute abnormality.  BONES AND SOFT TISSUES: No acute displaced rib fracture. Old healed right posterior rib fractures. Old healed posterior left rib fractures. Old healed anterior left rib fractures. No acute displaced fracture or dislocation of the partially visualized bilateral shoulders. No acute displaced fracture of the partially visualized clavicles and scapulae. No acute fracture or dislocation of either hip. No acute displaced fracture or diastasis of the bones of the pelvis. No acute displaced fracture of the sacrum. Please see separately dictated CT thoracolumbar spine 07/02/2024. Degenerative changes of the bilateral hips. Neurostimulator back within the right flank soft tissues with fistulae coursing along the  posterior canal and terminating at the T6 to T8 levels. No large soft tissue hematoma. IMPRESSION: 1. Long segment wall thickening of the hepatic flexure with prominent but not enlarged pericolonic fat stranding suggestive of malignancy. Recommend GI consultation. 2. Enlarged appendiceal caliber measuring up to 10 mm with associated mild pericolonic fat stranding. Findings may represent acute appendicitis versus less likely malignancy involvement. 3. No intrathoracic, intra-abdominal, intrapelvic traumatic injury with limited evaluation of this noncontrast study. 4. No acute displaced fracture. Electronically signed by: Morgane Naveau MD 07/02/2024 02:12 PM EST RP Workstation: [id number]   CT CERVICAL SPINE WO CONTRAST Result Date: 07/02/2024 EXAM: CT CERVICAL SPINE WITHOUT CONTRAST 07/02/2024 01:48:17 PM TECHNIQUE: CT of the cervical spine was performed without the administration of intravenous contrast. Multiplanar reformatted images are provided for review. Automated exposure control, iterative reconstruction, and/or weight based adjustment of the mA/kV was utilized to reduce the radiation dose to as low as reasonably achievable. COMPARISON: None available. CLINICAL HISTORY: Polytrauma, blunt. FINDINGS: BONES  AND ALIGNMENT: Occiput to the C4 level posterior surgical hardware. No CT findings to suggest surgical hardware complication. No acute fracture or traumatic malalignment. No severe osseous or foraminal or central canal stenosis. DEGENERATIVE CHANGES: Multilevel bulky anterior osteophyte formation that appears to be contiguous. Mild multilevel intervertebral disc space narrowing and calcification. SOFT TISSUES: No prevertebral soft tissue swelling. VASCULATURE: Atherosclerotic plaque of the aorta. IMPRESSION: 1. No evidence of acute traumatic injury. 2. Occiput to the C4 level posterior surgical hardware without CT evidence of complication. 3. 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 Electronically signed by: Morgane Naveau MD 07/02/2024 01:56 PM EST RP Workstation: [id number]   CT HEAD WO CONTRAST Result Date: 07/02/2024 EXAM: CT HEAD WITHOUT CONTRAST 07/02/2024 01:48:17 PM TECHNIQUE: CT of the head was performed without the administration of intravenous contrast. Automated exposure control, iterative reconstruction, and/or weight based adjustment of the mA/kV was utilized to reduce the radiation dose to as low as reasonably achievable. COMPARISON: CT head 09/29/2019. CLINICAL HISTORY: Head trauma, moderate-severe. FINDINGS: BRAIN AND VENTRICLES: No acute hemorrhage. No evidence of acute infarct. Nonspecific periventricular and subcortical white matter hypoattenuation, favored to reflect chronic microvascular ischemic changes. Cerebral ventricle sizes are concordant with the degree of cerebral volume loss. No extra-axial collection. No mass effect or midline shift. Limited evaluation of the cerebellum. ORBITS: Bilateral lens replacement. SINUSES: Mucous retention cyst. SOFT TISSUES AND SKULL: Postoperative changes with fixation hardware along occiput. No CT findings to suggest surgical hardware complication. Fracture originating from the surgical hardware. No acute soft tissue  abnormality. VASCULATURE: Atherosclerotic calcifications of carotid siphons and intracranial vertebral arteries. IMPRESSION: 1. No acute intracranial abnormality related to head trauma. 2. Postoperative changes with fixation hardware along the occiput without CT findings to suggest surgical hardware complication. Electronically signed by: Morgane Naveau MD 07/02/2024 01:54 PM EST RP Workstation: [id number]   DG Knee Right Port Result Date: 07/02/2024 EXAM: 1 OR 2 VIEW(S) XRAY OF THE RIGHT KNEE 07/02/2024 11:50:00 AM COMPARISON: None available. 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 No acute fracture. No malalignment. No significant joint effusion. SOFT TISSUES: Vascular calcifications are noted. IMPRESSION: 1. No evidence of acute traumatic injury. 2. 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 Electronically signed by: Morgane Naveau MD 07/02/2024 01:37 PM EST RP Workstation: [id number]   DG Hip Unilat W or Wo Pelvis 2-3 Views Right Result Date: 07/02/2024 EXAM: 2 or more VIEW(S) XRAY OF THE UNILATERAL HIP 07/02/2024 11:50:00 AM COMPARISON: CT abdomen and  pelvis 07/02/2024. CLINICAL HISTORY: fall FINDINGS: BONES AND JOINTS: No acute displaced fracture or dislocation of either hip. No acute displaced fracture or dislocation of the pelvis. 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 LUMBAR SPINE: DISCS/DEGENERATIVE CHANGES: Degenerative changes in visualized lower lumbar spine. SOFT TISSUES: Transcutaneous neurostimulator device partially visualized in place. Bilateral pelvic phleboliths. IMPRESSION: 1. No acute findings. Electronically signed by: Morgane Naveau MD 07/02/2024 01:37 PM EST RP Workstation: [id number]     Assessment & Plan:  [name] is a 78 y.o. male who is admitted with concern for colonic malignancy and possible appendicitis.  Imaging and blood work evaluated by myself.  -Upon my evaluation of the imaging, I am not impressed with  the appendiceal dilation and fat stranding.  I am more concerned with the hepatic flexure colitis and concern for underlying colonic malignancy -My initial recommendation was for nonoperative management to allow for GI to proceed with colonoscopy so as not to delay a possible cancer diagnosis, especially given no appendicolith noted on imaging.   -After evaluation by GI, they state that the patient is high risk for barotrauma during colonoscopy given his possible appendicitis, and recommend colonoscopy in 4 weeks -Given GI team's above recommendation, I decided to proceed with appendectomy, so that his colonoscopy in 4 weeks is not further delayed by persistent issues related to his appendix -Discussed the risk of robotic assisted laparoscopic appendectomy and the option of antibiotics alone. Discussed risk of surgery including but not limited to bleeding, infection, injury to other organs, normal appendix, and need for open surgery, and after this discussion, the patient has decided to proceed with surgery. -Plan for robotic assisted laparoscopic appendectomy today -NPO -Continue IV Zosyn  will need to continue this postoperatively for superimposed colitis of the hepatic flexure -Leukocytosis significantly improved today to 12.3 from 18.3 -PRN pain control and antiemetics -Further recommendations to follow surgery.  Pending postoperative course, will likely be stable for discharge from a surgical standpoint tomorrow  All questions were answered to the satisfaction of the patient.  Note: Portions of this report may have been transcribed using voice recognition software. Every effort has been made to ensure accuracy; however, inadvertent computerized transcription errors may still be present.   -- Dorothyann Brittle, DO [facility] [address] [place], [place] [address] [phone] (office)       [1]  Social History Tobacco Use   Smoking status: Never    Smokeless tobacco: Never  Vaping Use   Vaping status: Never Used  Substance Use Topics   Alcohol use: Yes   Drug use: No  [2]  Allergies Allergen Reactions   Codeine Nausea Only

## 2024-07-03 NOTE — Transfer of Care (Signed)
 Immediate Anesthesia Transfer of Care Note  Patient: [name]  Procedure(s) Performed: APPENDECTOMY, ROBOT-ASSISTED, LAPAROSCOPIC, LYSIS OF ADHESIONS  Patient Location: PACU  Anesthesia Type:General  Level of Consciousness: awake  Airway & Oxygen  Therapy: Patient Spontanous Breathing and Patient connected to face mask oxygen   Post-op Assessment: Report given to RN and Post -op Vital signs reviewed and stable  Post vital signs: Reviewed and stable  Last Vitals:  Vitals Value Taken Time  BP 148/69   Temp 98.7   Pulse 66 07/03/24 11:21  Resp 20   SpO2 97 % 07/03/24 11:21  Vitals shown include unfiled device data.  Last Pain:  Vitals:   07/03/24 0813  TempSrc: Oral  PainSc: 0-No pain      Patients Stated Pain Goal: 5 (07/03/24 0813)  Complications:  Encounter Notable Events  Notable Event Outcome Phase Comment  Difficult to intubate - expected  Intraprocedure

## 2024-07-03 NOTE — Progress Notes (Signed)
 OR today, Lives with wife, wheelchair bound.     07/03/24 1353  TOC Brief Assessment  Insurance and Status Reviewed  Patient has primary care physician Yes  Home environment has been reviewed Home with spouse  Prior level of function: independent  Prior/Current Home Services No current home services  Social Drivers of Health Review SDOH reviewed no interventions necessary  Readmission risk has been reviewed Yes  Transition of care needs no transition of care needs at this time

## 2024-07-03 NOTE — Anesthesia Postprocedure Evaluation (Signed)
"   Anesthesia Post Note  Patient: [name]  Procedure(s) Performed: APPENDECTOMY, ROBOT-ASSISTED, LAPAROSCOPIC, LYSIS OF ADHESIONS  Patient location during evaluation: PACU Anesthesia Type: General Level of consciousness: awake and alert Pain management: pain level controlled Vital Signs Assessment: post-procedure vital signs reviewed and stable Respiratory status: spontaneous breathing, nonlabored ventilation, respiratory function stable and patient connected to nasal cannula oxygen  Cardiovascular status: blood pressure returned to baseline and stable Postop Assessment: no apparent nausea or vomiting Anesthetic complications: yes   Encounter Notable Events  Notable Event Outcome Phase Comment  Difficult to intubate - expected  Intraprocedure      Last Vitals:  Vitals:   07/03/24 1121 07/03/24 1130  BP: (!) 148/69 (!) 149/69  Pulse: 66 (!) 59  Resp: 18 17  Temp: 37.1 C   SpO2: 97% 100%    Last Pain:  Vitals:   07/03/24 1130  TempSrc:   PainSc: Asleep                 Andrea Limes      "

## 2024-07-03 NOTE — Anesthesia Preprocedure Evaluation (Addendum)
"                                    Anesthesia Evaluation  Patient identified by MRN, date of birth, ID band Patient awake    Reviewed: Allergy & Precautions, H&P , NPO status , Patient's Chart, lab work & pertinent test results  History of Anesthesia Complications (+) DIFFICULT AIRWAY and history of anesthetic complications  Airway Mallampati: IV  TM Distance: >3 FB Neck ROM: Limited    Dental  (+) Poor Dentition   Pulmonary sleep apnea , pneumonia, resolved Hx aspiration pneumonia   Pulmonary exam normal breath sounds clear to auscultation       Cardiovascular hypertension, Normal cardiovascular exam+ dysrhythmias  Rhythm:Regular Rate:Normal  Echo 2021 ef 60-65%   Neuro/Psych  Neuromuscular disease  negative psych ROS   GI/Hepatic negative GI ROS,,,(+)     substance abuse  alcohol use  Endo/Other  negative endocrine ROS    Renal/GU Renal InsufficiencyRenal disease  negative genitourinary   Musculoskeletal  (+) Arthritis ,  narcotic dependentSpional cord stimulator   Abdominal   Peds negative pediatric ROS (+)  Hematology  (+) Blood dyscrasia, anemia   Anesthesia Other Findings   Reproductive/Obstetrics negative OB ROS                              Anesthesia Physical Anesthesia Plan  ASA: 4 and emergent  Anesthesia Plan: General   Post-op Pain Management:    Induction: Intravenous  PONV Risk Score and Plan:   Airway Management Planned: Oral ETT  Additional Equipment:   Intra-op Plan:   Post-operative Plan: Extubation in OR  Informed Consent: I have reviewed the patients History and Physical, chart, labs and discussed the procedure including the risks, benefits and alternatives for the proposed anesthesia with the patient or authorized representative who has indicated his/her understanding and acceptance.     Dental advisory given  Plan Discussed with: CRNA  Anesthesia Plan Comments:           Anesthesia Quick Evaluation  "

## 2024-07-03 NOTE — Discharge Instructions (Signed)
Ambulatory Surgery Discharge Instructions  General Anesthesia or Sedation Do not drive or operate heavy machinery for 24 hours.  Do not consume alcohol, tranquilizers, sleeping medications, or any non-prescribed medications for 24 hours. Do not make important decisions or sign any important papers in the next 24 hours. You should have someone with you tonight at home.  Activity  You are advised to go directly home from the hospital.  Restrict your activities and rest for a day.  Resume light activity tomorrow. No heavy lifting over 10 lbs or strenuous exercise.  Fluids and Diet Regular diet  Medications  If you have not had a bowel movement in 24 hours, take 2 tablespoons over the counter Milk of mag.             You May resume your blood thinners tomorrow (Aspirin, coumadin, or other).  You are being discharged with prescriptions for Opioid/Narcotic Medications: There are some specific considerations for these medications that you should know. Opioid Meds have risks & benefits. Addiction to these meds is always a concern with prolonged use Take medication only as directed Do not drive while taking narcotic pain medication Do not crush tablets or capsules Do not use a different container than medication was dispensed in Lock the container of medication in a cool, dry place out of reach of children and pets. Opioid medication can cause addiction Do not share with anyone else (this is a felony) Do not store medications for future use. Dispose of them properly.     Disposal:  Find a [facility] household drug take back site near you.  If you can't get to a drug take back site, use the recipe below as a last resort to dispose of expired, unused or unwanted drugs. Disposal  (Do not dispose chemotherapy drugs this way, talk to your prescribing doctor instead.) Step 1: Mix drugs (do not crush) with dirt, kitty litter, or used coffee grounds and add a small amount of water to dissolve any  solid medications. Step 2: Seal drugs in plastic bag. Step 3: Place plastic bag in trash. Step 4: Take prescription container and scratch out personal information, then recycle or throw away.  Operative Site  You have a liquid bandage over your incisions, this will begin to flake off in about a week. Ok to shower tomorrow. Keep wound clean and dry. No baths or swimming. No lifting more than 10 pounds.  Contact Information: If you have questions or concerns, please call our office, [phone], Monday- Thursday 8AM-5PM and Friday 8AM-12Noon.  If it is after hours or on the weekend, please call [facility] Main Number, [phone], and ask to speak to the surgeon on call for Dr. Brittnee Gaetano at [place].   SPECIFIC COMPLICATIONS TO WATCH FOR: Inability to urinate Fever over 101? F by mouth Nausea and vomiting lasting longer than 24 hours. Pain not relieved by medication ordered Swelling around the operative site Increased redness, warmth, hardness, around operative area Numbness, tingling, or cold fingers or toes Blood -soaked dressing, (small amounts of oozing may be normal) Increasing and progressive drainage from surgical area or exam site  

## 2024-07-03 NOTE — Plan of Care (Signed)

## 2024-07-03 NOTE — Progress Notes (Signed)
 Entered pts room, pt was holding IV catheter in hand. Stated he was not aware how it came out of place. Cleaned, washed and applied gauze. Site was intact. New 22 placed in posterior wrist of left hand. Pt tolerated well.

## 2024-07-03 NOTE — Progress Notes (Signed)
 " PROGRESS NOTE    [name]  FMW:[id number] DOB: Jan 25, 1946 DOA: 07/02/2024 PCP: Volanda Saupe, MD   Brief Narrative:   78 y.o. male with history of HTN, OSA on CPAP, CKD 3A, osteoporosis with multiple fractures from falls, wheelchair-bound, chronic pain on chronic opioid therapy, presenting after being found down at home by his wife.   Upon evaluation emergency department, WBCs 18.3, hemoglobin 8.9, lactate 2.1, creatinine 1.60, CT imaging noting no fractures however noting long segment wall thickening at the hepatic flexure of the colon concerning for malignancy as well as enlarged appendix with fat stranding.   Lap Appendectomy to be done on 07/03/2024.  Will need outpatient colonoscopy for evaluation of the colonic mass.  Assessment & Plan:  Principal Problem:   Appendicitis Active Problems:   OBSTRUCTIVE SLEEP APNEA   Chronic pain syndrome   Essential hypertension   Multiple falls   Peripheral neuropathy   Acute kidney injury superimposed on chronic kidney disease   Colonic mass   Acute on chronic blood loss anemia   Fall   Gastrointestinal hemorrhage   Acute appendicitis - CT noting enlarged appendix measuring up to 10 mm with associated pericolonic fat stranding.  Leukocytosis and abdominal right quadrant tenderness I will give concern for acute appendicitis.   - Continue with intravenous antibiotics. -Plan for laparoscopic appendectomy to be done today by general surgery -Continue with as needed analgesics.  Rest of the management as per general surgery.   Concern for GI malignancy - Noted incidentally on CT scan.  Unclear timing of last colonoscopy.  GI consulted.  Also holding off on urgent colonoscopy given acute appendicitis.   He will likely need outpatient colonoscopy-   Acute kidney injury on CKD 3A - Limited data for baseline creatinine, as low as 1.19.  Currently 1.60.  [place] prerenal etiology.  Lactate 2.1 possibly from dehydration.  Holding  home losartan .  IV fluids on board.  Will monitor urine output and recheck BMP in AM.   Chronic microcytic anemia - Previous hemoglobin 15, now 8.9.  MCV low suggestive of possible iron deficiency likely from chronic GI bleed.  Likely secondary to above malignancy.  Will order iron studies to confirm.  Monitor hemoglobin, transfuse if less than 7.   Chronic pain disorder on chronic opioid therapy - Continue with oxycodone  10 mg every 6 hours.  Also have IV opioid as needed.  Patient uses [employer] every other day for his bowels.   Hypertension - Continue to monitor closely   Multiple falls/wheelchair-bound/osteoporosis/physical deconditioning - Patient's baseline is wheelchair-bound but able to transfer to chair in bed.  Has history of multiple fractures including distal tibia, ankle, cervical with associated hardware.  May benefit from PT/OT consult after resolution of the above.   Obstructive sleep apnea - Continue with CPAP at night.   Chronic neuropathy - Home Lyrica  added.  Disposition: Home   DVT prophylaxis: SCD's Start: 07/03/24 0846 SCDs Start: 07/02/24 1550     Code Status: Limited: Do not attempt resuscitation (DNR) -DNR-LIMITED -Do Not Intubate/DNI  Family Communication: None at the bedside Status is: Inpatient Remains inpatient appropriate because: Needs appendectomy, colonic mass    Subjective:  Patient has been kept n.p.o. after midnight.  He will be going for laparoscopic appendectomy today  Examination:  General exam: Appears calm and comfortable  Respiratory system: Clear to auscultation. Respiratory effort normal. Cardiovascular system: S1 & S2 heard, RRR. No JVD, murmurs, rubs, gallops or clicks. No pedal edema. Gastrointestinal system: Abdomen is  nondistended, soft and nontender. No organomegaly or masses felt. Normal bowel sounds heard. Central nervous system: Alert and oriented. No focal neurological deficits. Extremities: Symmetric 5 x 5  power. Skin: No rashes, lesions or ulcers    Diet Orders (From admission, onward)     Start     Ordered   07/03/24 0001  Diet NPO time specified Except for: Sips with Meds  Diet effective midnight       Question:  Except for  Answer:  Sips with Meds   07/02/24 1904            Objective: Vitals:   07/03/24 0027 07/03/24 0444 07/03/24 0453 07/03/24 0813  BP: 137/61 (!) 122/51 121/63 (!) 129/59  Pulse: 69 62 60 66  Resp: 18 18 16 16   Temp: 99.2 F (37.3 C) 99 F (37.2 C) 98.8 F (37.1 C) 98.8 F (37.1 C)  TempSrc: Oral Oral Oral Oral  SpO2: 96% 95% 96% 96%  Weight:    113.4 kg  Height:    6' (1.829 m)    Intake/Output Summary (Last 24 hours) at 07/03/2024 0913 Last data filed at 07/03/2024 9357 Gross per 24 hour  Intake --  Output 1150 ml  Net -1150 ml   Filed Weights   07/03/24 0813  Weight: 113.4 kg    Scheduled Meds:  Chlorhexidine  Gluconate Cloth  6 each Topical Once   And   Chlorhexidine  Gluconate Cloth  6 each Topical Once   DULoxetine   60 mg Oral Daily   naloxegol oxalate  12.5 mg Oral QODAY   pregabalin   200 mg Oral TID   Continuous Infusions:  lactated ringers  100 mL/hr at 07/02/24 1641   lactated ringers  10 mL/hr at 07/03/24 9095   piperacillin -tazobactam (ZOSYN )  IV 3.375 g (07/03/24 0641)    Nutritional status     Body mass index is 33.91 kg/m.  Data Reviewed:   CBC: Recent Labs  Lab 07/02/24 1141 07/03/24 0433  WBC 18.3* 12.3*  HGB 8.9* 8.2*  HCT 31.0* 28.2*  MCV 78.7* 78.3*  PLT 470* 355   Basic Metabolic Panel: Recent Labs  Lab 07/02/24 1141 07/03/24 0433  NA 139 138  K 4.5 4.8  CL 103 104  CO2 18* 19*  GLUCOSE 113* 100*  BUN 27* 29*  CREATININE 1.60* 1.50*  CALCIUM  9.2 8.9  MG 2.0  --    GFR: Estimated Creatinine Clearance: 54.5 mL/min (A) (by C-G formula based on SCr of 1.5 mg/dL (H)). Liver Function Tests: Recent Labs  Lab 07/02/24 1141 07/03/24 0433  AST 26 27  ALT 22 22  ALKPHOS 105 104  BILITOT  0.8 0.6  PROT 7.8 7.1  ALBUMIN 3.9 3.5   No results for input(s): LIPASE, AMYLASE in the last 168 hours. No results for input(s): AMMONIA in the last 168 hours. Coagulation Profile: Recent Labs  Lab 07/02/24 1141 07/03/24 0433  INR 1.1 1.2   Cardiac Enzymes: Recent Labs  Lab 07/02/24 1141  CKTOTAL 176   BNP (last 3 results) No results for input(s): PROBNP in the last 8760 hours. HbA1C: No results for input(s): HGBA1C in the last 72 hours. CBG: Recent Labs  Lab 07/02/24 1112  GLUCAP 113*   Lipid Profile: No results for input(s): CHOL, HDL, LDLCALC, TRIG, CHOLHDL, LDLDIRECT in the last 72 hours. Thyroid Function Tests: No results for input(s): TSH, T4TOTAL, FREET4, T3FREE, THYROIDAB in the last 72 hours. Anemia Panel: Recent Labs    07/02/24 1141  VITAMINB12 674  FOLATE >20.0  FERRITIN 33  TIBC 377  IRON 12*  RETICCTPCT 2.2   Sepsis Labs: Recent Labs  Lab 07/02/24 1141  LATICACIDVEN 2.1*    No results found for this or any previous visit (from the past 240 hours).       Radiology Studies: CT L-SPINE NO CHARGE Result Date: 07/02/2024 EXAM: CT OF THE LUMBAR SPINE WITHOUT CONTRAST 07/02/2024 01:48:17 PM TECHNIQUE: CT of the lumbar spine was performed without the administration of intravenous contrast. Multiplanar reformatted images are provided for review. Automated exposure control, iterative reconstruction, and/or weight based adjustment of the mA/kV was utilized to reduce the radiation dose to as low as reasonably achievable. COMPARISON: Of the lumbar spine 08/11/2020 CLINICAL HISTORY: FINDINGS: BONES AND ALIGNMENT: Normal vertebral body heights. No acute fracture or suspicious bone lesion. Normal alignment. Multilevel bulky anterior osteophyte formation. DEGENERATIVE CHANGES: L5-S1 intervertebral disc space narrowing. Posterior disc osteophyte complex at the L5-S1 level. At least moderate left L5-S1 osseous neural foraminal  stenosis. SOFT TISSUES: No acute abnormality. IMPRESSION: 1. No acute fracture. Electronically signed by: Morgane Naveau MD 07/02/2024 02:36 PM EST RP Workstation: [id number]   CT T-SPINE NO CHARGE Result Date: 07/02/2024 EXAM: CT THORACIC SPINE WITHOUT CONTRAST 07/02/2024 01:48:17 PM TECHNIQUE: CT of the thoracic spine was performed without the administration of intravenous contrast. Multiplanar reformatted images are provided for review. Automated exposure control, iterative reconstruction, and/or weight based adjustment of the mA/kV was utilized to reduce the radiation dose to as low as reasonably achievable. COMPARISON: CT chest 10/07/2019. CLINICAL HISTORY: FINDINGS: BONES AND ALIGNMENT: Redemonstration of exaggerating kyphotic curvature centered at the thoracic spine T7 level due to chronic compression fracture. T7 compression fracture is chronic, status post kyphoplasty. T6 and T10 chronic fracture with kyphoplasty. No acute fracture identified. No suspicious bone lesion. DEGENERATIVE CHANGES: Multilevel severe degenerative changes of the spine with bulky anterior contiguous osteophytes. Multilevel mild intervertebral disc space narrowing. Severe osseous neural foraminal stenosis at the T7-T8 levels. At least moderate osseous central canal stenosis at the T7 level. SOFT TISSUES: No acute abnormality. IMPRESSION: 1. No acute thoracic spine fracture. 2. Severe osseous neural foraminal stenosis at the T7-T8 levels. 3. At least moderate osseous central canal stenosis at the T7 level. Electronically signed by: Morgane Naveau MD 07/02/2024 02:19 PM EST RP Workstation: [id number]   CT CHEST ABDOMEN PELVIS WO CONTRAST Result Date: 07/02/2024 EXAM: CT CHEST, ABDOMEN AND PELVIS WITHOUT CONTRAST 07/02/2024 01:48:17 PM TECHNIQUE: CT of the chest, abdomen and pelvis was performed without the administration of intravenous contrast. Multiplanar reformatted images are provided for review. Automated exposure control,  iterative reconstruction, and/or weight based adjustment of the mA/kV was utilized to reduce the radiation dose to as low as reasonably achievable. COMPARISON: None available. CLINICAL HISTORY: Polytrauma, blunt. FINDINGS: CHEST: MEDIASTINUM AND LYMPH NODES: Heart and pericardium are unremarkable. The central airways are clear. No mediastinal, hilar or axillary lymphadenopathy. The main pulmonary artery is enlarged with a caliber measuring up to 3.4 cm. At least 3-vessel coronary artery calcifications. The esophagus is unremarkable. LUNGS AND PLEURA: No focal consolidation or pulmonary edema. No pleural effusion or pneumothorax. ABDOMEN AND PELVIS: LIVER: The liver is unremarkable. GALLBLADDER AND BILE DUCTS: Status post cholecystectomy. No biliary ductal dilatation. SPLEEN: No acute abnormality. PANCREAS: No acute abnormality. ADRENAL GLANDS: No acute abnormality. KIDNEYS, URETERS AND BLADDER: Bilateral renal cortical scarring. No stones in the kidneys or ureters. No hydronephrosis. No perinephric or periureteral stranding. Urinary bladder is unremarkable. GI AND BOWEL: Stomach demonstrates no acute abnormality. No small bowel thickening  or dilatation. The appendix is enlarged in caliber, measuring up to 1 cm, with associated appendiceal wall thickening. Trace periappendiceal fat stranding. Irregular approximately 15 cm in length bowel thickening of the hepatic flexure suggestive of malignancy. Associated pericolonic fat stranding and prominent benign enlarged pericolonic lymph nodes. There is no bowel obstruction. REPRODUCTIVE ORGANS: No acute abnormality. PERITONEUM AND RETROPERITONEUM: No ascites. No free air. VASCULATURE: Aorta is normal in caliber with mild to moderate atherosclerotic plaque. ABDOMINAL AND PELVIS LYMPH NODES: Prominent benign enlarged pericolonic lymph nodes associated with the hepatic flexure bowel thickening. REPRODUCTIVE ORGANS: No acute abnormality. BONES AND SOFT TISSUES: No acute  displaced rib fracture. Old healed right posterior rib fractures. Old healed posterior left rib fractures. Old healed anterior left rib fractures. No acute displaced fracture or dislocation of the partially visualized bilateral shoulders. No acute displaced fracture of the partially visualized clavicles and scapulae. No acute fracture or dislocation of either hip. No acute displaced fracture or diastasis of the bones of the pelvis. No acute displaced fracture of the sacrum. Please see separately dictated CT thoracolumbar spine 07/02/2024. Degenerative changes of the bilateral hips. Neurostimulator back within the right flank soft tissues with fistulae coursing along the posterior canal and terminating at the T6 to T8 levels. No large soft tissue hematoma. IMPRESSION: 1. Long segment wall thickening of the hepatic flexure with prominent but not enlarged pericolonic fat stranding suggestive of malignancy. Recommend GI consultation. 2. Enlarged appendiceal caliber measuring up to 10 mm with associated mild pericolonic fat stranding. Findings may represent acute appendicitis versus less likely malignancy involvement. 3. No intrathoracic, intra-abdominal, intrapelvic traumatic injury with limited evaluation of this noncontrast study. 4. No acute displaced fracture. Electronically signed by: Morgane Naveau MD 07/02/2024 02:12 PM EST RP Workstation: [id number]   CT CERVICAL SPINE WO CONTRAST Result Date: 07/02/2024 EXAM: CT CERVICAL SPINE WITHOUT CONTRAST 07/02/2024 01:48:17 PM TECHNIQUE: CT of the cervical spine was performed without the administration of intravenous contrast. Multiplanar reformatted images are provided for review. Automated exposure control, iterative reconstruction, and/or weight based adjustment of the mA/kV was utilized to reduce the radiation dose to as low as reasonably achievable. COMPARISON: None available. CLINICAL HISTORY: Polytrauma, blunt. FINDINGS: BONES AND ALIGNMENT: Occiput to the C4  level posterior surgical hardware. No CT findings to suggest surgical hardware complication. No acute fracture or traumatic malalignment. No severe osseous or foraminal or central canal stenosis. DEGENERATIVE CHANGES: Multilevel bulky anterior osteophyte formation that appears to be contiguous. Mild multilevel intervertebral disc space narrowing and calcification. SOFT TISSUES: No prevertebral soft tissue swelling. VASCULATURE: Atherosclerotic plaque of the aorta. IMPRESSION: 1. No evidence of acute traumatic injury. 2. Occiput to the C4 level posterior surgical hardware without CT evidence of complication. 3. 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 Electronically signed by: Morgane Naveau MD 07/02/2024 01:56 PM EST RP Workstation: [id number]   CT HEAD WO CONTRAST Result Date: 07/02/2024 EXAM: CT HEAD WITHOUT CONTRAST 07/02/2024 01:48:17 PM TECHNIQUE: CT of the head was performed without the administration of intravenous contrast. Automated exposure control, iterative reconstruction, and/or weight based adjustment of the mA/kV was utilized to reduce the radiation dose to as low as reasonably achievable. COMPARISON: CT head 09/29/2019. CLINICAL HISTORY: Head trauma, moderate-severe. FINDINGS: BRAIN AND VENTRICLES: No acute hemorrhage. No evidence of acute infarct. Nonspecific periventricular and subcortical white matter hypoattenuation, favored to reflect chronic microvascular ischemic changes. Cerebral ventricle sizes are concordant with the degree of cerebral volume loss. No extra-axial collection. No mass effect or midline shift. Limited  evaluation of the cerebellum. ORBITS: Bilateral lens replacement. SINUSES: Mucous retention cyst. SOFT TISSUES AND SKULL: Postoperative changes with fixation hardware along occiput. No CT findings to suggest surgical hardware complication. Fracture originating from the surgical hardware. No acute soft tissue abnormality. VASCULATURE:  Atherosclerotic calcifications of carotid siphons and intracranial vertebral arteries. IMPRESSION: 1. No acute intracranial abnormality related to head trauma. 2. Postoperative changes with fixation hardware along the occiput without CT findings to suggest surgical hardware complication. Electronically signed by: Morgane Naveau MD 07/02/2024 01:54 PM EST RP Workstation: [id number]   DG Knee Right Port Result Date: 07/02/2024 EXAM: 1 OR 2 VIEW(S) XRAY OF THE RIGHT KNEE 07/02/2024 11:50:00 AM COMPARISON: None available. 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 No acute fracture. No malalignment. No significant joint effusion. SOFT TISSUES: Vascular calcifications are noted. IMPRESSION: 1. No evidence of acute traumatic injury. 2. 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 Electronically signed by: Morgane Naveau MD 07/02/2024 01:37 PM EST RP Workstation: [id number]   DG Hip Unilat W or Wo Pelvis 2-3 Views Right Result Date: 07/02/2024 EXAM: 2 or more VIEW(S) XRAY OF THE UNILATERAL HIP 07/02/2024 11:50:00 AM COMPARISON: CT abdomen and pelvis 07/02/2024. CLINICAL HISTORY: fall FINDINGS: BONES AND JOINTS: No acute displaced fracture or dislocation of either hip. No acute displaced fracture or dislocation of the pelvis. 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 LUMBAR SPINE: DISCS/DEGENERATIVE CHANGES: Degenerative changes in visualized lower lumbar spine. SOFT TISSUES: Transcutaneous neurostimulator device partially visualized in place. Bilateral pelvic phleboliths. IMPRESSION: 1. No acute findings. Electronically signed by: Morgane Naveau MD 07/02/2024 01:37 PM EST RP Workstation: [id number]           LOS: 1 day   Time spent= 35 mins    Deliliah Room, MD [facility]  If 7PM-7AM, please contact night-coverage  07/03/2024, 9:13 AM  "

## 2024-07-04 ENCOUNTER — Encounter (HOSPITAL_COMMUNITY): Payer: Self-pay | Admitting: Surgery

## 2024-07-04 DIAGNOSIS — K353 Acute appendicitis with localized peritonitis, without perforation or gangrene: Secondary | ICD-10-CM | POA: Diagnosis not present

## 2024-07-04 LAB — BASIC METABOLIC PANEL WITH GFR
Anion gap: 10 (ref 5–15)
BUN: 28 mg/dL — ABNORMAL HIGH (ref 8–23)
CO2: 25 mmol/L (ref 22–32)
Calcium: 8.6 mg/dL — ABNORMAL LOW (ref 8.9–10.3)
Chloride: 103 mmol/L (ref 98–111)
Creatinine, Ser: 1.57 mg/dL — ABNORMAL HIGH (ref 0.61–1.24)
GFR, Estimated: 45 mL/min — ABNORMAL LOW
Glucose, Bld: 160 mg/dL — ABNORMAL HIGH (ref 70–99)
Potassium: 4.7 mmol/L (ref 3.5–5.1)
Sodium: 138 mmol/L (ref 135–145)

## 2024-07-04 LAB — CBC WITH DIFFERENTIAL/PLATELET
Abs Immature Granulocytes: 0.06 K/uL (ref 0.00–0.07)
Basophils Absolute: 0 K/uL (ref 0.0–0.1)
Basophils Relative: 0 %
Eosinophils Absolute: 0 K/uL (ref 0.0–0.5)
Eosinophils Relative: 0 %
HCT: 28.2 % — ABNORMAL LOW (ref 39.0–52.0)
Hemoglobin: 7.9 g/dL — ABNORMAL LOW (ref 13.0–17.0)
Immature Granulocytes: 1 %
Lymphocytes Relative: 10 %
Lymphs Abs: 1 K/uL (ref 0.7–4.0)
MCH: 22.5 pg — ABNORMAL LOW (ref 26.0–34.0)
MCHC: 28 g/dL — ABNORMAL LOW (ref 30.0–36.0)
MCV: 80.3 fL (ref 80.0–100.0)
Monocytes Absolute: 0.5 K/uL (ref 0.1–1.0)
Monocytes Relative: 5 %
Neutro Abs: 8.1 K/uL — ABNORMAL HIGH (ref 1.7–7.7)
Neutrophils Relative %: 84 %
Platelets: 346 K/uL (ref 150–400)
RBC: 3.51 MIL/uL — ABNORMAL LOW (ref 4.22–5.81)
RDW: 17.1 % — ABNORMAL HIGH (ref 11.5–15.5)
WBC: 9.6 K/uL (ref 4.0–10.5)
nRBC: 0.2 % (ref 0.0–0.2)

## 2024-07-04 MED ORDER — AMOXICILLIN-POT CLAVULANATE 875-125 MG PO TABS: 1.0000 | ORAL_TABLET | Freq: Two times a day (BID) | ORAL | 0 refills | Status: DC

## 2024-07-04 MED ORDER — LOSARTAN POTASSIUM 25 MG PO TABS: 12.5000 mg | ORAL_TABLET | Freq: Every day | ORAL | Status: AC

## 2024-07-04 NOTE — Discharge Summary (Signed)
 " Physician Discharge Summary   Patient: [name] MRN: [id number] DOB: 1945/12/20  Admit date:     07/02/2024  Discharge date: 07/04/2024  Discharge Physician: Deliliah Room   PCP: Volanda Saupe, MD   Recommendations at discharge:    F/u with your PCP in one week F/u with general surgery in 2 weeks F/u with GI in 4 weeks for scheduling a colonoscopy.  Discharge Diagnoses: Principal Problem:   Appendicitis Active Problems:   OBSTRUCTIVE SLEEP APNEA   Chronic pain syndrome   Essential hypertension   Multiple falls   Peripheral neuropathy   Acute kidney injury superimposed on chronic kidney disease   Colonic mass   Acute on chronic blood loss anemia   Fall   Gastrointestinal hemorrhage   Intra-abdominal adhesions  Hospital Course:  79 y.o. male with history of HTN, OSA on CPAP, CKD 3A, osteoporosis with multiple fractures from falls, wheelchair-bound, chronic pain on chronic opioid therapy, presenting after being found down at home by his wife.    Upon evaluation in the emergency department, WBCs 18.3, hemoglobin 8.9, lactate 2.1, creatinine 1.60, CT imaging noting no fractures however noting long segment wall thickening at the hepatic flexure of the colon concerning for malignancy as well as enlarged appendix with fat stranding.      Acute appendicitis - CT noting enlarged appendix measuring up to 10 mm with associated pericolonic fat stranding.  Leukocytosis and abdominal right quadrant tenderness I will give concern for acute appendicitis.   -  Robotic assisted laparoscopic appendectomy, lysis of adhesions done on 07/03/2024 by Dr.Pappayliou  No complications noted. Tolerating diet. Discussed with Dr Evonnie on the day of the discharge and patient was given a prescription for oral augmentin  for colitis for a total of 10 days. -Outpatient follow up with him in 2 weeks.   Concern for GI malignancy - Noted incidentally on CT scan.  Unclear timing of last  colonoscopy.  GI consulted.  Also holding off on urgent colonoscopy given acute appendicitis.   He will need outpatient colonoscopy in 4 weeks. -GI will arrange follow up.   Acute kidney injury on CKD 3A - Stable   Chronic microcytic anemia - Previous hemoglobin 15, now 8.9.  MCV low suggestive of possible iron deficiency likely from chronic GI bleed.  Likely secondary to above malignancy.     Chronic pain disorder on chronic opioid therapy - Continue with oxycodone  10 mg every 6 hours.    Hypertension - Continue to monitor closely   Multiple falls/wheelchair-bound/osteoporosis/physical deconditioning - Patient's baseline is wheelchair-bound but able to transfer to chair in bed.  Has history of multiple fractures including distal tibia, ankle, cervical with associated hardware.   Obstructive sleep apnea - Continue with CPAP at night.   Chronic neuropathy - Home Lyrica    Disposition: Home. Lives with his wife.       Consultants: GI, Surgery Procedures performed: Lap appendectomy  Disposition: Home Diet recommendation:  Cardiac diet DISCHARGE MEDICATION: Allergies as of 07/04/2024       Reactions   Codeine Nausea Only        Medication List     TAKE these medications    acetaminophen  325 MG tablet Commonly known as: TYLENOL  Take 2 tablets (650 mg total) by mouth every 4 (four) hours as needed for mild pain (or temp > 37.5 C (99.5 F)).   amLODipine  10 MG tablet Commonly known as: NORVASC  Take 10 mg by mouth daily.   amoxicillin -clavulanate 875-125 MG tablet Commonly known  as: AUGMENTIN  Take 1 tablet by mouth 2 (two) times daily.   bacitracin ointment Apply 1 Application topically 2 (two) times daily as needed for wound care.   DULoxetine  30 MG capsule Commonly known as: CYMBALTA  Take 60 mg by mouth daily.   losartan  25 MG tablet Commonly known as: COZAAR  Take 0.5 tablets (12.5 mg total) by mouth daily.   naloxegol oxalate 25 MG Tabs tablet Commonly  known as: MOVANTIK Take 12.5 mg by mouth daily.   naloxone  4 MG/0.1ML Liqd nasal spray kit Commonly known as: NARCAN  Place 1 spray into the nose once.   oxyCODONE -acetaminophen  10-325 MG tablet Commonly known as: PERCOCET Take 1 tablet by mouth every 6 (six) hours as needed for pain.   patiromer 8.4 g packet Commonly known as: VELTASSA Take 8.4 g by mouth daily. Take 3 hours before or after other medication   pregabalin  150 MG capsule Commonly known as: LYRICA  Take 200 mg by mouth 3 (three) times daily.   tamsulosin  0.4 MG Caps capsule Commonly known as: FLOMAX  Take 1 capsule (0.4 mg total) by mouth at bedtime.        Follow-up Information     Pappayliou, Dorothyann LABOR, DO. Call.   Specialty: General Surgery Why: Call to schedule follow up in 2 weeks Contact information: [address] [facility] [address] [phone]         Volanda Saupe, MD. Schedule an appointment as soon as possible for a visit in 1 week(s).   Specialty: Family Medicine Contact information: [address] [place] [address] [phone]         Cinderella Deatrice FALCON, MD. Schedule an appointment as soon as possible for a visit in 1 month(s).   Specialty: Gastroenterology Contact information: [address] [place] [address] [phone]                Discharge Exam: Fredricka Weights   07/03/24 0813  Weight: 113.4 kg   Constitutional: NAD, calm, comfortable Eyes: PERRL, lids and conjunctivae normal ENMT: Mucous membranes are moist. Posterior pharynx clear of any exudate or lesions.Normal dentition.  Neck: normal, supple, no masses, no thyromegaly Respiratory: clear to auscultation bilaterally, no wheezing, no crackles. Normal respiratory effort. No accessory muscle use.  Cardiovascular: Regular rate and rhythm, no murmurs / rubs / gallops. No extremity edema. 2+ pedal pulses. No carotid bruits.  Abdomen: laparoscopic incision sites C/D/I   Musculoskeletal: no clubbing / cyanosis. No joint deformity upper and lower extremities. Good ROM, no contractures. Normal muscle tone.  Skin: no rashes, lesions, ulcers. No induration Neurologic: CN 2-12 grossly intact. Sensation intact, DTR normal. Strength 5/5 x all 4 extremities.   Condition at discharge: good  The results of significant diagnostics from this hospitalization (including imaging, microbiology, ancillary and laboratory) are listed below for reference.   Imaging Studies: CT L-SPINE NO CHARGE Result Date: 07/02/2024 EXAM: CT OF THE LUMBAR SPINE WITHOUT CONTRAST 07/02/2024 01:48:17 PM TECHNIQUE: CT of the lumbar spine was performed without the administration of intravenous contrast. Multiplanar reformatted images are provided for review. Automated exposure control, iterative reconstruction, and/or weight based adjustment of the mA/kV was utilized to reduce the radiation dose to as low as reasonably achievable. COMPARISON: Of the lumbar spine 08/11/2020 CLINICAL HISTORY: FINDINGS: BONES AND ALIGNMENT: Normal vertebral body heights. No acute fracture or suspicious bone lesion. Normal alignment. Multilevel bulky anterior osteophyte formation. DEGENERATIVE CHANGES: L5-S1 intervertebral disc space narrowing. Posterior disc osteophyte complex at the L5-S1 level. At least moderate  left L5-S1 osseous neural foraminal stenosis. SOFT TISSUES: No acute abnormality. IMPRESSION: 1. No acute fracture. Electronically signed by: Morgane Naveau MD 07/02/2024 02:36 PM EST RP Workstation: [id number]   CT T-SPINE NO CHARGE Result Date: 07/02/2024 EXAM: CT THORACIC SPINE WITHOUT CONTRAST 07/02/2024 01:48:17 PM TECHNIQUE: CT of the thoracic spine was performed without the administration of intravenous contrast. Multiplanar reformatted images are provided for review. Automated exposure control, iterative reconstruction, and/or weight based adjustment of the mA/kV was utilized to reduce the radiation dose to  as low as reasonably achievable. COMPARISON: CT chest 10/07/2019. CLINICAL HISTORY: FINDINGS: BONES AND ALIGNMENT: Redemonstration of exaggerating kyphotic curvature centered at the thoracic spine T7 level due to chronic compression fracture. T7 compression fracture is chronic, status post kyphoplasty. T6 and T10 chronic fracture with kyphoplasty. No acute fracture identified. No suspicious bone lesion. DEGENERATIVE CHANGES: Multilevel severe degenerative changes of the spine with bulky anterior contiguous osteophytes. Multilevel mild intervertebral disc space narrowing. Severe osseous neural foraminal stenosis at the T7-T8 levels. At least moderate osseous central canal stenosis at the T7 level. SOFT TISSUES: No acute abnormality. IMPRESSION: 1. No acute thoracic spine fracture. 2. Severe osseous neural foraminal stenosis at the T7-T8 levels. 3. At least moderate osseous central canal stenosis at the T7 level. Electronically signed by: Morgane Naveau MD 07/02/2024 02:19 PM EST RP Workstation: [id number]   CT CHEST ABDOMEN PELVIS WO CONTRAST Result Date: 07/02/2024 EXAM: CT CHEST, ABDOMEN AND PELVIS WITHOUT CONTRAST 07/02/2024 01:48:17 PM TECHNIQUE: CT of the chest, abdomen and pelvis was performed without the administration of intravenous contrast. Multiplanar reformatted images are provided for review. Automated exposure control, iterative reconstruction, and/or weight based adjustment of the mA/kV was utilized to reduce the radiation dose to as low as reasonably achievable. COMPARISON: None available. CLINICAL HISTORY: Polytrauma, blunt. FINDINGS: CHEST: MEDIASTINUM AND LYMPH NODES: Heart and pericardium are unremarkable. The central airways are clear. No mediastinal, hilar or axillary lymphadenopathy. The main pulmonary artery is enlarged with a caliber measuring up to 3.4 cm. At least 3-vessel coronary artery calcifications. The esophagus is unremarkable. LUNGS AND PLEURA: No focal consolidation or  pulmonary edema. No pleural effusion or pneumothorax. ABDOMEN AND PELVIS: LIVER: The liver is unremarkable. GALLBLADDER AND BILE DUCTS: Status post cholecystectomy. No biliary ductal dilatation. SPLEEN: No acute abnormality. PANCREAS: No acute abnormality. ADRENAL GLANDS: No acute abnormality. KIDNEYS, URETERS AND BLADDER: Bilateral renal cortical scarring. No stones in the kidneys or ureters. No hydronephrosis. No perinephric or periureteral stranding. Urinary bladder is unremarkable. GI AND BOWEL: Stomach demonstrates no acute abnormality. No small bowel thickening or dilatation. The appendix is enlarged in caliber, measuring up to 1 cm, with associated appendiceal wall thickening. Trace periappendiceal fat stranding. Irregular approximately 15 cm in length bowel thickening of the hepatic flexure suggestive of malignancy. Associated pericolonic fat stranding and prominent benign enlarged pericolonic lymph nodes. There is no bowel obstruction. REPRODUCTIVE ORGANS: No acute abnormality. PERITONEUM AND RETROPERITONEUM: No ascites. No free air. VASCULATURE: Aorta is normal in caliber with mild to moderate atherosclerotic plaque. ABDOMINAL AND PELVIS LYMPH NODES: Prominent benign enlarged pericolonic lymph nodes associated with the hepatic flexure bowel thickening. REPRODUCTIVE ORGANS: No acute abnormality. BONES AND SOFT TISSUES: No acute displaced rib fracture. Old healed right posterior rib fractures. Old healed posterior left rib fractures. Old healed anterior left rib fractures. No acute displaced fracture or dislocation of the partially visualized bilateral shoulders. No acute displaced fracture of the partially visualized clavicles and scapulae. No acute fracture or dislocation of either hip. No acute  displaced fracture or diastasis of the bones of the pelvis. No acute displaced fracture of the sacrum. Please see separately dictated CT thoracolumbar spine 07/02/2024. Degenerative changes of the bilateral hips.  Neurostimulator back within the right flank soft tissues with fistulae coursing along the posterior canal and terminating at the T6 to T8 levels. No large soft tissue hematoma. IMPRESSION: 1. Long segment wall thickening of the hepatic flexure with prominent but not enlarged pericolonic fat stranding suggestive of malignancy. Recommend GI consultation. 2. Enlarged appendiceal caliber measuring up to 10 mm with associated mild pericolonic fat stranding. Findings may represent acute appendicitis versus less likely malignancy involvement. 3. No intrathoracic, intra-abdominal, intrapelvic traumatic injury with limited evaluation of this noncontrast study. 4. No acute displaced fracture. Electronically signed by: Morgane Naveau MD 07/02/2024 02:12 PM EST RP Workstation: [id number]   CT CERVICAL SPINE WO CONTRAST Result Date: 07/02/2024 EXAM: CT CERVICAL SPINE WITHOUT CONTRAST 07/02/2024 01:48:17 PM TECHNIQUE: CT of the cervical spine was performed without the administration of intravenous contrast. Multiplanar reformatted images are provided for review. Automated exposure control, iterative reconstruction, and/or weight based adjustment of the mA/kV was utilized to reduce the radiation dose to as low as reasonably achievable. COMPARISON: None available. CLINICAL HISTORY: Polytrauma, blunt. FINDINGS: BONES AND ALIGNMENT: Occiput to the C4 level posterior surgical hardware. No CT findings to suggest surgical hardware complication. No acute fracture or traumatic malalignment. No severe osseous or foraminal or central canal stenosis. DEGENERATIVE CHANGES: Multilevel bulky anterior osteophyte formation that appears to be contiguous. Mild multilevel intervertebral disc space narrowing and calcification. SOFT TISSUES: No prevertebral soft tissue swelling. VASCULATURE: Atherosclerotic plaque of the aorta. IMPRESSION: 1. No evidence of acute traumatic injury. 2. Occiput to the C4 level posterior surgical hardware without CT  evidence of complication. 3. 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 Electronically signed by: Morgane Naveau MD 07/02/2024 01:56 PM EST RP Workstation: [id number]   CT HEAD WO CONTRAST Result Date: 07/02/2024 EXAM: CT HEAD WITHOUT CONTRAST 07/02/2024 01:48:17 PM TECHNIQUE: CT of the head was performed without the administration of intravenous contrast. Automated exposure control, iterative reconstruction, and/or weight based adjustment of the mA/kV was utilized to reduce the radiation dose to as low as reasonably achievable. COMPARISON: CT head 09/29/2019. CLINICAL HISTORY: Head trauma, moderate-severe. FINDINGS: BRAIN AND VENTRICLES: No acute hemorrhage. No evidence of acute infarct. Nonspecific periventricular and subcortical white matter hypoattenuation, favored to reflect chronic microvascular ischemic changes. Cerebral ventricle sizes are concordant with the degree of cerebral volume loss. No extra-axial collection. No mass effect or midline shift. Limited evaluation of the cerebellum. ORBITS: Bilateral lens replacement. SINUSES: Mucous retention cyst. SOFT TISSUES AND SKULL: Postoperative changes with fixation hardware along occiput. No CT findings to suggest surgical hardware complication. Fracture originating from the surgical hardware. No acute soft tissue abnormality. VASCULATURE: Atherosclerotic calcifications of carotid siphons and intracranial vertebral arteries. IMPRESSION: 1. No acute intracranial abnormality related to head trauma. 2. Postoperative changes with fixation hardware along the occiput without CT findings to suggest surgical hardware complication. Electronically signed by: Morgane Naveau MD 07/02/2024 01:54 PM EST RP Workstation: [id number]   DG Knee Right Port Result Date: 07/02/2024 EXAM: 1 OR 2 VIEW(S) XRAY OF THE RIGHT KNEE 07/02/2024 11:50:00 AM COMPARISON: None available. 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 No acute fracture. No malalignment. No significant joint effusion. SOFT TISSUES: Vascular calcifications are noted. IMPRESSION: 1. No evidence of acute traumatic injury. 2. 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  Electronically signed by: Morgane Naveau MD 07/02/2024 01:37 PM EST RP Workstation: [id number]   DG Hip Unilat W or Wo Pelvis 2-3 Views Right Result Date: 07/02/2024 EXAM: 2 or more VIEW(S) XRAY OF THE UNILATERAL HIP 07/02/2024 11:50:00 AM COMPARISON: CT abdomen and pelvis 07/02/2024. CLINICAL HISTORY: fall FINDINGS: BONES AND JOINTS: No acute displaced fracture or dislocation of either hip. No acute displaced fracture or dislocation of the pelvis. 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 LUMBAR SPINE: DISCS/DEGENERATIVE CHANGES: Degenerative changes in visualized lower lumbar spine. SOFT TISSUES: Transcutaneous neurostimulator device partially visualized in place. Bilateral pelvic phleboliths. IMPRESSION: 1. No acute findings. Electronically signed by: Morgane Naveau MD 07/02/2024 01:37 PM EST RP Workstation: [id number]    Microbiology: Results for orders placed or performed during the hospital encounter of 07/20/18  Urine culture     Status: None   Collection Time: 07/20/18  9:28 AM   Specimen: Urine, Catheterized  Result Value Ref Range Status   Specimen Description   Final    URINE, CATHETERIZED Performed at [facility], [address] [place], [place] [address]    Special Requests   Final    NONE Performed at [facility], [address] [place], [place] [address]    Culture   Final    NO GROWTH Performed at [facility], [address] [place], [place] [address]    Report Status 07/21/2018 FINAL  Final  MRSA PCR Screening     Status: Abnormal   Collection Time: 07/20/18  5:34 PM   Specimen: Nasal Mucosa; Nasopharyngeal  Result Value Ref Range Status   MRSA by PCR POSITIVE (A) NEGATIVE Final    Comment:        The GeneXpert MRSA Assay  (FDA approved for NASAL specimens only), is one component of a comprehensive MRSA colonization surveillance program. It is not intended to diagnose MRSA infection nor to guide or monitor treatment for MRSA infections. RESULT CALLED TO, READ BACK BY AND VERIFIED WITH: J.RHEW @ 2302 ON 1.17.20 BY LBB Performed at [facility], [address], [place], [place] [address]   Culture, blood (routine x 2)     Status: None   Collection Time: 07/21/18 12:12 PM   Specimen: BLOOD LEFT WRIST  Result Value Ref Range Status   Specimen Description BLOOD LEFT WRIST  Final   Special Requests   Final    BOTTLES DRAWN AEROBIC AND ANAEROBIC Blood Culture adequate volume   Culture   Final    NO GROWTH 5 DAYS Performed at [facility], [address], [place], [place] [address]    Report Status 07/26/2018 FINAL  Final  Culture, blood (routine x 2)     Status: None   Collection Time: 07/21/18 12:12 PM   Specimen: BLOOD RIGHT HAND  Result Value Ref Range Status   Specimen Description BLOOD RIGHT HAND  Final   Special Requests   Final    BOTTLES DRAWN AEROBIC AND ANAEROBIC Blood Culture adequate volume   Culture   Final    NO GROWTH 5 DAYS Performed at [facility], [address] [place], [place] [address]    Report Status 07/26/2018 FINAL  Final  C difficile quick scan w PCR reflex     Status: None   Collection Time: 07/25/18  9:44 AM   Specimen: STOOL  Result Value Ref Range Status   C Diff antigen NEGATIVE NEGATIVE Final   C Diff toxin NEGATIVE NEGATIVE Final   C Diff interpretation No C. difficile detected.  Final    Comment: Performed at [facility], [address] [place], [place] [address]    Labs: CBC: Recent Labs  Lab 07/02/24 1141 07/03/24 0433 07/04/24 0433  WBC 18.3* 12.3* 9.6  NEUTROABS  --   --  8.1*  HGB 8.9* 8.2* 7.9*  HCT 31.0* 28.2* 28.2*  MCV 78.7* 78.3* 80.3  PLT 470* 355 346   Basic Metabolic Panel: Recent Labs  Lab 07/02/24 1141 07/03/24 0433  07/04/24 0433  NA 139 138 138  K 4.5 4.8 4.7  CL 103 104 103  CO2 18* 19* 25  GLUCOSE 113* 100* 160*  BUN 27* 29* 28*  CREATININE 1.60* 1.50* 1.57*  CALCIUM  9.2 8.9 8.6*  MG 2.0  --   --    Liver Function Tests: Recent Labs  Lab 07/02/24 1141 07/03/24 0433  AST 26 27  ALT 22 22  ALKPHOS 105 104  BILITOT 0.8 0.6  PROT 7.8 7.1  ALBUMIN 3.9 3.5   CBG: Recent Labs  Lab 07/02/24 1112  GLUCAP 113*    Discharge time spent: greater than 30 minutes.  Signed: Deliliah Room, MD [facility] 07/04/2024 "

## 2024-07-04 NOTE — Progress Notes (Signed)
 [facility] Progress Note  1 Day Post-Op  Subjective: Patient seen and examined.  He is resting comfortably in bed.  He tolerated his diet with no nausea and vomiting.  He confirms that he has been passing flatus since surgery.  He denies any significant abdominal pain.  Objective: Vital signs in last 24 hours: Temp:  [97.6 F (36.4 C)-97.7 F (36.5 C)] 97.6 F (36.4 C) (01/01 0439) Pulse Rate:  [52-67] 54 (01/01 0439) Resp:  [18-19] 19 (01/01 0439) BP: (129-146)/(51-52) 129/52 (01/01 0439) SpO2:  [94 %-98 %] 98 % (01/01 0439) Last BM Date : 07/03/25  Intake/Output from previous day: 12/31 0701 - 01/01 0700 In: 1941 [I.V.:1850; IV Piggyback:91] Out: 1910 [Urine:1900; Blood:10] Intake/Output this shift: No intake/output data recorded.  General appearance: alert, cooperative, and no distress GI: Abdomen soft, nondistended, no percussion tenderness, nontender to palpation; no rigidity, guarding, rebound tenderness; laparoscopic incision sites C/D/I with Dermabond in place  Lab Results:  Recent Labs    07/03/24 0433 07/04/24 0433  WBC 12.3* 9.6  HGB 8.2* 7.9*  HCT 28.2* 28.2*  PLT 355 346   BMET Recent Labs    07/03/24 0433 07/04/24 0433  NA 138 138  K 4.8 4.7  CL 104 103  CO2 19* 25  GLUCOSE 100* 160*  BUN 29* 28*  CREATININE 1.50* 1.57*  CALCIUM  8.9 8.6*   PT/INR Recent Labs    07/02/24 1141 07/03/24 0433  LABPROT 15.1 16.2*  INR 1.1 1.2    Studies/Results: CT L-SPINE NO CHARGE Result Date: 07/02/2024 EXAM: CT OF THE LUMBAR SPINE WITHOUT CONTRAST 07/02/2024 01:48:17 PM TECHNIQUE: CT of the lumbar spine was performed without the administration of intravenous contrast. Multiplanar reformatted images are provided for review. Automated exposure control, iterative reconstruction, and/or weight based adjustment of the mA/kV was utilized to reduce the radiation dose to as low as reasonably achievable. COMPARISON: Of the lumbar spine 08/11/2020  CLINICAL HISTORY: FINDINGS: BONES AND ALIGNMENT: Normal vertebral body heights. No acute fracture or suspicious bone lesion. Normal alignment. Multilevel bulky anterior osteophyte formation. DEGENERATIVE CHANGES: L5-S1 intervertebral disc space narrowing. Posterior disc osteophyte complex at the L5-S1 level. At least moderate left L5-S1 osseous neural foraminal stenosis. SOFT TISSUES: No acute abnormality. IMPRESSION: 1. No acute fracture. Electronically signed by: Morgane Naveau MD 07/02/2024 02:36 PM EST RP Workstation: [id number]   CT T-SPINE NO CHARGE Result Date: 07/02/2024 EXAM: CT THORACIC SPINE WITHOUT CONTRAST 07/02/2024 01:48:17 PM TECHNIQUE: CT of the thoracic spine was performed without the administration of intravenous contrast. Multiplanar reformatted images are provided for review. Automated exposure control, iterative reconstruction, and/or weight based adjustment of the mA/kV was utilized to reduce the radiation dose to as low as reasonably achievable. COMPARISON: CT chest 10/07/2019. CLINICAL HISTORY: FINDINGS: BONES AND ALIGNMENT: Redemonstration of exaggerating kyphotic curvature centered at the thoracic spine T7 level due to chronic compression fracture. T7 compression fracture is chronic, status post kyphoplasty. T6 and T10 chronic fracture with kyphoplasty. No acute fracture identified. No suspicious bone lesion. DEGENERATIVE CHANGES: Multilevel severe degenerative changes of the spine with bulky anterior contiguous osteophytes. Multilevel mild intervertebral disc space narrowing. Severe osseous neural foraminal stenosis at the T7-T8 levels. At least moderate osseous central canal stenosis at the T7 level. SOFT TISSUES: No acute abnormality. IMPRESSION: 1. No acute thoracic spine fracture. 2. Severe osseous neural foraminal stenosis at the T7-T8 levels. 3. At least moderate osseous central canal stenosis at the T7 level. Electronically signed by: Morgane Naveau MD 07/02/2024 02:19 PM EST  RP Workstation: [id number]   CT CHEST ABDOMEN PELVIS WO CONTRAST Result Date: 07/02/2024 EXAM: CT CHEST, ABDOMEN AND PELVIS WITHOUT CONTRAST 07/02/2024 01:48:17 PM TECHNIQUE: CT of the chest, abdomen and pelvis was performed without the administration of intravenous contrast. Multiplanar reformatted images are provided for review. Automated exposure control, iterative reconstruction, and/or weight based adjustment of the mA/kV was utilized to reduce the radiation dose to as low as reasonably achievable. COMPARISON: None available. CLINICAL HISTORY: Polytrauma, blunt. FINDINGS: CHEST: MEDIASTINUM AND LYMPH NODES: Heart and pericardium are unremarkable. The central airways are clear. No mediastinal, hilar or axillary lymphadenopathy. The main pulmonary artery is enlarged with a caliber measuring up to 3.4 cm. At least 3-vessel coronary artery calcifications. The esophagus is unremarkable. LUNGS AND PLEURA: No focal consolidation or pulmonary edema. No pleural effusion or pneumothorax. ABDOMEN AND PELVIS: LIVER: The liver is unremarkable. GALLBLADDER AND BILE DUCTS: Status post cholecystectomy. No biliary ductal dilatation. SPLEEN: No acute abnormality. PANCREAS: No acute abnormality. ADRENAL GLANDS: No acute abnormality. KIDNEYS, URETERS AND BLADDER: Bilateral renal cortical scarring. No stones in the kidneys or ureters. No hydronephrosis. No perinephric or periureteral stranding. Urinary bladder is unremarkable. GI AND BOWEL: Stomach demonstrates no acute abnormality. No small bowel thickening or dilatation. The appendix is enlarged in caliber, measuring up to 1 cm, with associated appendiceal wall thickening. Trace periappendiceal fat stranding. Irregular approximately 15 cm in length bowel thickening of the hepatic flexure suggestive of malignancy. Associated pericolonic fat stranding and prominent benign enlarged pericolonic lymph nodes. There is no bowel obstruction. REPRODUCTIVE ORGANS: No acute abnormality.  PERITONEUM AND RETROPERITONEUM: No ascites. No free air. VASCULATURE: Aorta is normal in caliber with mild to moderate atherosclerotic plaque. ABDOMINAL AND PELVIS LYMPH NODES: Prominent benign enlarged pericolonic lymph nodes associated with the hepatic flexure bowel thickening. REPRODUCTIVE ORGANS: No acute abnormality. BONES AND SOFT TISSUES: No acute displaced rib fracture. Old healed right posterior rib fractures. Old healed posterior left rib fractures. Old healed anterior left rib fractures. No acute displaced fracture or dislocation of the partially visualized bilateral shoulders. No acute displaced fracture of the partially visualized clavicles and scapulae. No acute fracture or dislocation of either hip. No acute displaced fracture or diastasis of the bones of the pelvis. No acute displaced fracture of the sacrum. Please see separately dictated CT thoracolumbar spine 07/02/2024. Degenerative changes of the bilateral hips. Neurostimulator back within the right flank soft tissues with fistulae coursing along the posterior canal and terminating at the T6 to T8 levels. No large soft tissue hematoma. IMPRESSION: 1. Long segment wall thickening of the hepatic flexure with prominent but not enlarged pericolonic fat stranding suggestive of malignancy. Recommend GI consultation. 2. Enlarged appendiceal caliber measuring up to 10 mm with associated mild pericolonic fat stranding. Findings may represent acute appendicitis versus less likely malignancy involvement. 3. No intrathoracic, intra-abdominal, intrapelvic traumatic injury with limited evaluation of this noncontrast study. 4. No acute displaced fracture. Electronically signed by: Morgane Naveau MD 07/02/2024 02:12 PM EST RP Workstation: [id number]   CT CERVICAL SPINE WO CONTRAST Result Date: 07/02/2024 EXAM: CT CERVICAL SPINE WITHOUT CONTRAST 07/02/2024 01:48:17 PM TECHNIQUE: CT of the cervical spine was performed without the administration of intravenous  contrast. Multiplanar reformatted images are provided for review. Automated exposure control, iterative reconstruction, and/or weight based adjustment of the mA/kV was utilized to reduce the radiation dose to as low as reasonably achievable. COMPARISON: None available. CLINICAL HISTORY: Polytrauma, blunt. FINDINGS: BONES AND ALIGNMENT: Occiput to the C4 level posterior surgical hardware. No CT findings  to suggest surgical hardware complication. No acute fracture or traumatic malalignment. No severe osseous or foraminal or central canal stenosis. DEGENERATIVE CHANGES: Multilevel bulky anterior osteophyte formation that appears to be contiguous. Mild multilevel intervertebral disc space narrowing and calcification. SOFT TISSUES: No prevertebral soft tissue swelling. VASCULATURE: Atherosclerotic plaque of the aorta. IMPRESSION: 1. No evidence of acute traumatic injury. 2. Occiput to the C4 level posterior surgical hardware without CT evidence of complication. 3. 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 Electronically signed by: Morgane Naveau MD 07/02/2024 01:56 PM EST RP Workstation: [id number]   CT HEAD WO CONTRAST Result Date: 07/02/2024 EXAM: CT HEAD WITHOUT CONTRAST 07/02/2024 01:48:17 PM TECHNIQUE: CT of the head was performed without the administration of intravenous contrast. Automated exposure control, iterative reconstruction, and/or weight based adjustment of the mA/kV was utilized to reduce the radiation dose to as low as reasonably achievable. COMPARISON: CT head 09/29/2019. CLINICAL HISTORY: Head trauma, moderate-severe. FINDINGS: BRAIN AND VENTRICLES: No acute hemorrhage. No evidence of acute infarct. Nonspecific periventricular and subcortical white matter hypoattenuation, favored to reflect chronic microvascular ischemic changes. Cerebral ventricle sizes are concordant with the degree of cerebral volume loss. No extra-axial collection. No mass effect or midline shift.  Limited evaluation of the cerebellum. ORBITS: Bilateral lens replacement. SINUSES: Mucous retention cyst. SOFT TISSUES AND SKULL: Postoperative changes with fixation hardware along occiput. No CT findings to suggest surgical hardware complication. Fracture originating from the surgical hardware. No acute soft tissue abnormality. VASCULATURE: Atherosclerotic calcifications of carotid siphons and intracranial vertebral arteries. IMPRESSION: 1. No acute intracranial abnormality related to head trauma. 2. Postoperative changes with fixation hardware along the occiput without CT findings to suggest surgical hardware complication. 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  He is status post robotic assisted laparoscopic appendectomy and lysis of adhesions on 12/31.  -Patient with resolution of his leukocytosis on his morning labs -Continue regular diet -PRN pain control and antiemetics -Continue IV Zosyn  while inpatient.  Patient will need 7 to 14 days of total antibiotic treatment for his colitis.  Recommend patient be discharged home on Augmentin . -Patient to follow-up with me in 2 weeks -Appreciate GI recommendations.  They plan to schedule him outpatient for colonoscopy to evaluate hepatic flexure -Patient stable for discharge from general surgery standpoint -Appreciate hospitalist recommendations   LOS: 2  days    Lyndzie Zentz A Lavella Myren 07/04/2024  Note: Portions of this report may have been transcribed using voice recognition software. Every effort has been made to ensure accuracy; however, inadvertent computerized transcription errors may still be present.

## 2024-07-04 NOTE — Evaluation (Signed)
 Physical Therapy Evaluation Patient Details Name: [name] MRN: [id number] DOB: 1945/12/31 Today's Date: 07/04/2024  History of Present Illness  79 y.o. male with history of HTN, OSA on CPAP, CKD 3A, osteoporosis with multiple fractures from falls, wheelchair-bound, chronic pain on chronic opioid therapy, presenting after being found down at home by his wife.      Upon evaluation emergency department, WBCs 18.3, hemoglobin 8.9, lactate 2.1, creatinine 1.60, CT imaging noting no fractures however noting long segment wall thickening at the hepatic flexure of the colon concerning for malignancy as well as enlarged appendix with fat stranding.      Lap Appendectomy to be done on 07/03/2024.  Will need outpatient colonoscopy for evaluation of the colonic mass.   Clinical Impression  Pt was agreeable to completing today's PT evaluation. Pt's wife was present throughout today's evaluation. Pt appears to be at his reported baseline with all bed mobility and transfers. He was modified independent with bed mobility with the HOB elevated and use of the railings. Pt was able to complete a stand pivot transfer with CGA from the bed to the wheelchair and back to the bed. Pt reported feeling good and was able to demonstrate multiple mini-squats while standing. Pt and wife reported that this is his baseline and he does not require any additional skilled physical therapy. Patient discharged to care of nursing for mobility daily as tolerated for length of stay.         If plan is discharge home, recommend the following: A little help with walking and/or transfers;A little help with bathing/dressing/bathroom;Help with stairs or ramp for entrance;Assist for transportation;Assistance with cooking/housework   Can travel by private vehicle        Equipment Recommendations None recommended by PT  Recommendations for Other Services       Functional Status Assessment Patient has not had a recent decline in  their functional status     Precautions / Restrictions Precautions Precautions: Fall Recall of Precautions/Restrictions: Intact Restrictions Weight Bearing Restrictions Per Provider Order: No      Mobility  Bed Mobility Overal bed mobility: Needs Assistance Bed Mobility: Supine to Sit, Sit to Supine     Supine to sit: Modified independent (Device/Increase time), Used rails Sit to supine: Modified independent (Device/Increase time), HOB elevated, Used rails        Transfers Overall transfer level: Needs assistance Equipment used: None Transfers: Sit to/from Stand, Bed to chair/wheelchair/BSC Sit to Stand: Contact guard assist Stand pivot transfers: Contact guard assist              Ambulation/Gait               General Gait Details: Ambulation was not assessed as pt is wheelchair bound at baseline  Stairs            Wheelchair Mobility     Tilt Bed    Modified Rankin (Stroke Patients Only)       Balance Overall balance assessment: Mild deficits observed, not formally tested                                           Pertinent Vitals/Pain Pain Assessment Pain Assessment: No/denies pain    Home Living Family/patient expects to be discharged to:: Private residence Living Arrangements: Spouse/significant other Available Help at Discharge: Family;Available 24 hours/day Type of Home: House Home Access: Ramped entrance  Home Layout: One level Home Equipment: Hand held shower head;Wheelchair - power;Wheelchair - manual;Hospital bed;Grab bars - toilet;Grab bars - tub/shower;Shower seat - built in      Prior Function Prior Level of Function : Needs assist;History of Falls (last six months)       Physical Assist : Mobility (physical) Mobility (physical): Gait   Mobility Comments: Pt reports utilizing a manual wheelchair for most mobility, but will utilize power wheelchair for long distances. Pt reports independence  with all transfers and bed mobility. ADLs Comments: Pt reports independence with his ADL's.     Extremity/Trunk Assessment   Upper Extremity Assessment Upper Extremity Assessment: Overall WFL for tasks assessed    Lower Extremity Assessment Lower Extremity Assessment: Overall WFL for tasks assessed    Cervical / Trunk Assessment Cervical / Trunk Assessment: Normal  Communication   Communication Communication: Impaired Factors Affecting Communication: Hearing impaired    Cognition Arousal: Alert Behavior During Therapy: WFL for tasks assessed/performed   PT - Cognitive impairments: No apparent impairments                         Following commands: Intact       Cueing Cueing Techniques: Verbal cues     General Comments      Exercises     Assessment/Plan    PT Assessment Patient does not need any further PT services  PT Problem List         PT Treatment Interventions      PT Goals (Current goals can be found in the Care Plan section)       Frequency       Co-evaluation               AM-PAC PT 6 Clicks Mobility  Outcome Measure Help needed turning from your back to your side while in a flat bed without using bedrails?: A Little Help needed moving from lying on your back to sitting on the side of a flat bed without using bedrails?: A Little Help needed moving to and from a bed to a chair (including a wheelchair)?: None Help needed standing up from a chair using your arms (e.g., wheelchair or bedside chair)?: None Help needed to walk in hospital room?: Total Help needed climbing 3-5 steps with a railing? : Total 6 Click Score: 16    End of Session Equipment Utilized During Treatment: Gait belt Activity Tolerance: Patient tolerated treatment well Patient left: in bed;with call bell/phone within reach;with bed alarm set;with family/visitor present Nurse Communication: Mobility status PT Visit Diagnosis: History of falling  (Z91.81);Unsteadiness on feet (R26.81);Other abnormalities of gait and mobility (R26.89)    Time: 1435-1455 PT Time Calculation (min) (ACUTE ONLY): 20 min   Charges:   PT Evaluation $PT Eval Low Complexity: 1 Low PT Treatments $Therapeutic Activity: 8-22 mins PT General Charges $$ ACUTE PT VISIT: 1 Visit         Lacinda Fass, PT, DPT  07/04/2024, 3:13 PM

## 2024-07-04 NOTE — Plan of Care (Signed)

## 2024-07-04 NOTE — Plan of Care (Signed)
 Pt is alert and oriented x 3. Pt had trouble urinating. Msg the on call provider B. Jesus NP. Orders for in and out cath received. PT voided approx 100 ml prior to cath. Cath completed and 1000 out. Bag clamped. Additional 100 removed once unclamped.  Problem: Elimination: Goal: Will not experience complications related to urinary retention Outcome: Not Progressing   Problem: Education: Goal: Knowledge of General Education information will improve Description: Including pain rating scale, medication(s)/side effects and non-pharmacologic comfort measures Outcome: Progressing   Problem: Health Behavior/Discharge Planning: Goal: Ability to manage health-related needs will improve Outcome: Progressing   Problem: Clinical Measurements: Goal: Ability to maintain clinical measurements within normal limits will improve Outcome: Progressing Goal: Will remain free from infection Outcome: Progressing Goal: Diagnostic test results will improve Outcome: Progressing Goal: Respiratory complications will improve Outcome: Progressing Goal: Cardiovascular complication will be avoided Outcome: Progressing   Problem: Activity: Goal: Risk for activity intolerance will decrease Outcome: Progressing   Problem: Nutrition: Goal: Adequate nutrition will be maintained Outcome: Progressing   Problem: Coping: Goal: Level of anxiety will decrease Outcome: Progressing   Problem: Elimination: Goal: Will not experience complications related to bowel motility Outcome: Progressing   Problem: Pain Managment: Goal: General experience of comfort will improve and/or be controlled Outcome: Progressing   Problem: Safety: Goal: Ability to remain free from injury will improve Outcome: Progressing   Problem: Skin Integrity: Goal: Risk for impaired skin integrity will decrease Outcome: Progressing   Problem: Education: Goal: Knowledge of the prescribed therapeutic regimen will improve Outcome:  Progressing   Problem: Bowel/Gastric: Goal: Gastrointestinal status for postoperative course will improve Outcome: Progressing   Problem: Cardiac: Goal: Ability to maintain an adequate cardiac output Outcome: Progressing Goal: Will show no evidence of cardiac arrhythmias Outcome: Progressing   Problem: Nutritional: Goal: Will attain and maintain optimal nutritional status Outcome: Progressing   Problem: Neurological: Goal: Will regain or maintain usual level of consciousness Outcome: Progressing   Problem: Clinical Measurements: Goal: Ability to maintain clinical measurements within normal limits Outcome: Progressing Goal: Postoperative complications will be avoided or minimized Outcome: Progressing   Problem: Respiratory: Goal: Will regain and/or maintain adequate ventilation Outcome: Progressing Goal: Respiratory status will improve Outcome: Progressing   Problem: Skin Integrity: Goal: Demonstrates signs of wound healing without infection Outcome: Progressing   Problem: Urinary Elimination: Goal: Will remain free from infection Outcome: Progressing Goal: Ability to achieve and maintain adequate urine output Outcome: Progressing

## 2024-07-08 LAB — SURGICAL PATHOLOGY

## 2024-07-16 ENCOUNTER — Encounter: Payer: Self-pay | Admitting: Surgery

## 2024-07-16 ENCOUNTER — Ambulatory Visit (INDEPENDENT_AMBULATORY_CARE_PROVIDER_SITE_OTHER): Admitting: Surgery

## 2024-07-16 ENCOUNTER — Other Ambulatory Visit: Payer: Self-pay

## 2024-07-16 VITALS — BP 115/71 | HR 76 | Temp 98.4°F | Resp 16

## 2024-07-16 DIAGNOSIS — Z09 Encounter for follow-up examination after completed treatment for conditions other than malignant neoplasm: Secondary | ICD-10-CM

## 2024-07-16 NOTE — Progress Notes (Signed)
"      [facility] Note   HPI:  79 y.o. Male presents to clinic for post-op follow-up status post robotic assisted laparoscopic appendectomy on 08/04/2023.  Since his surgery, the patient has been doing well.  He is tolerating a diet without nausea and vomiting, and moving his bowels without issue.  He denies any acute pain related to his incision sites.  He also denies any issues at his incision sites.  Denies fevers and chills.  Review of Systems:  All other review of systems: otherwise negative   Vital Signs:  BP 115/71   Pulse 76   Temp 98.4 F (36.9 C) (Oral)   Resp 16   SpO2 94%    Physical Exam:  Physical Exam Vitals reviewed.  Constitutional:      Appearance: Normal appearance.  Abdominal:     Comments: Abdomen soft, nondistended, no percussion tenderness, nontender to palpation; no rigidity, guarding, rebound tenderness; laparoscopic incision sites healing well with small amount of skin glue present  Neurological:     Mental Status: He is alert.     Laboratory studies: None  Imaging:  None  Pathology: A. APPENDIX, APPENDECTOMY:  - Early acute appendicitis.  - Fibrous obliteration of the distal lumen.  - Negative for malignancy.   Assessment:  79 y.o. yo Male who presents for follow-up status post robotic assisted laparoscopic appendectomy on 07/03/2024.  Plan:  - Patient doing well since surgery.  Tolerating a diet, moving his bowels, and pain well-controlled - Advised that he can use antibiotic ointment at his remaining incision sites to get the glue off - He is scheduled to follow-up with GI on 1/21.  He will need to undergo colonoscopy to evaluate the hepatic flexure to see if there is a colon mass present.  Advised that he can follow-up with me if needed for further surgical interventions - Follow up with me as needed  All of the above recommendations were discussed with the patient and patient's family, and all of patient's and family's  questions were answered to their expressed satisfaction.  Note: Portions of this report may have been transcribed using voice recognition software. Every effort has been made to ensure accuracy; however, inadvertent computerized transcription errors may still be present.   Dorothyann Brittle, DO [facility] [address] [place], [place] [address] [phone] (office) "

## 2024-07-18 ENCOUNTER — Encounter: Admitting: Surgery

## 2024-07-24 ENCOUNTER — Encounter (INDEPENDENT_AMBULATORY_CARE_PROVIDER_SITE_OTHER): Payer: Self-pay | Admitting: Gastroenterology

## 2024-07-24 ENCOUNTER — Ambulatory Visit (INDEPENDENT_AMBULATORY_CARE_PROVIDER_SITE_OTHER): Admitting: Gastroenterology

## 2024-07-24 VITALS — BP 116/63 | HR 57 | Temp 97.2°F | Ht 72.0 in

## 2024-07-24 DIAGNOSIS — K5903 Drug induced constipation: Secondary | ICD-10-CM | POA: Insufficient documentation

## 2024-07-24 DIAGNOSIS — D509 Iron deficiency anemia, unspecified: Secondary | ICD-10-CM | POA: Diagnosis not present

## 2024-07-24 DIAGNOSIS — R933 Abnormal findings on diagnostic imaging of other parts of digestive tract: Secondary | ICD-10-CM

## 2024-07-24 DIAGNOSIS — K5900 Constipation, unspecified: Secondary | ICD-10-CM

## 2024-07-24 DIAGNOSIS — R9389 Abnormal findings on diagnostic imaging of other specified body structures: Secondary | ICD-10-CM

## 2024-07-24 DIAGNOSIS — D500 Iron deficiency anemia secondary to blood loss (chronic): Secondary | ICD-10-CM | POA: Insufficient documentation

## 2024-07-24 NOTE — Progress Notes (Signed)
 [name] , M.D. Gastroenterology & Hepatology [facility], [place] [facility] Gastroenterology [address] [place], [place] [address] Primary Care Physician: Volanda Saupe, MD [address] [facility] [place] [address]  Chief Complaint: Iron deficiency anemia, abnormal CT, constipation  History of Present Illness:  [name] is a 79 y.o. male with first-degree AV block, HTN, OSA, neuropathy, tremors, ankylosing spondylitis (wheelchair bound), previous heavy alcohol use  who presents for follow-up on abnormal CT imaging concerning for colonic lesion, iron deficiency anemia and constipation  Patient was recently seen in the hospital where he was admitted after a fall, incidentally found to have appendicitis and CT finding of colonic lesion.  Patient underwent appendectomy 07/03/2024. Patient is doing well main complaint remains severe constipation as he would have a bowel movement every 4 to 5 days.  Previously was on Linzess unsure the dosage and was given Movantik  by PCP which patient takes intermittently only.  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 pt has note from [facility] indicating difficulty   First degree AV block    chronic   Gait instability    Hypertension    Multiple falls    OSA (obstructive sleep apnea)    Pain management    Peripheral axonal neuropathy    Peripheral neuropathy 06/06/2018   Tremor     Past Surgical History: Past Surgical History:  Procedure  Laterality Date   ANKLE SURGERY Right    orif   BACK SURGERY     BIOPSY  11/05/2018   Procedure: BIOPSY;  Surgeon: Harvey Margo CROME, MD;  Location: [facility];  Service: Endoscopy;;   CHOLECYSTECTOMY     ESOPHAGOGASTRODUODENOSCOPY (EGD) WITH PROPOFOL  N/A 11/05/2018   Procedure: ESOPHAGOGASTRODUODENOSCOPY (EGD) WITH PROPOFOL ;  Surgeon: Harvey Margo CROME, MD;  Location: [facility];  Service: Endoscopy;  Laterality: N/A;  10:30am-rescheduled to 4/7 @ 7:30am per office   JOINT REPLACEMENT Bilateral    knees   NECK SURGERY     cervical fusion   SPINAL CORD STIMULATOR INSERTION  2017   TOTAL KNEE ARTHROPLASTY     XI ROBOTIC LAPAROSCOPIC ASSISTED APPENDECTOMY N/A 07/03/2024   Procedure: APPENDECTOMY, ROBOT-ASSISTED, LAPAROSCOPIC, LYSIS OF ADHESIONS;  Surgeon: Evonnie Dorothyann LABOR, DO;  Location: [facility] OR[facility];  Service: General;  Laterality: N/A;    Family History: Family History  Problem Relation Age of Onset   Heart disease Mother     Social History:Tobacco Use History[1] Social History   Substance and Sexual Activity  Alcohol Use Yes   Social History   Substance and Sexual Activity  Drug Use No    Allergies: Allergies[2]  Medications: Current Outpatient Medications  Medication Sig Dispense Refill   DULoxetine  (CYMBALTA ) 30 MG capsule Take 60 mg by mouth daily. (Patient taking differently: Take 60 mg by mouth daily. PRN)     acetaminophen  (TYLENOL ) 325 MG tablet Take 2 tablets (650 mg total) by mouth every 4 (four) hours as needed for mild pain (or temp > 37.5 C (99.5  F)). 12 tablet 0   amLODipine  (NORVASC ) 10 MG tablet Take 10 mg by mouth daily.     losartan  (COZAAR ) 25 MG tablet Take 0.5 tablets (12.5 mg total) by mouth daily.     naloxegol  oxalate (MOVANTIK ) 25 MG TABS tablet Take 12.5 mg by mouth daily.     naloxone  (NARCAN ) nasal spray 4 mg/0.1 mL Place 1 spray into the nose once.     oxyCODONE -acetaminophen  (PERCOCET) 10-325 MG tablet Take 1 tablet by mouth every 6 (six)  hours as needed for pain.     pregabalin  (LYRICA ) 150 MG capsule Take 200 mg by mouth 3 (three) times daily.     tamsulosin  (FLOMAX ) 0.4 MG CAPS capsule Take 1 capsule (0.4 mg total) by mouth at bedtime. 30 capsule 2   No current facility-administered medications for this visit.   Facility-Administered Medications Ordered in Other Visits  Medication Dose Route Frequency Provider Last Rate Last Admin   ondansetron  (ZOFRAN ) 4 mg in sodium chloride  0.9 % 50 mL IVPB  4 mg Intravenous Q6H PRN Colon Shove, MD        Review of Systems: GENERAL: negative for malaise, night sweats HEENT: No changes in hearing or vision, no nose bleeds or other nasal problems. NECK: Negative for lumps, goiter, pain and significant neck swelling RESPIRATORY: Negative for cough, wheezing CARDIOVASCULAR: Negative for chest pain, leg swelling, palpitations, orthopnea GI: SEE HPI MUSCULOSKELETAL: Negative for joint pain or swelling, back pain, and muscle pain. SKIN: Negative for lesions, rash HEMATOLOGY Negative for prolonged bleeding, bruising easily, and swollen nodes. ENDOCRINE: Negative for cold or heat intolerance, polyuria, polydipsia and goiter. NEURO: negative for tremor, gait imbalance, syncope and seizures. The remainder of the review of systems is noncontributory.   Physical Exam: There were no vitals taken for this visit. GENERAL: The patient is AO x3, in no acute distress. HEENT: Head is normocephalic and atraumatic. EOMI are intact. Mouth is well hydrated and without lesions. NECK: Supple. No masses LUNGS: Clear to auscultation. No presence of rhonchi/wheezing/rales. Adequate chest expansion HEART: RRR, normal s1 and s2. ABDOMEN: Soft, nontender, no guarding, no peritoneal signs, and nondistended. BS +. No masses.  Imaging/Labs: as above     Latest Ref Rng & Units 07/04/2024    4:33 AM 07/03/2024    4:33 AM 07/02/2024   11:41 AM  CBC  WBC 4.0 - 10.5 K/uL 9.6  12.3  18.3   Hemoglobin 13.0 -  17.0 g/dL 7.9  8.2  8.9   Hematocrit 39.0 - 52.0 % 28.2  28.2  31.0   Platelets 150 - 400 K/uL 346  355  470    Lab Results  Component Value Date   IRON 12 (L) 07/02/2024   TIBC 377 07/02/2024   FERRITIN 33 07/02/2024    I personally reviewed and interpreted the available labs, imaging and endoscopic files.  CT scan demonstrating hepatic flexure wall thickening.  Enlarged appendiceal caliber with pericolic fat stranding suggesting appendicitis   Impression and Plan:  IDREES QUAM is a 79 y.o. male with first-degree AV block, HTN, OSA, neuropathy, tremors, ankylosing spondylitis (wheelchair bound), previous heavy alcohol use  who presents for follow-up on abnormal CT imaging concerning for colonic lesion, iron deficiency anemia and constipation  #Iron deficiency anemia #Abnormal CT finding #Severe constipation   Ferritin 33 iron saturation 3% last hemoglobin 7.9, and CT finding of hepatic flexure thickening with last colonoscopy over 10 years this is concerning and recommend ruling out malignancy  As per ACG  guidelines , bidirectional endoscopy is recommended over iron replacement therapy only.  We will proceed along with Upper endoscopy with small bowel biopsies and Colonoscopy  I had given patient option to either proceed with colonoscopy first and if it negative upper endoscopy will be done at a later date for workup of iron deficiency anemia.  Patient would like to proceed with upper endoscopy and colonoscopy at 1 session to reduce need for anesthesia  Family is concerned about anesthesia related risk due to difficult airway.  I have reached out to Dr. Herschell to assess candidacy of upper endoscopy colonoscopy at [facility] as patient recently had appendectomy done at [facility] without any issues and was able to intubate with a glidescope, was given okay to proceed with procedure here   Patient severe constipation is likely due to opioid-induced and previously on Linzess and  currently taking Movantik  only as needed  I recommend patient to take Movantik  daily and add MiraLAX  twice daily  Regarding iron deficiency patient would benefit from replacement but is unable to tolerate p.o. iron given severe constipation.  Will refer to hematology for iron supplementation  I thoroughly discussed with the patient the procedure, including the risks involved. Patient understands what the procedure involves including the benefits and any risks. Patient understands alternatives to the proposed procedure. Risks including (but not limited to) bleeding, tearing of the lining (perforation), rupture of adjacent organs, problems with heart and lung function, infection, and medication reactions. A small percentage of complications may require surgery, hospitalization, repeat endoscopic procedure, and/or transfusion.  Patient understood and agreed.    All questions were answered.      [name] Neil Hattie Aguinaldo, MD Gastroenterology and Hepatology [facility] Gastroenterology   This chart has been completed using [facility] [employer] Dictation software, and while attempts have been made to ensure accuracy , certain words and phrases may not be transcribed as intended      [1]  Social History Tobacco Use  Smoking Status Never  Smokeless Tobacco Never  [2]  Allergies Allergen Reactions   Codeine Nausea Only, Nausea And Vomiting and Other (See Comments)    Other Reaction(s): Unknown

## 2024-07-24 NOTE — Patient Instructions (Signed)
 It was very nice to meet you today, as dicussed with will plan for the following :  1) Upper endoscopy and colonoscopy   2) Hematology for iron infusion  3) Ensure adequate fluid intake: Aim for 8 glasses of water  daily. Take Miralax  twice a day  Continue Movantik  daily

## 2024-07-25 ENCOUNTER — Telehealth (INDEPENDENT_AMBULATORY_CARE_PROVIDER_SITE_OTHER): Payer: Self-pay

## 2024-07-25 NOTE — Telephone Encounter (Signed)
 Called and spoke to patients wife, she stated that she will call me once she is home with the patient and a calendar to schedule his procedure.

## 2024-07-31 MED ORDER — PEG 3350-KCL-NA BICARB-NACL 420 G PO SOLR: 4000.0000 mL | Freq: Once | ORAL | 0 refills | Status: AC

## 2024-07-31 NOTE — Telephone Encounter (Signed)
 PA on [facility] for TCS/EGD: Notification or Prior Authorization is not required for the requested services You are not required to submit a notification/prior authorization based on the information provided.  Decision ID #: [id number]

## 2024-07-31 NOTE — Addendum Note (Signed)
 Addended by: DALLIE LIONEL RAMAN on: 07/31/2024 08:19 AM   Modules accepted: Orders

## 2024-07-31 NOTE — Telephone Encounter (Signed)
 Spoke with patient's wife Levorn (on [place]) and scheduled TCS/EGD for 08/19/2024 at 1:30pm. Rx sent to pharmacy. Instructions sent to [facility] and mailed.

## 2024-08-15 ENCOUNTER — Encounter (HOSPITAL_COMMUNITY)

## 2024-08-19 ENCOUNTER — Encounter (HOSPITAL_COMMUNITY): Payer: Self-pay

## 2024-08-19 ENCOUNTER — Ambulatory Visit (HOSPITAL_COMMUNITY): Admit: 2024-08-19 | Admitting: Gastroenterology
